# Patient Record
Sex: Male | Born: 1945 | Race: White | Hispanic: No | Marital: Married | Smoking: Former smoker
Health system: Southern US, Community
[De-identification: ages and names within clinical notes are randomized; demographics above are authoritative.]

## PROBLEM LIST (undated history)

## (undated) DIAGNOSIS — G4700 Insomnia, unspecified: Secondary | ICD-10-CM

## (undated) DIAGNOSIS — S43429A Sprain of unspecified rotator cuff capsule, initial encounter: Secondary | ICD-10-CM

## (undated) DIAGNOSIS — J301 Allergic rhinitis due to pollen: Secondary | ICD-10-CM

## (undated) DIAGNOSIS — R143 Flatulence: Secondary | ICD-10-CM

## (undated) DIAGNOSIS — K449 Diaphragmatic hernia without obstruction or gangrene: Secondary | ICD-10-CM

## (undated) DIAGNOSIS — R51 Headache: Secondary | ICD-10-CM

## (undated) DIAGNOSIS — F17200 Nicotine dependence, unspecified, uncomplicated: Secondary | ICD-10-CM

## (undated) DIAGNOSIS — Z0000 Encounter for general adult medical examination without abnormal findings: Secondary | ICD-10-CM

## (undated) DIAGNOSIS — B028 Zoster with other complications: Secondary | ICD-10-CM

## (undated) DIAGNOSIS — R7303 Prediabetes: Secondary | ICD-10-CM

## (undated) DIAGNOSIS — L919 Hypertrophic disorder of the skin, unspecified: Secondary | ICD-10-CM

## (undated) DIAGNOSIS — R21 Rash and other nonspecific skin eruption: Secondary | ICD-10-CM

## (undated) DIAGNOSIS — E875 Hyperkalemia: Secondary | ICD-10-CM

## (undated) DIAGNOSIS — E781 Pure hyperglyceridemia: Secondary | ICD-10-CM

## (undated) DIAGNOSIS — G4730 Sleep apnea, unspecified: Secondary | ICD-10-CM

## (undated) DIAGNOSIS — M545 Low back pain, unspecified: Secondary | ICD-10-CM

## (undated) DIAGNOSIS — R972 Elevated prostate specific antigen [PSA]: Secondary | ICD-10-CM

## (undated) DIAGNOSIS — G4489 Other headache syndrome: Secondary | ICD-10-CM

## (undated) DIAGNOSIS — L909 Atrophic disorder of skin, unspecified: Secondary | ICD-10-CM

## (undated) DIAGNOSIS — I10 Essential (primary) hypertension: Secondary | ICD-10-CM

## (undated) DIAGNOSIS — I8390 Asymptomatic varicose veins of unspecified lower extremity: Secondary | ICD-10-CM

## (undated) DIAGNOSIS — M1990 Unspecified osteoarthritis, unspecified site: Secondary | ICD-10-CM

## (undated) DIAGNOSIS — R351 Nocturia: Secondary | ICD-10-CM

## (undated) DIAGNOSIS — K644 Residual hemorrhoidal skin tags: Secondary | ICD-10-CM

## (undated) DIAGNOSIS — E669 Obesity, unspecified: Secondary | ICD-10-CM

## (undated) DIAGNOSIS — R142 Eructation: Secondary | ICD-10-CM

## (undated) DIAGNOSIS — I70209 Unspecified atherosclerosis of native arteries of extremities, unspecified extremity: Secondary | ICD-10-CM

## (undated) DIAGNOSIS — IMO0001 Reserved for inherently not codable concepts without codable children: Secondary | ICD-10-CM

## (undated) DIAGNOSIS — R7989 Other specified abnormal findings of blood chemistry: Secondary | ICD-10-CM

## (undated) DIAGNOSIS — F40240 Claustrophobia: Secondary | ICD-10-CM

## (undated) DIAGNOSIS — L253 Unspecified contact dermatitis due to other chemical products: Secondary | ICD-10-CM

## (undated) DIAGNOSIS — A159 Respiratory tuberculosis unspecified: Secondary | ICD-10-CM

## (undated) DIAGNOSIS — I714 Abdominal aortic aneurysm, without rupture, unspecified: Secondary | ICD-10-CM

## (undated) DIAGNOSIS — R141 Gas pain: Secondary | ICD-10-CM

## (undated) DIAGNOSIS — F411 Generalized anxiety disorder: Secondary | ICD-10-CM

## (undated) DIAGNOSIS — H269 Unspecified cataract: Secondary | ICD-10-CM

## (undated) HISTORY — DX: Unspecified contact dermatitis due to other chemical products: L25.3

## (undated) HISTORY — DX: Gas pain: R14.1

## (undated) HISTORY — PX: OTHER SURGICAL HISTORY: SHX169

## (undated) HISTORY — DX: Asymptomatic varicose veins of unspecified lower extremity: I83.90

## (undated) HISTORY — DX: Sprain of unspecified rotator cuff capsule, initial encounter: S43.429A

## (undated) HISTORY — DX: Generalized anxiety disorder: F41.1

## (undated) HISTORY — DX: Residual hemorrhoidal skin tags: K64.4

## (undated) HISTORY — DX: Sleep apnea, unspecified: G47.30

## (undated) HISTORY — DX: Unspecified atherosclerosis of native arteries of extremities, unspecified extremity: I70.209

## (undated) HISTORY — DX: Hyperkalemia: E87.5

## (undated) HISTORY — PX: VARICOSE VEIN SURGERY: SHX832

## (undated) HISTORY — DX: Nicotine dependence, unspecified, uncomplicated: F17.200

## (undated) HISTORY — DX: Pure hyperglyceridemia: E78.1

## (undated) HISTORY — DX: Other specified abnormal findings of blood chemistry: R79.89

## (undated) HISTORY — DX: Eructation: R14.2

## (undated) HISTORY — DX: Essential (primary) hypertension: I10

## (undated) HISTORY — DX: Reserved for inherently not codable concepts without codable children: IMO0001

## (undated) HISTORY — DX: Other headache syndrome: G44.89

## (undated) HISTORY — DX: Atrophic disorder of skin, unspecified: L90.9

## (undated) HISTORY — DX: Abdominal aortic aneurysm, without rupture, unspecified: I71.40

## (undated) HISTORY — DX: Encounter for general adult medical examination without abnormal findings: Z00.00

## (undated) HISTORY — DX: Insomnia, unspecified: G47.00

## (undated) HISTORY — PX: HEMORRHOID SURGERY: SHX153

## (undated) HISTORY — DX: Zoster with other complications: B02.8

## (undated) HISTORY — DX: Flatulence: R14.3

## (undated) HISTORY — DX: Allergic rhinitis due to pollen: J30.1

## (undated) HISTORY — DX: Nocturia: R35.1

## (undated) HISTORY — DX: Rash and other nonspecific skin eruption: R21

## (undated) HISTORY — DX: Hypertrophic disorder of the skin, unspecified: L91.9

## (undated) HISTORY — DX: Low back pain: M54.5

## (undated) HISTORY — DX: Headache: R51

## (undated) HISTORY — DX: Obesity, unspecified: E66.9

## (undated) HISTORY — DX: Unspecified osteoarthritis, unspecified site: M19.90

## (undated) HISTORY — DX: Low back pain, unspecified: M54.50

## (undated) HISTORY — DX: Diaphragmatic hernia without obstruction or gangrene: K44.9

## (undated) HISTORY — PX: PROSTATE BIOPSY: SHX241

## (undated) HISTORY — DX: Unspecified cataract: H26.9

---

## 1998-02-12 ENCOUNTER — Emergency Department (HOSPITAL_COMMUNITY): Admission: EM | Admit: 1998-02-12 | Discharge: 1998-02-12 | Payer: Self-pay

## 1998-02-15 ENCOUNTER — Emergency Department (HOSPITAL_COMMUNITY): Admission: EM | Admit: 1998-02-15 | Discharge: 1998-02-15 | Payer: Self-pay | Admitting: Emergency Medicine

## 1998-02-16 ENCOUNTER — Encounter: Admission: RE | Admit: 1998-02-16 | Discharge: 1998-02-16 | Payer: Self-pay | Admitting: *Deleted

## 1998-02-17 ENCOUNTER — Emergency Department (HOSPITAL_COMMUNITY): Admission: EM | Admit: 1998-02-17 | Discharge: 1998-02-17 | Payer: Self-pay | Admitting: Emergency Medicine

## 1998-02-19 ENCOUNTER — Emergency Department (HOSPITAL_COMMUNITY): Admission: EM | Admit: 1998-02-19 | Discharge: 1998-02-19 | Payer: Self-pay | Admitting: Emergency Medicine

## 2005-01-03 ENCOUNTER — Emergency Department (HOSPITAL_COMMUNITY): Admission: EM | Admit: 2005-01-03 | Discharge: 2005-01-03 | Payer: Self-pay | Admitting: Emergency Medicine

## 2005-04-25 ENCOUNTER — Emergency Department (HOSPITAL_COMMUNITY): Admission: EM | Admit: 2005-04-25 | Discharge: 2005-04-25 | Payer: Self-pay | Admitting: Emergency Medicine

## 2008-05-18 ENCOUNTER — Emergency Department: Payer: Self-pay | Admitting: Emergency Medicine

## 2010-08-31 LAB — HM COLONOSCOPY

## 2011-07-26 DIAGNOSIS — E781 Pure hyperglyceridemia: Secondary | ICD-10-CM | POA: Diagnosis not present

## 2011-07-26 DIAGNOSIS — I10 Essential (primary) hypertension: Secondary | ICD-10-CM | POA: Diagnosis not present

## 2011-07-26 DIAGNOSIS — Z125 Encounter for screening for malignant neoplasm of prostate: Secondary | ICD-10-CM | POA: Diagnosis not present

## 2011-07-31 DIAGNOSIS — F411 Generalized anxiety disorder: Secondary | ICD-10-CM | POA: Diagnosis not present

## 2011-07-31 DIAGNOSIS — E875 Hyperkalemia: Secondary | ICD-10-CM | POA: Diagnosis not present

## 2011-07-31 DIAGNOSIS — E781 Pure hyperglyceridemia: Secondary | ICD-10-CM | POA: Diagnosis not present

## 2011-07-31 DIAGNOSIS — R351 Nocturia: Secondary | ICD-10-CM | POA: Diagnosis not present

## 2011-09-06 DIAGNOSIS — H2510 Age-related nuclear cataract, unspecified eye: Secondary | ICD-10-CM | POA: Diagnosis not present

## 2011-09-06 DIAGNOSIS — H5200 Hypermetropia, unspecified eye: Secondary | ICD-10-CM | POA: Diagnosis not present

## 2011-10-26 DIAGNOSIS — I10 Essential (primary) hypertension: Secondary | ICD-10-CM | POA: Diagnosis not present

## 2011-10-26 DIAGNOSIS — E875 Hyperkalemia: Secondary | ICD-10-CM | POA: Diagnosis not present

## 2011-10-26 DIAGNOSIS — E781 Pure hyperglyceridemia: Secondary | ICD-10-CM | POA: Diagnosis not present

## 2011-10-30 DIAGNOSIS — I10 Essential (primary) hypertension: Secondary | ICD-10-CM | POA: Diagnosis not present

## 2011-10-30 DIAGNOSIS — F17200 Nicotine dependence, unspecified, uncomplicated: Secondary | ICD-10-CM | POA: Diagnosis not present

## 2011-10-30 DIAGNOSIS — L919 Hypertrophic disorder of the skin, unspecified: Secondary | ICD-10-CM | POA: Diagnosis not present

## 2011-10-30 DIAGNOSIS — E781 Pure hyperglyceridemia: Secondary | ICD-10-CM | POA: Diagnosis not present

## 2011-10-30 DIAGNOSIS — L909 Atrophic disorder of skin, unspecified: Secondary | ICD-10-CM | POA: Diagnosis not present

## 2012-01-30 DIAGNOSIS — E781 Pure hyperglyceridemia: Secondary | ICD-10-CM | POA: Diagnosis not present

## 2012-01-30 DIAGNOSIS — I10 Essential (primary) hypertension: Secondary | ICD-10-CM | POA: Diagnosis not present

## 2012-02-02 DIAGNOSIS — E781 Pure hyperglyceridemia: Secondary | ICD-10-CM | POA: Diagnosis not present

## 2012-02-02 DIAGNOSIS — I10 Essential (primary) hypertension: Secondary | ICD-10-CM | POA: Diagnosis not present

## 2012-02-02 DIAGNOSIS — IMO0001 Reserved for inherently not codable concepts without codable children: Secondary | ICD-10-CM | POA: Diagnosis not present

## 2012-02-02 DIAGNOSIS — R7989 Other specified abnormal findings of blood chemistry: Secondary | ICD-10-CM | POA: Diagnosis not present

## 2012-05-07 DIAGNOSIS — F17200 Nicotine dependence, unspecified, uncomplicated: Secondary | ICD-10-CM | POA: Diagnosis not present

## 2012-05-07 DIAGNOSIS — Z0000 Encounter for general adult medical examination without abnormal findings: Secondary | ICD-10-CM | POA: Diagnosis not present

## 2012-05-07 DIAGNOSIS — E781 Pure hyperglyceridemia: Secondary | ICD-10-CM | POA: Diagnosis not present

## 2012-05-07 DIAGNOSIS — R7989 Other specified abnormal findings of blood chemistry: Secondary | ICD-10-CM | POA: Diagnosis not present

## 2012-05-07 DIAGNOSIS — E875 Hyperkalemia: Secondary | ICD-10-CM | POA: Diagnosis not present

## 2012-05-07 DIAGNOSIS — I10 Essential (primary) hypertension: Secondary | ICD-10-CM | POA: Diagnosis not present

## 2012-05-09 DIAGNOSIS — E781 Pure hyperglyceridemia: Secondary | ICD-10-CM | POA: Diagnosis not present

## 2012-05-09 DIAGNOSIS — E669 Obesity, unspecified: Secondary | ICD-10-CM | POA: Diagnosis not present

## 2012-05-09 DIAGNOSIS — F411 Generalized anxiety disorder: Secondary | ICD-10-CM | POA: Diagnosis not present

## 2012-05-09 DIAGNOSIS — Z23 Encounter for immunization: Secondary | ICD-10-CM | POA: Diagnosis not present

## 2012-08-01 DIAGNOSIS — I10 Essential (primary) hypertension: Secondary | ICD-10-CM | POA: Diagnosis not present

## 2012-08-01 DIAGNOSIS — R7989 Other specified abnormal findings of blood chemistry: Secondary | ICD-10-CM | POA: Diagnosis not present

## 2012-08-01 DIAGNOSIS — E781 Pure hyperglyceridemia: Secondary | ICD-10-CM | POA: Diagnosis not present

## 2012-08-05 DIAGNOSIS — F17200 Nicotine dependence, unspecified, uncomplicated: Secondary | ICD-10-CM | POA: Diagnosis not present

## 2012-08-05 DIAGNOSIS — I10 Essential (primary) hypertension: Secondary | ICD-10-CM | POA: Diagnosis not present

## 2012-08-05 DIAGNOSIS — G4730 Sleep apnea, unspecified: Secondary | ICD-10-CM | POA: Diagnosis not present

## 2012-08-05 DIAGNOSIS — E781 Pure hyperglyceridemia: Secondary | ICD-10-CM | POA: Diagnosis not present

## 2012-08-19 DIAGNOSIS — M79609 Pain in unspecified limb: Secondary | ICD-10-CM | POA: Diagnosis not present

## 2012-09-06 DIAGNOSIS — H2510 Age-related nuclear cataract, unspecified eye: Secondary | ICD-10-CM | POA: Diagnosis not present

## 2012-09-06 DIAGNOSIS — H524 Presbyopia: Secondary | ICD-10-CM | POA: Diagnosis not present

## 2012-09-06 DIAGNOSIS — H5200 Hypermetropia, unspecified eye: Secondary | ICD-10-CM | POA: Diagnosis not present

## 2012-09-06 DIAGNOSIS — H0289 Other specified disorders of eyelid: Secondary | ICD-10-CM | POA: Diagnosis not present

## 2012-09-09 DIAGNOSIS — I739 Peripheral vascular disease, unspecified: Secondary | ICD-10-CM | POA: Diagnosis not present

## 2012-09-16 DIAGNOSIS — F17200 Nicotine dependence, unspecified, uncomplicated: Secondary | ICD-10-CM | POA: Diagnosis not present

## 2012-09-16 DIAGNOSIS — R7989 Other specified abnormal findings of blood chemistry: Secondary | ICD-10-CM | POA: Diagnosis not present

## 2012-09-16 DIAGNOSIS — E781 Pure hyperglyceridemia: Secondary | ICD-10-CM | POA: Diagnosis not present

## 2012-10-22 ENCOUNTER — Encounter: Payer: Self-pay | Admitting: Internal Medicine

## 2012-10-24 ENCOUNTER — Other Ambulatory Visit: Payer: Self-pay | Admitting: *Deleted

## 2012-10-24 ENCOUNTER — Other Ambulatory Visit: Payer: Medicare Other

## 2012-10-24 DIAGNOSIS — R7989 Other specified abnormal findings of blood chemistry: Secondary | ICD-10-CM

## 2012-10-24 DIAGNOSIS — E538 Deficiency of other specified B group vitamins: Secondary | ICD-10-CM

## 2012-10-24 DIAGNOSIS — E875 Hyperkalemia: Secondary | ICD-10-CM

## 2012-10-24 DIAGNOSIS — I10 Essential (primary) hypertension: Secondary | ICD-10-CM

## 2012-10-24 DIAGNOSIS — IMO0001 Reserved for inherently not codable concepts without codable children: Secondary | ICD-10-CM

## 2012-10-24 DIAGNOSIS — E781 Pure hyperglyceridemia: Secondary | ICD-10-CM

## 2012-10-24 DIAGNOSIS — R5381 Other malaise: Secondary | ICD-10-CM

## 2012-10-25 ENCOUNTER — Encounter: Payer: Self-pay | Admitting: *Deleted

## 2012-10-25 LAB — COMPREHENSIVE METABOLIC PANEL
ALT: 12 IU/L (ref 0–44)
AST: 13 IU/L (ref 0–40)
Albumin/Globulin Ratio: 1.7 (ref 1.1–2.5)
Albumin: 4.3 g/dL (ref 3.6–4.8)
Alkaline Phosphatase: 83 IU/L (ref 39–117)
BUN/Creatinine Ratio: 14 (ref 10–22)
BUN: 11 mg/dL (ref 8–27)
CO2: 22 mmol/L (ref 19–28)
Calcium: 9.6 mg/dL (ref 8.6–10.2)
Chloride: 102 mmol/L (ref 97–108)
Creatinine, Ser: 0.79 mg/dL (ref 0.76–1.27)
GFR calc Af Amer: 107 mL/min/{1.73_m2} (ref >59)
GFR calc non Af Amer: 93 mL/min/{1.73_m2} (ref >59)
Globulin, Total: 2.5 g/dL (ref 1.5–4.5)
Glucose: 112 mg/dL — ABNORMAL HIGH (ref 65–99)
Potassium: 4.5 mmol/L (ref 3.5–5.2)
Sodium: 138 mmol/L (ref 134–144)
Total Bilirubin: 0.4 mg/dL (ref 0.0–1.2)
Total Protein: 6.8 g/dL (ref 6.0–8.5)

## 2012-10-25 LAB — LIPID PANEL
Chol/HDL Ratio: 4.4 ratio units (ref 0.0–5.0)
Cholesterol, Total: 144 mg/dL (ref 100–199)
HDL: 33 mg/dL — ABNORMAL LOW (ref >39)
LDL Calculated: 79 mg/dL (ref 0–99)
Triglycerides: 158 mg/dL — ABNORMAL HIGH (ref 0–149)
VLDL Cholesterol Cal: 32 mg/dL (ref 5–40)

## 2012-10-25 LAB — HEMOGLOBIN A1C
Est. average glucose Bld gHb Est-mCnc: 120 mg/dL
Hgb A1c MFr Bld: 5.8 % — ABNORMAL HIGH (ref 4.8–5.6)

## 2012-10-28 ENCOUNTER — Encounter: Payer: Self-pay | Admitting: Geriatric Medicine

## 2012-10-28 ENCOUNTER — Ambulatory Visit (INDEPENDENT_AMBULATORY_CARE_PROVIDER_SITE_OTHER): Payer: Medicare Other | Admitting: Internal Medicine

## 2012-10-28 ENCOUNTER — Encounter: Payer: Self-pay | Admitting: Internal Medicine

## 2012-10-28 VITALS — BP 118/70 | HR 93 | Temp 97.9°F | Resp 20 | Ht 70.0 in | Wt 222.0 lb

## 2012-10-28 DIAGNOSIS — F17200 Nicotine dependence, unspecified, uncomplicated: Secondary | ICD-10-CM | POA: Diagnosis not present

## 2012-10-28 DIAGNOSIS — E782 Mixed hyperlipidemia: Secondary | ICD-10-CM | POA: Insufficient documentation

## 2012-10-28 DIAGNOSIS — M19049 Primary osteoarthritis, unspecified hand: Secondary | ICD-10-CM | POA: Diagnosis not present

## 2012-10-28 DIAGNOSIS — I10 Essential (primary) hypertension: Secondary | ICD-10-CM | POA: Diagnosis not present

## 2012-10-28 DIAGNOSIS — R7309 Other abnormal glucose: Secondary | ICD-10-CM

## 2012-10-28 DIAGNOSIS — E785 Hyperlipidemia, unspecified: Secondary | ICD-10-CM | POA: Diagnosis not present

## 2012-10-28 DIAGNOSIS — M189 Osteoarthritis of first carpometacarpal joint, unspecified: Secondary | ICD-10-CM

## 2012-10-28 DIAGNOSIS — E669 Obesity, unspecified: Secondary | ICD-10-CM

## 2012-10-28 DIAGNOSIS — R739 Hyperglycemia, unspecified: Secondary | ICD-10-CM

## 2012-10-28 NOTE — Progress Notes (Signed)
Patient ID: David [name], male   DOB: Sep 12, 1945, 67 y.o.   MRN: [id number] Code Status: full code  Allergies  Allergen Reactions  . Celebrex (Celecoxib)   . Erythromycin   . Oxycontin (Oxycodone Hcl)   . Penicillins   . Tetracyclines & Related     Chief Complaint  Patient presents with  . follow up on medication changes    HPI: Patient is a 67 y.o. white male seen in the office today for medication f/u.  Is doing ok as far as he knows.    Still smoking.  Wants to quit faster.  Previously tried patches.  Using bupropion, but not helping much.  Is worried about trying chantix b/c his mood is not so good as it is.    Now on statin in place of triglyceride med.  Tolerating ok.  TG med was too expensive.  Left thumb joint acting up.  Cracks joints of hands frequently--advised against.  Discussed using heat and ice alternating and stretching.  Has had allergy to celebrex previously, so recommended he mostly use tylenol ES for pain.    Says will increase activity due to mowing now.  Encouraged walking.    Vision unchanged and glasses have not needed to be changed.   Just went last month.  Review of Systems:  Review of Systems  Constitutional: Negative for weight loss.       Gaining weight  Eyes: Negative for blurred vision.  Respiratory: Negative for shortness of breath.   Cardiovascular: Negative for chest pain.  Gastrointestinal: Negative for constipation.  Musculoskeletal: Positive for joint pain. Negative for falls.  Neurological: Negative for dizziness and headaches.  Psychiatric/Behavioral: Positive for depression. Negative for memory loss. The patient is nervous/anxious.      Past Medical History  Diagnosis Date  . Atherosclerosis of native arteries of the extremities, unspecified   . Myalgia and myositis, unspecified   . Unspecified hypertrophic and atrophic condition of skin   . Hyperpotassemia   . Nocturia   . Rotator cuff (capsule) sprain   . Anxiety state,  unspecified   . Contact dermatitis and other eczema due to other chemical products   . Insomnia, unspecified   . Herpes zoster with unspecified complication   . Lumbago   . Lumbago   . Pure hyperglyceridemia   . Other abnormal blood chemistry   . Obesity, unspecified   . Tobacco use disorder   . Other headache syndromes   . Unspecified essential hypertension   . Asymptomatic varicose veins   . Hemorrhoids, external   . Allergic rhinitis due to pollen   . 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 Osteoarthrosis, unspecified whether generalized or localized, unspecified site   . Unspecified sleep apnea   . Rash and other nonspecific skin eruption   . Headache   . Flatulence, eructation, and gas pain   . Routine general medical examination at a health care facility    No past surgical history on file. Social History:   reports that he has been smoking Cigarettes.  He has a 55 pack-year smoking history. He does not have any smokeless tobacco history on file. His alcohol and drug histories are not on file.  Family History  Problem Relation Age of Onset  . Diabetes Mother   . Heart disease Mother   . Stroke Mother   . Stroke Father     Medications: Patient's Medications  New Prescriptions   No medications on file  Previous Medications   BUPROPION (WELLBUTRIN XL) 150 MG 24 HR TABLET    Take 150 mg by mouth daily. Take one tablet twice a day for smoking cessation   MULTIPLE VITAMINS-MINERALS (MENS DAILY FORMULA/LYCOPENE) CAPS    Take by mouth. Take one tablet once a day   SIMVASTATIN (ZOCOR) 10 MG TABLET    Take 10 mg by mouth at bedtime. Take one tablet once a day for cholesterol  Modified Medications   No medications on file  Discontinued Medications   No medications on file   Physical Exam:  Filed Vitals:   10/28/12 1534  BP: 118/70  Pulse: 93  Temp: 97.9 F (36.6 C)  TempSrc: Oral  Resp: 20  Height: 5\' 10"  (1.778 m)  Weight: 222 lb  (100.699 kg)  SpO2: 95%  Physical Exam  Constitutional: He is oriented to person, place, and time. He appears well-developed and well-nourished. No distress.  Overweight white male, chronically diaphoretic  HENT:  Head: Normocephalic and atraumatic.  Eyes: EOM are normal. Pupils are equal, round, and reactive to light.  Cardiovascular: Normal rate, regular rhythm, normal heart sounds and intact distal pulses.   Pulmonary/Chest: Effort normal. No respiratory distress.  Some scattered rhonchi  Abdominal: Soft. Bowel sounds are normal. He exhibits no distension.  Musculoskeletal: Normal range of motion. He exhibits tenderness. He exhibits no edema.  Of thumb joints  Neurological: He is alert and oriented to person, place, and time. No cranial nerve deficit.  Skin: Skin is warm and dry.       Labs reviewed: Basic Metabolic Panel:  Recent Labs  16/10/96 1226  NA 138  K 4.5  CL 102  CO2 22  GLUCOSE 112*  BUN 11  CREATININE 0.79  CALCIUM 9.6   Liver Function Tests:  Recent Labs  10/24/12 1226  AST 13  ALT 12  ALKPHOS 83  BILITOT 0.4  PROT 6.8   No results found for this basename: LIPASE, AMYLASE,  in the last 8760 hours No results found for this basename: AMMONIA,  in the last 8760 hours CBC: No results found for this basename: WBC, NEUTROABS, HGB, HCT, MCV, PLT,  in the last 8760 hours Lipid Panel:  Recent Labs  10/24/12 1226  HDL 33*  LDLCALC 79  TRIG 045*  CHOLHDL 4.4   Assessment/Plan 1. Hyperlipidemia LDL goal < 100 -need to f/u lab due to change to statin from fenofibrate due to cost - Lipid panel; Future  2. Tobacco use disorder --encouraged slow taper off cigarettes, afraid of chantix and wellbutrin is not helping  3. Essential hypertension, benign - Basic metabolic panel; Future - goal is <140/90  4. Osteoarthritis of basilar joint of thumb -see hpi for plan -if not getting better, could refer to ortho or rheumatology for injection in small  joints, but I cannot inject joints this small  5. Obesity, unspecified -diet, exercising discussed in great detail due to weight gain and high cholesterol - CBC with Differential; Future - Hemoglobin A1c; Future  6. Hyperglycemia -noted on fasting glucose levels - Hemoglobin A1c; Future  Labs/tests ordered:  Cbc, hba1c, flp, bmp

## 2012-10-28 NOTE — Patient Instructions (Signed)
Walk 20 mins on all days that end in day.   :)

## 2012-11-25 ENCOUNTER — Other Ambulatory Visit: Payer: Self-pay

## 2012-12-03 ENCOUNTER — Other Ambulatory Visit: Payer: Self-pay | Admitting: Internal Medicine

## 2013-01-12 ENCOUNTER — Other Ambulatory Visit: Payer: Self-pay | Admitting: Internal Medicine

## 2013-01-28 ENCOUNTER — Other Ambulatory Visit: Payer: Self-pay | Admitting: Internal Medicine

## 2013-01-28 ENCOUNTER — Other Ambulatory Visit: Payer: Medicare Other

## 2013-01-28 DIAGNOSIS — R7989 Other specified abnormal findings of blood chemistry: Secondary | ICD-10-CM | POA: Diagnosis not present

## 2013-01-28 DIAGNOSIS — E669 Obesity, unspecified: Secondary | ICD-10-CM | POA: Diagnosis not present

## 2013-01-28 DIAGNOSIS — E781 Pure hyperglyceridemia: Secondary | ICD-10-CM | POA: Diagnosis not present

## 2013-01-28 DIAGNOSIS — E875 Hyperkalemia: Secondary | ICD-10-CM | POA: Diagnosis not present

## 2013-01-31 ENCOUNTER — Ambulatory Visit (INDEPENDENT_AMBULATORY_CARE_PROVIDER_SITE_OTHER): Payer: Medicare Other | Admitting: Internal Medicine

## 2013-01-31 ENCOUNTER — Encounter: Payer: Self-pay | Admitting: Internal Medicine

## 2013-01-31 VITALS — BP 144/86 | HR 91 | Temp 98.3°F | Resp 14 | Ht 70.0 in | Wt 222.2 lb

## 2013-01-31 DIAGNOSIS — J309 Allergic rhinitis, unspecified: Secondary | ICD-10-CM

## 2013-01-31 DIAGNOSIS — H1010 Acute atopic conjunctivitis, unspecified eye: Secondary | ICD-10-CM | POA: Diagnosis not present

## 2013-01-31 DIAGNOSIS — M7581 Other shoulder lesions, right shoulder: Secondary | ICD-10-CM

## 2013-01-31 DIAGNOSIS — M67919 Unspecified disorder of synovium and tendon, unspecified shoulder: Secondary | ICD-10-CM

## 2013-01-31 DIAGNOSIS — E785 Hyperlipidemia, unspecified: Secondary | ICD-10-CM

## 2013-01-31 DIAGNOSIS — I10 Essential (primary) hypertension: Secondary | ICD-10-CM | POA: Diagnosis not present

## 2013-01-31 DIAGNOSIS — F17200 Nicotine dependence, unspecified, uncomplicated: Secondary | ICD-10-CM | POA: Diagnosis not present

## 2013-01-31 DIAGNOSIS — H1013 Acute atopic conjunctivitis, bilateral: Secondary | ICD-10-CM

## 2013-01-31 DIAGNOSIS — R7309 Other abnormal glucose: Secondary | ICD-10-CM

## 2013-01-31 DIAGNOSIS — E669 Obesity, unspecified: Secondary | ICD-10-CM

## 2013-01-31 DIAGNOSIS — R739 Hyperglycemia, unspecified: Secondary | ICD-10-CM

## 2013-01-31 MED ORDER — CETIRIZINE HCL 10 MG PO TABS: 10.0000 mg | ORAL_TABLET | Freq: Every day | ORAL | Status: DC

## 2013-01-31 MED ORDER — OLOPATADINE HCL 0.2 % OP SOLN: 1.0000 [drp] | Freq: Every day | OPHTHALMIC | Status: DC

## 2013-01-31 NOTE — Patient Instructions (Addendum)
Please call us with clarification of your wellbutrin/bupropion dose and timing.    I gave you drops for your allergies and an allergy pill today sent to your pharmacy.  I also gave you 4 sample pills of skelaxin, a muscle relaxant for your right shoulder pain.  You may also apply ice to the shoulder or topical over the counter agents like icy hot to help with the pain.

## 2013-01-31 NOTE — Progress Notes (Signed)
Patient ID: David [name], male   DOB: [birth year], 67 y.o.   MRN: [id number] Location:  [facility]  Code Status: full code  Allergies  Allergen Reactions  . Celebrex (Celecoxib)   . Erythromycin   . Oxycontin (Oxycodone Hcl)   . Penicillins   . Tetracyclines & Related     Chief Complaint  Patient presents with  . Medical Managment of Chronic Issues    HPI: Patient is a 67 y.o. white male seen in the office today for mixed hyperlipidemia, obesity, anxiety and depression.   Left eye bothering him something fierce.   Right shoulder is very painful.    Review of Systems:  Review of Systems  Constitutional: Positive for diaphoresis. Negative for fever, chills, weight loss and malaise/fatigue.  HENT: Negative for congestion.        Rhinitis  Eyes: Positive for pain, discharge and redness. Negative for blurred vision, double vision and photophobia.       Extreme pruritus of eyes with clear discharge  Respiratory: Negative for shortness of breath and wheezing.   Cardiovascular: Negative for chest pain, palpitations and leg swelling.  Gastrointestinal: Negative for heartburn.  Genitourinary: Negative for dysuria.  Musculoskeletal: Positive for joint pain. Negative for falls.       Right shoulder pain with abduction  Skin: Negative for rash.  Neurological: Negative for dizziness and headaches.  Endo/Heme/Allergies: Does not bruise/bleed easily.  Psychiatric/Behavioral: Negative for depression and memory loss. The patient does not have insomnia.      Past Medical History  Diagnosis Date  . Atherosclerosis of native arteries of the extremities, unspecified   . Myalgia and myositis, unspecified   . Unspecified hypertrophic and atrophic condition of skin   . Hyperpotassemia   . Nocturia   . Rotator cuff (capsule) sprain   . Anxiety state, unspecified   . Contact dermatitis and other eczema due to other chemical products   .  Insomnia, unspecified   . Herpes zoster with unspecified complication   . Lumbago   . Lumbago   . Pure hyperglyceridemia   . Other abnormal blood chemistry   . Obesity, unspecified   . Tobacco use disorder   . Other headache syndromes(339.89)   . Unspecified essential hypertension   . Asymptomatic varicose veins   . Hemorrhoids, external   . Allergic rhinitis due to pollen   . 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 Osteoarthrosis, unspecified whether generalized or localized, unspecified site   . Unspecified sleep apnea   . Rash and other nonspecific skin eruption   . Headache(784.0)   . Flatulence, eructation, and gas pain   . Routine general medical examination at a health care facility     History reviewed. No pertinent past surgical history.  Social History:   reports that he has been smoking Cigarettes.  He has a 55 pack-year smoking history. He does not have any smokeless tobacco history on file. His alcohol and drug histories are not on file.  Family History  Problem Relation Age of Onset  . Diabetes Mother   . Heart disease Mother   . Stroke Mother   . Stroke Father     Medications: Patient's Medications  New Prescriptions   CETIRIZINE (ZYRTEC) 10 MG TABLET    Take 1 tablet (10 mg total) by mouth daily.   OLOPATADINE HCL (PATADAY) 0.2 % SOLN    Apply 1 drop to eye daily.  Previous Medications  BUPROPION (WELLBUTRIN XL) 150 MG 24 HR TABLET    Take 150 mg by mouth daily. Take one tablet twice a day for smoking cessation   BUPROPION (WELLBUTRIN) 75 MG TABLET    TAKE 2TABLETS BY MOUTH EVERY DAY FOR SMOKING CESSATION   MULTIPLE VITAMINS-MINERALS (MENS DAILY FORMULA/LYCOPENE) CAPS    Take by mouth. Take one tablet once a day   SIMVASTATIN (ZOCOR) 10 MG TABLET    TAKE 1 TABLET AT BEDTIME FOR CHLOESTEROL  Modified Medications   No medications on file  Discontinued Medications   No medications on file     Physical Exam: Filed Vitals:    01/31/13 0839  BP: 144/86  Pulse: 91  Temp: 98.3 F (36.8 C)  TempSrc: Oral  Resp: 14  Height: 5\' 10"  (1.778 m)  Weight: 222 lb 3.2 oz (100.789 kg)  Physical Exam  Constitutional: He is oriented to person, place, and time. He appears well-developed and well-nourished.  HENT:  Head: Normocephalic and atraumatic.  Eyes: Pupils are equal, round, and reactive to light.  Erythematous eyes with clear drainage, face is red  Cardiovascular: Normal rate, regular rhythm, normal heart sounds and intact distal pulses.   Pulmonary/Chest: Effort normal and breath sounds normal.  Abdominal: Soft. Bowel sounds are normal.  Musculoskeletal:  Right shoulder rotator cuff tenderness present anteriorly and laterally, negative drop arm test,   Neurological: He is alert and oriented to person, place, and time. No cranial nerve deficit.  Skin: There is erythema.    Labs reviewed: Basic Metabolic Panel:  Recent Labs  16/10/96 1226  NA 138  K 4.5  CL 102  CO2 22  GLUCOSE 112*  BUN 11  CREATININE 0.79  CALCIUM 9.6   Liver Function Tests:  Recent Labs  10/24/12 1226  AST 13  ALT 12  ALKPHOS 83  BILITOT 0.4  PROT 6.8  Lipid Panel:  Recent Labs  10/24/12 1226  HDL 33*  LDLCALC 79  TRIG 045*  CHOLHDL 4.4   Lab Results  Component Value Date   HGBA1C 5.8* 10/24/2012   Assessment/Plan Hyperlipidemia LDL goal < 100 LDL improved with zocor--is at goal.  Is off fenofibrate due to cost though his triglycerides have remained a bigger problem.  Has been counseled extensively about low starch diet, but does not seem to make changes.  Advised increased grilled fish and fish oil to help with HDL level.  Tobacco use disorder Persists.  Is not ready to quit, but has no money for medications  Essential hypertension, benign Above goal, but continues smoking and cannot afford medications.    Obesity, unspecified Encouraged exercise and low fat and low cholesterol diet.    Hyperglycemia:   F/u hba1c before next visit.  Has improved.  Allergic conjunctivitis and rhinitis:  Given script for pataday drops and zyrtec for allergies.  Call back Monday if not improving to be seen again.  Labs/tests ordered:  Cbc, cmp, flp, hba1c before Next appt:  4 mos

## 2013-01-31 NOTE — Assessment & Plan Note (Signed)
Above goal, but continues smoking and cannot afford medications.

## 2013-01-31 NOTE — Assessment & Plan Note (Signed)
Encouraged exercise and low fat and low cholesterol diet.

## 2013-01-31 NOTE — Assessment & Plan Note (Addendum)
LDL improved with zocor--is at goal.  Is off fenofibrate due to cost though his triglycerides have remained a bigger problem.  Has been counseled extensively about low starch diet, but does not seem to make changes.  Advised increased grilled fish and fish oil to help with HDL level.

## 2013-01-31 NOTE — Assessment & Plan Note (Signed)
Persists.  Is not ready to quit, but has no money for medications

## 2013-01-31 NOTE — Addendum Note (Signed)
Addended by: Conception Chancy R on: 01/31/2013 12:10 PM   Modules accepted: Orders

## 2013-02-07 LAB — CBC WITH DIFFERENTIAL
Basophils Absolute: 0.1 10*3/uL (ref 0.0–0.2)
Basos: 1 % (ref 0–3)
Eos: 2 % (ref 0–5)
Eosinophils Absolute: 0.2 10*3/uL (ref 0.0–0.4)
HCT: 47.3 % (ref 37.5–51.0)
Hemoglobin: 16.1 g/dL (ref 12.6–17.7)
Immature Grans (Abs): 0 10*3/uL (ref 0.0–0.1)
Immature Granulocytes: 0 % (ref 0–2)
Lymphocytes Absolute: 1.8 10*3/uL (ref 0.7–3.1)
Lymphs: 27 % (ref 14–46)
MCH: 32.1 pg (ref 26.6–33.0)
MCHC: 34 g/dL (ref 31.5–35.7)
MCV: 94 fL (ref 79–97)
Monocytes Absolute: 0.8 10*3/uL (ref 0.1–0.9)
Monocytes: 12 % (ref 4–12)
Neutrophils Absolute: 3.8 10*3/uL (ref 1.4–7.0)
Neutrophils Relative %: 58 % (ref 40–74)
Platelets: 253 10*3/uL (ref 150–379)
RBC: 5.02 x10E6/uL (ref 4.14–5.80)
RDW: 13.4 % (ref 12.3–15.4)
WBC: 6.6 10*3/uL (ref 3.4–10.8)

## 2013-02-07 LAB — BASIC METABOLIC PANEL
BUN/Creatinine Ratio: 19 (ref 10–22)
BUN: 13 mg/dL (ref 8–27)
CO2: 19 mmol/L (ref 18–29)
Calcium: 9.4 mg/dL (ref 8.6–10.2)
Chloride: 102 mmol/L (ref 97–108)
Creatinine, Ser: 0.7 mg/dL — ABNORMAL LOW (ref 0.76–1.27)
GFR calc Af Amer: 113 mL/min/{1.73_m2} (ref >59)
GFR calc non Af Amer: 98 mL/min/{1.73_m2} (ref >59)
Glucose: 115 mg/dL — ABNORMAL HIGH (ref 65–99)
Potassium: 4.3 mmol/L (ref 3.5–5.2)
Sodium: 138 mmol/L (ref 134–144)

## 2013-02-07 LAB — HGB A1C W/O EAG: Hgb A1c MFr Bld: 5.9 % — ABNORMAL HIGH (ref 4.8–5.6)

## 2013-02-07 LAB — LIPID PANEL WITH LDL/HDL RATIO
Cholesterol, Total: 141 mg/dL (ref 100–199)
HDL: 31 mg/dL — ABNORMAL LOW (ref >39)
LDL Calculated: 54 mg/dL (ref 0–99)
LDl/HDL Ratio: 1.7 ratio units (ref 0.0–3.6)
Triglycerides: 280 mg/dL — ABNORMAL HIGH (ref 0–149)
VLDL Cholesterol Cal: 56 mg/dL — ABNORMAL HIGH (ref 5–40)

## 2013-05-03 ENCOUNTER — Other Ambulatory Visit: Payer: Self-pay | Admitting: Internal Medicine

## 2013-05-27 ENCOUNTER — Other Ambulatory Visit: Payer: Medicare Other

## 2013-05-27 DIAGNOSIS — F17200 Nicotine dependence, unspecified, uncomplicated: Secondary | ICD-10-CM | POA: Diagnosis not present

## 2013-05-27 DIAGNOSIS — E669 Obesity, unspecified: Secondary | ICD-10-CM

## 2013-05-27 DIAGNOSIS — E785 Hyperlipidemia, unspecified: Secondary | ICD-10-CM | POA: Diagnosis not present

## 2013-05-27 DIAGNOSIS — R739 Hyperglycemia, unspecified: Secondary | ICD-10-CM

## 2013-05-27 DIAGNOSIS — R7309 Other abnormal glucose: Secondary | ICD-10-CM | POA: Diagnosis not present

## 2013-05-27 DIAGNOSIS — I10 Essential (primary) hypertension: Secondary | ICD-10-CM | POA: Diagnosis not present

## 2013-05-28 LAB — COMPREHENSIVE METABOLIC PANEL
ALT: 15 IU/L (ref 0–44)
AST: 15 IU/L (ref 0–40)
Albumin/Globulin Ratio: 1.7 (ref 1.1–2.5)
Albumin: 4.3 g/dL (ref 3.6–4.8)
Alkaline Phosphatase: 76 IU/L (ref 39–117)
BUN/Creatinine Ratio: 14 (ref 10–22)
BUN: 12 mg/dL (ref 8–27)
CO2: 23 mmol/L (ref 18–29)
Calcium: 9.3 mg/dL (ref 8.6–10.2)
Chloride: 103 mmol/L (ref 97–108)
Creatinine, Ser: 0.85 mg/dL (ref 0.76–1.27)
GFR calc Af Amer: 104 mL/min/{1.73_m2} (ref >59)
GFR calc non Af Amer: 90 mL/min/{1.73_m2} (ref >59)
Globulin, Total: 2.6 g/dL (ref 1.5–4.5)
Glucose: 115 mg/dL — ABNORMAL HIGH (ref 65–99)
Potassium: 4.4 mmol/L (ref 3.5–5.2)
Sodium: 140 mmol/L (ref 134–144)
Total Bilirubin: 0.4 mg/dL (ref 0.0–1.2)
Total Protein: 6.9 g/dL (ref 6.0–8.5)

## 2013-05-28 LAB — CBC WITH DIFFERENTIAL/PLATELET
Basophils Absolute: 0.1 10*3/uL (ref 0.0–0.2)
Basos: 1 %
Eos: 2 %
Eosinophils Absolute: 0.2 10*3/uL (ref 0.0–0.4)
HCT: 48 % (ref 37.5–51.0)
Hemoglobin: 16.2 g/dL (ref 12.6–17.7)
Immature Grans (Abs): 0 10*3/uL (ref 0.0–0.1)
Immature Granulocytes: 0 %
Lymphocytes Absolute: 2.2 10*3/uL (ref 0.7–3.1)
Lymphs: 32 %
MCH: 31.8 pg (ref 26.6–33.0)
MCHC: 33.8 g/dL (ref 31.5–35.7)
MCV: 94 fL (ref 79–97)
Monocytes Absolute: 0.7 10*3/uL (ref 0.1–0.9)
Monocytes: 10 %
Neutrophils Absolute: 3.8 10*3/uL (ref 1.4–7.0)
Neutrophils Relative %: 55 %
RBC: 5.1 x10E6/uL (ref 4.14–5.80)
RDW: 13.6 % (ref 12.3–15.4)
WBC: 6.8 10*3/uL (ref 3.4–10.8)

## 2013-05-28 LAB — LIPID PANEL
Chol/HDL Ratio: 4.3 ratio units (ref 0.0–5.0)
Cholesterol, Total: 143 mg/dL (ref 100–199)
HDL: 33 mg/dL — ABNORMAL LOW (ref >39)
LDL Calculated: 78 mg/dL (ref 0–99)
Triglycerides: 160 mg/dL — ABNORMAL HIGH (ref 0–149)
VLDL Cholesterol Cal: 32 mg/dL (ref 5–40)

## 2013-05-28 LAB — HEMOGLOBIN A1C
Est. average glucose Bld gHb Est-mCnc: 123 mg/dL
Hgb A1c MFr Bld: 5.9 % — ABNORMAL HIGH (ref 4.8–5.6)

## 2013-05-29 ENCOUNTER — Ambulatory Visit: Payer: Medicare Other | Admitting: Internal Medicine

## 2013-06-02 ENCOUNTER — Encounter: Payer: Self-pay | Admitting: *Deleted

## 2013-06-13 ENCOUNTER — Ambulatory Visit (INDEPENDENT_AMBULATORY_CARE_PROVIDER_SITE_OTHER): Payer: Medicare Other | Admitting: Internal Medicine

## 2013-06-13 ENCOUNTER — Encounter: Payer: Self-pay | Admitting: Internal Medicine

## 2013-06-13 VITALS — BP 156/92 | HR 84 | Resp 12 | Ht 69.5 in | Wt 230.0 lb

## 2013-06-13 DIAGNOSIS — R7309 Other abnormal glucose: Secondary | ICD-10-CM

## 2013-06-13 DIAGNOSIS — F17200 Nicotine dependence, unspecified, uncomplicated: Secondary | ICD-10-CM | POA: Diagnosis not present

## 2013-06-13 DIAGNOSIS — Z23 Encounter for immunization: Secondary | ICD-10-CM

## 2013-06-13 DIAGNOSIS — E785 Hyperlipidemia, unspecified: Secondary | ICD-10-CM

## 2013-06-13 DIAGNOSIS — F411 Generalized anxiety disorder: Secondary | ICD-10-CM

## 2013-06-13 DIAGNOSIS — R739 Hyperglycemia, unspecified: Secondary | ICD-10-CM

## 2013-06-13 DIAGNOSIS — I10 Essential (primary) hypertension: Secondary | ICD-10-CM

## 2013-06-13 DIAGNOSIS — E669 Obesity, unspecified: Secondary | ICD-10-CM

## 2013-06-13 MED ORDER — ESCITALOPRAM OXALATE 10 MG PO TABS: 10.0000 mg | ORAL_TABLET | Freq: Every day | ORAL | Status: DC

## 2013-06-13 MED ORDER — TETANUS-DIPHTH-ACELL PERTUSSIS 5-2.5-18.5 LF-MCG/0.5 IM SUSP: 0.5000 mL | Freq: Once | INTRAMUSCULAR | Status: DC

## 2013-06-13 NOTE — Progress Notes (Signed)
Patient ID: David [name], male   DOB: 1946-03-25, 67 y.o.   MRN: [id number]   Location:  [facility]  Allergies  Allergen Reactions  . Celebrex [Celecoxib]   . Erythromycin   . Oxycontin [Oxycodone Hcl]   . Penicillins   . Tetracyclines & Related     Chief Complaint  Patient presents with  . Medical Managment of Chronic Issues    4 month follow-up   . Medication Management    Discuss Wellbutrin- not working     HPI: Patient is a 67 y.o. white male seen in the office today for medical management of his chronic diseases.  His primary concern today is that wellbutrin no longer works for him.  His bp remains elevated again this am.  He continues to smoke a ppd.  He says he rushed here b/c he forgot his glasses and had to turn back around to pick them up.    Feeling pretty well so far.    Still has shooting pain down into his foot that feels almost as if he stepped on a nail.  Worries that he is getting senile.    Nerves have been worst.  Does not feel depressed, but quite anxious.  Has slight tremor.  Does not think he has ever taken lexapro but not sure.  Zoloft and wellbutrin have both stopped working.     Review of Systems:  Review of Systems  Constitutional: Negative for fever and chills.  Eyes: Negative for blurred vision.  Respiratory: Negative for cough and shortness of breath.   Cardiovascular: Negative for chest pain.  Gastrointestinal: Negative for constipation.  Genitourinary: Negative for dysuria, urgency and frequency.  Musculoskeletal:       Right leg pain  Neurological: Positive for tingling. Negative for dizziness and headaches.  Psychiatric/Behavioral: Negative for memory loss. The patient is nervous/anxious.     Past Medical History  Diagnosis Date  . Atherosclerosis of native arteries of the extremities, unspecified   . Myalgia and myositis, unspecified   . Unspecified hypertrophic and atrophic condition of  skin   . Hyperpotassemia   . Nocturia   . Rotator cuff (capsule) sprain   . Anxiety state, unspecified   . Contact dermatitis and other eczema due to other chemical products   . Insomnia, unspecified   . Herpes zoster with unspecified complication   . Lumbago   . Lumbago   . Pure hyperglyceridemia   . Other abnormal blood chemistry   . Obesity, unspecified   . Tobacco use disorder   . Other headache syndromes(339.89)   . Unspecified essential hypertension   . Asymptomatic varicose veins   . Hemorrhoids, external   . Allergic rhinitis due to pollen   . 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 Osteoarthrosis, unspecified whether generalized or localized, unspecified site   . Unspecified sleep apnea   . Rash and other nonspecific skin eruption   . Headache(784.0)   . Flatulence, eructation, and gas pain   . Routine general medical examination at a health care facility     History reviewed. No pertinent past surgical history.  Social History:   reports that he has been smoking Cigarettes.  He has a 55 pack-year smoking history. He does not have any smokeless tobacco history on file. He reports that he does not drink alcohol or use illicit drugs.  Family History  Problem Relation Age of Onset  . Diabetes Mother   .  Heart disease Mother   . Stroke Mother   . Stroke Father     Medications: Patient's Medications  New Prescriptions   No medications on file  Previous Medications   BUPROPION (WELLBUTRIN) 75 MG TABLET    TAKE 2TABLETS BY MOUTH EVERY DAY FOR SMOKING CESSATION   CETIRIZINE (ZYRTEC) 10 MG TABLET    Take 1 tablet (10 mg total) by mouth daily.   OLOPATADINE HCL (PATADAY) 0.2 % SOLN    Apply 1 drop to eye daily.   SIMVASTATIN (ZOCOR) 10 MG TABLET    TAKE 1 TABLET AT BEDTIME FOR CHLOESTEROL  Modified Medications   No medications on file  Discontinued Medications   MULTIPLE VITAMINS-MINERALS (MENS DAILY FORMULA/LYCOPENE) CAPS    Take by  mouth. Take one tablet once a day     Physical Exam: Filed Vitals:   06/13/13 0821  BP: 156/92  Pulse: 84  Resp: 12  Height: 5' 9.5" (1.765 m)  Weight: 230 lb (104.327 kg)  Physical Exam  Constitutional: He is oriented to person, place, and time. He appears well-developed and well-nourished. No distress.  HENT:  Head: Normocephalic and atraumatic.  Eyes:  glasses  Cardiovascular: Normal rate, regular rhythm, normal heart sounds and intact distal pulses.   Pulmonary/Chest: Effort normal and breath sounds normal. No respiratory distress.  Abdominal: Soft. Bowel sounds are normal. He exhibits no distension. There is no tenderness.  Abdominal obesity  Musculoskeletal: Normal range of motion. He exhibits no edema and no tenderness.  Neurological: He is alert and oriented to person, place, and time. No cranial nerve deficit.  Skin: Skin is warm and dry.     Labs reviewed: Basic Metabolic Panel:  Recent Labs  95/62/13 1226 01/28/13 0847 05/27/13 0823  NA 138 138 140  K 4.5 4.3 4.4  CL 102 102 103  CO2 22 19 23   GLUCOSE 112* 115* 115*  BUN 11 13 12   CREATININE 0.79 0.70* 0.85  CALCIUM 9.6 9.4 9.3   Liver Function Tests:  Recent Labs  10/24/12 1226 05/27/13 0823  AST 13 15  ALT 12 15  ALKPHOS 83 76  BILITOT 0.4 0.4  PROT 6.8 6.9   CBC:  Recent Labs  01/28/13 0847 05/27/13 0823  WBC 6.6 6.8  NEUTROABS 3.8 3.8  HGB 16.1 16.2  HCT 47.3 48.0  MCV 94 94  PLT 253  --    Lipid Panel:  Recent Labs  10/24/12 1226 01/28/13 0847 05/27/13 0823  HDL 33* 31* 33*  LDLCALC 79 54 78  TRIG 158* 280* 160*  CHOLHDL 4.4  --  4.3   Lab Results  Component Value Date   HGBA1C 5.9* 05/27/2013    Assessment/Plan  1. Generalized anxiety disorder -has not responded adequately to zoloft or bupropion -will try lexapro - escitalopram (LEXAPRO) 10 MG tablet; Take 1 tablet (10 mg total) by mouth daily.  Dispense: 30 tablet; Refill: 3  2. Hyperlipidemia LDL goal <  100 -cont statin therapy--will not increase b/c I don't think he will get further tg reduction--encouraged decreased starchy food intake - Lipid panel; Future - Comprehensive metabolic panel; Future to f/u transaminases with statin therapy  3. Tobacco use disorder - CBC with Differential; Future -smoking cessation encouraged, but pt's anxiety is poorly controlled--explained that the nicotine only worsens anxiety, tachycardia, and hypertension, but he is not ready to quit -has not been able to quit with bupropion therapy either  4. Essential hypertension, benign -bp elevated this am--needs to quit smoking and exercise  5. Hyperglycemia - Hemoglobin A1c; Future -emphasized need to lose weight especially midabdominal by watching starches--is eating potatoes at every meal, also eats beef or [place] sausage with eggs frequently --discussed cutting back on volume of these, as well  6. Obesity, unspecified - Comprehensive metabolic panel; Future -discussed low carb diet--previously given a copy of this--emphasized cutting back on potatoes now.  7. Need for prophylactic vaccination and inoculation against influenza -flu shot given today  -cannot get prevnar after pneumococcal (had at 64) until medicare will cover this  Labs/tests ordered:  Cbc, cmp, flp, hba1c before next visit Next appt:  3 mos

## 2013-07-04 ENCOUNTER — Encounter: Payer: Self-pay | Admitting: Internal Medicine

## 2013-08-05 ENCOUNTER — Other Ambulatory Visit: Payer: Self-pay | Admitting: Internal Medicine

## 2013-09-05 DIAGNOSIS — H2510 Age-related nuclear cataract, unspecified eye: Secondary | ICD-10-CM | POA: Diagnosis not present

## 2013-09-05 DIAGNOSIS — H0289 Other specified disorders of eyelid: Secondary | ICD-10-CM | POA: Diagnosis not present

## 2013-09-05 DIAGNOSIS — H5200 Hypermetropia, unspecified eye: Secondary | ICD-10-CM | POA: Diagnosis not present

## 2013-09-09 ENCOUNTER — Other Ambulatory Visit: Payer: Medicare Other

## 2013-09-09 DIAGNOSIS — E785 Hyperlipidemia, unspecified: Secondary | ICD-10-CM

## 2013-09-09 DIAGNOSIS — F411 Generalized anxiety disorder: Secondary | ICD-10-CM | POA: Diagnosis not present

## 2013-09-09 DIAGNOSIS — R7309 Other abnormal glucose: Secondary | ICD-10-CM | POA: Diagnosis not present

## 2013-09-09 DIAGNOSIS — F17200 Nicotine dependence, unspecified, uncomplicated: Secondary | ICD-10-CM | POA: Diagnosis not present

## 2013-09-09 DIAGNOSIS — E669 Obesity, unspecified: Secondary | ICD-10-CM | POA: Diagnosis not present

## 2013-09-09 DIAGNOSIS — R739 Hyperglycemia, unspecified: Secondary | ICD-10-CM

## 2013-09-10 LAB — COMPREHENSIVE METABOLIC PANEL
ALT: 14 IU/L (ref 0–44)
AST: 15 IU/L (ref 0–40)
Albumin/Globulin Ratio: 1.8 (ref 1.1–2.5)
Albumin: 4.4 g/dL (ref 3.6–4.8)
Alkaline Phosphatase: 75 IU/L (ref 39–117)
BUN/Creatinine Ratio: 15 (ref 10–22)
BUN: 11 mg/dL (ref 8–27)
CO2: 24 mmol/L (ref 18–29)
Calcium: 9.3 mg/dL (ref 8.6–10.2)
Chloride: 101 mmol/L (ref 97–108)
Creatinine, Ser: 0.72 mg/dL — ABNORMAL LOW (ref 0.76–1.27)
GFR calc Af Amer: 111 mL/min/{1.73_m2} (ref >59)
GFR calc non Af Amer: 96 mL/min/{1.73_m2} (ref >59)
Globulin, Total: 2.4 g/dL (ref 1.5–4.5)
Glucose: 111 mg/dL — ABNORMAL HIGH (ref 65–99)
Potassium: 4.7 mmol/L (ref 3.5–5.2)
Sodium: 143 mmol/L (ref 134–144)
Total Bilirubin: 0.5 mg/dL (ref 0.0–1.2)
Total Protein: 6.8 g/dL (ref 6.0–8.5)

## 2013-09-10 LAB — CBC WITH DIFFERENTIAL/PLATELET
Basophils Absolute: 0 10*3/uL (ref 0.0–0.2)
Basos: 1 %
Eos: 2 %
Eosinophils Absolute: 0.2 10*3/uL (ref 0.0–0.4)
HCT: 46.6 % (ref 37.5–51.0)
Hemoglobin: 15.2 g/dL (ref 12.6–17.7)
Immature Grans (Abs): 0 10*3/uL (ref 0.0–0.1)
Immature Granulocytes: 0 %
Lymphocytes Absolute: 1.9 10*3/uL (ref 0.7–3.1)
Lymphs: 28 %
MCH: 30.6 pg (ref 26.6–33.0)
MCHC: 32.6 g/dL (ref 31.5–35.7)
MCV: 94 fL (ref 79–97)
Monocytes Absolute: 0.8 10*3/uL (ref 0.1–0.9)
Monocytes: 11 %
Neutrophils Absolute: 3.9 10*3/uL (ref 1.4–7.0)
Neutrophils Relative %: 58 %
RBC: 4.96 x10E6/uL (ref 4.14–5.80)
RDW: 13.9 % (ref 12.3–15.4)
WBC: 6.7 10*3/uL (ref 3.4–10.8)

## 2013-09-10 LAB — LIPID PANEL
Chol/HDL Ratio: 5 ratio units (ref 0.0–5.0)
Cholesterol, Total: 145 mg/dL (ref 100–199)
HDL: 29 mg/dL — ABNORMAL LOW (ref >39)
LDL Calculated: 73 mg/dL (ref 0–99)
Triglycerides: 215 mg/dL — ABNORMAL HIGH (ref 0–149)
VLDL Cholesterol Cal: 43 mg/dL — ABNORMAL HIGH (ref 5–40)

## 2013-09-10 LAB — HEMOGLOBIN A1C
Est. average glucose Bld gHb Est-mCnc: 120 mg/dL
Hgb A1c MFr Bld: 5.8 % — ABNORMAL HIGH (ref 4.8–5.6)

## 2013-09-11 ENCOUNTER — Encounter: Payer: Self-pay | Admitting: Internal Medicine

## 2013-09-11 ENCOUNTER — Ambulatory Visit (INDEPENDENT_AMBULATORY_CARE_PROVIDER_SITE_OTHER): Payer: Medicare Other | Admitting: Internal Medicine

## 2013-09-11 VITALS — BP 152/92 | HR 102 | Temp 97.9°F | Resp 20 | Ht 69.6 in | Wt 229.6 lb

## 2013-09-11 DIAGNOSIS — I10 Essential (primary) hypertension: Secondary | ICD-10-CM

## 2013-09-11 DIAGNOSIS — F411 Generalized anxiety disorder: Secondary | ICD-10-CM | POA: Diagnosis not present

## 2013-09-11 DIAGNOSIS — F17200 Nicotine dependence, unspecified, uncomplicated: Secondary | ICD-10-CM

## 2013-09-11 DIAGNOSIS — E781 Pure hyperglyceridemia: Secondary | ICD-10-CM

## 2013-09-11 DIAGNOSIS — R739 Hyperglycemia, unspecified: Secondary | ICD-10-CM | POA: Insufficient documentation

## 2013-09-11 DIAGNOSIS — R7309 Other abnormal glucose: Secondary | ICD-10-CM

## 2013-09-11 DIAGNOSIS — F329 Major depressive disorder, single episode, unspecified: Secondary | ICD-10-CM

## 2013-09-11 DIAGNOSIS — R682 Dry mouth, unspecified: Secondary | ICD-10-CM

## 2013-09-11 DIAGNOSIS — F32A Depression, unspecified: Secondary | ICD-10-CM | POA: Insufficient documentation

## 2013-09-11 DIAGNOSIS — G4700 Insomnia, unspecified: Secondary | ICD-10-CM

## 2013-09-11 DIAGNOSIS — F3289 Other specified depressive episodes: Secondary | ICD-10-CM

## 2013-09-11 DIAGNOSIS — K117 Disturbances of salivary secretion: Secondary | ICD-10-CM

## 2013-09-11 MED ORDER — VENLAFAXINE HCL ER 37.5 MG PO CP24
37.5000 mg | ORAL_CAPSULE | Freq: Every day | ORAL | Status: DC
Start: 2013-09-11 — End: 2013-12-22

## 2013-09-11 MED ORDER — SIMVASTATIN 20 MG PO TABS: 20.0000 mg | ORAL_TABLET | Freq: Every day | ORAL | Status: DC

## 2013-09-11 NOTE — Assessment & Plan Note (Signed)
Due to his smoking just before coming in.  Continued to encourage smoking cessation and low sodium diet.

## 2013-09-11 NOTE — Assessment & Plan Note (Signed)
Associates this with the lexapro.  Will try effexor instead and see if he does better with it.  May have same stimulatory effect.

## 2013-09-11 NOTE — Assessment & Plan Note (Signed)
Seems his depression and anxiety are better since being on lexapro, but he is c/o poor sleep and dry mouth so he wants to discontinue it.  Will try effexor XR 37.5mg  with breakfast as this helps another pt of mine with PTSD.

## 2013-09-11 NOTE — Assessment & Plan Note (Signed)
Noted with prediabetic hba1c which has been stable as he is avoiding bread more--still eats a lot of potatoes.

## 2013-09-11 NOTE — Progress Notes (Signed)
Patient ID: David [name], male   DOB: 1946/01/09, 68 y.o.   MRN: [id number]   Location:  [facility]  Code Status: full code  Allergies  Allergen Reactions  . Celebrex [Celecoxib]   . Erythromycin   . Oxycontin [Oxycodone Hcl]   . Penicillins   . Tetracyclines & Related     Chief Complaint  Patient presents with  . Medical Managment of Chronic Issues    medication changes    HPI: Patient is a 68 y.o. white male seen in the office today for medical mgt of chronic diseases.  He has some concerns about his medications.  Lexapro is interfering with his ability to sleep and causing a dry mouth.  Tried wellbutrin, zoloft.  Didn't use cymbalta due to his h/o sweating so much.    Still living off of taters and bread--eats a lot of sandwiches or goes to his dad's house and they eat potatoes all of the time.  Avoids bread unless eating a sandwich.    Still smoking and smoked right before he came in so bp high.    Taking all day energy greens and go ruby go from [facility]--has not felt energy building effects.  He will bring the info to the office.  Review of Systems:  Review of Systems  Constitutional: Positive for malaise/fatigue. Negative for fever and chills.  HENT: Negative for congestion.   Eyes: Negative for blurred vision.  Respiratory: Negative for shortness of breath.   Cardiovascular: Negative for chest pain and palpitations.  Gastrointestinal: Negative for abdominal pain and constipation.  Musculoskeletal: Negative for falls.  Skin: Negative for rash.  Neurological: Negative for dizziness, loss of consciousness, weakness and headaches.  Psychiatric/Behavioral: Positive for depression. Negative for memory loss. The patient is nervous/anxious and has insomnia.        Some ptsd and dreams from [place]     Past Medical History  Diagnosis Date  . Atherosclerosis of native arteries of the extremities,  unspecified   . Myalgia and myositis, unspecified   . Unspecified hypertrophic and atrophic condition of skin   . Hyperpotassemia   . Nocturia   . Rotator cuff (capsule) sprain   . Anxiety state, unspecified   . Contact dermatitis and other eczema due to other chemical products   . Insomnia, unspecified   . Herpes zoster with unspecified complication   . Lumbago   . Lumbago   . Pure hyperglyceridemia   . Other abnormal blood chemistry   . Obesity, unspecified   . Tobacco use disorder   . Other headache syndromes(339.89)   . Unspecified essential hypertension   . Asymptomatic varicose veins   . Hemorrhoids, external   . Allergic rhinitis due to pollen   . 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 Osteoarthrosis, unspecified whether generalized or localized, unspecified site   . Unspecified sleep apnea   . Rash and other nonspecific skin eruption   . Headache(784.0)   . Flatulence, eructation, and gas pain   . Routine general medical examination at a health care facility     Past Surgical History  Procedure Laterality Date  . Hemorrhoid surgery    . Varicose vein surgery      Social History:   reports that he has been smoking Cigarettes.  He has a 55 pack-year smoking history. He does not have any smokeless tobacco history on file. He reports that he does not drink alcohol  or use illicit drugs.  Family History  Problem Relation Age of Onset  . Diabetes Mother   . Heart disease Mother   . Stroke Mother   . Stroke Father     Medications: Patient's Medications  New Prescriptions   VENLAFAXINE XR (EFFEXOR XR) 37.5 MG 24 HR CAPSULE    Take 1 capsule (37.5 mg total) by mouth daily with breakfast.  Previous Medications   POLYETHYL GLYCOL-PROPYL GLYCOL (SYSTANE) 0.4-0.3 % SOLN    Place 1-2 drops into both eyes as needed.   TDAP (BOOSTRIX) 5-2.5-18.5 LF-MCG/0.5 INJECTION    Inject 0.5 mLs into the muscle once.  Modified Medications   Modified  Medication Previous Medication   CETIRIZINE (ZYRTEC) 10 MG TABLET cetirizine (ZYRTEC) 10 MG tablet      Take 10 mg by mouth as needed.    Take 1 tablet (10 mg total) by mouth daily.   SIMVASTATIN (ZOCOR) 20 MG TABLET simvastatin (ZOCOR) 10 MG tablet      Take 1 tablet (20 mg total) by mouth daily at 6 PM.    TAKE 1 TABLET AT BEDTIME FOR CHLOESTEROL  Discontinued Medications   ESCITALOPRAM (LEXAPRO) 10 MG TABLET    Take 1 tablet (10 mg total) by mouth daily.   OLOPATADINE HCL (PATADAY) 0.2 % SOLN    Apply 1 drop to eye daily.     Physical Exam: Filed Vitals:   09/11/13 0730  BP: 152/92  Pulse: 102  Temp: 97.9 F (36.6 C)  TempSrc: Oral  Resp: 20  Height: 5' 9.6" (1.768 m)  Weight: 229 lb 9.6 oz (104.146 kg)  SpO2: 95%   Physical Exam  Constitutional: He is oriented to person, place, and time. He appears well-developed and well-nourished.  Cardiovascular: Normal rate, regular rhythm, normal heart sounds and intact distal pulses.   Pulmonary/Chest: Effort normal and breath sounds normal. No respiratory distress.  Abdominal: Soft. Bowel sounds are normal. He exhibits no distension and no mass. There is no tenderness.  Musculoskeletal: Normal range of motion. He exhibits no edema and no tenderness.  Neurological: He is alert and oriented to person, place, and time.  Skin: Skin is warm and dry.  Psychiatric:  Less anxious today with lexapro     Labs reviewed: Basic Metabolic Panel:  Recent Labs  01/28/13 0847 05/27/13 0823 09/09/13 0830  NA 138 140 143  K 4.3 4.4 4.7  CL 102 103 101  CO2 19 23 24   GLUCOSE 115* 115* 111*  BUN 13 12 11   CREATININE 0.70* 0.85 0.72*  CALCIUM 9.4 9.3 9.3   Liver Function Tests:  Recent Labs  10/24/12 1226 05/27/13 0823 09/09/13 0830  AST 13 15 15   ALT 12 15 14   ALKPHOS 83 76 75  BILITOT 0.4 0.4 0.5  PROT 6.8 6.9 6.8  CBC:  Recent Labs  01/28/13 0847 05/27/13 0823 09/09/13 0830  WBC 6.6 6.8 6.7  NEUTROABS 3.8 3.8 3.9    HGB 16.1 16.2 15.2  HCT 47.3 48.0 46.6  MCV 94 94 94  PLT 253  --   --    Lipid Panel:  Recent Labs  10/24/12 1226 01/28/13 0847 05/27/13 0823 09/09/13 0830  HDL 33* 31* 33* 29*  LDLCALC 79 54 78 73  TRIG 158* 280* 160* 215*  CHOLHDL 4.4  --  4.3 5.0   Lab Results  Component Value Date   HGBA1C 5.8* 09/09/2013   Assessment/Plan Essential hypertension, benign Due to his smoking just before coming in.  Continued to  encourage smoking cessation and low sodium diet.  Tobacco use disorder Counseled again on cessation.  Has not succeeded with wellbutrin.  Has some PTSD from [place] also.  He is not ready to quit.  Depression Seems his depression and anxiety are better since being on lexapro, but he is c/o poor sleep and dry mouth so he wants to discontinue it.  Will try effexor XR 37.5mg  with breakfast as this helps another pt of mine with PTSD.    Generalized anxiety disorder Seems a lot better with lexapro but c/o side effects so will change to effexor xr.  Hyperglycemia Noted with prediabetic hba1c which has been stable as he is avoiding bread more--still eats a lot of potatoes.    Hypertriglyceridemia Improved with use of simvastatin vs. Low dose, but needs more.  Increase to 20mg  at bedtime.  Encouraged lower intake of starchy foods.  Insomnia Associates this with the lexapro.  Will try effexor instead and see if he does better with it.  May have same stimulatory effect.     Labs/tests ordered:   Orders Placed This Encounter  Procedures  . CBC with Differential    Standing Status: Future     Number of Occurrences:      Standing Expiration Date: 03/14/2014  . Comprehensive metabolic panel    Standing Status: Future     Number of Occurrences:      Standing Expiration Date: 03/14/2014  . Lipid panel    Standing Status: Future     Number of Occurrences:      Standing Expiration Date: 03/14/2014  . Hemoglobin A1c    Standing Status: Future     Number of Occurrences:       Standing Expiration Date: 03/14/2014    Next appt:  3 mos--f/u on depression and for prevnar

## 2013-09-11 NOTE — Assessment & Plan Note (Signed)
Seems a lot better with lexapro but c/o side effects so will change to effexor xr.

## 2013-09-11 NOTE — Assessment & Plan Note (Signed)
Counseled again on cessation.  Has not succeeded with wellbutrin.  Has some PTSD from [place] also.  He is not ready to quit.

## 2013-09-11 NOTE — Assessment & Plan Note (Signed)
Improved with use of simvastatin vs. Low dose, but needs more.  Increase to 20mg  at bedtime.  Encouraged lower intake of starchy foods.

## 2013-12-09 ENCOUNTER — Other Ambulatory Visit: Payer: Medicare Other

## 2013-12-09 DIAGNOSIS — R739 Hyperglycemia, unspecified: Secondary | ICD-10-CM

## 2013-12-09 DIAGNOSIS — E781 Pure hyperglyceridemia: Secondary | ICD-10-CM

## 2013-12-09 DIAGNOSIS — R7309 Other abnormal glucose: Secondary | ICD-10-CM | POA: Diagnosis not present

## 2013-12-10 LAB — HEMOGLOBIN A1C
Est. average glucose Bld gHb Est-mCnc: 126 mg/dL
Hgb A1c MFr Bld: 6 % — ABNORMAL HIGH (ref 4.8–5.6)

## 2013-12-10 LAB — CBC WITH DIFFERENTIAL/PLATELET
Basophils Absolute: 0.1 10*3/uL (ref 0.0–0.2)
Basos: 1 %
Eos: 2 %
Eosinophils Absolute: 0.2 10*3/uL (ref 0.0–0.4)
HCT: 47.2 % (ref 37.5–51.0)
Hemoglobin: 15.7 g/dL (ref 12.6–17.7)
Immature Grans (Abs): 0 10*3/uL (ref 0.0–0.1)
Immature Granulocytes: 0 %
Lymphocytes Absolute: 1.9 10*3/uL (ref 0.7–3.1)
Lymphs: 30 %
MCH: 30.8 pg (ref 26.6–33.0)
MCHC: 33.3 g/dL (ref 31.5–35.7)
MCV: 93 fL (ref 79–97)
Monocytes Absolute: 0.7 10*3/uL (ref 0.1–0.9)
Monocytes: 11 %
Neutrophils Absolute: 3.4 10*3/uL (ref 1.4–7.0)
Neutrophils Relative %: 56 %
RBC: 5.09 x10E6/uL (ref 4.14–5.80)
RDW: 13.8 % (ref 12.3–15.4)
WBC: 6.2 10*3/uL (ref 3.4–10.8)

## 2013-12-10 LAB — COMPREHENSIVE METABOLIC PANEL
ALT: 17 IU/L (ref 0–44)
AST: 19 IU/L (ref 0–40)
Albumin/Globulin Ratio: 1.8 (ref 1.1–2.5)
Albumin: 4.1 g/dL (ref 3.6–4.8)
Alkaline Phosphatase: 66 IU/L (ref 39–117)
BUN/Creatinine Ratio: 19 (ref 10–22)
BUN: 13 mg/dL (ref 8–27)
CO2: 22 mmol/L (ref 18–29)
Calcium: 8.7 mg/dL (ref 8.6–10.2)
Chloride: 105 mmol/L (ref 97–108)
Creatinine, Ser: 0.67 mg/dL — ABNORMAL LOW (ref 0.76–1.27)
GFR calc Af Amer: 114 mL/min/{1.73_m2} (ref >59)
GFR calc non Af Amer: 99 mL/min/{1.73_m2} (ref >59)
Globulin, Total: 2.3 g/dL (ref 1.5–4.5)
Glucose: 130 mg/dL — ABNORMAL HIGH (ref 65–99)
Potassium: 4.2 mmol/L (ref 3.5–5.2)
Sodium: 140 mmol/L (ref 134–144)
Total Bilirubin: 0.4 mg/dL (ref 0.0–1.2)
Total Protein: 6.4 g/dL (ref 6.0–8.5)

## 2013-12-10 LAB — LIPID PANEL
Chol/HDL Ratio: 5.4 ratio units — ABNORMAL HIGH (ref 0.0–5.0)
Cholesterol, Total: 135 mg/dL (ref 100–199)
HDL: 25 mg/dL — ABNORMAL LOW (ref >39)
LDL Calculated: 57 mg/dL (ref 0–99)
Triglycerides: 265 mg/dL — ABNORMAL HIGH (ref 0–149)
VLDL Cholesterol Cal: 53 mg/dL — ABNORMAL HIGH (ref 5–40)

## 2013-12-12 ENCOUNTER — Ambulatory Visit: Payer: Medicare Other | Admitting: Internal Medicine

## 2013-12-22 ENCOUNTER — Ambulatory Visit (INDEPENDENT_AMBULATORY_CARE_PROVIDER_SITE_OTHER): Payer: Medicare Other | Admitting: Internal Medicine

## 2013-12-22 ENCOUNTER — Encounter: Payer: Self-pay | Admitting: Internal Medicine

## 2013-12-22 VITALS — BP 130/80 | HR 80 | Temp 97.3°F | Resp 18 | Ht 69.6 in | Wt 228.0 lb

## 2013-12-22 DIAGNOSIS — I10 Essential (primary) hypertension: Secondary | ICD-10-CM

## 2013-12-22 DIAGNOSIS — E781 Pure hyperglyceridemia: Secondary | ICD-10-CM

## 2013-12-22 DIAGNOSIS — H1045 Other chronic allergic conjunctivitis: Secondary | ICD-10-CM

## 2013-12-22 DIAGNOSIS — F17200 Nicotine dependence, unspecified, uncomplicated: Secondary | ICD-10-CM | POA: Diagnosis not present

## 2013-12-22 DIAGNOSIS — F329 Major depressive disorder, single episode, unspecified: Secondary | ICD-10-CM

## 2013-12-22 DIAGNOSIS — E669 Obesity, unspecified: Secondary | ICD-10-CM | POA: Insufficient documentation

## 2013-12-22 DIAGNOSIS — R7309 Other abnormal glucose: Secondary | ICD-10-CM

## 2013-12-22 DIAGNOSIS — H269 Unspecified cataract: Secondary | ICD-10-CM

## 2013-12-22 DIAGNOSIS — F32A Depression, unspecified: Secondary | ICD-10-CM

## 2013-12-22 DIAGNOSIS — F3289 Other specified depressive episodes: Secondary | ICD-10-CM

## 2013-12-22 DIAGNOSIS — F411 Generalized anxiety disorder: Secondary | ICD-10-CM

## 2013-12-22 DIAGNOSIS — R739 Hyperglycemia, unspecified: Secondary | ICD-10-CM

## 2013-12-22 MED ORDER — VENLAFAXINE HCL ER 37.5 MG PO CP24: 37.5000 mg | ORAL_CAPSULE | Freq: Every day | ORAL | Status: DC

## 2013-12-22 MED ORDER — SIMVASTATIN 20 MG PO TABS: 20.0000 mg | ORAL_TABLET | Freq: Every day | ORAL | Status: DC

## 2013-12-22 NOTE — Patient Instructions (Addendum)
Try to exercise each day--walking for 20 minutes on all days that end in Y.  Drink plenty of water regularly especially when you mow the lawn and take walks.

## 2013-12-22 NOTE — Progress Notes (Signed)
Patient ID: David [name], male   DOB: 03-20-1946, 68 y.o.   MRN: [id number]   Location:  [facility]   Allergies  Allergen Reactions  . Celebrex [Celecoxib]   . Erythromycin   . Oxycontin [Oxycodone Hcl]   . Penicillins   . Tetracyclines & Related     Chief Complaint  Patient presents with  . Follow-up    HPI: Patient is a 68 y.o. white male seen in the office today for medical mgt of chronic diseases.    Previously saw eye doctor and given drops and I gave him drops and now he stopped both.  Eyes water and burn.  Has "pus" coming out of both eyes and they are bloodshot.    Simvastatin for cholesterol.    Venlafaxine for mood.  Mood better.  Seems less anxious.  Says his mood is good.    Still has terrible dry mouth.  Cetirizine is not doing the trick for this.    Still smoking.  Has cut down a little bit.    Has not cut back on taters.  Down a lb and a 1/2.    Review of Systems:  Review of Systems  Constitutional: Negative for fever and chills.  Eyes: Positive for blurred vision, photophobia, pain, discharge and redness. Negative for double vision.       Blurry first thing in am; has early cataract left eye  Respiratory: Negative for shortness of breath.   Cardiovascular: Negative for chest pain and leg swelling.       Has some discoloration of shins starting  Gastrointestinal: Negative for abdominal pain.  Genitourinary: Negative for dysuria.  Musculoskeletal: Negative for falls and myalgias.  Neurological: Negative for dizziness and loss of consciousness.  Endo/Heme/Allergies: Does not bruise/bleed easily.  Psychiatric/Behavioral: Negative for depression. The patient is not nervous/anxious.      Past Medical History  Diagnosis Date  . Atherosclerosis of native arteries of the extremities, unspecified   . Myalgia and myositis, unspecified   . Unspecified hypertrophic and atrophic condition of skin   .  Hyperpotassemia   . Nocturia   . Rotator cuff (capsule) sprain   . Anxiety state, unspecified   . Contact dermatitis and other eczema due to other chemical products   . Insomnia, unspecified   . Herpes zoster with unspecified complication   . Lumbago   . Lumbago   . Pure hyperglyceridemia   . Other abnormal blood chemistry   . Obesity, unspecified   . Tobacco use disorder   . Other headache syndromes(339.89)   . Unspecified essential hypertension   . Asymptomatic varicose veins   . Hemorrhoids, external   . Allergic rhinitis due to pollen   . 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 Osteoarthrosis, unspecified whether generalized or localized, unspecified site   . Unspecified sleep apnea   . Rash and other nonspecific skin eruption   . Headache(784.0)   . Flatulence, eructation, and gas pain   . Routine general medical examination at a health care facility     Past Surgical History  Procedure Laterality Date  . Hemorrhoid surgery    . Varicose vein surgery      Social History:   reports that he has been smoking Cigarettes.  He has a 55 pack-year smoking history. He does not have any smokeless tobacco history on file. He reports that he does not drink alcohol or use illicit drugs.  Family History  Problem Relation Age of Onset  . Diabetes Mother   . Heart disease Mother   . Stroke Mother   . Stroke Father     Medications: Patient's Medications  New Prescriptions   No medications on file  Previous Medications   CETIRIZINE (ZYRTEC) 10 MG TABLET    Take 10 mg by mouth as needed.   POLYETHYL GLYCOL-PROPYL GLYCOL (SYSTANE) 0.4-0.3 % SOLN    Place 1-2 drops into both eyes as needed.   SIMVASTATIN (ZOCOR) 20 MG TABLET    Take 1 tablet (20 mg total) by mouth daily at 6 PM.   TDAP (BOOSTRIX) 5-2.5-18.5 LF-MCG/0.5 INJECTION    Inject 0.5 mLs into the muscle once.   VENLAFAXINE XR (EFFEXOR XR) 37.5 MG 24 HR CAPSULE    Take 1 capsule (37.5 mg total)  by mouth daily with breakfast.  Modified Medications   No medications on file  Discontinued Medications   No medications on file     Physical Exam: Filed Vitals:   12/22/13 1112  BP: 130/80  Pulse: 80  Temp: 97.3 F (36.3 C)  TempSrc: Oral  Resp: 18  Height: 5' 9.6" (1.768 m)  Weight: 228 lb (103.42 kg)  SpO2: 95%  Physical Exam  Constitutional: He is oriented to person, place, and time. He appears well-developed and well-nourished. No distress.  Obese white male  HENT:  Head: Normocephalic and atraumatic.  Poor dentition  Eyes: EOM are normal. Pupils are equal, round, and reactive to light.  Erythematous with white/yellow drainage, irritation, itching eyes  Neck: Neck supple.  Cardiovascular: Normal rate, regular rhythm, normal heart sounds and intact distal pulses.   Varicose veins left calf prominent  Pulmonary/Chest: Effort normal and breath sounds normal.  Abdominal: Soft. Bowel sounds are normal. He exhibits no distension and no mass. There is no tenderness.  Musculoskeletal: Normal range of motion. He exhibits no edema and no tenderness.  Lymphadenopathy:    He has no cervical adenopathy.  Neurological: He is alert and oriented to person, place, and time.  Skin: Skin is warm and dry.  Some chronic venous stasis discoloration of anterior shins  Psychiatric: He has a normal mood and affect.    Labs reviewed: Basic Metabolic Panel:  Recent Labs  05/27/13 0823 09/09/13 0830 12/09/13 0838  NA 140 143 140  K 4.4 4.7 4.2  CL 103 101 105  CO2 23 24 22   GLUCOSE 115* 111* 130*  BUN 12 11 13   CREATININE 0.85 0.72* 0.67*  CALCIUM 9.3 9.3 8.7   Liver Function Tests:  Recent Labs  05/27/13 0823 09/09/13 0830 12/09/13 0838  AST 15 15 19   ALT 15 14 17   ALKPHOS 76 75 66  BILITOT 0.4 0.5 0.4  PROT 6.9 6.8 6.4   No results found for this basename: LIPASE, AMYLASE,  in the last 8760 hours No results found for this basename: AMMONIA,  in the last 8760  hours CBC:  Recent Labs  01/28/13 0847 05/27/13 0823 09/09/13 0830 12/09/13 0838  WBC 6.6 6.8 6.7 6.2  NEUTROABS 3.8 3.8 3.9 3.4  HGB 16.1 16.2 15.2 15.7  HCT 47.3 48.0 46.6 47.2  MCV 94 94 94 93  PLT 253  --   --   --    Lipid Panel:  Recent Labs  05/27/13 0823 09/09/13 0830 12/09/13 0838  HDL 33* 29* 25*  LDLCALC 78 73 57  TRIG 160* 215* 265*  CHOLHDL 4.3 5.0 5.4*   Lab Results  Component Value Date  HGBA1C 6.0* 12/09/2013     Assessment/Plan 1. Chronic allergic conjunctivitis - cont zyrtec, systane for moisture - recommended he see allergist - Ambulatory referral to Allergy  2. Hypertriglyceridemia -persists, did not tolerate fenofibrate/tricor  3. Hyperglycemia -encouraged diet to cut back on potatoes, fried foods, bread -refuses consult to dietitian at this time -recheck before next visit -also encouraged walking daily (instructions)  4. Tobacco use disorder -is not ready to quit, has some ptsd, uses as coping mechanism, unfortunately  5. Depression -better with effexor  6. Essential hypertension, benign -bp at goal today  7. Left cataract -watchful waiting per ophtho  Labs/tests ordered: Orders Placed This Encounter  Procedures  . CBC With differential/Platelet    Standing Status: Future     Number of Occurrences:      Standing Expiration Date: 06/23/2014  . Comprehensive metabolic panel    Standing Status: Future     Number of Occurrences:      Standing Expiration Date: 06/23/2014  . Hemoglobin A1c    Standing Status: Future     Number of Occurrences:      Standing Expiration Date: 06/23/2014  . Lipid panel    Standing Status: Future     Number of Occurrences:      Standing Expiration Date: 06/23/2014  . Ambulatory referral to Allergy    Referral Priority:  Routine    Referral Type:  Allergy Testing    Referral Reason:  Specialty Services Required    Requested Specialty:  Allergy    Number of Visits Requested:  1   Next  appt:  3 mos with annual exam with [facility]

## 2014-01-02 ENCOUNTER — Other Ambulatory Visit: Payer: Self-pay | Admitting: Internal Medicine

## 2014-01-20 DIAGNOSIS — J309 Allergic rhinitis, unspecified: Secondary | ICD-10-CM | POA: Diagnosis not present

## 2014-01-20 DIAGNOSIS — H1045 Other chronic allergic conjunctivitis: Secondary | ICD-10-CM | POA: Diagnosis not present

## 2014-01-24 ENCOUNTER — Other Ambulatory Visit: Payer: Self-pay | Admitting: Internal Medicine

## 2014-04-08 ENCOUNTER — Other Ambulatory Visit: Payer: Medicare Other

## 2014-04-08 DIAGNOSIS — E781 Pure hyperglyceridemia: Secondary | ICD-10-CM | POA: Diagnosis not present

## 2014-04-08 DIAGNOSIS — R739 Hyperglycemia, unspecified: Secondary | ICD-10-CM

## 2014-04-08 DIAGNOSIS — H1045 Other chronic allergic conjunctivitis: Secondary | ICD-10-CM

## 2014-04-08 DIAGNOSIS — R7309 Other abnormal glucose: Secondary | ICD-10-CM | POA: Diagnosis not present

## 2014-04-09 LAB — COMPREHENSIVE METABOLIC PANEL
ALT: 13 IU/L (ref 0–44)
AST: 19 IU/L (ref 0–40)
Albumin/Globulin Ratio: 1.9 (ref 1.1–2.5)
Albumin: 4.4 g/dL (ref 3.6–4.8)
Alkaline Phosphatase: 78 IU/L (ref 39–117)
BUN/Creatinine Ratio: 17 (ref 10–22)
BUN: 13 mg/dL (ref 8–27)
CO2: 22 mmol/L (ref 18–29)
Calcium: 9.4 mg/dL (ref 8.6–10.2)
Chloride: 100 mmol/L (ref 97–108)
Creatinine, Ser: 0.77 mg/dL (ref 0.76–1.27)
GFR calc Af Amer: 108 mL/min/{1.73_m2} (ref >59)
GFR calc non Af Amer: 93 mL/min/{1.73_m2} (ref >59)
Globulin, Total: 2.3 g/dL (ref 1.5–4.5)
Glucose: 99 mg/dL (ref 65–99)
Potassium: 4.4 mmol/L (ref 3.5–5.2)
Sodium: 139 mmol/L (ref 134–144)
Total Bilirubin: 0.4 mg/dL (ref 0.0–1.2)
Total Protein: 6.7 g/dL (ref 6.0–8.5)

## 2014-04-09 LAB — LIPID PANEL
Chol/HDL Ratio: 3.7 ratio units (ref 0.0–5.0)
Cholesterol, Total: 112 mg/dL (ref 100–199)
HDL: 30 mg/dL — ABNORMAL LOW (ref >39)
LDL Calculated: 53 mg/dL (ref 0–99)
Triglycerides: 144 mg/dL (ref 0–149)
VLDL Cholesterol Cal: 29 mg/dL (ref 5–40)

## 2014-04-09 LAB — CBC WITH DIFFERENTIAL
Basophils Absolute: 0.1 10*3/uL (ref 0.0–0.2)
Basos: 1 %
Eos: 2 %
Eosinophils Absolute: 0.2 10*3/uL (ref 0.0–0.4)
HCT: 47.3 % (ref 37.5–51.0)
Hemoglobin: 16 g/dL (ref 12.6–17.7)
Immature Grans (Abs): 0 10*3/uL (ref 0.0–0.1)
Immature Granulocytes: 0 %
Lymphocytes Absolute: 2.6 10*3/uL (ref 0.7–3.1)
Lymphs: 31 %
MCH: 32 pg (ref 26.6–33.0)
MCHC: 33.8 g/dL (ref 31.5–35.7)
MCV: 95 fL (ref 79–97)
Monocytes Absolute: 0.9 10*3/uL (ref 0.1–0.9)
Monocytes: 11 %
Neutrophils Absolute: 4.8 10*3/uL (ref 1.4–7.0)
Neutrophils Relative %: 55 %
Platelets: 241 10*3/uL (ref 150–379)
RBC: 5 x10E6/uL (ref 4.14–5.80)
RDW: 13.7 % (ref 12.3–15.4)
WBC: 8.5 10*3/uL (ref 3.4–10.8)

## 2014-04-09 LAB — HEMOGLOBIN A1C
Est. average glucose Bld gHb Est-mCnc: 123 mg/dL
Hgb A1c MFr Bld: 5.9 % — ABNORMAL HIGH (ref 4.8–5.6)

## 2014-04-10 ENCOUNTER — Ambulatory Visit (INDEPENDENT_AMBULATORY_CARE_PROVIDER_SITE_OTHER): Payer: Medicare Other | Admitting: Internal Medicine

## 2014-04-10 ENCOUNTER — Encounter: Payer: Self-pay | Admitting: Internal Medicine

## 2014-04-10 VITALS — BP 140/88 | HR 88 | Temp 97.7°F | Resp 10 | Ht 69.5 in | Wt 223.0 lb

## 2014-04-10 DIAGNOSIS — E785 Hyperlipidemia, unspecified: Secondary | ICD-10-CM | POA: Diagnosis not present

## 2014-04-10 DIAGNOSIS — F329 Major depressive disorder, single episode, unspecified: Secondary | ICD-10-CM

## 2014-04-10 DIAGNOSIS — Z0000 Encounter for general adult medical examination without abnormal findings: Secondary | ICD-10-CM

## 2014-04-10 DIAGNOSIS — E782 Mixed hyperlipidemia: Secondary | ICD-10-CM | POA: Diagnosis not present

## 2014-04-10 DIAGNOSIS — Z23 Encounter for immunization: Secondary | ICD-10-CM

## 2014-04-10 DIAGNOSIS — F17200 Nicotine dependence, unspecified, uncomplicated: Secondary | ICD-10-CM

## 2014-04-10 DIAGNOSIS — I10 Essential (primary) hypertension: Secondary | ICD-10-CM

## 2014-04-10 DIAGNOSIS — M62838 Other muscle spasm: Secondary | ICD-10-CM

## 2014-04-10 DIAGNOSIS — F32A Depression, unspecified: Secondary | ICD-10-CM

## 2014-04-10 DIAGNOSIS — M533 Sacrococcygeal disorders, not elsewhere classified: Secondary | ICD-10-CM | POA: Diagnosis not present

## 2014-04-10 DIAGNOSIS — M6248 Contracture of muscle, other site: Secondary | ICD-10-CM | POA: Diagnosis not present

## 2014-04-10 DIAGNOSIS — Z720 Tobacco use: Secondary | ICD-10-CM

## 2014-04-10 DIAGNOSIS — R739 Hyperglycemia, unspecified: Secondary | ICD-10-CM

## 2014-04-10 MED ORDER — CYCLOBENZAPRINE HCL 10 MG PO TABS: 10.0000 mg | ORAL_TABLET | Freq: Three times a day (TID) | ORAL | Status: DC | PRN

## 2014-04-10 NOTE — Progress Notes (Signed)
Passed clock drawing 

## 2014-04-10 NOTE — Patient Instructions (Addendum)
Take effexor xr (venlafaxine) first thing in the morning.    Take the flexeril as needed for muscle spasms, but do not drive or operate machinery after taking it.  It will make you sleepy.

## 2014-04-10 NOTE — Progress Notes (Signed)
Patient ID: David [name], male   DOB: April 20, 1946, 68 y.o.   MRN: [id number]   Location:  [facility] / [facility]  Code Status: full code  Allergies  Allergen Reactions  . Celebrex [Celecoxib]   . Erythromycin   . Oxycontin [Oxycodone Hcl]   . Penicillins   . Tetracyclines & Related     Chief Complaint  Patient presents with  . Annual Exam    Yearly check-up, discuss labs (copy printed)   . MMSE    28/30 (passed clock drawing)     HPI: Patient is a 68 y.o. white male seen in the office today for his annual exam.  He did well on his MMSE scoring 28/30 and passing his clock drawing.    He has slept wrong on his neck and c/o a spasm of his left shoulder/neck region and also some pain in her right lower ribs and SI region.  He does not remember any injury.    Went to allergy testing and also got 2 eyedrops--zaditor and refresh at [employer] allergy and fexofenadine for her allergies, but her took them only for a short time, never got his nasal spray that was recommended.  Does not want to use eye drops b/c his eyes are already watering and doesn't want to spray anything in his nose.  Has been taking two simvastatin tabs for the last week instead of one.  Cholesterol has improved.  Has not been drinking grapefruit juice and misses it.  Advised to please continue to avoid the grapefruit use.    Review of Systems:  Review of Systems  Constitutional: Negative for fever.  HENT: Negative for congestion.   Eyes: Positive for redness. Negative for blurred vision.  Respiratory: Negative for shortness of breath.   Cardiovascular: Negative for chest pain and leg swelling.  Gastrointestinal: Positive for constipation. Negative for heartburn, blood in stool and melena.  Genitourinary: Negative for dysuria.  Musculoskeletal: Positive for back pain, joint pain, myalgias and neck pain.  Skin: Negative for rash.  Neurological: Negative for dizziness and loss of  consciousness.  Endo/Heme/Allergies: Does not bruise/bleed easily.  Psychiatric/Behavioral: Negative for memory loss. The patient is nervous/anxious.     Past Medical History  Diagnosis Date  . Atherosclerosis of native arteries of the extremities, unspecified   . Myalgia and myositis, unspecified   . Unspecified hypertrophic and atrophic condition of skin   . Hyperpotassemia   . Nocturia   . Rotator cuff (capsule) sprain   . Anxiety state, unspecified   . Contact dermatitis and other eczema due to other chemical products   . Insomnia, unspecified   . Herpes zoster with unspecified complication   . Lumbago   . Lumbago   . Pure hyperglyceridemia   . Other abnormal blood chemistry   . Obesity, unspecified   . Tobacco use disorder   . Other headache syndromes(339.89)   . Unspecified essential hypertension   . Asymptomatic varicose veins   . Hemorrhoids, external   . Allergic rhinitis due to pollen   . 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 Osteoarthrosis, unspecified whether generalized or localized, unspecified site   . Unspecified sleep apnea   . Rash and other nonspecific skin eruption   . Headache(784.0)   . Flatulence, eructation, and gas pain   . Routine general medical examination at a health care facility     Past Surgical History  Procedure Laterality Date  . Hemorrhoid surgery    .  Varicose vein surgery      Social History:   reports that he has been smoking Cigarettes.  He has a 55 pack-year smoking history. He does not have any smokeless tobacco history on file. He reports that he does not drink alcohol or use illicit drugs.  Family History  Problem Relation Age of Onset  . Diabetes Mother   . Heart disease Mother   . Stroke Mother   . Stroke Father     Medications: Patient's Medications  New Prescriptions   No medications on file  Previous Medications   CETIRIZINE (ZYRTEC) 10 MG TABLET    Take 10 mg by mouth as needed.     POLYETHYL GLYCOL-PROPYL GLYCOL (SYSTANE) 0.4-0.3 % SOLN    Place 1-2 drops into both eyes as needed.   SIMVASTATIN (ZOCOR) 20 MG TABLET    TAKE 1 TABLET BY MOUTH EVERY DAY AT 6PM   TDAP (BOOSTRIX) 5-2.5-18.5 LF-MCG/0.5 INJECTION    Inject 0.5 mLs into the muscle once.   VENLAFAXINE XR (EFFEXOR-XR) 37.5 MG 24 HR CAPSULE    TAKE ONE CAPSULE BY MOUTH EVERY DAY WITH BREAKFAST  Modified Medications   No medications on file  Discontinued Medications   No medications on file     Physical Exam: Filed Vitals:   04/10/14 0842  BP: 140/88  Pulse: 88  Temp: 97.7 F (36.5 C)  TempSrc: Oral  Resp: 10  Height: 5' 9.5" (1.765 m)  Weight: 223 lb (101.152 kg)  SpO2: 98%  Physical Exam  Constitutional: He is oriented to person, place, and time. He appears well-developed and well-nourished. No distress.  Obese white male  HENT:  Head: Normocephalic and atraumatic.  Right Ear: External ear normal.  Left Ear: External ear normal.  Nose: Nose normal.  Mouth/Throat: Oropharynx is clear and moist. No oropharyngeal exudate.  Eyes: Conjunctivae and EOM are normal. Pupils are equal, round, and reactive to light.  Neck: Normal range of motion. Neck supple. No JVD present.  Cardiovascular: Normal rate, regular rhythm, normal heart sounds and intact distal pulses.   Pulmonary/Chest: Effort normal and breath sounds normal. No respiratory distress.  Abdominal: Soft. Bowel sounds are normal. He exhibits no distension and no mass. There is no tenderness.  Musculoskeletal:  Muscle spasm of left trapezius present;  Tenderness of sacroiliac joint and radiating to lower rib region on right   Lymphadenopathy:    He has no cervical adenopathy.  Neurological: He is alert and oriented to person, place, and time. He has normal reflexes.  Skin: Skin is warm and dry.  Chronically sweats a lot  Psychiatric: His behavior is normal. Judgment and thought content normal.  anxious    Labs reviewed: Basic Metabolic  Panel:  Recent Labs  09/09/13 0830 12/09/13 0838 04/08/14 0929  NA 143 140 139  K 4.7 4.2 4.4  CL 101 105 100  CO2 24 22 22   GLUCOSE 111* 130* 99  BUN 11 13 13   CREATININE 0.72* 0.67* 0.77  CALCIUM 9.3 8.7 9.4   Liver Function Tests:  Recent Labs  09/09/13 0830 12/09/13 0838 04/08/14 0929  AST 15 19 19   ALT 14 17 13   ALKPHOS 75 66 78  BILITOT 0.5 0.4 0.4  PROT 6.8 6.4 6.7   No results found for this basename: LIPASE, AMYLASE,  in the last 8760 hours No results found for this basename: AMMONIA,  in the last 8760 hours CBC:  Recent Labs  09/09/13 0830 12/09/13 0838 04/08/14 0929  WBC 6.7 6.2  8.5  NEUTROABS 3.9 3.4 4.8  HGB 15.2 15.7 16.0  HCT 46.6 47.2 47.3  MCV 94 93 95  PLT  --   --  241   Lipid Panel:  Recent Labs  09/09/13 0830 12/09/13 0838 04/08/14 0929  HDL 29* 25* 30*  LDLCALC 73 57 53  TRIG 215* 265* 144  CHOLHDL 5.0 5.4* 3.7   Lab Results  Component Value Date   HGBA1C 5.9* 04/08/2014  EKG done:  Shows sinus rhythm, incomplete RBBB, rate 79 Functional status independent Depression controlled with medication MMSE 28/30, passed clock  Assessment/Plan 1. Trapezius muscle spasm - thinks he slept wrong -advised to use heat or heat rub and muscle spasm med--do not drive - cyclobenzaprine (FLEXERIL) 10 MG tablet; Take 1 tablet (10 mg total) by mouth 3 (three) times daily as needed for muscle spasms.  Dispense: 30 tablet; Refill: 0  2. Pain of right sacroiliac joint - advised this will be helped more with short term advil use and the voltaren gel I previously gave him - cyclobenzaprine (FLEXERIL) 10 MG tablet; Take 1 tablet (10 mg total) by mouth 3 (three) times daily as needed for muscle spasms.  Dispense: 30 tablet; Refill: 0  3. Mixed hyperlipidemia - cont statin therapy--encouraged he must take this and did show good results - EKG 12-Lead  4. Essential hypertension, benign -bp controlled with current meds - EKG 12-Lead  5. Tobacco  use disorder -cont trying to quit  6. Depression -make sure effexor is taken first thing in the morning  7. Encounter for immunization -flu shot given, needs prevnar next week   Labs/tests ordered:   Orders Placed This Encounter  Procedures  . Lipid panel    Standing Status: Future     Number of Occurrences:      Standing Expiration Date: 10/10/2014    Order Specific Question:  Has the patient fasted?    Answer:  Yes  . Hemoglobin A1c    Standing Status: Future     Number of Occurrences:      Standing Expiration Date: 10/10/2014  . Basic metabolic panel    Standing Status: Future     Number of Occurrences:      Standing Expiration Date: 10/10/2014    Order Specific Question:  Has the patient fasted?    Answer:  Yes  . EKG 12-Lead    Next appt:  3 mos with labs before  Yves Fodor L. Thedora Rings, D.O. [facility] [address], [place] [address] Cell Phone (Mon-Fri 8am-5pm):  [phone] On Call:  [phone] & follow prompts after 5pm & weekends Office Phone:  [phone] Office Fax:  [phone]

## 2014-04-16 ENCOUNTER — Ambulatory Visit (INDEPENDENT_AMBULATORY_CARE_PROVIDER_SITE_OTHER): Payer: Medicare Other

## 2014-04-16 DIAGNOSIS — Z23 Encounter for immunization: Secondary | ICD-10-CM

## 2014-04-23 DIAGNOSIS — J309 Allergic rhinitis, unspecified: Secondary | ICD-10-CM | POA: Diagnosis not present

## 2014-04-23 DIAGNOSIS — H1045 Other chronic allergic conjunctivitis: Secondary | ICD-10-CM | POA: Diagnosis not present

## 2014-05-19 ENCOUNTER — Other Ambulatory Visit: Payer: Self-pay | Admitting: Internal Medicine

## 2014-07-13 ENCOUNTER — Other Ambulatory Visit: Payer: Medicare Other

## 2014-07-13 DIAGNOSIS — I10 Essential (primary) hypertension: Secondary | ICD-10-CM | POA: Diagnosis not present

## 2014-07-13 DIAGNOSIS — R7309 Other abnormal glucose: Secondary | ICD-10-CM | POA: Diagnosis not present

## 2014-07-13 DIAGNOSIS — E782 Mixed hyperlipidemia: Secondary | ICD-10-CM

## 2014-07-13 DIAGNOSIS — R739 Hyperglycemia, unspecified: Secondary | ICD-10-CM

## 2014-07-14 LAB — BASIC METABOLIC PANEL
BUN/Creatinine Ratio: 15 (ref 10–22)
BUN: 11 mg/dL (ref 8–27)
CO2: 23 mmol/L (ref 18–29)
Calcium: 9.4 mg/dL (ref 8.6–10.2)
Chloride: 102 mmol/L (ref 97–108)
Creatinine, Ser: 0.73 mg/dL — ABNORMAL LOW (ref 0.76–1.27)
GFR calc Af Amer: 109 mL/min/{1.73_m2} (ref >59)
GFR calc non Af Amer: 95 mL/min/{1.73_m2} (ref >59)
Glucose: 117 mg/dL — ABNORMAL HIGH (ref 65–99)
Potassium: 4.3 mmol/L (ref 3.5–5.2)
Sodium: 137 mmol/L (ref 134–144)

## 2014-07-14 LAB — LIPID PANEL
Chol/HDL Ratio: 4.2 ratio units (ref 0.0–5.0)
Cholesterol, Total: 114 mg/dL (ref 100–199)
HDL: 27 mg/dL — ABNORMAL LOW (ref >39)
LDL Calculated: 57 mg/dL (ref 0–99)
Triglycerides: 150 mg/dL — ABNORMAL HIGH (ref 0–149)
VLDL Cholesterol Cal: 30 mg/dL (ref 5–40)

## 2014-07-14 LAB — HEMOGLOBIN A1C
Est. average glucose Bld gHb Est-mCnc: 128 mg/dL
Hgb A1c MFr Bld: 6.1 % — ABNORMAL HIGH (ref 4.8–5.6)

## 2014-07-16 ENCOUNTER — Ambulatory Visit (INDEPENDENT_AMBULATORY_CARE_PROVIDER_SITE_OTHER): Payer: Medicare Other | Admitting: Internal Medicine

## 2014-07-16 ENCOUNTER — Ambulatory Visit
Admission: RE | Admit: 2014-07-16 | Discharge: 2014-07-16 | Disposition: A | Discharge status: Home / Self Care | Payer: Medicare Other | Source: Ambulatory Visit | Attending: Internal Medicine | Admitting: Internal Medicine

## 2014-07-16 ENCOUNTER — Encounter: Payer: Self-pay | Admitting: Internal Medicine

## 2014-07-16 VITALS — BP 138/76 | HR 83 | Temp 98.1°F | Resp 20 | Ht 69.5 in | Wt 224.6 lb

## 2014-07-16 DIAGNOSIS — F32A Depression, unspecified: Secondary | ICD-10-CM

## 2014-07-16 DIAGNOSIS — M6248 Contracture of muscle, other site: Secondary | ICD-10-CM | POA: Diagnosis not present

## 2014-07-16 DIAGNOSIS — G8929 Other chronic pain: Secondary | ICD-10-CM | POA: Diagnosis not present

## 2014-07-16 DIAGNOSIS — M542 Cervicalgia: Secondary | ICD-10-CM

## 2014-07-16 DIAGNOSIS — M5032 Other cervical disc degeneration, mid-cervical region: Secondary | ICD-10-CM | POA: Diagnosis not present

## 2014-07-16 DIAGNOSIS — M5033 Other cervical disc degeneration, cervicothoracic region: Secondary | ICD-10-CM | POA: Diagnosis not present

## 2014-07-16 DIAGNOSIS — M545 Low back pain, unspecified: Secondary | ICD-10-CM

## 2014-07-16 DIAGNOSIS — R739 Hyperglycemia, unspecified: Secondary | ICD-10-CM

## 2014-07-16 DIAGNOSIS — M62838 Other muscle spasm: Secondary | ICD-10-CM

## 2014-07-16 DIAGNOSIS — M5031 Other cervical disc degeneration,  high cervical region: Secondary | ICD-10-CM | POA: Diagnosis not present

## 2014-07-16 DIAGNOSIS — F17200 Nicotine dependence, unspecified, uncomplicated: Secondary | ICD-10-CM

## 2014-07-16 DIAGNOSIS — M5136 Other intervertebral disc degeneration, lumbar region: Secondary | ICD-10-CM | POA: Diagnosis not present

## 2014-07-16 DIAGNOSIS — F329 Major depressive disorder, single episode, unspecified: Secondary | ICD-10-CM | POA: Diagnosis not present

## 2014-07-16 DIAGNOSIS — M47816 Spondylosis without myelopathy or radiculopathy, lumbar region: Secondary | ICD-10-CM | POA: Diagnosis not present

## 2014-07-16 DIAGNOSIS — M5431 Sciatica, right side: Secondary | ICD-10-CM

## 2014-07-16 DIAGNOSIS — I10 Essential (primary) hypertension: Secondary | ICD-10-CM

## 2014-07-16 DIAGNOSIS — E782 Mixed hyperlipidemia: Secondary | ICD-10-CM

## 2014-07-16 DIAGNOSIS — Z720 Tobacco use: Secondary | ICD-10-CM | POA: Diagnosis not present

## 2014-07-16 MED ORDER — METAXALONE 800 MG PO TABS: 800.0000 mg | ORAL_TABLET | Freq: Three times a day (TID) | ORAL | Status: DC

## 2014-07-16 NOTE — Patient Instructions (Signed)
Call me back if the skelaxin and therapy do not help your shoulder.    You can go get your xrays at [facility] now.  I will let you know your results when they return.

## 2014-07-16 NOTE — Progress Notes (Signed)
Patient ID: David [name], male   DOB: [birth year], 69 y.o.   MRN: [id number]   Location:  [facility] / [facility]  Allergies  Allergen Reactions  . Celebrex [Celecoxib]   . Erythromycin   . Oxycontin [Oxycodone Hcl]   . Penicillins   . Tetracyclines & Related     Chief Complaint  Patient presents with  . Medical Management of Chronic Issues    questionable sinus infection    HPI: Patient is a 69 y.o. seen in the office today for medical mgt of chronic diseases.    He thinks he has a sinus infection.  Had some blood come out of his right nostril yesterday.  No change in congestion.  Has had some headache--dull throb.    Left trapezius muscle spasm.  Medication did not relieve it.  Flexeril.  Hurts most when he puts his arm up to put on his coat.  Neck also hurts.  Voltaren not helpful either.    Mood is doing a little better these days.  Needs dental extraction but cannot afford to go.  He had more taters than usual over the holidays.    Review of Systems:  Review of Systems  Constitutional: Positive for malaise/fatigue. Negative for fever and chills.       Drowsy all day--refuses sleep apnea testing  HENT: Negative for congestion.        Poor dentition, missing several teeth and two in front upper are loose  Eyes: Positive for redness. Negative for blurred vision.  Respiratory: Negative for shortness of breath.   Cardiovascular: Negative for chest pain and leg swelling.  Gastrointestinal: Negative for abdominal pain.  Genitourinary: Negative for dysuria.  Musculoskeletal: Positive for myalgias, back pain and neck pain. Negative for falls.  Skin: Negative for rash.  Neurological: Negative for dizziness.  Endo/Heme/Allergies: Does not bruise/bleed easily.  Psychiatric/Behavioral: Negative for depression and memory loss. The patient is not nervous/anxious and does not have insomnia.      Past Medical History  Diagnosis Date  .  Atherosclerosis of native arteries of the extremities, unspecified   . Myalgia and myositis, unspecified   . Unspecified hypertrophic and atrophic condition of skin   . Hyperpotassemia   . Nocturia   . Rotator cuff (capsule) sprain   . Anxiety state, unspecified   . Contact dermatitis and other eczema due to other chemical products   . Insomnia, unspecified   . Herpes zoster with unspecified complication   . Lumbago   . Lumbago   . Pure hyperglyceridemia   . Other abnormal blood chemistry   . Obesity, unspecified   . Tobacco use disorder   . Other headache syndromes(339.89)   . Unspecified essential hypertension   . Asymptomatic varicose veins   . Hemorrhoids, external   . Allergic rhinitis due to pollen   . 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 Osteoarthrosis, unspecified whether generalized or localized, unspecified site   . Unspecified sleep apnea   . Rash and other nonspecific skin eruption   . Headache(784.0)   . Flatulence, eructation, and gas pain   . Routine general medical examination at a health care facility     Past Surgical History  Procedure Laterality Date  . Hemorrhoid surgery    . Varicose vein surgery      Social History:   reports that he has been smoking Cigarettes.  He has a 55 pack-year smoking history. He does not have any  smokeless tobacco history on file. He reports that he does not drink alcohol or use illicit drugs.  Family History  Problem Relation Age of Onset  . Diabetes Mother   . Heart disease Mother   . Stroke Mother   . Stroke Father     Medications: Patient's Medications  New Prescriptions   No medications on file  Previous Medications   CYCLOBENZAPRINE (FLEXERIL) 10 MG TABLET    Take 1 tablet (10 mg total) by mouth 3 (three) times daily as needed for muscle spasms.   LORATADINE (CLARITIN) 10 MG TABLET    Take 10 mg by mouth daily.    POLYETHYL GLYCOL-PROPYL GLYCOL (SYSTANE) 0.4-0.3 % SOLN    Place  1-2 drops into both eyes as needed.   SIMVASTATIN (ZOCOR) 20 MG TABLET    TAKE 1 TABLET BY MOUTH EVERY DAY AT 6PM   TDAP (BOOSTRIX) 5-2.5-18.5 LF-MCG/0.5 INJECTION    Inject 0.5 mLs into the muscle once.   VENLAFAXINE XR (EFFEXOR-XR) 37.5 MG 24 HR CAPSULE    TAKE ONE CAPSULE BY MOUTH EVERY DAY WITH BREAKFAST  Modified Medications   No medications on file  Discontinued Medications   No medications on file   Physical Exam: Filed Vitals:   07/16/14 0845  BP: 138/76  Pulse: 83  Temp: 98.1 F (36.7 C)  TempSrc: Oral  Resp: 20  Height: 5' 9.5" (1.765 m)  Weight: 224 lb 9.6 oz (101.878 kg)  SpO2: 97%  Physical Exam  Constitutional: He is oriented to person, place, and time. He appears well-developed and well-nourished. No distress.  Cardiovascular: Normal rate, regular rhythm, normal heart sounds and intact distal pulses.   Pulmonary/Chest: Effort normal and breath sounds normal. No respiratory distress. He has no wheezes.  Abdominal: Soft. Bowel sounds are normal. He exhibits no distension and no mass. There is no tenderness.  Increased abdominal obesity  Musculoskeletal: Normal range of motion. He exhibits tenderness.  Over left trapezius--has palpable muscle spasm present  Neurological: He is alert and oriented to person, place, and time.  Psychiatric: He has a normal mood and affect.    Labs reviewed: Basic Metabolic Panel:  Recent Labs  12/09/13 0838 04/08/14 0929 07/13/14 0826  NA 140 139 137  K 4.2 4.4 4.3  CL 105 100 102  CO2 22 22 23   GLUCOSE 130* 99 117*  BUN 13 13 11   CREATININE 0.67* 0.77 0.73*  CALCIUM 8.7 9.4 9.4   Liver Function Tests:  Recent Labs  09/09/13 0830 12/09/13 0838 04/08/14 0929  AST 15 19 19   ALT 14 17 13   ALKPHOS 75 66 78  BILITOT 0.5 0.4 0.4  PROT 6.8 6.4 6.7   No results for input(s): LIPASE, AMYLASE in the last 8760 hours. No results for input(s): AMMONIA in the last 8760 hours. CBC:  Recent Labs  09/09/13 0830  12/09/13 0838 04/08/14 0929  WBC 6.7 6.2 8.5  NEUTROABS 3.9 3.4 4.8  HGB 15.2 15.7 16.0  HCT 46.6 47.2 47.3  MCV 94 93 95  PLT  --   --  241   Lipid Panel:  Recent Labs  12/09/13 0838 04/08/14 0929 07/13/14 0826  HDL 25* 30* 27*  LDLCALC 57 53 57  TRIG 265* 144 150*  CHOLHDL 5.4* 3.7 4.2   Lab Results  Component Value Date   HGBA1C 6.1* 07/13/2014    Assessment/Plan 1. Trapezius muscle spasm - has not made much progress with this since last visit (unsure why he didn't call me) -flexeril  was ineffective and he wants to try an alternative so will use skelaxin -will investigate further with imaging and refer to therapy for some treatment modalities and exercises for him - DG Cervical Spine Complete; Future - Ambulatory referral to Physical Therapy - metaxalone (SKELAXIN) 800 MG tablet; Take 1 tablet (800 mg total) by mouth 3 (three) times daily.  Dispense: 30 tablet; Refill: 1  2. Chronic neck pain - likely contributor to his muscle spasm--? Osteophytes with nerve compression, foraminal narrowing - DG Cervical Spine Complete; Future - Ambulatory referral to Physical Therapy  3. Mixed hyperlipidemia -cont zocor, TG creeped up a little, but 150 now - CBC With differential/Platelet; Future - Hemoglobin A1c; Future - Lipid panel; Future  4. Essential hypertension, benign -bp <140/90, no meds indicated  5. Tobacco use disorder -cont to try to cut back, has not tolerated wellbutrin for this before  6. Depression -better lately, cont effexor  7. Right sided sciatica - will image low back--difficult to get history--he seems to change where he has pain and radiation of the pain (perhaps it actually moves) -I suspect he may actually have spinal stenosis causing all of his low back, "hip" and leg symptoms - DG Lumbar Spine Complete; Future - Ambulatory referral to Physical Therapy  8. Left-sided low back pain without sciatica - ? Spinal stenosis - DG Lumbar Spine  Complete; Future - Ambulatory referral to Physical Therapy  9. Hyperglycemia -dietary changes and exercise again encouraged including decreasing tater intake - Hemoglobin A1c; Future  Labs/tests ordered: Orders Placed This Encounter  Procedures  . DG Cervical Spine Complete    Standing Status: Future     Number of Occurrences:      Standing Expiration Date: 09/14/2015    Order Specific Question:  Reason for Exam (SYMPTOM  OR DIAGNOSIS REQUIRED)    Answer:  radicular pain left shoulder; also trapezius muscle spasm    Order Specific Question:  Preferred imaging location?    Answer:  [facility]  . DG Lumbar Spine Complete    Standing Status: Future     Number of Occurrences:      Standing Expiration Date: 09/14/2015    Order Specific Question:  Reason for Exam (SYMPTOM  OR DIAGNOSIS REQUIRED)    Answer:  sciatica right, low back pain and left buttock pain    Order Specific Question:  Preferred imaging location?    Answer:  [facility]  . CBC With differential/Platelet    Standing Status: Future     Number of Occurrences:      Standing Expiration Date: 01/14/2015  . Hemoglobin A1c    Standing Status: Future     Number of Occurrences:      Standing Expiration Date: 01/14/2015  . Lipid panel    Standing Status: Future     Number of Occurrences:      Standing Expiration Date: 01/14/2015    Order Specific Question:  Has the patient fasted?    Answer:  Yes  . Ambulatory referral to Physical Therapy    Referral Priority:  Routine    Referral Type:  Physical Medicine    Referral Reason:  Specialty Services Required    Requested Specialty:  Physical Therapy    Number of Visits Requested:  1    Next appt:  3 mos  Quincie Haroon L. Aydn Ferrara, D.O. [facility] [address], [place] [address] Cell Phone (Mon-Fri 8am-5pm):  [phone] On Call:  [phone] &  follow prompts after 5pm & weekends Office Phone:  [phone] Office  Fax:  [phone]

## 2014-07-28 ENCOUNTER — Ambulatory Visit: Payer: Medicare Other | Attending: Internal Medicine | Admitting: Physical Therapy

## 2014-07-28 DIAGNOSIS — M542 Cervicalgia: Secondary | ICD-10-CM | POA: Insufficient documentation

## 2014-07-28 DIAGNOSIS — M25612 Stiffness of left shoulder, not elsewhere classified: Secondary | ICD-10-CM | POA: Diagnosis not present

## 2014-07-28 DIAGNOSIS — M25512 Pain in left shoulder: Secondary | ICD-10-CM

## 2014-07-28 NOTE — Therapy (Signed)
[place], [place], [address] Phone: [phone]   Fax:  [phone]  Physical Therapy Evaluation  Patient Details  Name: David [name] MRN: [id number] Date of Birth: [birth year] Referring Provider:  Gayland Curry, DO  Encounter Date: 07/28/2014      PT End of Session - 07/28/14 1010    Visit Number 1   Number of Visits 12   Date for PT Re-Evaluation 09/26/14   PT Start Time 0930   PT Stop Time 1010   PT Time Calculation (min) 40 min   Activity Tolerance Patient tolerated treatment well   Behavior During Therapy [facility] for tasks assessed/performed      Past Medical History  Diagnosis Date  . Atherosclerosis of native arteries of the extremities, unspecified   . Myalgia and myositis, unspecified   . Unspecified hypertrophic and atrophic condition of skin   . Hyperpotassemia   . Nocturia   . Rotator cuff (capsule) sprain   . Anxiety state, unspecified   . Contact dermatitis and other eczema due to other chemical products   . Insomnia, unspecified   . Herpes zoster with unspecified complication   . Lumbago   . Lumbago   . Pure hyperglyceridemia   . Other abnormal blood chemistry   . Obesity, unspecified   . Tobacco use disorder   . Other headache syndromes(339.89)   . Unspecified essential hypertension   . Asymptomatic varicose veins   . Hemorrhoids, external   . Allergic rhinitis due to pollen   . 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 Osteoarthrosis, unspecified whether generalized or localized, unspecified site   . Unspecified sleep apnea   . Rash and other nonspecific skin eruption   . Headache(784.0)   . Flatulence, eructation, and gas pain   . Routine general medical examination at a health care facility     Past Surgical History  Procedure Laterality Date  . Hemorrhoid surgery    . Varicose vein surgery      There were no vitals taken for this  visit.  Visit Diagnosis:  Pain in neck - Plan: PT plan of care cert/re-cert  Shoulder stiffness, left - Plan: PT plan of care cert/re-cert  Pain in left shoulder - Plan: PT plan of care cert/re-cert      Subjective Assessment - 07/28/14 0934    Symptoms Pt is a 69 y/o male who presents to OPPT for L sided neck/shoulder/scapula pain.  Pt reports long standing history of pain dating back to being in the [employer].  Recent exacerbation began late Sept/early Oct.  Pt reports difficulty with donning coats and pull over shirts/sweatshirts.     Limitations House hold activities   Diagnostic tests xrays revealed bone spurs and arthritis   Patient Stated Goals improve pain, upper body dressing without pain   Currently in Pain? Yes   Pain Score 4    Pain Location Scapula  neck/shoulder   Pain Orientation Left   Pain Descriptors / Indicators Dull;Aching   Pain Type Chronic pain  with exacerbation   Pain Onset More than a month ago   Pain Frequency Intermittent   Aggravating Factors  overhead movements   Pain Relieving Factors resting, lying down otherwise nothing   Multiple Pain Sites No          [facility] PT Assessment - 07/28/14 0940    Assessment   Medical Diagnosis neck arthritis with scapluar pain   Onset Date --  chronic with recent exacerbation Sept 2015   Next MD Visit April 2015   Prior Therapy none   Precautions   Precautions None   Restrictions   Weight Bearing Restrictions No   Balance Screen   Has the patient fallen in the past 6 months No   Has the patient had a decrease in activity level because of a fear of falling?  No   Is the patient reluctant to leave their home because of a fear of falling?  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   Leisure word search   Cognition   Overall Cognitive Status Within [place] for tasks assessed   Observation/Other Assessments   Focus on Therapeutic Outcomes ([place])  66 (34% limited; predicted 30% limited)   Posture/Postural Control   Posture/Postural Control Postural limitations   Postural Limitations Rounded Shoulders;Forward head;Increased thoracic kyphosis   AROM   Left Shoulder Flexion 128 Degrees  with pain   Left Shoulder ABduction 118 Degrees  with pain   Left Shoulder Internal Rotation --  WNL functional   Left Shoulder External Rotation --  WNL functional with pain   Cervical Flexion 41   Cervical Extension 42   Cervical - Right Side Bend 30   Cervical - Left Side Bend 30   Cervical - Right Rotation 50   Cervical - Left Rotation 40   Strength   Left Shoulder Flexion 4/5   Left Shoulder Extension 4/5  with pain   Left Shoulder ABduction 5/5   Left Shoulder Internal Rotation 4/5   Left Shoulder External Rotation 3/5   Palpation   Palpation significant muscle tightness in L upper trap and levator scapula; mild tenderness L cervical paraspinal muscles and rhomboids   Special Tests    Special Tests Cervical   Cervical Tests Spurling's;Dictraction   Spurling's   Findings Negative   Distraction Test   Findngs Negative                          PT Education - 07/28/14 1014    Education provided Yes   Education Details clinical findings and POC, initiated dry needling discussion   Person(s) Educated Patient   Methods Explanation   Comprehension Verbalized understanding             PT Long Term Goals - 07/28/14 1209    PT LONG TERM GOAL #1   Title independent with HEP (09/08/14)   Time 6   Period Weeks    Status New   PT LONG TERM GOAL #2   Title report pain < 3/10 at rest for improved symptoms (09/08/14)   Time 6   Period Weeks   Status New   PT LONG TERM GOAL #3   Title improve L shoulder flexion and abduction by 10 degrees each for improved function (09/08/14)   Time 6   Period Weeks   Status New   PT LONG TERM GOAL #4   Title report ability to donn sweatshirt without pain or compensations (09/08/14)  Time 6   Period Weeks   Status New               Plan - 08-03-2014 1012    Clinical Impression Statement Pt presents to OPPT with L sided neck and shoulder pain.  Pt will benefit from PT to maximize function and decrease pain.  May benefit from dry needling; will assess as needed.   Pt will benefit from skilled therapeutic intervention in order to improve on the following deficits Impaired perceived functional ability;Decreased strength;Increased muscle spasms;Pain;Postural dysfunction;Improper body mechanics;Decreased range of motion   Rehab Potential Good   PT Frequency 2x / week   PT Duration 6 weeks   PT Treatment/Interventions ADLs/Self Care Home Management;Cryotherapy;Electrical Stimulation;Functional mobility training;Neuromuscular re-education;Ultrasound;Manual techniques;Dry needling;Passive range of motion;Therapeutic exercise;Moist Heat;Therapeutic activities;Patient/family education   PT Next Visit Plan assess for possible dry needling; HEP for stretches/posture, modalities PRN   Consulted and Agree with Plan of Care Patient          G-Codes - August 03, 2014 1223    Functional Assessment Tool Used FOTO 34% limited (predicted 30% limited)   Functional Limitation Carrying, moving and handling objects   Carrying, Moving and Handling Objects Current Status (T2458) At least 20 percent but less than 40 percent impaired, limited or restricted   Carrying, Moving and Handling Objects Goal Status (K9983) At least 20 percent but less than 40 percent impaired, limited or restricted        Problem List Patient Active Problem List   Diagnosis Date Noted  . Obesity (BMI 30-39.9) 12/22/2013  . 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  . Hypertriglyceridemia 09/11/2013  . Depression 09/11/2013  . Insomnia 09/11/2013  . Generalized anxiety disorder 09/11/2013  . Dry mouth 09/11/2013  . Hyperglycemia 09/11/2013  . Hyperlipidemia LDL goal < 100 10/28/2012  . Tobacco use disorder 10/28/2012  . Essential hypertension, benign 10/28/2012  . 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 Obesity, unspecified 10/28/2012   Laureen Abrahams, PT, DPT August 03, 2014 12:25 PM  [facility] [address] [place], [place], [address] Phone: [phone]   Fax:  [phone]

## 2014-08-04 ENCOUNTER — Ambulatory Visit: Payer: Medicare Other | Admitting: Physical Therapy

## 2014-08-04 DIAGNOSIS — M25612 Stiffness of left shoulder, not elsewhere classified: Secondary | ICD-10-CM

## 2014-08-04 DIAGNOSIS — M25512 Pain in left shoulder: Secondary | ICD-10-CM | POA: Diagnosis not present

## 2014-08-04 DIAGNOSIS — M542 Cervicalgia: Secondary | ICD-10-CM

## 2014-08-04 NOTE — Therapy (Signed)
[place], [place], [address] Phone: [phone]   Fax:  [phone]  Physical Therapy Treatment  Patient Details  Name: David [name] MRN: [id number] Date of Birth: 1945-09-26 Referring Provider:  Gayland Curry, DO  Encounter Date: 08/04/2014      PT End of Session - 08/04/14 0849    Visit Number 2   Number of Visits 12   Date for PT Re-Evaluation 09/26/14   PT Start Time 0752   PT Stop Time 0852   PT Time Calculation (min) 60 min   Activity Tolerance Patient tolerated treatment well      Past Medical History  Diagnosis Date  . Atherosclerosis of native arteries of the extremities, unspecified   . Myalgia and myositis, unspecified   . Unspecified hypertrophic and atrophic condition of skin   . Hyperpotassemia   . Nocturia   . Rotator cuff (capsule) sprain   . Anxiety state, unspecified   . Contact dermatitis and other eczema due to other chemical products   . Insomnia, unspecified   . Herpes zoster with unspecified complication   . Lumbago   . Lumbago   . Pure hyperglyceridemia   . Other abnormal blood chemistry   . Obesity, unspecified   . Tobacco use disorder   . Other headache syndromes(339.89)   . Unspecified essential hypertension   . Asymptomatic varicose veins   . Hemorrhoids, external   . Allergic rhinitis due to pollen   . 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 Osteoarthrosis, unspecified whether generalized or localized, unspecified site   . Unspecified sleep apnea   . Rash and other nonspecific skin eruption   . Headache(784.0)   . Flatulence, eructation, and gas pain   . Routine general medical examination at a health care facility     Past Surgical History  Procedure Laterality Date  . Hemorrhoid surgery    . Varicose vein surgery      There were no vitals taken for this visit.  Visit Diagnosis:  Pain in neck  Shoulder  stiffness, left  Pain in left shoulder      Subjective Assessment - 08/04/14 0753    Symptoms I'm feeling a little worse in that left shoulder.  Very sensitive to light touch.   Diagnostic tests xrays revealed bone spurs and arthritis   Patient Stated Goals improve pain, upper body dressing without pain   Currently in Pain? Yes   Pain Score 6    Pain Location Neck   Pain Orientation Left   Pain Descriptors / Indicators Constant   Pain Type Chronic pain   Aggravating Factors  lower left shoulder; reaching up    Pain Relieving Factors Patient unable to identify any relieving factors                    [facility] Adult PT Treatment/Exercise - 08/04/14 0846    Exercises   Exercises Neck   Neck Exercises: Stretches   Upper Trapezius Stretch 5 reps;30 seconds   Levator Stretch 3 reps;30 seconds   Neck Exercises: Seated   Postural Training --  Scapular retraction 10x   Modalities   Modalities Moist Heat   Moist Heat Therapy   Number Minutes Moist Heat 12 Minutes   Moist Heat Location --  cervical and upper thoracic   Manual Therapy   Manual Therapy Joint mobilization;Myofascial release;Scapular mobilization   Joint Mobilization --  C3-C7 P-A, lateral glides R/L  grade 3 15 sec each; distract   Myofascial Release --  upper traps, levator scap, rhomboids, subscapularis   Scapular Mobilization --  Scapular mobs med/lat and sup/inf grade 3 10x          Trigger Point Dry Needling - 08/04/14 0847    Consent Given? Yes   Education Handout Provided Yes   Muscles Treated Upper Body Upper trapezius;Oblique capitus;Levator scapulae;Rhomboids;Subscapularis   Upper Trapezius Response Palpable increased muscle length   Oblique Capitus Response Palpable increased muscle length   Levator Scapulae Response Palpable increased muscle length   Rhomboids Response Palpable increased muscle length   Subscapularis Response Palpable increased muscle length     Patient aware of dry  needling risks including pneumothorax and it's signs/symptoms/seek ER treatment.         PT Education - 08/04/14 0848    Education provided Yes   Education Details upper trap and levator stretch; scap retract; dry needling info and discussion of needling risks including signs/symptoms of pneumothorax   Person(s) Educated Patient   Methods Explanation;Demonstration;Handout   Comprehension Verbalized understanding;Returned demonstration             PT Long Term Goals - 08/04/14 0904    PT LONG TERM GOAL #1   Title independent with HEP (09/08/14)   Time 6   Period Weeks   Status On-going   PT LONG TERM GOAL #2   Title report pain < 3/10 at rest for improved symptoms (09/08/14)   Time 6   Period Weeks   Status On-going   PT LONG TERM GOAL #3   Title improve L shoulder flexion and abduction by 10 degrees each for improved function (09/08/14)   Time 6   Period Weeks   Status On-going   PT LONG TERM GOAL #4   Title report ability to donn sweatshirt without pain or compensations (09/08/14)   Time 6   Period Weeks   Status On-going               Plan - 08/04/14 0850    Clinical Impression Statement Patient states, "I already feel better" following treatment session.  Multiple left shoulder/scapular trigger points in addition to cervical joint hypomobility.  Patient would benefit from continuation of treatment plan including manual interventions, dry needling and postural strengthening.  No goals met yet secondary to only 2nd visit but anticipate steady progression with meeting goals.    PT Next Visit Plan Assess response to dry needling and continue with left upper trap, levator, rhomboid needling and manual therapy cervical spine as well.  Assess HEP for trap stretching and scap retract and progress        Problem List Patient Active Problem List   Diagnosis Date Noted  . Obesity (BMI 30-39.9) 12/22/2013  . 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  .  Hypertriglyceridemia 09/11/2013  . Depression 09/11/2013  . Insomnia 09/11/2013  . Generalized anxiety disorder 09/11/2013  . Dry mouth 09/11/2013  . Hyperglycemia 09/11/2013  . Hyperlipidemia LDL goal < 100 10/28/2012  . Tobacco use disorder 10/28/2012  . Essential hypertension, benign 10/28/2012  . 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 Obesity, unspecified 10/28/2012    Alvera Singh 08/04/2014, 9:06 AM  [facility] [address] [place], [place], [address] Phone: [phone]   Fax:  [phone]   Ruben Im, [place] 08/04/2014 9:08 AM Phone: [phone] Fax: [phone]

## 2014-08-04 NOTE — Patient Instructions (Signed)
Levator Stretch   Grasp seat or sit on hand on side to be stretched. Turn head toward other side and look down. Use hand on head to gently stretch neck in that position. Hold __30__ seconds. Repeat on other side. Repeat __3__ times. Do _2___ sessions per day.  http://gt2.exer.us/30   Copyright  VHI. All rights reserved.  Side-Bending   One hand on opposite side of head, pull head to side as far as is comfortable. Stop if there is pain. Hold _30___ seconds. Repeat with other hand to other side. Repeat __3__ times. Do ___2_ sessions per day.   Copyright  VHI. All rights reserved.  Scapular Retraction (Standing)   With arms at sides, pinch shoulder blades together. Repeat __15__ times per set. Do __1__ sets per session. Do ____2 sessions per day.  http://orth.exer.us/944   Copyright  VHI. All rights reserved.

## 2014-08-06 ENCOUNTER — Telehealth: Payer: Self-pay | Admitting: *Deleted

## 2014-08-06 ENCOUNTER — Ambulatory Visit: Payer: Medicare Other | Admitting: Physical Therapy

## 2014-08-06 ENCOUNTER — Other Ambulatory Visit: Payer: Self-pay | Admitting: *Deleted

## 2014-08-06 DIAGNOSIS — M25512 Pain in left shoulder: Secondary | ICD-10-CM | POA: Diagnosis not present

## 2014-08-06 DIAGNOSIS — M542 Cervicalgia: Secondary | ICD-10-CM

## 2014-08-06 DIAGNOSIS — M25612 Stiffness of left shoulder, not elsewhere classified: Secondary | ICD-10-CM | POA: Diagnosis not present

## 2014-08-06 DIAGNOSIS — M62838 Other muscle spasm: Secondary | ICD-10-CM

## 2014-08-06 MED ORDER — METAXALONE 800 MG PO TABS: 800.0000 mg | ORAL_TABLET | Freq: Three times a day (TID) | ORAL | Status: DC

## 2014-08-06 NOTE — Telephone Encounter (Signed)
Patient requested to be faxed to pharmacy 

## 2014-08-06 NOTE — Telephone Encounter (Signed)
APPTS MADE AND PRINTED...TD 

## 2014-08-06 NOTE — Therapy (Signed)
[place], [place], [address] Phone: [phone]   Fax:  [phone]  Physical Therapy Treatment  Patient Details  Name: David [name] MRN: [id number] Date of Birth: [birth year] Referring Provider:  Gayland Curry, DO  Encounter Date: 08/06/2014      PT End of Session - 08/06/14 0928    Visit Number 3   Number of Visits 12   Date for PT Re-Evaluation 09/26/14   PT Start Time 0846   PT Stop Time 0944   PT Time Calculation (min) 58 min   Equipment Utilized During Treatment --  Red Theraband   Activity Tolerance Patient tolerated treatment well   Behavior During Therapy [facility] for tasks assessed/performed      Past Medical History  Diagnosis Date  . Atherosclerosis of native arteries of the extremities, unspecified   . Myalgia and myositis, unspecified   . Unspecified hypertrophic and atrophic condition of skin   . Hyperpotassemia   . Nocturia   . Rotator cuff (capsule) sprain   . Anxiety state, unspecified   . Contact dermatitis and other eczema due to other chemical products   . Insomnia, unspecified   . Herpes zoster with unspecified complication   . Lumbago   . Lumbago   . Pure hyperglyceridemia   . Other abnormal blood chemistry   . Obesity, unspecified   . Tobacco use disorder   . Other headache syndromes(339.89)   . Unspecified essential hypertension   . Asymptomatic varicose veins   . Hemorrhoids, external   . Allergic rhinitis due to pollen   . 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 Osteoarthrosis, unspecified whether generalized or localized, unspecified site   . Unspecified sleep apnea   . Rash and other nonspecific skin eruption   . Headache(784.0)   . Flatulence, eructation, and gas pain   . Routine general medical examination at a health care facility     Past Surgical History  Procedure Laterality Date  . Hemorrhoid surgery    . Varicose  vein surgery      There were no vitals taken for this visit.  Visit Diagnosis:  Pain in neck  Shoulder stiffness, left  Pain in left shoulder      Subjective Assessment - 08/06/14 0850    Symptoms I am feeling tired because father fell and hit head. Pt states he was at Emergency room with father late last night.  Pt did say dry needling has decreased pain but he is sore from being with father at hospital   Limitations House hold activities   Diagnostic tests xrays revealed bone spurs and arthritis   Patient Stated Goals improve pain, upper body dressing without pain   Pain Score 6    Pain Location Neck  no pain in lower trap   Pain Orientation Left   Pain Descriptors / Indicators Constant;Aching   Pain Type Chronic pain   Pain Onset More than a month ago   Pain Frequency Intermittent   Aggravating Factors  helping father that had fallen and went to ER.  Sitting /Sleeping in hospital furniture          [facility] PT Assessment - 08/06/14 0915    AROM   Cervical - Right Rotation 55   Cervical - Left Rotation 48                  [facility] Adult PT Treatment/Exercise - 08/06/14 0857    Posture/Postural  Control   Posture/Postural Control Postural limitations   Postural Limitations Rounded Shoulders;Forward head;Increased thoracic kyphosis;Anterior pelvic tilt   Posture Comments obesity   Exercises   Exercises Neck   Neck Exercises: Stretches   Upper Trapezius Stretch 30 seconds;3 reps  reviewed with VC   Levator Stretch 30 seconds;2 reps  reviewed with VC   Neck Exercises: Theraband   Scapula Retraction 10 reps  VC   Shoulder Extension 10 reps;Red  x2 with VC   Rows 10 reps;Red   x2 with VC and TC   Shoulder External Rotation 10 reps;Red  x2 with VC and TC   Neck Exercises: Seated   Postural Training --  Scapular retraction 10x   Modalities   Modalities Moist Heat   Moist Heat Therapy   Number Minutes Moist Heat 15 Minutes   Moist Heat Location --   cervical and upper thoracic   Manual Therapy   Manual Therapy Joint mobilization;Myofascial release   Joint Mobilization Pt with PA glides of C-3 to T-10 very hypomobile   Myofascial Release anterior scalene and and trap/levator/Left          Trigger Point Dry Needling - 08/06/14 0911    Consent Given? Yes   Education Handout Provided No  previously given   Muscles Treated Upper Body Upper trapezius;Oblique capitus;Levator scapulae;Rhomboids;Subscapularis  left side only  also left anterior scalene with twitch respo   Upper Trapezius Response Palpable increased muscle length  less pain in trap 3/10 left   Oblique Capitus Response Palpable increased muscle length  left   Levator Scapulae Response Twitch response elicited;Palpable increased muscle length  left   Rhomboids Response Palpable increased muscle length  left   Subscapularis Response Palpable increased muscle length  left              PT Education - 08/06/14 0945    Education Details Given HEP for Shoulder bil extension, bil rows and bil External Rotation with Theraband for strengthening. Reviewed Neck stretches   Person(s) Educated Patient   Methods Explanation;Demonstration;Tactile cues;Verbal cues;Handout   Comprehension Verbalized understanding;Returned demonstration;Need further instruction             PT Long Term Goals - 08/06/14 1013    PT LONG TERM GOAL #1   Title independent with HEP (09/08/14)   Time 6   Period Weeks   Status On-going   PT LONG TERM GOAL #2   Title report pain < 3/10 at rest for improved symptoms (09/08/14)  Pt was a 3/10 after treatment today but has not had time to discover lasting benefit   Time 6   Period Weeks   Status On-going   PT LONG TERM GOAL #3   Title improve L shoulder flexion and abduction by 10 degrees each for improved function (09/08/14)   Time 6   Period Weeks   Status On-going   PT LONG TERM GOAL #4   Title report ability to donn sweatshirt without  pain or compensations (09/08/14)   Time 6   Period Weeks   Status On-going               Plan - 08/06/14 0929    Clinical Impression Statement Pt has benefitted from DN but pt had to take father who fell and had concussion to the hospital from the night before, so pt is improved with DN but remained at 6/10 initially. Pt benefited from treatmenat with DN and manual therapy and was reduced to a 3/10 after tratment  today   Pt will benefit from skilled therapeutic intervention in order to improve on the following deficits Impaired perceived functional ability;Decreased strength;Increased muscle spasms;Pain;Postural dysfunction;Improper body mechanics;Decreased range of motion   PT Frequency 2x / week   PT Duration 6 weeks   PT Treatment/Interventions ADLs/Self Care Home Management;Cryotherapy;Electrical Stimulation;Functional mobility training;Neuromuscular re-education;Ultrasound;Manual techniques;Dry needling;Passive range of motion;Therapeutic exercise;Moist Heat;Therapeutic activities;Patient/family education   PT Next Visit Plan Assess response to dry needling especially levator/upper trap and anterior scalene on Left, manual therapy for cervical and upper thoracic.  Review HEP   Consulted and Agree with Plan of Care Patient        Problem List Patient Active Problem List   Diagnosis Date Noted  . Obesity (BMI 30-39.9) 12/22/2013  . 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  . Hypertriglyceridemia 09/11/2013  . Depression 09/11/2013  . Insomnia 09/11/2013  . Generalized anxiety disorder 09/11/2013  . Dry mouth 09/11/2013  . Hyperglycemia 09/11/2013  . Hyperlipidemia LDL goal < 100 10/28/2012  . Tobacco use disorder 10/28/2012  . Essential hypertension, benign 10/28/2012  . 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 Obesity, unspecified 10/28/2012    Voncille Lo, PT 08/06/2014 10:18 AM Phone: [phone] Fax: [facility] [place], [place], [address] Phone: [phone]   Fax:  [phone]

## 2014-08-06 NOTE — Patient Instructions (Signed)
EXTENSION: Standing - Resistance Band: Stable (Active)   Stand, right arm at side. Against a red thera band, draw arm backward, as far as possible, keeping elbow straight. Complete _3__ sets of _10__ repetitions. Perform _1__ sessions per day. Do both arms at same time.  Copyright  VHI. All rights reserved.  Row: Mid-Range - Standing   http://ss.exer.us/290   Copyright  VHI. All rights reserved.  Resistive Band Rowing   With red  resistive band anchored in door, grasp both ends. Keeping elbows bent and by your side, pull back, squeezing shoulder blades together. Hold __3__ seconds. Repeat ___10 x3_ times. Do _1__ sessions per day.   .External Rotation of Shoulder  with Red Theraband - Elbows at side.  As shown in clinic.  Be sure to depress your shoulder before beginning exercise Begin by wrapping theraband around both hands at shoulder width with thumbs up.   3 x 10 reps hold for 3 sec each 1 x a day.  http://gt2.exer.us/97   Copyright  VHI. All rights reserved.

## 2014-08-11 ENCOUNTER — Ambulatory Visit: Payer: Medicare Other | Admitting: Physical Therapy

## 2014-08-11 ENCOUNTER — Ambulatory Visit: Payer: Medicare Other | Attending: Internal Medicine | Admitting: Physical Therapy

## 2014-08-11 DIAGNOSIS — M25512 Pain in left shoulder: Secondary | ICD-10-CM

## 2014-08-11 DIAGNOSIS — M542 Cervicalgia: Secondary | ICD-10-CM | POA: Insufficient documentation

## 2014-08-11 DIAGNOSIS — M25612 Stiffness of left shoulder, not elsewhere classified: Secondary | ICD-10-CM | POA: Insufficient documentation

## 2014-08-11 NOTE — Therapy (Signed)
[place], [place], [address] Phone: [phone]   Fax:  [phone]  Physical Therapy Treatment  Patient Details  Name: ANTHON HARPOLE MRN: [id number] Date of Birth: 04-Jul-1946 Referring Provider:  Gayland Curry, DO  Encounter Date: 08/11/2014      PT End of Session - 08/11/14 1424    Visit Number 4   Number of Visits 12   Date for PT Re-Evaluation 09/26/14   PT Start Time 1100   PT Stop Time 1152   PT Time Calculation (min) 52 min   Activity Tolerance Patient tolerated treatment well      Past Medical History  Diagnosis Date  . Atherosclerosis of native arteries of the extremities, unspecified   . Myalgia and myositis, unspecified   . Unspecified hypertrophic and atrophic condition of skin   . Hyperpotassemia   . Nocturia   . Rotator cuff (capsule) sprain   . Anxiety state, unspecified   . Contact dermatitis and other eczema due to other chemical products   . Insomnia, unspecified   . Herpes zoster with unspecified complication   . Lumbago   . Lumbago   . Pure hyperglyceridemia   . Other abnormal blood chemistry   . Obesity, unspecified   . Tobacco use disorder   . Other headache syndromes(339.89)   . Unspecified essential hypertension   . Asymptomatic varicose veins   . Hemorrhoids, external   . Allergic rhinitis due to pollen   . 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 Osteoarthrosis, unspecified whether generalized or localized, unspecified site   . Unspecified sleep apnea   . Rash and other nonspecific skin eruption   . Headache(784.0)   . Flatulence, eructation, and gas pain   . Routine general medical examination at a health care facility     Past Surgical History  Procedure Laterality Date  . Hemorrhoid surgery    . Varicose vein surgery      There were no vitals taken for this visit.  Visit Diagnosis:  Pain in neck  Shoulder  stiffness, left  Pain in left shoulder      Subjective Assessment - 08/11/14 1050    Symptoms Patient states his dad had a light stroke affecting his speech.  He also has rib fractures.  Patient is asleep in waiting room.  Left upper trap region pain.     Diagnostic tests xrays revealed bone spurs and arthritis   Patient Stated Goals improve pain, upper body dressing without pain   Currently in Pain? Yes   Pain Score 5    Pain Location Neck   Pain Orientation Left   Aggravating Factors  turning his head;  sleeping at the hospital some          [facility] PT Assessment - 08/11/14 1105    AROM   Left Shoulder Flexion 134 Degrees   Cervical - Right Side Bend 32   Cervical - Left Side Bend 25   Cervical - Right Rotation 63   Cervical - Left Rotation 55                  [facility] Adult PT Treatment/Exercise - 08/11/14 1417    Neck Exercises: Stretches   Upper Trapezius Stretch --  review from previous HEP   Neck Exercises: Theraband   Scapula Retraction 10 reps;Red   Shoulder Extension Red   Shoulder External Rotation --  Verbal review from previous HEP   Modalities  Modalities Moist Heat   Moist Heat Therapy   Number Minutes Moist Heat 12 Minutes   Moist Heat Location --  upper trap and left scapular region   Manual Therapy   Manual Therapy Joint mobilization;Myofascial release;Scapular mobilization;Manual Traction   Joint Mobilization --  C3-C7 PA and lateral glides grade 3 20 sec ea level   Myofascial Release --  left upper traps, rhomboids,infraspinatus, levator scap   Scapular Mobilization --  Left scapular distract,med/lat and inf/sup grade 3 10x   Passive ROM --  Left GH distraction and inferior mob with movement 10x   Manual Traction --  Grade 3 3x 30 sec holds          Trigger Point Dry Needling - 08/11/14 1422    Consent Given? Yes   Muscles Treated Upper Body Upper trapezius;Levator scapulae;Rhomboids;Infraspinatus   Upper Trapezius Response  Twitch reponse elicited   Levator Scapulae Response Twitch response elicited   Rhomboids Response Palpable increased muscle length   Infraspinatus Response Palpable increased muscle length              PT Education - 08/11/14 1423    Education provided Yes   Education Details theraband exercises (patient states he does not have written/pictoral copy)   Person(s) Educated Patient   Methods Explanation;Handout   Comprehension Verbalized understanding             PT Long Term Goals - 08/11/14 1430    PT LONG TERM GOAL #1   Title independent with HEP (09/08/14)   Time 6   Period Weeks   Status On-going   PT LONG TERM GOAL #2   Title report pain < 3/10 at rest for improved symptoms (09/08/14)   Time 6   Period Weeks   Status On-going   PT LONG TERM GOAL #3   Title improve L shoulder flexion and abduction by 10 degrees each for improved function (09/08/14)   Time 6   Period Weeks   Status On-going   PT LONG TERM GOAL #4   Title report ability to donn sweatshirt without pain or compensations (09/08/14)   Time 6   Period Weeks   Status On-going               Plan - 08/11/14 1425    Clinical Impression Statement Patient states he feels that current treatment is helping although the stress of his father's health problems is contributing to his pain especially in the left upper trapezius region. Patient's lack of sleep and sleeping in awkward positions at the hospital may also be contributing to his pain.   Cervical rotation improving although sidebending still quite stiff.  Therapist monitoring patient's pain throughout treatment session and interventions modified accordingly.  Progressing with goals although none met yet.  Still painful with putting on a pullover shirt.     PT Next Visit Plan Assess response to dry needling especially levator/upper trap and left infraspinatus;   manual therapy for cervical and upper thoracic.          Problem List Patient Active  Problem List   Diagnosis Date Noted  . Obesity (BMI 30-39.9) 12/22/2013  . 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  . Hypertriglyceridemia 09/11/2013  . Depression 09/11/2013  . Insomnia 09/11/2013  . Generalized anxiety disorder 09/11/2013  . Dry mouth 09/11/2013  . Hyperglycemia 09/11/2013  . Hyperlipidemia LDL goal < 100 10/28/2012  . Tobacco use disorder 10/28/2012  . Essential hypertension, benign 10/28/2012  . Osteoarthritis of basilar joint of  thumb 10/28/2012  . Obesity, unspecified 10/28/2012    Alvera Singh 08/11/2014, 2:33 PM  [facility] [address] [place], [place], [address] Phone: [phone]   Fax:  [phone]   Ruben Im, PT 08/11/2014 2:33 PM Phone: [phone] Fax: [phone]

## 2014-08-11 NOTE — Patient Instructions (Signed)
EXTENSION: Standing - Resistance Band: Stable (Active)   Stand, right arm at side. Against yellow resistance band, draw arm backward, as far as possible, keeping elbow straight. Complete _1__ sets of _15__ repetitions. Perform 1___ sessions per day.  Copyright  VHI. All rights reserved.  Row: Mid-Range - Standing   With yellow band anchored at chest level, pull elbows backward, squeezing shoulder blades together. Keep head and spine neutral. Row __15_ times, __1_ times per day.  http://ss.exer.us/290   Copyright  VHI. All rights reserved.  Resistive Band Rowing   With resistive band anchored in door, grasp both ends. Keeping elbows bent, pull back, squeezing shoulder blades together. Hold _2___ seconds. Repeat __15__ times. Do _1___ sessions per day.  http://gt2.exer.us/97   Copyright  VHI. All rights reserved.

## 2014-08-13 ENCOUNTER — Ambulatory Visit: Payer: Medicare Other | Admitting: Physical Therapy

## 2014-08-13 DIAGNOSIS — M25612 Stiffness of left shoulder, not elsewhere classified: Secondary | ICD-10-CM

## 2014-08-13 DIAGNOSIS — M542 Cervicalgia: Secondary | ICD-10-CM | POA: Diagnosis not present

## 2014-08-13 DIAGNOSIS — M25512 Pain in left shoulder: Secondary | ICD-10-CM | POA: Diagnosis not present

## 2014-08-13 NOTE — Therapy (Signed)
[place], [place], [address] Phone: [phone]   Fax:  [phone]  Physical Therapy Treatment  Patient Details  Name: David [name] MRN: [id number] Date of Birth: Jan 15, 1946 Referring Provider:  Gayland Curry, DO  Encounter Date: 08/13/2014      PT End of Session - 08/13/14 0939    Visit Number 5   Number of Visits 12   Date for PT Re-Evaluation 09/26/14   PT Start Time 0845   PT Stop Time 0942   PT Time Calculation (min) 57 min   Activity Tolerance Patient tolerated treatment well      Past Medical History  Diagnosis Date  . Atherosclerosis of native arteries of the extremities, unspecified   . Myalgia and myositis, unspecified   . Unspecified hypertrophic and atrophic condition of skin   . Hyperpotassemia   . Nocturia   . Rotator cuff (capsule) sprain   . Anxiety state, unspecified   . Contact dermatitis and other eczema due to other chemical products   . Insomnia, unspecified   . Herpes zoster with unspecified complication   . Lumbago   . Lumbago   . Pure hyperglyceridemia   . Other abnormal blood chemistry   . Obesity, unspecified   . Tobacco use disorder   . Other headache syndromes(339.89)   . Unspecified essential hypertension   . Asymptomatic varicose veins   . Hemorrhoids, external   . Allergic rhinitis due to pollen   . 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 Osteoarthrosis, unspecified whether generalized or localized, unspecified site   . Unspecified sleep apnea   . Rash and other nonspecific skin eruption   . Headache(784.0)   . Flatulence, eructation, and gas pain   . Routine general medical examination at a health care facility     Past Surgical History  Procedure Laterality Date  . Hemorrhoid surgery    . Varicose vein surgery      There were no vitals taken for this visit.  Visit Diagnosis:  Pain in neck  Shoulder  stiffness, left  Pain in left shoulder      Subjective Assessment - 08/13/14 0851    Symptoms Patient's father passed away this morning.  Patient very groggy and dozing in the waiting room.  Didn't sleep much last night and patient states that is probably why his (upper trap) neck is so painful today.  Overall he feels current treatment is helping a lot.     Currently in Pain? Yes   Pain Score 8    Pain Location Neck   Pain Orientation Left   Pain Descriptors / Indicators Constant   Pain Type Chronic pain                    [facility] Adult PT Treatment/Exercise - 08/13/14 0932    Neck Exercises: Supine   Neck Retraction 10 reps   Capital Flexion 10 reps   Moist Heat Therapy   Number Minutes Moist Heat 15 Minutes   Moist Heat Location --  prone neck and left scapula   Manual Therapy   Manual Therapy Joint mobilization;Myofascial release;Scapular mobilization;Manual Traction;Other (comment)   Joint Mobilization --  Cervical lateral glides and PA grade 20 sec   Myofascial Release --  upper traps, levator, rhomboids   Scapular Mobilization --  med/lat and sup/inf glides grade 3 10x   Passive ROM --  upper trap contract/relax 5x 5 sec hold  Manual Traction --  cervical 30 sec 4x   Other Manual Therapy --  Kinesiotaping V formation left upper trap          Trigger Point Dry Needling - 08/13/14 0937    Consent Given? Yes   Muscles Treated Upper Body Upper trapezius;Levator scapulae;Rhomboids   Upper Trapezius Response Twitch reponse elicited   Levator Scapulae Response Twitch response elicited   Rhomboids Response Palpable increased muscle length                   PT Long Term Goals - 08/13/14 0943    PT LONG TERM GOAL #1   Title independent with HEP (09/08/14)   Time 6   Period Weeks   Status On-going   PT LONG TERM GOAL #2   Title report pain < 3/10 at rest for improved symptoms (09/08/14)   Time 6   Period Weeks   Status On-going   PT LONG  TERM GOAL #3   Title improve L shoulder flexion and abduction by 10 degrees each for improved function (09/08/14)   Time 6   Period Weeks   Status On-going   PT LONG TERM GOAL #4   Title report ability to donn sweatshirt without pain or compensations (09/08/14)   Time 6   Period Weeks   Status On-going               Plan - 08/13/14 3491    Clinical Impression Statement Patient with multiple trigger points in left upper traps, levator and rhomboids.  Symptoms may be intensified during the stress (and now death) of his father.  Patient has not been sleeping well.  Therapist monitoring response throughtout treatment session.    PT Next Visit Plan Assess response to dry needling especially levator/upper trap and   manual therapy;  recheck cervical  and shoulder AROM next visit and progress toward goals.          Problem List Patient Active Problem List   Diagnosis Date Noted  . Obesity (BMI 30-39.9) 12/22/2013  . 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  . Hypertriglyceridemia 09/11/2013  . Depression 09/11/2013  . Insomnia 09/11/2013  . Generalized anxiety disorder 09/11/2013  . Dry mouth 09/11/2013  . Hyperglycemia 09/11/2013  . Hyperlipidemia LDL goal < 100 10/28/2012  . Tobacco use disorder 10/28/2012  . Essential hypertension, benign 10/28/2012  . 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 Obesity, unspecified 10/28/2012    Alvera Singh 08/13/2014, 9:45 AM  [facility] [address] [place], [place], [address] Phone: [phone]   Fax:  [phone]   Ruben Im, PT 08/13/2014 9:45 AM Phone: [phone] Fax: [phone]

## 2014-08-18 ENCOUNTER — Ambulatory Visit: Payer: Medicare Other | Admitting: Physical Therapy

## 2014-08-18 DIAGNOSIS — M25512 Pain in left shoulder: Secondary | ICD-10-CM | POA: Diagnosis not present

## 2014-08-18 DIAGNOSIS — M25612 Stiffness of left shoulder, not elsewhere classified: Secondary | ICD-10-CM | POA: Diagnosis not present

## 2014-08-18 DIAGNOSIS — M542 Cervicalgia: Secondary | ICD-10-CM

## 2014-08-18 DIAGNOSIS — R293 Abnormal posture: Secondary | ICD-10-CM

## 2014-08-18 NOTE — Patient Instructions (Addendum)
Pt given Scapular Theraband exercise with Red T band handout  External Rotation with elbow at side and with thumbs up and out and shoulders depressed   2 x 10 reps with   Diagonals- with red T band  2 x 10  Horizontal Abduction as on sheet given with Red T band 2 x 10    Do exercises 1 -2 times daily   Continue stretches and exercises as given before  Voncille Lo, PT 08/18/2014 8:46 AM Phone: [phone] Fax: [phone]

## 2014-08-18 NOTE — Therapy (Signed)
[place], [place], [address] Phone: [phone]   Fax:  [phone]  Physical Therapy Treatment  Patient Details  Name: David [name] MRN: [id number] Date of Birth: [birth year] Referring Provider:  Gayland Curry, DO  Encounter Date: 08/18/2014      PT End of Session - 08/18/14 0850    Visit Number 6   Number of Visits 12   Date for PT Re-Evaluation 09/26/14   PT Start Time 0800   PT Stop Time 0902   PT Time Calculation (min) 62 min   Activity Tolerance Patient tolerated treatment well   Behavior During Therapy [facility] for tasks assessed/performed      Past Medical History  Diagnosis Date  . Atherosclerosis of native arteries of the extremities, unspecified   . Myalgia and myositis, unspecified   . Unspecified hypertrophic and atrophic condition of skin   . Hyperpotassemia   . Nocturia   . Rotator cuff (capsule) sprain   . Anxiety state, unspecified   . Contact dermatitis and other eczema due to other chemical products   . Insomnia, unspecified   . Herpes zoster with unspecified complication   . Lumbago   . Lumbago   . Pure hyperglyceridemia   . Other abnormal blood chemistry   . Obesity, unspecified   . Tobacco use disorder   . Other headache syndromes(339.89)   . Unspecified essential hypertension   . Asymptomatic varicose veins   . Hemorrhoids, external   . Allergic rhinitis due to pollen   . 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 Osteoarthrosis, unspecified whether generalized or localized, unspecified site   . Unspecified sleep apnea   . Rash and other nonspecific skin eruption   . Headache(784.0)   . Flatulence, eructation, and gas pain   . Routine general medical examination at a health care facility     Past Surgical History  Procedure Laterality Date  . Hemorrhoid surgery    . Varicose vein surgery      There were no vitals taken for this  visit.  Visit Diagnosis:  Pain in neck  Shoulder stiffness, left  Pain in left shoulder  Abnormal posture      Subjective Assessment - 08/18/14 0802    Symptoms Pt buried father this past Sunday and has not had time to do exercises understandably.  Pt has benefited from dry needling and has reduced pain in left neck to 3/10. It is getting easier to pull shirts over head without intense pain   Limitations House hold activities   Diagnostic tests xrays revealed bone spurs and arthritis   Patient Stated Goals improve pain, upper body dressing without pain   Currently in Pain? Yes   Pain Score 3    Pain Location Neck   Pain Orientation Left   Pain Descriptors / Indicators Dull   Pain Type Chronic pain   Pain Onset More than a month ago   Pain Frequency Intermittent          [facility] PT Assessment - 08/18/14 0824    AROM   Left Shoulder Flexion 145 Degrees   Cervical - Right Side Bend 35   Cervical - Left Side Bend 32   Cervical - Right Rotation 63   Cervical - Left Rotation 60                  [facility] Adult PT Treatment/Exercise - 08/18/14 0810    Neck Exercises: Stretches  Upper Trapezius Stretch 2 reps;30 seconds  VC bil   Levator Stretch 2 reps;30 seconds  vc bil   Neck Exercises: Theraband   Shoulder External Rotation 10 reps;Red  x2 with VC and TC   Horizontal ABduction 10 reps;Red   x2 VC and TC   Other Theraband Exercises diagonals bil  10 x2 with VC and TC   Manual Therapy   Manual Therapy Joint mobilization;Myofascial release   Joint Mobilization spinous process T-4 to T-10 and costochondral ribs 4 to 6 left side only 5 to 10 sec bouts   Myofascial Release contract relax of left UT and Left levator  mFR to rohomboids and infra  Also MFR of rhomboids, supraspinatus, infraspinatus          Trigger Point Dry Needling - 08/18/14 3295    Consent Given? Yes  all Dry needling done on Left side   Education Handout Provided No  Pt already given  precautian before   Muscles Treated Upper Body Upper trapezius;Levator scapulae;Rhomboids;Infraspinatus;Supraspinatus;Subscapularis   Upper Trapezius Response Palpable increased muscle length   Levator Scapulae Response Twitch response elicited;Palpable increased muscle length   Rhomboids Response Palpable increased muscle length   Supraspinatus Response Palpable increased muscle length   Infraspinatus Response Palpable increased muscle length   Subscapularis Response Twitch response elicited;Palpable increased muscle length              PT Education - 08/18/14 0846    Education provided Yes   Education Details Pt given handout for scapular Theraband unattached with red tband ER, horiz abd and diagonals    Person(s) Educated Patient   Methods Explanation;Handout;Demonstration;Verbal cues   Comprehension Verbalized understanding;Returned demonstration;Verbal cues required             PT Long Term Goals - 08/18/14 0825    PT LONG TERM GOAL #1   Title independent with HEP (09/08/14)   Time 6   Period Weeks   Status On-going   PT LONG TERM GOAL #2   Title report pain < 3/10 at rest for improved symptoms (09/08/14)  Pt at 3/10 today but sometimes elevates with activity   Time 6   Period Weeks   Status On-going   PT LONG TERM GOAL #3   Title improve L shoulder flexion and abduction by 10 degrees each for improved function (09/08/14)   Time 6   Period Weeks   Status Achieved   PT LONG TERM GOAL #4   Title report ability to donn sweatshirt without pain or compensations (09/08/14)   Time 6   Period Weeks   Status On-going               Plan - 08/18/14 0853    Clinical Impression Statement Pt just buried his father this past Sunday so he has not been able to do many exercises.  He  has 3/10 pain most of the time except when he is reaching overhead.  Pt AROM in Left shoulder flex 145 improved Rotation to Right 63 and 60 in left.  Side bend imorved to Right 35 and to left  32.  goal achieved for improved shoulder arom   PT Next Visit Plan Pt benefits from Dry needling.  Review HEP for scapular unattached and with attache for scapular strengthening . Dry needle as needed.  mobs for left costochondral left ribs 4 to 6        Problem List Patient Active Problem List   Diagnosis Date Noted  . Obesity (BMI  30-39.9) 12/22/2013  . 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  . Hypertriglyceridemia 09/11/2013  . Depression 09/11/2013  . Insomnia 09/11/2013  . Generalized anxiety disorder 09/11/2013  . Dry mouth 09/11/2013  . Hyperglycemia 09/11/2013  . Hyperlipidemia LDL goal < 100 10/28/2012  . Tobacco use disorder 10/28/2012  . Essential hypertension, benign 10/28/2012  . 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 Obesity, unspecified 10/28/2012    Voncille Lo, PT 08/18/2014 9:06 AM Phone: [phone] Fax: [facility] [address] [place], [place], [address] Phone: [phone]   Fax:  [phone]

## 2014-08-20 ENCOUNTER — Ambulatory Visit: Payer: Medicare Other | Admitting: Physical Therapy

## 2014-08-20 DIAGNOSIS — R293 Abnormal posture: Secondary | ICD-10-CM

## 2014-08-20 DIAGNOSIS — M542 Cervicalgia: Secondary | ICD-10-CM

## 2014-08-20 DIAGNOSIS — M25612 Stiffness of left shoulder, not elsewhere classified: Secondary | ICD-10-CM | POA: Diagnosis not present

## 2014-08-20 DIAGNOSIS — M25512 Pain in left shoulder: Secondary | ICD-10-CM

## 2014-08-20 NOTE — Therapy (Signed)
[place], [place], [address] Phone: [phone]   Fax:  [phone]  Physical Therapy Treatment  Patient Details  Name: David [name] MRN: [id number] Date of Birth: January 08, 1946 Referring Provider:  Gayland Curry, DO  Encounter Date: 08/20/2014      PT End of Session - 08/20/14 1334    Visit Number 7   Number of Visits 12   Date for PT Re-Evaluation 09/26/14   PT Start Time 0754   PT Stop Time 0853   PT Time Calculation (min) 59 min   Activity Tolerance Patient tolerated treatment well      Past Medical History  Diagnosis Date  . Atherosclerosis of native arteries of the extremities, unspecified   . Myalgia and myositis, unspecified   . Unspecified hypertrophic and atrophic condition of skin   . Hyperpotassemia   . Nocturia   . Rotator cuff (capsule) sprain   . Anxiety state, unspecified   . Contact dermatitis and other eczema due to other chemical products   . Insomnia, unspecified   . Herpes zoster with unspecified complication   . Lumbago   . Lumbago   . Pure hyperglyceridemia   . Other abnormal blood chemistry   . Obesity, unspecified   . Tobacco use disorder   . Other headache syndromes(339.89)   . Unspecified essential hypertension   . Asymptomatic varicose veins   . Hemorrhoids, external   . Allergic rhinitis due to pollen   . 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 Osteoarthrosis, unspecified whether generalized or localized, unspecified site   . Unspecified sleep apnea   . Rash and other nonspecific skin eruption   . Headache(784.0)   . Flatulence, eructation, and gas pain   . Routine general medical examination at a health care facility     Past Surgical History  Procedure Laterality Date  . Hemorrhoid surgery    . Varicose vein surgery      There were no vitals taken for this visit.  Visit Diagnosis:  Pain in neck  Shoulder  stiffness, left  Pain in left shoulder  Abnormal posture      Subjective Assessment - 08/20/14 0754    Symptoms Patient dozing in waiting room.  States he has insomnia and is unable to go to sleep until 3 AM.  Not pain related.  Left neck pain.  Not nearly as  bad as when I started.     Currently in Pain? Yes   Pain Score 3    Pain Location Neck   Pain Orientation Left   Pain Onset More than a month ago   Aggravating Factors  taking his shirt on/off   Pain Relieving Factors combination of it all                    [facility] Adult PT Treatment/Exercise - 08/20/14 0808    Neck Exercises: Theraband   Other Theraband Exercises Rockwoods red band 10x each   Neck Exercises: Prone   Axial Exentsion 10 reps  head lift with shoulder extension and HABD    Moist Heat Therapy   Number Minutes Moist Heat 12 Minutes   Moist Heat Location --  cervical and left scapula   Manual Therapy   Manual Therapy Joint mobilization   Joint Mobilization C3-C7 PA and lateral glides grade 3 2x 20 sec each level; GH left distraction, inferior mobs with movement shoulder flexion 10x   Myofascial Release  left upper trap, levator scap, rhomboids, subscapularis   Manual Traction 3 x 30 sec holds          Trigger Point Dry Needling - 08/20/14 1343    Consent Given? Yes   Muscles Treated Upper Body Upper trapezius;Rhomboids;Levator scapulae;Subscapularis  cervical multifidi left   Upper Trapezius Response Twitch reponse elicited   Oblique Capitus Response Palpable increased muscle length   Levator Scapulae Response Twitch response elicited   Rhomboids Response Palpable increased muscle length   Subscapularis Response Palpable increased muscle length                   PT Long Term Goals - 08/20/14 1347    PT LONG TERM GOAL #1   Title independent with HEP (09/08/14)   Time 6   Period Weeks   Status On-going   PT LONG TERM GOAL #2   Title report pain < 3/10 at rest for improved  symptoms (09/08/14)   Time 6   Period Weeks   Status Partially Met   PT LONG TERM GOAL #3   Title improve L shoulder flexion and abduction by 10 degrees each for improved function (09/08/14)   Status Achieved   PT LONG TERM GOAL #4   Title report ability to donn sweatshirt without pain or compensations (09/08/14)   Time 6   Period Weeks   Status On-going               Plan - 08/20/14 1336    Clinical Impression Statement The patient reports his pain level is improving overall although taking a pullover shirt on/off is still quite painful.  Modified exercises (especially Rockwood's) secondary to pain.  Improved muscle length and spasm post treatment session.     PT Next Visit Plan Assess continued response to dry needling and manual techniques.  Continue left GH, scapular and cervical strengthening.  Recheck shoulder AROM and cervical AROM next visit.        Problem List Patient Active Problem List   Diagnosis Date Noted  . Obesity (BMI 30-39.9) 12/22/2013  . 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  . Hypertriglyceridemia 09/11/2013  . Depression 09/11/2013  . Insomnia 09/11/2013  . Generalized anxiety disorder 09/11/2013  . Dry mouth 09/11/2013  . Hyperglycemia 09/11/2013  . Hyperlipidemia LDL goal < 100 10/28/2012  . Tobacco use disorder 10/28/2012  . Essential hypertension, benign 10/28/2012  . 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 Obesity, unspecified 10/28/2012    Alvera Singh 08/20/2014, 1:50 PM  [facility] [address] [place], [place], [address] Phone: [phone]   Fax:  [phone]  Ruben Im, PT 08/20/2014 1:52 PM Phone: [phone] Fax: [phone]

## 2014-08-25 ENCOUNTER — Ambulatory Visit: Payer: Medicare Other | Admitting: Physical Therapy

## 2014-08-25 DIAGNOSIS — M542 Cervicalgia: Secondary | ICD-10-CM

## 2014-08-25 DIAGNOSIS — R293 Abnormal posture: Secondary | ICD-10-CM

## 2014-08-25 DIAGNOSIS — M25612 Stiffness of left shoulder, not elsewhere classified: Secondary | ICD-10-CM

## 2014-08-25 DIAGNOSIS — M25512 Pain in left shoulder: Secondary | ICD-10-CM

## 2014-08-25 NOTE — Patient Instructions (Signed)
Reviewed External rotation bilaterally with Red T band and Rock wood exercises

## 2014-08-25 NOTE — Therapy (Signed)
[place], [place], [address] Phone: [phone]   Fax:  [phone]  Physical Therapy Treatment  Patient Details  Name: David [name] MRN: [id number] Date of Birth: Aug 21, 1945 Referring Provider:  Gayland Curry, DO  Encounter Date: 08/25/2014      PT End of Session - 08/25/14 0841    Visit Number 8   Number of Visits 12   Date for PT Re-Evaluation 09/26/14      Past Medical History  Diagnosis Date  . Atherosclerosis of native arteries of the extremities, unspecified   . Myalgia and myositis, unspecified   . Unspecified hypertrophic and atrophic condition of skin   . Hyperpotassemia   . Nocturia   . Rotator cuff (capsule) sprain   . Anxiety state, unspecified   . Contact dermatitis and other eczema due to other chemical products   . Insomnia, unspecified   . Herpes zoster with unspecified complication   . Lumbago   . Lumbago   . Pure hyperglyceridemia   . Other abnormal blood chemistry   . Obesity, unspecified   . Tobacco use disorder   . Other headache syndromes(339.89)   . Unspecified essential hypertension   . Asymptomatic varicose veins   . Hemorrhoids, external   . Allergic rhinitis due to pollen   . 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 Osteoarthrosis, unspecified whether generalized or localized, unspecified site   . Unspecified sleep apnea   . Rash and other nonspecific skin eruption   . Headache(784.0)   . Flatulence, eructation, and gas pain   . Routine general medical examination at a health care facility     Past Surgical History  Procedure Laterality Date  . Hemorrhoid surgery    . Varicose vein surgery      There were no vitals taken for this visit.  Visit Diagnosis:  Pain in neck  Shoulder stiffness, left  Pain in left shoulder  Abnormal posture      Subjective Assessment - 08/25/14 0803    Symptoms Pt not feeling well,  may have the flu.  has not slept well last night or last 3  days. Pt has headache due to cold.  Pt with no pain with AROM of neck except with palpation over supraspinatus   Limitations House hold activities   Currently in Pain? Yes   Pain Score 3    Pain Location Neck  Pt points specifically to supraspinatus area    Pain Orientation Left   Pain Descriptors / Indicators Dull   Pain Type Chronic pain   Pain Onset More than a month ago   Pain Frequency Occasional          [facility] PT Assessment - 08/25/14 0814    AROM   Left Shoulder Flexion 155 Degrees   Left Shoulder ABduction 140 Degrees   Left Shoulder Internal Rotation 55 Degrees   Left Shoulder External Rotation 85 Degrees   Cervical Flexion 60   Cervical Extension 50   Cervical - Right Side Bend 40   Cervical - Left Side Bend 35   Cervical - Right Rotation 65   Cervical - Left Rotation 63                  [facility] Adult PT Treatment/Exercise - 08/25/14 0823    Neck Exercises: Theraband   Scapula Retraction 10 reps   Shoulder Extension Red;10 reps  VC   Horizontal ABduction 10 reps;Red  Bil VC   Other Theraband Exercises diagonals 10 x 2 Bil with VC   Other Theraband Exercises bil Rows 10 x 2   Moist Heat Therapy   Moist Heat Location --  cervical and upper back   Manual Therapy   Manual Therapy Joint mobilization;Myofascial release   Joint Mobilization UPA C-3 to C6  on left Also PA mobs T-2 to T-10 PA mobs   Myofascial Release contract release of Left UT and levator . Myofascial to Left Supraspinatus, Upper trap and levator    Passive ROM cervical PROM all planes with no pain          Trigger Point Dry Needling - 08/25/14 0819    Consent Given? Yes   Education Handout Provided No  previously given   Muscles Treated Upper Body Supraspinatus;Upper trapezius;Levator scapulae   C-3 to C-6 Left erector spinae   Upper Trapezius Response Twitch reponse elicited;Palpable increased muscle length   Levator  Scapulae Response Twitch response elicited;Palpable increased muscle length   Supraspinatus Response Palpable increased muscle length              PT Education - 08/25/14 0841    Education provided Yes   Education Details Pt reviewed Scapular Strength and rockwood    Person(s) Educated Patient   Methods Explanation;Demonstration;Verbal cues   Comprehension Verbalized understanding;Returned demonstration             PT Long Term Goals - 08/25/14 0811    PT LONG TERM GOAL #1   Title independent with HEP (09/08/14)   Time 6   Period Weeks   Status On-going   PT LONG TERM GOAL #2   Title Pt pain level 3/10 or less at rest  Pt is zero at rest and 3/10 with exertion   Time 6   Period Weeks   Status Achieved   PT LONG TERM GOAL #3   Title improve L shoulder flexion and abduction by 10 degrees each for improved function (09/08/14)   Time 6   Period Weeks   Status Achieved   PT LONG TERM GOAL #4   Title report ability to donn sweatshirt without pain or compensations (09/08/14)  Pain is now a 2-3/10   Time 6   Period Weeks   Status On-going               Plan - 08/25/14 0846    Clinical Impression Statement Pt reports not feelling well with a cold and  pt performed exercises for review.  Pt pain level improved with pulling shirts overhead  and pain improvement to 2-3.  Pt is 0/10 at rest and LTG achieved   PT Next Visit Plan Pt continue for scapular strengthening and upper back.  AROM for cervical and bilateral shoulders to progress as able   Consulted and Agree with Plan of Care Patient        Problem List Patient Active Problem List   Diagnosis Date Noted  . Obesity (BMI 30-39.9) 12/22/2013  . 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  . Hypertriglyceridemia 09/11/2013  . Depression 09/11/2013  . Insomnia 09/11/2013  . Generalized anxiety disorder 09/11/2013  . Dry mouth 09/11/2013  . Hyperglycemia 09/11/2013  . Hyperlipidemia LDL goal < 100 10/28/2012   . Tobacco use disorder 10/28/2012  . Essential hypertension, benign 10/28/2012  . 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 Obesity, unspecified 10/28/2012    Voncille Lo, PT 08/25/2014 8:49 AM Phone: [phone] Fax: [place], [place], [address] Phone: [phone]   Fax:  [phone]

## 2014-08-27 ENCOUNTER — Ambulatory Visit: Payer: Medicare Other | Admitting: Physical Therapy

## 2014-08-27 DIAGNOSIS — M25612 Stiffness of left shoulder, not elsewhere classified: Secondary | ICD-10-CM

## 2014-08-27 DIAGNOSIS — R293 Abnormal posture: Secondary | ICD-10-CM

## 2014-08-27 DIAGNOSIS — M25512 Pain in left shoulder: Secondary | ICD-10-CM | POA: Diagnosis not present

## 2014-08-27 DIAGNOSIS — M542 Cervicalgia: Secondary | ICD-10-CM | POA: Diagnosis not present

## 2014-08-27 NOTE — Therapy (Signed)
[place], [place], [address] Phone: [phone]   Fax:  [phone]  Physical Therapy Treatment  Patient Details  Name: David [name] MRN: [id number] Date of Birth: Nov 22, 1945 Referring Provider:  Gayland Curry, DO  Encounter Date: 08/27/2014      PT End of Session - 08/27/14 0850    Visit Number 10   Number of Visits 12   Date for PT Re-Evaluation 09/26/14   PT Start Time 0800   PT Stop Time 2297   PT Time Calculation (min) 55 min      Past Medical History  Diagnosis Date  . Atherosclerosis of native arteries of the extremities, unspecified   . Myalgia and myositis, unspecified   . Unspecified hypertrophic and atrophic condition of skin   . Hyperpotassemia   . Nocturia   . Rotator cuff (capsule) sprain   . Anxiety state, unspecified   . Contact dermatitis and other eczema due to other chemical products   . Insomnia, unspecified   . Herpes zoster with unspecified complication   . Lumbago   . Lumbago   . Pure hyperglyceridemia   . Other abnormal blood chemistry   . Obesity, unspecified   . Tobacco use disorder   . Other headache syndromes(339.89)   . Unspecified essential hypertension   . Asymptomatic varicose veins   . Hemorrhoids, external   . Allergic rhinitis due to pollen   . 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 Osteoarthrosis, unspecified whether generalized or localized, unspecified site   . Unspecified sleep apnea   . Rash and other nonspecific skin eruption   . Headache(784.0)   . Flatulence, eructation, and gas pain   . Routine general medical examination at a health care facility     Past Surgical History  Procedure Laterality Date  . Hemorrhoid surgery    . Varicose vein surgery      There were no vitals taken for this visit.  Visit Diagnosis:  Pain in neck  Shoulder stiffness, left  Pain in left shoulder  Abnormal  posture      Subjective Assessment - 08/27/14 0803    Symptoms I feel great.  I'm just about over my flu/cold.  Deep congestion.  Sleeping better once I get to sleep.  I'll be honest, I know how to do them but I forget to do the exercises.    Currently in Pain? Yes   Pain Score 2    Pain Location Neck   Pain Orientation Left   Pain Descriptors / Indicators Dull   Aggravating Factors  taking his shirt on/off is 1/4 as much as it was before   Pain Relieving Factors combination of it all                    [facility] Adult PT Treatment/Exercise - 08/27/14 0845    Neck Exercises: Theraband   Rows --  Review of home band ex   Neck Exercises: Prone   Shoulder Extension 10 reps   Moist Heat Therapy   Number Minutes Moist Heat 15 Minutes   Moist Heat Location --  Left neck/shoulder   Manual Therapy   Joint Mobilization C3-C7 PA, lateral glides right/left grade 3 15 sec each   Myofascial Release contract relax upper traps 5x 5 sec hold   Manual Traction 3 x 30 sec   Other Manual Therapy GH left distraction and inferior mobs with movement 10x  Trigger Point Dry Needling - 09/20/14 0848    Consent Given? Yes   Muscles Treated Upper Body Upper trapezius;Levator scapulae   Upper Trapezius Response Twitch reponse elicited;Palpable increased muscle length   Levator Scapulae Response Twitch response elicited;Palpable increased muscle length                   PT Long Term Goals - September 20, 2014 0856    PT LONG TERM GOAL #1   Title independent with HEP (09/08/14)   Time 6   Period Weeks   Status On-going   PT LONG TERM GOAL #2   Title Pt pain level 3/10 or less at rest   Status Achieved   PT LONG TERM GOAL #3   Title improve L shoulder flexion and abduction by 10 degrees each for improved function (09/08/14)   Status Achieved   PT LONG TERM GOAL #4   Title report ability to donn sweatshirt without pain or compensations (09/08/14)   Time 6   Period Weeks   Status  On-going               Plan - 2014-09-20 0912    Clinical Impression Statement Patient progressing with pain reduction and ROM improvements as shown last visit. 10th visit G code applied.   Patient in much improved spirits/mood today, smiling and joking.   Therapist closely monitoring pain response as well as respiratory status/ease and modifying treatment position accordingly.            G-Codes - September 20, 2014 0857    Functional Assessment Tool Used clinical judgement   Functional Limitation Carrying, moving and handling objects   Carrying, Moving and Handling Objects Current Status [id number]) At least 20 percent but less than 40 percent impaired, limited or restricted   Carrying, Moving and Handling Objects Goal Status (Z4827) At least 20 percent but less than 40 percent impaired, limited or restricted      Problem List Patient Active Problem List   Diagnosis Date Noted  . Obesity (BMI 30-39.9) 12/22/2013  . 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  . Hypertriglyceridemia 09/11/2013  . Depression 09/11/2013  . Insomnia 09/11/2013  . Generalized anxiety disorder 09/11/2013  . Dry mouth 09/11/2013  . Hyperglycemia 09/11/2013  . Hyperlipidemia LDL goal < 100 10/28/2012  . Tobacco use disorder 10/28/2012  . Essential hypertension, benign 10/28/2012  . 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 Obesity, unspecified 10/28/2012    Alvera Singh 09/20/14, 9:17 AM  [facility] [address] [place], [place], [address] Phone: [phone]   Fax:  [phone]

## 2014-09-01 ENCOUNTER — Ambulatory Visit: Payer: Medicare Other | Admitting: Physical Therapy

## 2014-09-01 DIAGNOSIS — M25612 Stiffness of left shoulder, not elsewhere classified: Secondary | ICD-10-CM | POA: Diagnosis not present

## 2014-09-01 DIAGNOSIS — M25512 Pain in left shoulder: Secondary | ICD-10-CM | POA: Diagnosis not present

## 2014-09-01 DIAGNOSIS — R293 Abnormal posture: Secondary | ICD-10-CM

## 2014-09-01 DIAGNOSIS — M542 Cervicalgia: Secondary | ICD-10-CM

## 2014-09-01 NOTE — Therapy (Signed)
[place], [place], [address] Phone: [phone]   Fax:  [phone]  Physical Therapy Treatment  Patient Details  Name: David [name] MRN: [id number] Date of Birth: 22-Apr-1946 Referring Provider:  Gayland Curry, DO  Encounter Date: 09/01/2014      PT End of Session - 09/01/14 1234    Visit Number 11   Number of Visits 12   Date for PT Re-Evaluation 09/26/14   PT Start Time 9323   PT Stop Time 1240   PT Time Calculation (min) 55 min   Activity Tolerance Patient tolerated treatment well      Past Medical History  Diagnosis Date  . Atherosclerosis of native arteries of the extremities, unspecified   . Myalgia and myositis, unspecified   . Unspecified hypertrophic and atrophic condition of skin   . Hyperpotassemia   . Nocturia   . Rotator cuff (capsule) sprain   . Anxiety state, unspecified   . Contact dermatitis and other eczema due to other chemical products   . Insomnia, unspecified   . Herpes zoster with unspecified complication   . Lumbago   . Lumbago   . Pure hyperglyceridemia   . Other abnormal blood chemistry   . Obesity, unspecified   . Tobacco use disorder   . Other headache syndromes(339.89)   . Unspecified essential hypertension   . Asymptomatic varicose veins   . Hemorrhoids, external   . Allergic rhinitis due to pollen   . 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 Osteoarthrosis, unspecified whether generalized or localized, unspecified site   . Unspecified sleep apnea   . Rash and other nonspecific skin eruption   . Headache(784.0)   . Flatulence, eructation, and gas pain   . Routine general medical examination at a health care facility     Past Surgical History  Procedure Laterality Date  . Hemorrhoid surgery    . Varicose vein surgery      There were no vitals taken for this visit.  Visit Diagnosis:  Pain in neck  Shoulder  stiffness, left  Pain in left shoulder  Abnormal posture      Subjective Assessment - 09/01/14 1152    Symptoms This cold is still lingering.  Still congested.   Some stiffness and soreness.  More pain with rolling shoulder forward or back it pops.  I have the makings of a small headache, suboccipitals   Currently in Pain? Yes   Pain Score 2    Pain Location Neck   Pain Orientation Left   Aggravating Factors  less pain with taking shirt on/off   Pain Relieving Factors combination of it all                    [facility] Adult PT Treatment/Exercise - 09/01/14 1231    Moist Heat Therapy   Number Minutes Moist Heat 12 Minutes   Moist Heat Location --  left neck, shoulder, shoulder blade seated   Manual Therapy   Joint Mobilization rotational mobs C2-3 grade 3 15 sec   Myofascial Release upper traps,levator, suboccipital release 3 min   Scapular Mobilization left distraction, med/lateral, superior/inferior   Manual Traction 3x 30 sec   Other Manual Therapy Left GH distraction, inferior and posterior  left GH inferior mob with movement 10x          Trigger Point Dry Needling - 09/01/14 1230    Muscles Treated Upper Body Upper trapezius;Levator scapulae;Rhomboids;Subscapularis;Oblique  capitus;Suboccipitals muscle group   Upper Trapezius Response Twitch reponse elicited   Oblique Capitus Response Palpable increased muscle length   SubOccipitals Response Palpable increased muscle length   Levator Scapulae Response Twitch response elicited   Subscapularis Response Palpable increased muscle length      Left side only.             PT Long Term Goals - 09/01/14 1258    PT LONG TERM GOAL #1   Title independent with HEP (09/08/14)   Time 6   Period Weeks   Status On-going   PT LONG TERM GOAL #2   Title Pt pain level 3/10 or less at rest   Status Achieved   PT LONG TERM GOAL #3   Title improve L shoulder flexion and abduction by 10 degrees each for improved  function (09/08/14)   Status Achieved   PT LONG TERM GOAL #4   Title report ability to donn sweatshirt without pain or compensations (09/08/14)   Time 6   Period Weeks   Status On-going               Plan - 09/01/14 1235    Clinical Impression Statement Patient should meet remaining goals in 1-2 more weeks.  Positioning of treatment modified secondary to congestion with lying down.  Patient reports his mild headache resolves during treatment session.  Overall decreased spasm/trigger point size and number.  Patient admits he has not been doing his home exercises for various reasons.     PT Next Visit Plan Manual techniques including dry needling;   scapular strengthening and upper back.  AROM for cervical and bilateral shoulders to progress as able; recheck neck and shoulder AROM        Problem List Patient Active Problem List   Diagnosis Date Noted  . Obesity (BMI 30-39.9) 12/22/2013  . 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  . Hypertriglyceridemia 09/11/2013  . Depression 09/11/2013  . Insomnia 09/11/2013  . Generalized anxiety disorder 09/11/2013  . Dry mouth 09/11/2013  . Hyperglycemia 09/11/2013  . Hyperlipidemia LDL goal < 100 10/28/2012  . Tobacco use disorder 10/28/2012  . Essential hypertension, benign 10/28/2012  . 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 Obesity, unspecified 10/28/2012    Alvera Singh 09/01/2014, 1:01 PM  [facility] [address] [place], [place], [address] Phone: [phone]   Fax:  [phone]  Ruben Im, PT 09/01/2014 1:02 PM Phone: [phone] Fax: [phone]

## 2014-09-03 ENCOUNTER — Ambulatory Visit: Payer: Medicare Other | Admitting: Physical Therapy

## 2014-09-03 DIAGNOSIS — M25512 Pain in left shoulder: Secondary | ICD-10-CM | POA: Diagnosis not present

## 2014-09-03 DIAGNOSIS — M25612 Stiffness of left shoulder, not elsewhere classified: Secondary | ICD-10-CM | POA: Diagnosis not present

## 2014-09-03 DIAGNOSIS — M542 Cervicalgia: Secondary | ICD-10-CM | POA: Diagnosis not present

## 2014-09-03 DIAGNOSIS — R293 Abnormal posture: Secondary | ICD-10-CM

## 2014-09-03 NOTE — Patient Instructions (Addendum)
Sleeping on Back  Place pillow under knees. A pillow with cervical support and a roll around waist are also helpful. Copyright  VHI. All rights reserved.  Sleeping on Side Place pillow between knees. Use cervical support under neck and a roll around waist as needed. Copyright  VHI. All rights reserved.   Sleeping on Stomach   If this is the only desirable sleeping position, place pillow under lower legs, and under stomach or chest as needed.  Posture - Sitting   Sit upright, head facing forward. Try using a roll to support lower back. Keep shoulders relaxed, and avoid rounded back. Keep hips level with knees. Avoid crossing legs for long periods. Stand to Sit / Sit to Stand   To sit: Bend knees to lower self onto front edge of chair, then scoot back on seat. To stand: Reverse sequence by placing one foot forward, and scoot to front of seat. Use rocking motion to stand up.   Work Height and Reach  Ideal work height is no more than 2 to 4 inches below elbow level when standing, and at elbow level when sitting. Reaching should be limited to arm's length, with elbows slightly bent.  Bending  Bend at hips and knees, not back. Keep feet shoulder-width apart.    Posture - Standing   Good posture is important. Avoid slouching and forward head thrust. Maintain curve in low back and align ears over shoul- ders, hips over ankles.  Alternating Positions   Alternate tasks and change positions frequently to reduce fatigue and muscle tension. Take rest breaks. Computer Work   Position work to face forward. Use proper work and seat height. Keep shoulders back and down, wrists straight, and elbows at right angles. Use chair that provides full back support. Add footrest and lumbar roll as needed.  Getting Into / Out of Car  Lower self onto seat, scoot back, then bring in one leg at a time. Reverse sequence to get out.  Dressing  Lie on back to pull socks or slacks over feet, or sit  and bend leg while keeping back straight.    Housework - Sink  Place one foot on ledge of cabinet under sink when standing at sink for prolonged periods.   Pushing / Pulling  Pushing is preferable to pulling. Keep back in proper alignment, and use leg muscles to do the work.  Deep Squat   Squat and lift with both arms held against upper trunk. Tighten stomach muscles without holding breath. Use smooth movements to avoid jerking.  Avoid Twisting   Avoid twisting or bending back. Pivot around using foot movements, and bend at knees if needed when reaching for articles.  Carrying Luggage   Distribute weight evenly on both sides. Use a cart whenever possible. Do not twist trunk. Move body as a unit.   Lifting Principles .Maintain proper posture and head alignment. .Slide object as close as possible before lifting. .Move obstacles out of the way. .Test before lifting; ask for help if too heavy. .Tighten stomach muscles without holding breath. .Use smooth movements; do not jerk. .Use legs to do the work, and pivot with feet. .Distribute the work load symmetrically and close to the center of trunk. .Push instead of pull whenever possible.   Ask For Help   Ask for help and delegate to others when possible. Coordinate your movements when lifting together, and maintain the low back curve.  Log Roll   Lying on back, bend left knee and place left   arm across chest. Roll all in one movement to the right. Reverse to roll to the left. Always move as one unit. Housework - Sweeping  Use long-handled equipment to avoid stooping.   Housework - Wiping  Position yourself as close as possible to reach work surface. Avoid straining your back.  Laundry - Unloading Wash   To unload small items at bottom of washer, lift leg opposite to arm being used to reach.  Neffs close to area to be raked. Use arm movements to do the work. Keep back straight and avoid  twisting.  Voncille Lo, PT 09/03/2014 9:06 AM Phone: [phone] Fax: [phone]    Cart  When reaching into cart with one arm, lift opposite leg to keep back straight.   Getting Into / Out of Bed  Lower self to lie down on one side by raising legs and lowering head at the same time. Use arms to assist moving without twisting. Bend both knees to roll onto back if desired. To sit up, start from lying on side, and use same move-ments in reverse. Housework - Vacuuming  Hold the vacuum with arm held at side. Step back and forth to move it, keeping head up. Avoid twisting.   Laundry - IT consultant so that bending and twisting can be avoided.   Laundry - Unloading Dryer  Squat down to reach into clothes dryer or use a reacher.  Gardening - Weeding / Probation officer or Kneel. Knee pads may be helpful.                  Trigger Point Dry Needling  . What is Trigger Point Dry Needling (DN)? o DN is a physical therapy technique used to treat muscle pain and dysfunction. Specifically, DN helps deactivate muscle trigger points (muscle knots).  o A thin filiform needle is used to penetrate the skin and stimulate the underlying trigger point. The goal is for a local twitch response (LTR) to occur and for the trigger point to relax. No medication of any kind is injected during the procedure.   . What Does Trigger Point Dry Needling Feel Like?  o The procedure feels different for each individual patient. Some patients report that they do not actually feel the needle enter the skin and overall the process is not painful. Very mild bleeding may occur. However, many patients feel a deep cramping in the muscle in which the needle was inserted. This is the local twitch response.   Marland Kitchen How Will I feel after the treatment? o Soreness is normal, and the onset of soreness may not occur for a few hours. Typically this soreness does not last longer than two  days.  o Bruising is uncommon, however; ice can be used to decrease any possible bruising.  o In rare cases feeling tired or nauseous after the treatment is normal. In addition, your symptoms may get worse before they get better, this period will typically not last longer than 24 hours.   . What Can I do After My Treatment? o Increase your hydration by drinking more water for the next 24 hours. o You may place ice or heat on the areas treated that have become sore, however, do not use heat on inflamed or bruised areas. Heat often brings more relief post needling. o You can continue your regular activities, but vigorous activity is not recommended initially after the treatment for 24 hours. o DN is best combined with  other physical therapy such as strengthening, stretching, and other therapies.

## 2014-09-03 NOTE — Therapy (Addendum)
[place], [place], [address] Phone: [phone]   Fax:  [phone]  Physical Therapy Treatment  Patient Details  Name: David [name] MRN: [id number] Date of Birth: [birth year] Referring Provider:  Gayland Curry, DO  Encounter Date: 09/03/2014      PT End of Session - 09/03/14 1537    Visit Number 12   Number of Visits 12   Date for PT Re-Evaluation 09/26/14   PT Start Time 0850   PT Stop Time 0948   PT Time Calculation (min) 58 min   Activity Tolerance Patient tolerated treatment well   Behavior During Therapy [facility] for tasks assessed/performed      Past Medical History  Diagnosis Date  . Atherosclerosis of native arteries of the extremities, unspecified   . Myalgia and myositis, unspecified   . Unspecified hypertrophic and atrophic condition of skin   . Hyperpotassemia   . Nocturia   . Rotator cuff (capsule) sprain   . Anxiety state, unspecified   . Contact dermatitis and other eczema due to other chemical products   . Insomnia, unspecified   . Herpes zoster with unspecified complication   . Lumbago   . Lumbago   . Pure hyperglyceridemia   . Other abnormal blood chemistry   . Obesity, unspecified   . Tobacco use disorder   . Other headache syndromes(339.89)   . Unspecified essential hypertension   . Asymptomatic varicose veins   . Hemorrhoids, external   . Allergic rhinitis due to pollen   . 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 Osteoarthrosis, unspecified whether generalized or localized, unspecified site   . Unspecified sleep apnea   . Rash and other nonspecific skin eruption   . Headache(784.0)   . Flatulence, eructation, and gas pain   . Routine general medical examination at a health care facility     Past Surgical History  Procedure Laterality Date  . Hemorrhoid surgery    . Varicose vein surgery      There were no vitals taken for this  visit.  Visit Diagnosis:  Pain in neck  Shoulder stiffness, left  Pain in left shoulder  Abnormal posture      Subjective Assessment - 09/03/14 0853    Symptoms Feels better from cold the other day.  I have really improved.  I still have pain 1/10 when I reach up high over my head on my Left shoulder but I am so much better   Pain Score 1    Pain Location Neck   Pain Orientation Left   Pain Descriptors / Indicators Dull   Pain Type Chronic pain   Pain Onset More than a month ago          [facility] PT Assessment - 09/03/14 0909    Observation/Other Assessments   Focus on Therapeutic Outcomes (FOTO)  FOTO improved from 66 to 83%  limitation 17%( iimproved from 34% to 17% limiatation   AROM   Left Shoulder Flexion 158 Degrees   Left Shoulder ABduction 150 Degrees   Left Shoulder Internal Rotation 55 Degrees   Left Shoulder External Rotation 89 Degrees   Cervical Flexion 60   Cervical Extension 50   Strength   Left Shoulder Flexion 4+/5   Left Shoulder Extension 4+/5   Left Shoulder ABduction 4+/5   Left Shoulder Internal Rotation --  4+/5   Left Shoulder External Rotation 4/5  [facility] Adult PT Treatment/Exercise - 09/03/14 0909    Posture/Postural Control   Posture/Postural Control Postural limitations   Postural Limitations Rounded Shoulders;Forward head;Increased thoracic kyphosis;Flexed trunk   Posture Comments Pt instructed on proper sleeping sitting and standing posture with ADL's and given handout   Neck Exercises: Theraband   Other Theraband Exercises Reviewed rockwood exercises and answered questions about HEP.  Pt demonstrates independence with HEP   Shoulder Exercises: Standing   Horizontal ABduction 5 reps;Theraband;Left   Theraband Level (Shoulder Horizontal ABduction) Level 2 (Red)   External Rotation 5 reps;Theraband;Left   Theraband Level (Shoulder External Rotation) Level 2 (Red)   Internal Rotation 5 reps;Theraband;Left   Moist  Heat Therapy   Number Minutes Moist Heat 15 Minutes   Moist Heat Location --  upper back and shoulder   Manual Therapy   Manual Therapy Joint mobilization;Myofascial release   Joint Mobilization C3 to C7   Myofascial Release upper trap levator and rhomboid and levator on left          Trigger Point Dry Needling - 09/03/14 0910    Consent Given? Yes   Education Handout Provided Yes  previously given   Muscles Treated Upper Body Upper trapezius;Levator scapulae;Rhomboids;Supraspinatus   Upper Trapezius Response Twitch reponse elicited;Palpable increased muscle length  all DN for Left side only   SubOccipitals Response --  Pt with no tenderness so no dry needling done here   Levator Scapulae Response Twitch response elicited;Palpable increased muscle length   Rhomboids Response Palpable increased muscle length  left side only   Supraspinatus Response Twitch response elicited;Palpable increased muscle length              PT Education - 09/03/14 1536    Education Details Pt reviewed posture and body mechanices and HEP for exercise   Methods Explanation;Demonstration;Verbal cues   Comprehension Verbalized understanding;Returned demonstration             PT Long Term Goals - 09/03/14 6010    PT LONG TERM GOAL #1   Title independent with HEP (09/08/14)   Time 6   Period Weeks   Status Achieved   PT LONG TERM GOAL #2   Title Pt pain level 3/10 or less at rest   Time 6   Period Weeks   Status Achieved   PT LONG TERM GOAL #3   Title improve L shoulder flexion and abduction by 10 degrees each for improved function (09/08/14)   Time 6   Period Weeks   Status Achieved   PT LONG TERM GOAL #4   Title report ability to donn sweatshirt without pain or compensations (09/08/14)   Time 6   Period Weeks   Status Achieved               Plan - 09/03/14 1538    Clinical Impression Statement Pt achieved all goals set for treament period with pain level at 1/10 and 0/10  after treatment.  Pt with improved AROM in neck and shoulder.  End of Range pain with maximal flexion on Left possibly caused by a bone spur.  Pt stated she some times has back pain and he was advised to address with his MD for work up if his back pain worsens.  Pt stated he has been very pleased with he physical therapy.    PT Next Visit Plan Discharge due to achievement of goals and FOTO reduced form 34% limitation to 17 % limitation  G-Codes - 09/03/14 1554    Carrying, Moving and Handling Objects Discharge Status [phone]) At least 1 percent but less than 20 percent impaired, limited or restricted   Carrying, Moving and Handling Objects Goal Status ([id number]) At least 20 percent but less than 40 percent impaired, limited or restricted      Problem List Patient Active Problem List   Diagnosis Date Noted  . Obesity (BMI 30-39.9) 12/22/2013  . 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  . Hypertriglyceridemia 09/11/2013  . Depression 09/11/2013  . Insomnia 09/11/2013  . Generalized anxiety disorder 09/11/2013  . Dry mouth 09/11/2013  . Hyperglycemia 09/11/2013  . Hyperlipidemia LDL goal < 100 10/28/2012  . Tobacco use disorder 10/28/2012  . Essential hypertension, benign 10/28/2012  . 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 Obesity, unspecified 10/28/2012    Voncille Lo, PT 09/03/2014 3:55 PM Phone: [phone] Fax: [facility] [address] [place], [place], [address] Phone: [phone]   Fax:  [phone]    PHYSICAL THERAPY DISCHARGE SUMMARY  Visits from Start of Care: 12  Current functional level related to goals / functional outcomes: See goals above   Remaining deficits: Left arm end range of motion painful 1/10      Education / Equipment: HEP and Theraband/ Posture and body mechanics Plan: Patient agrees to discharge.  Patient goals were partially met. Patient is  being discharged due to meeting the stated rehab goals.  ????? and being pleased with current functional level

## 2014-09-08 ENCOUNTER — Encounter: Payer: Medicare Other | Admitting: Physical Therapy

## 2014-09-09 DIAGNOSIS — H2513 Age-related nuclear cataract, bilateral: Secondary | ICD-10-CM | POA: Diagnosis not present

## 2014-09-10 ENCOUNTER — Other Ambulatory Visit: Payer: Self-pay | Admitting: Internal Medicine

## 2014-09-10 ENCOUNTER — Encounter: Payer: Medicare Other | Admitting: Physical Therapy

## 2014-10-13 ENCOUNTER — Other Ambulatory Visit: Payer: Medicare Other

## 2014-10-13 DIAGNOSIS — E782 Mixed hyperlipidemia: Secondary | ICD-10-CM | POA: Diagnosis not present

## 2014-10-13 DIAGNOSIS — R7309 Other abnormal glucose: Secondary | ICD-10-CM | POA: Diagnosis not present

## 2014-10-13 DIAGNOSIS — R739 Hyperglycemia, unspecified: Secondary | ICD-10-CM

## 2014-10-14 LAB — CBC WITH DIFFERENTIAL
Basophils Absolute: 0.1 10*3/uL (ref 0.0–0.2)
Basos: 1 %
Eos: 3 %
Eosinophils Absolute: 0.2 10*3/uL (ref 0.0–0.4)
HCT: 49.1 % (ref 37.5–51.0)
Hemoglobin: 16.2 g/dL (ref 12.6–17.7)
Immature Grans (Abs): 0 10*3/uL (ref 0.0–0.1)
Immature Granulocytes: 0 %
Lymphocytes Absolute: 2.4 10*3/uL (ref 0.7–3.1)
Lymphs: 32 %
MCH: 31 pg (ref 26.6–33.0)
MCHC: 33 g/dL (ref 31.5–35.7)
MCV: 94 fL (ref 79–97)
Monocytes Absolute: 0.8 10*3/uL (ref 0.1–0.9)
Monocytes: 11 %
Neutrophils Absolute: 4 10*3/uL (ref 1.4–7.0)
Neutrophils Relative %: 53 %
RBC: 5.23 x10E6/uL (ref 4.14–5.80)
RDW: 13.8 % (ref 12.3–15.4)
WBC: 7.5 10*3/uL (ref 3.4–10.8)

## 2014-10-14 LAB — HEMOGLOBIN A1C
Est. average glucose Bld gHb Est-mCnc: 126 mg/dL
Hgb A1c MFr Bld: 6 % — ABNORMAL HIGH (ref 4.8–5.6)

## 2014-10-14 LAB — LIPID PANEL
Chol/HDL Ratio: 3.8 ratio units (ref 0.0–5.0)
Cholesterol, Total: 126 mg/dL (ref 100–199)
HDL: 33 mg/dL — ABNORMAL LOW (ref >39)
LDL Calculated: 61 mg/dL (ref 0–99)
Triglycerides: 161 mg/dL — ABNORMAL HIGH (ref 0–149)
VLDL Cholesterol Cal: 32 mg/dL (ref 5–40)

## 2014-10-15 ENCOUNTER — Encounter: Payer: Self-pay | Admitting: Internal Medicine

## 2014-10-15 ENCOUNTER — Ambulatory Visit (INDEPENDENT_AMBULATORY_CARE_PROVIDER_SITE_OTHER): Payer: Medicare Other | Admitting: Internal Medicine

## 2014-10-15 VITALS — BP 148/86 | HR 94 | Temp 98.0°F | Resp 20 | Ht 70.0 in | Wt 223.2 lb

## 2014-10-15 DIAGNOSIS — M542 Cervicalgia: Secondary | ICD-10-CM

## 2014-10-15 DIAGNOSIS — F17200 Nicotine dependence, unspecified, uncomplicated: Secondary | ICD-10-CM

## 2014-10-15 DIAGNOSIS — I10 Essential (primary) hypertension: Secondary | ICD-10-CM

## 2014-10-15 DIAGNOSIS — E782 Mixed hyperlipidemia: Secondary | ICD-10-CM

## 2014-10-15 DIAGNOSIS — J302 Other seasonal allergic rhinitis: Secondary | ICD-10-CM | POA: Diagnosis not present

## 2014-10-15 DIAGNOSIS — F329 Major depressive disorder, single episode, unspecified: Secondary | ICD-10-CM

## 2014-10-15 DIAGNOSIS — G8929 Other chronic pain: Secondary | ICD-10-CM | POA: Diagnosis not present

## 2014-10-15 DIAGNOSIS — F32A Depression, unspecified: Secondary | ICD-10-CM

## 2014-10-15 DIAGNOSIS — Z720 Tobacco use: Secondary | ICD-10-CM

## 2014-10-15 MED ORDER — OLOPATADINE HCL 0.1 % OP SOLN: 1.0000 [drp] | Freq: Two times a day (BID) | OPHTHALMIC | Status: DC

## 2014-10-15 MED ORDER — SIMVASTATIN 20 MG PO TABS: ORAL_TABLET | ORAL | Status: DC

## 2014-10-15 MED ORDER — LISINOPRIL 5 MG PO TABS: 5.0000 mg | ORAL_TABLET | Freq: Every day | ORAL | Status: DC

## 2014-10-15 NOTE — Patient Instructions (Signed)
Start lisinopril 5mg  daily.   Avoid smoking just before your follow up on your blood pressure.   Try to continue to reduce your smoking.   Also avoid high sodium foods.    DASH Eating Plan DASH stands for "Dietary Approaches to Stop Hypertension." The DASH eating plan is a healthy eating plan that has been shown to reduce high blood pressure (hypertension). Additional health benefits may include reducing the risk of type 2 diabetes mellitus, heart disease, and stroke. The DASH eating plan may also help with weight loss. WHAT DO I NEED TO KNOW ABOUT THE DASH EATING PLAN? For the DASH eating plan, you will follow these general guidelines:  Choose foods with a percent daily value for sodium of less than 5% (as listed on the food label).  Use salt-free seasonings or herbs instead of table salt or sea salt.  Check with your health care provider or pharmacist before using salt substitutes.  Eat lower-sodium products, often labeled as "lower sodium" or "no salt added."  Eat fresh foods.  Eat more vegetables, fruits, and low-fat dairy products.  Choose whole grains. Look for the word "whole" as the first word in the ingredient list.  Choose fish and skinless chicken or [place] more often than red meat. Limit fish, poultry, and meat to 6 oz (170 g) each day.  Limit sweets, desserts, sugars, and sugary drinks.  Choose heart-healthy fats.  Limit cheese to 1 oz (28 g) per day.  Eat more home-cooked food and less restaurant, buffet, and fast food.  Limit fried foods.  Cook foods using methods other than frying.  Limit canned vegetables. If you do use them, rinse them well to decrease the sodium.  When eating at a restaurant, ask that your food be prepared with less salt, or no salt if possible. WHAT FOODS CAN I EAT? Seek help from a dietitian for individual calorie needs. Grains Whole grain or whole wheat bread. Brown rice. Whole grain or whole wheat pasta. Quinoa, bulgur, and whole  grain cereals. Low-sodium cereals. Corn or whole wheat flour tortillas. Whole grain cornbread. Whole grain crackers. Low-sodium crackers. Vegetables Fresh or frozen vegetables (raw, steamed, roasted, or grilled). Low-sodium or reduced-sodium tomato and vegetable juices. Low-sodium or reduced-sodium tomato sauce and paste. Low-sodium or reduced-sodium canned vegetables.  Fruits All fresh, canned (in natural juice), or frozen fruits. Meat and Other Protein Products Ground beef (85% or leaner), grass-fed beef, or beef trimmed of fat. Skinless chicken or [place]. Ground chicken or [place]. Pork trimmed of fat. All fish and seafood. Eggs. Dried beans, peas, or lentils. Unsalted nuts and seeds. Unsalted canned beans. Dairy Low-fat dairy products, such as skim or 1% milk, 2% or reduced-fat cheeses, low-fat ricotta or cottage cheese, or plain low-fat yogurt. Low-sodium or reduced-sodium cheeses. Fats and Oils Tub margarines without trans fats. Light or reduced-fat mayonnaise and salad dressings (reduced sodium). Avocado. Safflower, olive, or canola oils. Natural peanut or almond butter. Other Unsalted popcorn and pretzels. The items listed above may not be a complete list of recommended foods or beverages. Contact your dietitian for more options. WHAT FOODS ARE NOT RECOMMENDED? Grains White bread. White pasta. White rice. Refined cornbread. Bagels and croissants. Crackers that contain trans fat. Vegetables Creamed or fried vegetables. Vegetables in a cheese sauce. Regular canned vegetables. Regular canned tomato sauce and paste. Regular tomato and vegetable juices. Fruits Dried fruits. Canned fruit in light or heavy syrup. Fruit juice. Meat and Other Protein Products Fatty cuts of meat. Ribs, chicken wings,  bacon, sausage, bologna, salami, chitterlings, fatback, hot dogs, bratwurst, and packaged luncheon meats. Salted nuts and seeds. Canned beans with salt. Dairy Whole or 2% milk, cream, half-and-half,  and cream cheese. Whole-fat or sweetened yogurt. Full-fat cheeses or blue cheese. Nondairy creamers and whipped toppings. Processed cheese, cheese spreads, or cheese curds. Condiments Onion and garlic salt, seasoned salt, table salt, and sea salt. Canned and packaged gravies. Worcestershire sauce. Tartar sauce. Barbecue sauce. Teriyaki sauce. Soy sauce, including reduced sodium. Steak sauce. Fish sauce. Oyster sauce. Cocktail sauce. Horseradish. Ketchup and mustard. Meat flavorings and tenderizers. Bouillon cubes. Hot sauce. Tabasco sauce. Marinades. Taco seasonings. Relishes. Fats and Oils Butter, stick margarine, lard, shortening, ghee, and bacon fat. Coconut, palm kernel, or palm oils. Regular salad dressings. Other Pickles and olives. Salted popcorn and pretzels. The items listed above may not be a complete list of foods and beverages to avoid. Contact your dietitian for more information. WHERE CAN I FIND MORE INFORMATION? National Heart, Lung, and [employer] Institute: travelstabloid.com Document Released: 06/15/2011 Document Revised: 11/10/2013 Document Reviewed: 04/30/2013 [facility] Patient Information 2015 [place], [place]. This information is not intended to replace advice given to you by your health care provider. Make sure you discuss any questions you have with your health care provider.

## 2014-10-15 NOTE — Progress Notes (Signed)
Patient ID: David [name], male   DOB: 1946-01-26, 69 y.o.   MRN: [id number]   Location:  [facility] / [facility]  Code Status: full code Goals of Care: Advanced Directive information Does patient have an advance directive?: No, Would patient like information on creating an advanced directive?: Yes - Educational materials given (previous visit but he has not yet done advance directive)   Allergies  Allergen Reactions  . Celebrex [Celecoxib]   . Erythromycin   . Oxycontin [Oxycodone Hcl]   . Penicillins   . Tetracyclines & Related     Chief Complaint  Patient presents with  . Medical Management of Chronic Issues    3 Month follow-up, Discuss labs (copy printed)    HPI: Patient is a 69 y.o.white male seen in the office today for med mgt of chronic diseases.    Eyes are burning and nose running.  Has been taking loratadine and did take zyrtec type that Dr. Fredderick Phenix gave him before that he used up.  Still has not changed his diet.  Eating a lot of taters.  TG still slightly above goal and hba1c decreased by 0.1.    Has been dealing with his dad's estate.  He does have two sisters.    Still smoking.  Is smoking less than he was.  Had been up to 1.5ppd and down to 1ppd again.    Neck and shoulder blade doing a lot better.  Left shoulder area still painful with bone spur.  Occasional irritation in low back and knee.     Still picking back up from loss of his dad, but otherwise doing ok.  About two weeks afterwards, grief was making him very anxious and could have used something, but did not contact us.    BP above goal today--did smoke before coming in here.    Review of Systems:  Review of Systems  Constitutional: Positive for malaise/fatigue. Negative for fever and chills.  HENT: Negative for congestion.   Eyes: Positive for blurred vision and redness.       Dry, burning eyes  Respiratory: Negative for shortness of breath.   Cardiovascular:  Negative for chest pain and palpitations.  Gastrointestinal: Negative for abdominal pain.  Genitourinary: Negative for dysuria, urgency and frequency.  Musculoskeletal: Positive for myalgias, joint pain and neck pain. Negative for falls.  Neurological: Positive for weakness. Negative for dizziness.  Psychiatric/Behavioral: Positive for depression. Negative for memory loss.     Past Medical History  Diagnosis Date  . Atherosclerosis of native arteries of the extremities, unspecified   . Myalgia and myositis, unspecified   . Unspecified hypertrophic and atrophic condition of skin   . Hyperpotassemia   . Nocturia   . Rotator cuff (capsule) sprain   . Anxiety state, unspecified   . Contact dermatitis and other eczema due to other chemical products   . Insomnia, unspecified   . Herpes zoster with unspecified complication   . Lumbago   . Lumbago   . Pure hyperglyceridemia   . Other abnormal blood chemistry   . Obesity, unspecified   . Tobacco use disorder   . Other headache syndromes(339.89)   . Unspecified essential hypertension   . Asymptomatic varicose veins   . Hemorrhoids, external   . Allergic rhinitis due to pollen   . 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 Osteoarthrosis, unspecified whether generalized or localized, unspecified site   . Unspecified sleep apnea   . Rash  and other nonspecific skin eruption   . Headache(784.0)   . Flatulence, eructation, and gas pain   . Routine general medical examination at a health care facility     Past Surgical History  Procedure Laterality Date  . Hemorrhoid surgery    . Varicose vein surgery      Social History:   reports that he has been smoking Cigarettes.  He has a 55 pack-year smoking history. He does not have any smokeless tobacco history on file. He reports that he does not drink alcohol or use illicit drugs.  Family History  Problem Relation Age of Onset  . Diabetes Mother   . Heart  disease Mother   . Stroke Mother   . Stroke Father     Medications: Patient's Medications  New Prescriptions   No medications on file  Previous Medications   LORATADINE (CLARITIN) 10 MG TABLET    Take 10 mg by mouth daily.    POLYETHYL GLYCOL-PROPYL GLYCOL (SYSTANE) 0.4-0.3 % SOLN    Place 1-2 drops into both eyes as needed.   SIMVASTATIN (ZOCOR) 20 MG TABLET    TAKE 1 TABLET BY MOUTH EVERY DAY AT 6PM   VENLAFAXINE XR (EFFEXOR-XR) 37.5 MG 24 HR CAPSULE    TAKE ONE CAPSULE BY MOUTH EVERY DAY WITH BREAKFAST  Modified Medications   No medications on file  Discontinued Medications   METAXALONE (SKELAXIN) 800 MG TABLET    Take 1 tablet (800 mg total) by mouth 3 (three) times daily.   TDAP (BOOSTRIX) 5-2.5-18.5 LF-MCG/0.5 INJECTION    Inject 0.5 mLs into the muscle once.     Physical Exam: Filed Vitals:   10/15/14 0820  BP: 148/86  Pulse: 94  Temp: 98 F (36.7 C)  TempSrc: Oral  Resp: 20  Height: 5\' 10"  (1.778 m)  Weight: 223 lb 3.2 oz (101.243 kg)  SpO2: 97%  Physical Exam  Constitutional: He is oriented to person, place, and time. He appears well-developed and well-nourished. No distress.  Body odor  Eyes:  Watery eyes, nasal congestion  Cardiovascular: Normal rate, regular rhythm, normal heart sounds and intact distal pulses.   Pulmonary/Chest: Effort normal and breath sounds normal.  Musculoskeletal: Normal range of motion. He exhibits tenderness.  Left shoulder  Neurological: He is alert and oriented to person, place, and time.  Skin: Skin is warm and dry.  Psychiatric: He has a normal mood and affect.    Labs reviewed: Basic Metabolic Panel:  Recent Labs  12/09/13 0838 04/08/14 0929 07/13/14 0826  NA 140 139 137  K 4.2 4.4 4.3  CL 105 100 102  CO2 22 22 23   GLUCOSE 130* 99 117*  BUN 13 13 11   CREATININE 0.67* 0.77 0.73*  CALCIUM 8.7 9.4 9.4   Liver Function Tests:  Recent Labs  12/09/13 0838 04/08/14 0929  AST 19 19  ALT 17 13  ALKPHOS 66 78    BILITOT 0.4 0.4  PROT 6.4 6.7   No results for input(s): LIPASE, AMYLASE in the last 8760 hours. No results for input(s): AMMONIA in the last 8760 hours. CBC:  Recent Labs  12/09/13 0838 04/08/14 0929 10/13/14 0817  WBC 6.2 8.5 7.5  NEUTROABS 3.4 4.8 4.0  HGB 15.7 16.0 16.2  HCT 47.2 47.3 49.1  MCV 93 95 94  PLT  --  241  --    Lipid Panel:  Recent Labs  04/08/14 0929 07/13/14 0826 10/13/14 0817  CHOL 112 114 126  HDL 30* 27* 33*  LDLCALC 53 57 61  TRIG 144 150* 161*  CHOLHDL 3.7 4.2 3.8   Lab Results  Component Value Date   HGBA1C 6.0* 10/13/2014   Assessment/Plan 1. Seasonal allergies - keep f/u with Dr. Fredderick Phenix - olopatadine (PATANOL) 0.1 % ophthalmic solution; Place 1 drop into both eyes 2 (two) times daily.  Dispense: 5 mL; Refill: 0  2. Mixed hyperlipidemia - cholesterol improved, cont same zocor, cont to cut back on taters - simvastatin (ZOCOR) 20 MG tablet; TAKE 1 TABLET BY MOUTH EVERY DAY AT 6PM  Dispense: 90 tablet; Refill: 3  3. Chronic neck pain -improved after dry needling procedure, but has some residual in left shoulder area  4. Tobacco use disorder -cont to cut back on cigarettes; has been unable to quit  5. Depression -cont effexor which can affect bp, but seems his high sodium intake and smoking are more likely responsible  6. Essential hypertension, benign -recheck was higher after relaxing in room--158/92 -also reviewed records from his times he's given blood and several of these were also elevated -start lisinopril -cut back on salt in diet -DASH diet provided -f/u in 2 wks for bp check only - lisinopril (PRINIVIL,ZESTRIL) 5 MG tablet; Take 1 tablet (5 mg total) by mouth daily.  Dispense: 90 tablet; Refill: 3  Labs/tests ordered:  No new today Next appt:  2 wks for bp check  Aaryana Betke L. Beatrice Sehgal, D.O. [facility] [address],  [address] Cell Phone (Mon-Fri 8am-5pm):   [phone] On Call:  [phone] & follow prompts after 5pm & weekends Office Phone:  [phone] Office Fax:  [phone]

## 2014-10-23 DIAGNOSIS — J309 Allergic rhinitis, unspecified: Secondary | ICD-10-CM | POA: Diagnosis not present

## 2014-10-23 DIAGNOSIS — H1045 Other chronic allergic conjunctivitis: Secondary | ICD-10-CM | POA: Diagnosis not present

## 2014-10-29 ENCOUNTER — Encounter: Payer: Self-pay | Admitting: Internal Medicine

## 2014-10-29 ENCOUNTER — Encounter: Payer: Medicare Other | Admitting: Nurse Practitioner

## 2014-11-05 ENCOUNTER — Ambulatory Visit (INDEPENDENT_AMBULATORY_CARE_PROVIDER_SITE_OTHER): Payer: Medicare Other | Admitting: Nurse Practitioner

## 2014-11-05 ENCOUNTER — Encounter: Payer: Self-pay | Admitting: Nurse Practitioner

## 2014-11-05 VITALS — BP 130/82 | HR 87 | Temp 98.0°F | Resp 18 | Ht 70.0 in | Wt 219.0 lb

## 2014-11-05 DIAGNOSIS — J302 Other seasonal allergic rhinitis: Secondary | ICD-10-CM | POA: Diagnosis not present

## 2014-11-05 DIAGNOSIS — I10 Essential (primary) hypertension: Secondary | ICD-10-CM | POA: Diagnosis not present

## 2014-11-05 MED ORDER — POLYETHYL GLYCOL-PROPYL GLYCOL 0.4-0.3 % OP SOLN: 1.0000 [drp] | OPHTHALMIC | Status: DC | PRN

## 2014-11-05 NOTE — Progress Notes (Signed)
Patient ID: David [name], male   DOB: 1946-05-30, 69 y.o.   MRN: [id number]    PCP: Hollace Kinnier, DO  Allergies  Allergen Reactions  . Celebrex [Celecoxib]   . Erythromycin   . Oxycontin [Oxycodone Hcl]   . Penicillins   . Tetracyclines & Related     Chief Complaint  Patient presents with  . Medical Management of Chronic Issues    follow-up on blood pressure     HPI: Patient is a 69 y.o. male seen in the office today to follow up blood pressure. Pt currently on lisinopril and reports compliance on medication. Does report he likes salt but has cut back. Does not do any exercise.   Reports increase fatigue, reports Claritin is making him sleepy but also reports he does not sleep at night due to lack of activity during the day.   Review of Systems:  Review of Systems  Constitutional: Negative for activity change, appetite change and unexpected weight change.  Respiratory: Negative for shortness of breath.   Cardiovascular: Negative for chest pain, palpitations and leg swelling.  Allergic/Immunologic: Positive for environmental allergies.  Neurological: Negative for headaches.  Psychiatric/Behavioral: Positive for sleep disturbance.    Past Medical History  Diagnosis Date  . Atherosclerosis of native arteries of the extremities, unspecified   . Myalgia and myositis, unspecified   . Unspecified hypertrophic and atrophic condition of skin   . Hyperpotassemia   . Nocturia   . Rotator cuff (capsule) sprain   . Anxiety state, unspecified   . Contact dermatitis and other eczema due to other chemical products   . Insomnia, unspecified   . Herpes zoster with unspecified complication   . Lumbago   . Lumbago   . Pure hyperglyceridemia   . Other abnormal blood chemistry   . Obesity, unspecified   . Tobacco use disorder   . Other headache syndromes(339.89)   . Unspecified essential hypertension   . Asymptomatic varicose veins   . Hemorrhoids, external   . Allergic  rhinitis due to pollen   . 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 Osteoarthrosis, unspecified whether generalized or localized, unspecified site   . Unspecified sleep apnea   . Rash and other nonspecific skin eruption   . Headache(784.0)   . Flatulence, eructation, and gas pain   . Routine general medical examination at a health care facility    Past Surgical History  Procedure Laterality Date  . Hemorrhoid surgery    . Varicose vein surgery     Social History:   reports that he has been smoking Cigarettes.  He has a 55 pack-year smoking history. He does not have any smokeless tobacco history on file. He reports that he does not drink alcohol or use illicit drugs.  Family History  Problem Relation Age of Onset  . Diabetes Mother   . Heart disease Mother   . Stroke Mother   . Stroke Father     Medications: Patient's Medications  New Prescriptions   No medications on file  Previous Medications   LISINOPRIL (PRINIVIL,ZESTRIL) 5 MG TABLET    Take 1 tablet (5 mg total) by mouth daily.   LORATADINE (CLARITIN) 10 MG TABLET    Take 10 mg by mouth daily.    OLOPATADINE (PATANOL) 0.1 % OPHTHALMIC SOLUTION    Place 1 drop into both eyes 2 (two) times daily.   POLYETHYL GLYCOL-PROPYL GLYCOL (SYSTANE) 0.4-0.3 % SOLN    Place 1-2 drops into both eyes  as needed. Used for dry eyes   SIMVASTATIN (ZOCOR) 20 MG TABLET    TAKE 1 TABLET BY MOUTH EVERY DAY AT 6PM   VENLAFAXINE XR (EFFEXOR-XR) 37.5 MG 24 HR CAPSULE    TAKE ONE CAPSULE BY MOUTH EVERY DAY WITH BREAKFAST  Modified Medications   No medications on file  Discontinued Medications   No medications on file     Physical Exam:  Filed Vitals:   11/05/14 1614  BP: 130/82  Pulse: 87  Temp: 98 F (36.7 C)  TempSrc: Oral  Resp: 18  Height: 5\' 10"  (1.778 m)  Weight: 219 lb (99.338 kg)  SpO2: 94%    Physical Exam  Constitutional: He is oriented to person, place, and time. He appears well-developed and  well-nourished. No distress.  Eyes:  Watery eyes, nasal congestion  Cardiovascular: Normal rate, regular rhythm, normal heart sounds and intact distal pulses.   Pulmonary/Chest: Effort normal and breath sounds normal.  Neurological: He is alert and oriented to person, place, and time.  Skin: Skin is warm and dry.  Psychiatric: He has a normal mood and affect.    Labs reviewed: Basic Metabolic Panel:  Recent Labs  12/09/13 0838 04/08/14 0929 07/13/14 0826  NA 140 139 137  K 4.2 4.4 4.3  CL 105 100 102  CO2 22 22 23   GLUCOSE 130* 99 117*  BUN 13 13 11   CREATININE 0.67* 0.77 0.73*  CALCIUM 8.7 9.4 9.4   Liver Function Tests:  Recent Labs  12/09/13 0838 04/08/14 0929  AST 19 19  ALT 17 13  ALKPHOS 66 78  BILITOT 0.4 0.4  PROT 6.4 6.7   No results for input(s): LIPASE, AMYLASE in the last 8760 hours. No results for input(s): AMMONIA in the last 8760 hours. CBC:  Recent Labs  12/09/13 0838 04/08/14 0929 10/13/14 0817  WBC 6.2 8.5 7.5  NEUTROABS 3.4 4.8 4.0  HGB 15.7 16.0 16.2  HCT 47.2 47.3 49.1  MCV 93 95 94  PLT  --  241  --    Lipid Panel:  Recent Labs  04/08/14 0929 07/13/14 0826 10/13/14 0817  CHOL 112 114 126  HDL 30* 27* 33*  LDLCALC 53 57 61  TRIG 144 150* 161*  CHOLHDL 3.7 4.2 3.8   TSH: No results for input(s): TSH in the last 8760 hours. A1C: Lab Results  Component Value Date   HGBA1C 6.0* 10/13/2014     Assessment/Plan 1. Seasonal allergies -conts on eye drops and may change Claritin to qhs  2. Essential hypertension, benign -blood pressure improved today. Will cont lisinopril -educated to limit salt and foods high in sodium  -to increase activity which will also help with sleep -encouraged lifestyle modifications

## 2014-11-09 ENCOUNTER — Other Ambulatory Visit: Payer: Self-pay | Admitting: *Deleted

## 2014-11-09 MED ORDER — POLYETHYL GLYCOL-PROPYL GLYCOL 0.4-0.3 % OP SOLN: 1.0000 [drp] | OPHTHALMIC | Status: DC | PRN

## 2014-11-09 NOTE — Telephone Encounter (Signed)
[employer]

## 2015-02-01 ENCOUNTER — Encounter: Payer: Self-pay | Admitting: Internal Medicine

## 2015-02-01 ENCOUNTER — Ambulatory Visit (INDEPENDENT_AMBULATORY_CARE_PROVIDER_SITE_OTHER): Payer: Medicare Other | Admitting: Internal Medicine

## 2015-02-01 VITALS — BP 120/86 | HR 72 | Temp 97.4°F | Resp 20 | Ht 70.0 in | Wt 217.0 lb

## 2015-02-01 DIAGNOSIS — H1045 Other chronic allergic conjunctivitis: Secondary | ICD-10-CM

## 2015-02-01 DIAGNOSIS — J302 Other seasonal allergic rhinitis: Secondary | ICD-10-CM | POA: Diagnosis not present

## 2015-02-01 DIAGNOSIS — R739 Hyperglycemia, unspecified: Secondary | ICD-10-CM

## 2015-02-01 DIAGNOSIS — Z720 Tobacco use: Secondary | ICD-10-CM

## 2015-02-01 DIAGNOSIS — M545 Low back pain, unspecified: Secondary | ICD-10-CM

## 2015-02-01 DIAGNOSIS — F329 Major depressive disorder, single episode, unspecified: Secondary | ICD-10-CM

## 2015-02-01 DIAGNOSIS — E782 Mixed hyperlipidemia: Secondary | ICD-10-CM

## 2015-02-01 DIAGNOSIS — F17200 Nicotine dependence, unspecified, uncomplicated: Secondary | ICD-10-CM

## 2015-02-01 DIAGNOSIS — F32A Depression, unspecified: Secondary | ICD-10-CM

## 2015-02-01 DIAGNOSIS — G4700 Insomnia, unspecified: Secondary | ICD-10-CM

## 2015-02-01 DIAGNOSIS — I10 Essential (primary) hypertension: Secondary | ICD-10-CM

## 2015-02-01 MED ORDER — OLOPATADINE HCL 0.1 % OP SOLN: 1.0000 [drp] | Freq: Two times a day (BID) | OPHTHALMIC | Status: DC

## 2015-02-01 MED ORDER — POLYETHYL GLYCOL-PROPYL GLYCOL 0.4-0.3 % OP SOLN: 1.0000 [drp] | OPHTHALMIC | Status: DC | PRN

## 2015-02-01 NOTE — Patient Instructions (Signed)
Cut out your bedtime cigarette. Stop drinking caffeine at your evening meal.   These things should help you sleep better.  If, after a week of this routine, you still cannot sleep at night, let me know and we can try a medication.

## 2015-02-01 NOTE — Progress Notes (Signed)
Patient ID: David [name], male   DOB: 08-27-1945, 69 y.o.   MRN: [id number]   Location:  [facility]  Code Status: full code Goals of Care: Advanced Directive information Does patient have an advance directive?: No, Would patient like information on creating an advanced directive?: Yes - Educational materials given (at a previous appt.  has not yet completed.)   Chief Complaint  Patient presents with  . Medical Management of Chronic Issues    3 month folow-up    HPI: Patient is a 69 y.o. white male seen in the office today for medical mgt of his chronic diseases.  3 wks of insomnia:  Has been going to bed at midnight and does not fall asleep until 2-3am, this am 6am.  Quits drinking tea at 7:30-8.  Is getting a lot of caffeine and also smoking more.   Last cigarette is just before bed. Is not waking up to urinate.  Gets up 1-2x per night.    Hypertension:  Well controlled at present and hasn't taken lisinopril since friday.  No side effects, but doesn't want to stay on medicine forever.        Hyperlipidemia: Is on zocor. Last LDL was under good control 61.  TG were still mildly elevated at 161.    Hyperglycemia:  Last hba1c was 6.    Obesity: He is down two lbs.  Is not eating as many potatoes and as much bread.  Is eating more fruit.  Still not exercising.    Tobacco abuse: Is smoking more than ever for the past 3-4 wks.  Is getting more nicotine.      Depression/anxiety/PTSD:  Continues on effexor for this.  His father passed away before his last visit.    Allergies: Eyes are burning like crazy.  Has been diagnosed with dry eyes and is out of his systane and the patanol I previously prescribed.   Low back pain:  Now says it's midline.   Happens if he's up standing or walking a lot.  Has not had to take any pain medication since he ran out of his previous prescription of ibuprofen.  Still mowing his yard and family member's yard.  Does  take a break.    Review of Systems:  Review of Systems  Constitutional: Negative for fever and chills.  HENT: Negative for congestion.   Eyes: Positive for pain and redness.  Respiratory: Negative for cough and shortness of breath.   Cardiovascular: Negative for chest pain and leg swelling.  Gastrointestinal: Positive for constipation. Negative for abdominal pain.       Fruit has been helping  Genitourinary: Negative for dysuria, urgency, frequency and hematuria.  Musculoskeletal: Positive for back pain. Negative for falls.  Skin: Negative for itching and rash.  Neurological: Negative for dizziness and loss of consciousness.  Endo/Heme/Allergies: Does not bruise/bleed easily.  Psychiatric/Behavioral: Positive for depression. Negative for memory loss. The patient is nervous/anxious and has insomnia.     Past Medical History  Diagnosis Date  . Atherosclerosis of native arteries of the extremities, unspecified   . Myalgia and myositis, unspecified   . Unspecified hypertrophic and atrophic condition of skin   . Hyperpotassemia   . Nocturia   . Rotator cuff (capsule) sprain   . Anxiety state, unspecified   . Contact dermatitis and other eczema due to other chemical products   . Insomnia, unspecified   . Herpes zoster with unspecified complication   . Lumbago   .  Lumbago   . Pure hyperglyceridemia   . Other abnormal blood chemistry   . Obesity, unspecified   . Tobacco use disorder   . Other headache syndromes(339.89)   . Unspecified essential hypertension   . Asymptomatic varicose veins   . Hemorrhoids, external   . Allergic rhinitis due to pollen   . 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 Osteoarthrosis, unspecified whether generalized or localized, unspecified site   . Unspecified sleep apnea   . Rash and other nonspecific skin eruption   . Headache(784.0)   . Flatulence, eructation, and gas pain   . Routine general medical examination at a  health care facility     Past Surgical History  Procedure Laterality Date  . Hemorrhoid surgery    . Varicose vein surgery      Allergies  Allergen Reactions  . Celebrex [Celecoxib]   . Erythromycin   . Oxycontin [Oxycodone Hcl]   . Penicillins   . Tetracyclines & Related    Medications: Patient's Medications  New Prescriptions   No medications on file  Previous Medications   LISINOPRIL (PRINIVIL,ZESTRIL) 5 MG TABLET    Take 1 tablet (5 mg total) by mouth daily.   LORATADINE (CLARITIN) 10 MG TABLET    Take 10 mg by mouth daily.    OLOPATADINE (PATANOL) 0.1 % OPHTHALMIC SOLUTION    Place 1 drop into both eyes 2 (two) times daily.   POLYETHYL GLYCOL-PROPYL GLYCOL (SYSTANE) 0.4-0.3 % SOLN    Place 1-2 drops into both eyes as needed. Used for dry eyes   SIMVASTATIN (ZOCOR) 20 MG TABLET    TAKE 1 TABLET BY MOUTH EVERY DAY AT 6PM   VENLAFAXINE XR (EFFEXOR-XR) 37.5 MG 24 HR CAPSULE    TAKE ONE CAPSULE BY MOUTH EVERY DAY WITH BREAKFAST  Modified Medications   No medications on file  Discontinued Medications   No medications on file    Physical Exam: Filed Vitals:   02/01/15 0831  BP: 120/86  Pulse: 72  Temp: 97.4 F (36.3 C)  TempSrc: Oral  Resp: 20  Height: 5\' 10"  (1.778 m)  Weight: 217 lb (98.431 kg)  SpO2: 95%   Physical Exam  Constitutional: He is oriented to person, place, and time. He appears well-developed and well-nourished. No distress.  Cardiovascular: Normal rate, regular rhythm, normal heart sounds and intact distal pulses.   Pulmonary/Chest: Effort normal and breath sounds normal.  Abdominal: Soft. Bowel sounds are normal. He exhibits no distension. There is no tenderness.  Abdominal obesity  Musculoskeletal: Normal range of motion. He exhibits no edema or tenderness.  Neurological: He is alert and oriented to person, place, and time.  Skin: Skin is warm and dry.  Psychiatric: He has a normal mood and affect.    Labs reviewed: Basic Metabolic  Panel:  Recent Labs  04/08/14 0929 07/13/14 0826  NA 139 137  K 4.4 4.3  CL 100 102  CO2 22 23  GLUCOSE 99 117*  BUN 13 11  CREATININE 0.77 0.73*  CALCIUM 9.4 9.4   Liver Function Tests:  Recent Labs  04/08/14 0929  AST 19  ALT 13  ALKPHOS 78  BILITOT 0.4  PROT 6.7   No results for input(s): LIPASE, AMYLASE in the last 8760 hours. No results for input(s): AMMONIA in the last 8760 hours. CBC:  Recent Labs  04/08/14 0929 10/13/14 0817  WBC 8.5 7.5  NEUTROABS 4.8 4.0  HGB 16.0 16.2  HCT 47.3 49.1  MCV  95 94  PLT 241  --    Lipid Panel:  Recent Labs  04/08/14 0929 07/13/14 0826 10/13/14 0817  CHOL 112 114 126  HDL 30* 27* 33*  LDLCALC 53 57 61  TRIG 144 150* 161*  CHOLHDL 3.7 4.2 3.8   Lab Results  Component Value Date   HGBA1C 6.0* 10/13/2014   Assessment/Plan 1. Insomnia -advised to try conservative measures first:  Cut out bedtime cigarette and caffeinated beverages from evening meal and after -if he still can't sleep in a week, we'll try a medication like melatonin -I have not been able to get him to exercise  2. Seasonal allergies -ongoing itchy, dry, red, eyes - olopatadine (PATANOL) 0.1 % ophthalmic solution; Place 1 drop into both eyes 2 (two) times daily. Antihistamine  Dispense: 5 mL; Refill: 0 - Polyethyl Glycol-Propyl Glycol (SYSTANE) 0.4-0.3 % SOLN; Place 1-2 drops into both eyes as needed. Used for dry eyes  Dispense: 5 mL; Refill: 4  3. Essential hypertension, benign -bp at goal without taking his lisinopril so will stop b/c he's not taking anyway and bp was wnl - Comprehensive metabolic panel - Basic metabolic panel; Future  4. Mixed hyperlipidemia - cont zocor therapy and f/u lipids today and again in 3 mos - Lipid panel - Lipid panel; Future  5. Tobacco use disorder -start cutting back by stopping nighttime cigarette  6. Depression -ongoing, cont effexor--less than 6 mos since father's death and he seems calmer with  this  7. Hyperglycemia -has been in prediabetic range, cont to cut down on potatoes and bread which have been culprits for him -check sugar average today and in 3 mos -encouraged exercise, but he's yet to do any - Hemoglobin A1c - Hemoglobin A1c; Future  8. Chronic allergic conjunctivitis -worse b/c ran out of drops; resume patanol for a month and systane ongoing for dry eyes  9. Midline low back pain without sciatica -better lately as long as he isn't too active -has not required medication  Labs/tests ordered: Orders Placed This Encounter  Procedures  . Hemoglobin A1c  . Comprehensive metabolic panel    Order Specific Question:  Has the patient fasted?    Answer:  Yes  . Lipid panel    Order Specific Question:  Has the patient fasted?    Answer:  Yes  . Hemoglobin A1c    Standing Status: Future     Number of Occurrences:      Standing Expiration Date: 08/04/2015  . Lipid panel    Standing Status: Future     Number of Occurrences:      Standing Expiration Date: 08/04/2015    Order Specific Question:  Has the patient fasted?    Answer:  Yes  . Basic metabolic panel    Standing Status: Future     Number of Occurrences:      Standing Expiration Date: 08/04/2015    Order Specific Question:  Has the patient fasted?    Answer:  Yes    Next appt:  3 mos with labs before  Gedalya Jim L. Cleon Thoma, D.O. [facility] [address], [place] [address] Cell Phone (Mon-Fri 8am-5pm):  [phone] On Call:  [phone] & follow prompts after 5pm & weekends Office Phone:  [phone] Office Fax:  [phone]

## 2015-02-02 LAB — COMPREHENSIVE METABOLIC PANEL
ALT: 12 IU/L (ref 0–44)
AST: 14 IU/L (ref 0–40)
Albumin/Globulin Ratio: 1.6 (ref 1.1–2.5)
Albumin: 4.1 g/dL (ref 3.6–4.8)
Alkaline Phosphatase: 73 IU/L (ref 39–117)
BUN/Creatinine Ratio: 15 (ref 10–22)
BUN: 12 mg/dL (ref 8–27)
Bilirubin Total: 0.4 mg/dL (ref 0.0–1.2)
CO2: 24 mmol/L (ref 18–29)
Calcium: 9.3 mg/dL (ref 8.6–10.2)
Chloride: 101 mmol/L (ref 97–108)
Creatinine, Ser: 0.79 mg/dL (ref 0.76–1.27)
GFR calc Af Amer: 106 mL/min/{1.73_m2} (ref >59)
GFR calc non Af Amer: 92 mL/min/{1.73_m2} (ref >59)
Globulin, Total: 2.6 g/dL (ref 1.5–4.5)
Glucose: 112 mg/dL — ABNORMAL HIGH (ref 65–99)
Potassium: 5.3 mmol/L — ABNORMAL HIGH (ref 3.5–5.2)
Sodium: 141 mmol/L (ref 134–144)
Total Protein: 6.7 g/dL (ref 6.0–8.5)

## 2015-02-02 LAB — HEMOGLOBIN A1C
Est. average glucose Bld gHb Est-mCnc: 120 mg/dL
Hgb A1c MFr Bld: 5.8 % — ABNORMAL HIGH (ref 4.8–5.6)

## 2015-02-02 LAB — LIPID PANEL
Chol/HDL Ratio: 4.5 ratio units (ref 0.0–5.0)
Cholesterol, Total: 130 mg/dL (ref 100–199)
HDL: 29 mg/dL — ABNORMAL LOW (ref >39)
LDL Calculated: 59 mg/dL (ref 0–99)
Triglycerides: 212 mg/dL — ABNORMAL HIGH (ref 0–149)
VLDL Cholesterol Cal: 42 mg/dL — ABNORMAL HIGH (ref 5–40)

## 2015-02-25 IMAGING — CR DG CERVICAL SPINE COMPLETE 4+V
6 series · 6 of 6 positions shown · non-contrast
Comparison: None.

CLINICAL DATA: Initial evaluation for chronic severe pain left
shoulder and left neck

EXAM:
CERVICAL SPINE  4+ VIEWS

[w c-spine lat]
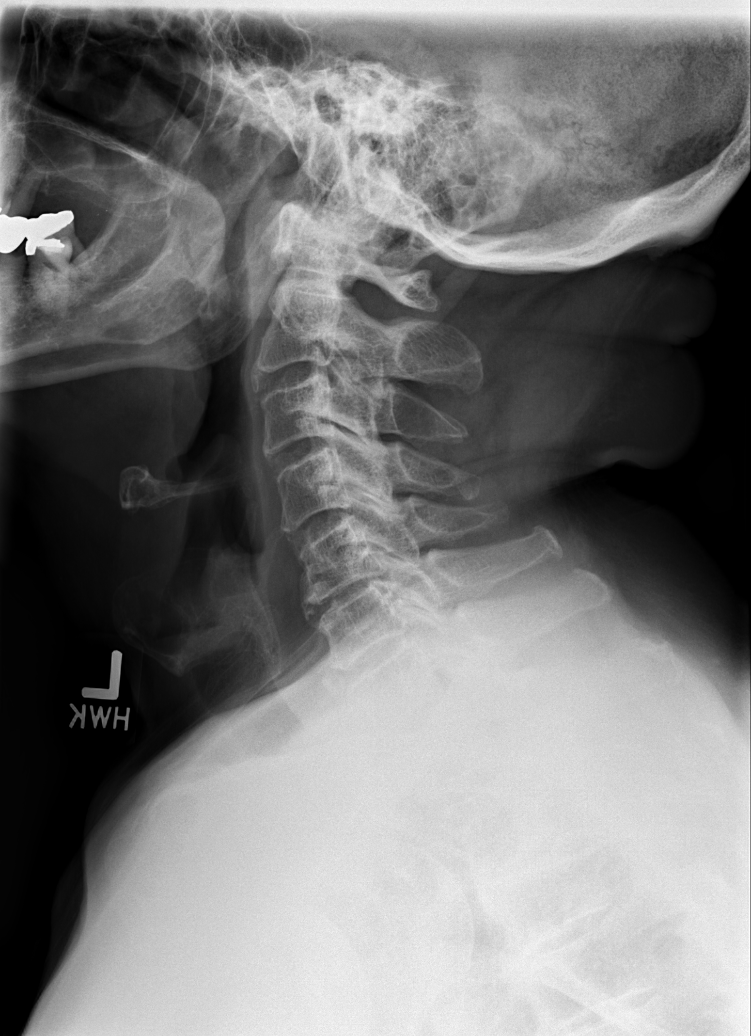

[w c-spine oblique (1 of 2)]
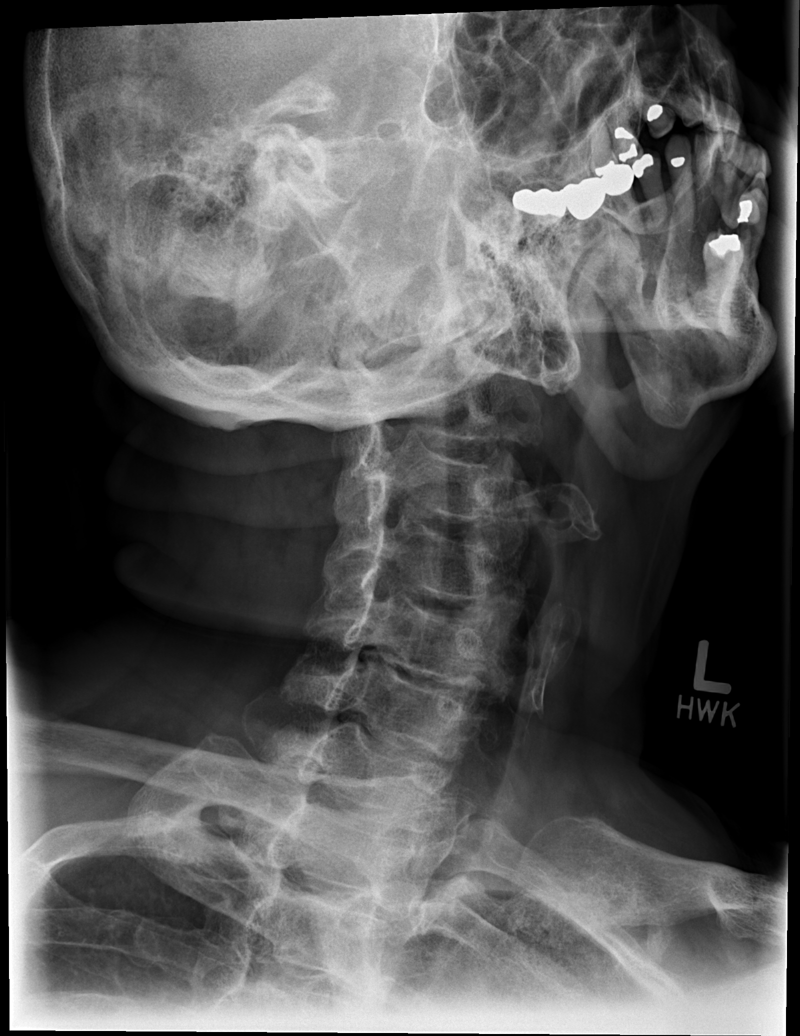

[w c-spine oblique (2 of 2)]
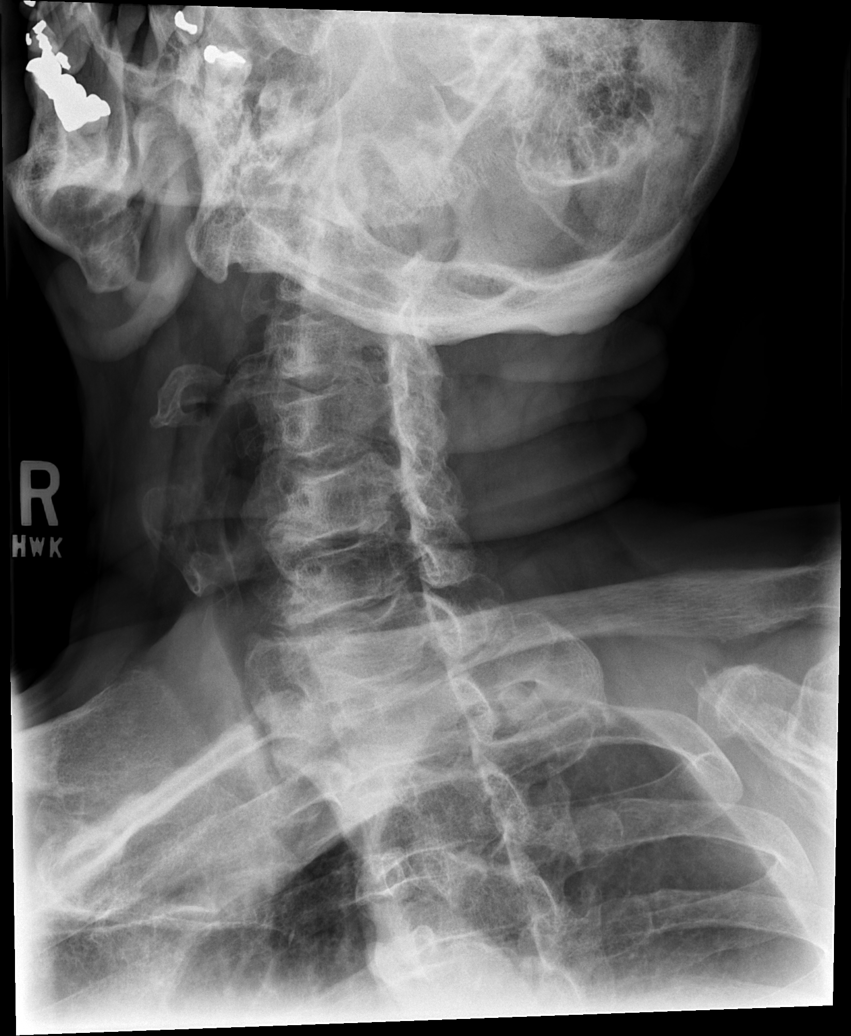

[w c-spine a.p. *]
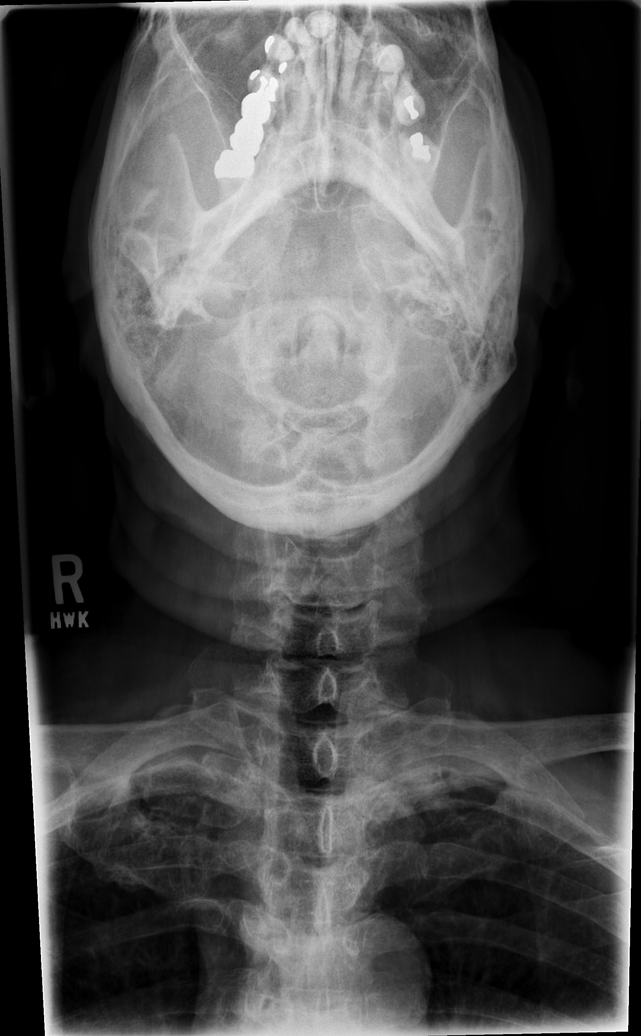

[w c-spine odontoid *]
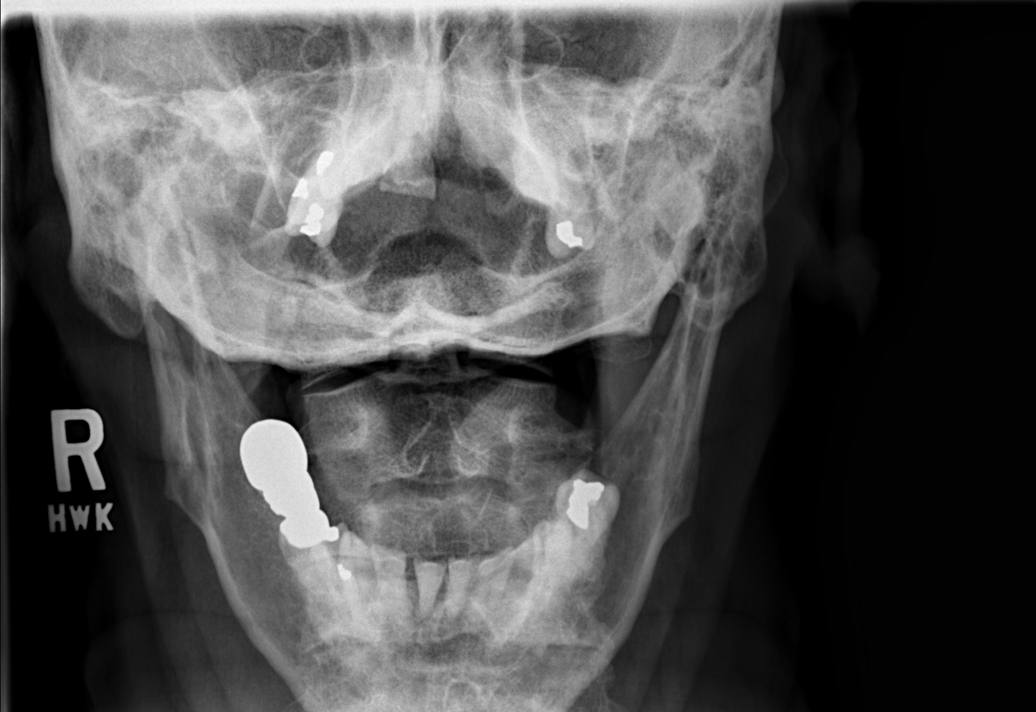

[w swimmers view *]
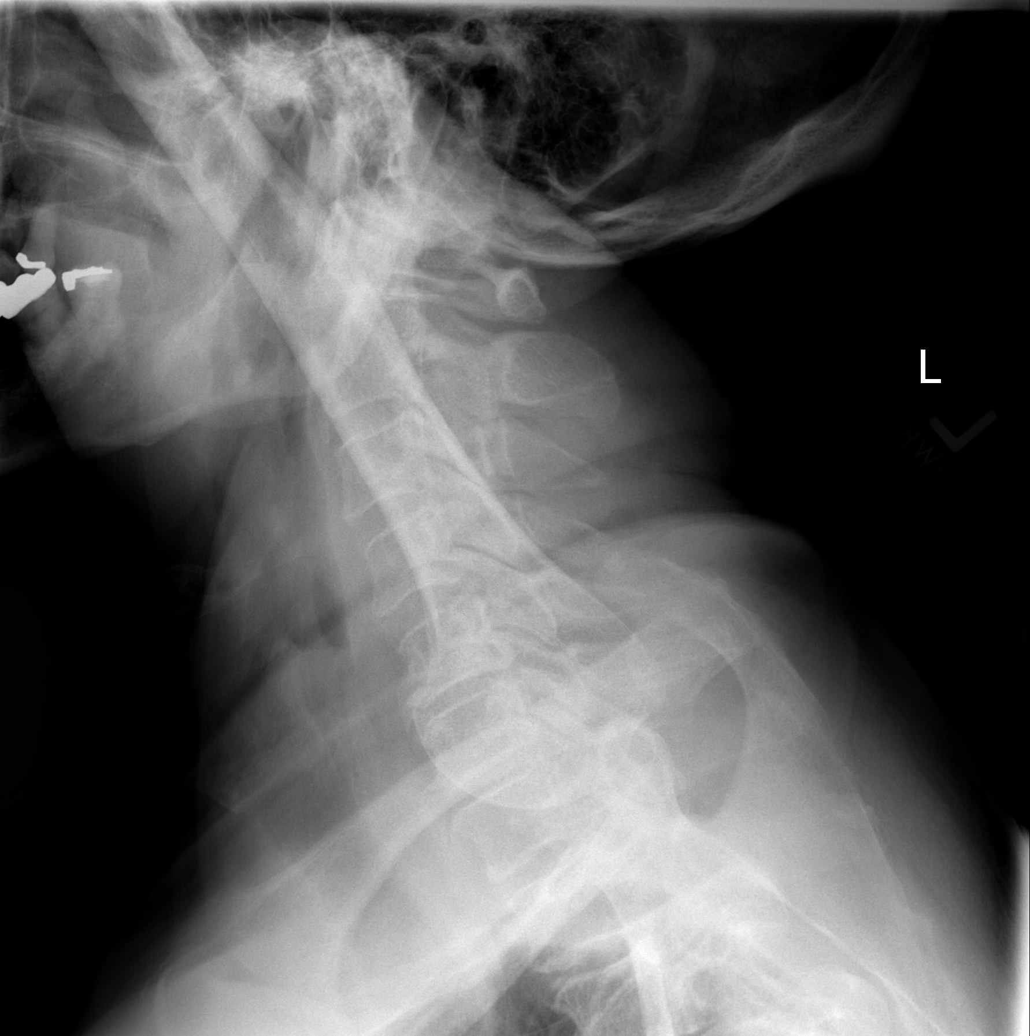

[6 of 6 positions shown; findings below may reference images not displayed]

FINDINGS: Normal alignment with no prevertebral soft tissue swelling or
fracture. Mild degenerative disc disease at C2-3, C3-4, and C4-5
with moderate C5-6 and C6-7 degenerative disc disease. Mild C7-T1
degenerative disc disease.

Degenerative facet change all levels throughout the cervical spine.
Bilateral C4-5 and C5-6 foraminal narrowing.
IMPRESSION: No acute findings.  Multilevel chronic degenerative change.

## 2015-05-03 ENCOUNTER — Other Ambulatory Visit: Payer: Self-pay | Admitting: Internal Medicine

## 2015-05-03 DIAGNOSIS — H1045 Other chronic allergic conjunctivitis: Secondary | ICD-10-CM | POA: Diagnosis not present

## 2015-05-03 DIAGNOSIS — J309 Allergic rhinitis, unspecified: Secondary | ICD-10-CM | POA: Diagnosis not present

## 2015-05-04 ENCOUNTER — Other Ambulatory Visit: Payer: Medicare Other

## 2015-05-04 DIAGNOSIS — E782 Mixed hyperlipidemia: Secondary | ICD-10-CM

## 2015-05-04 DIAGNOSIS — R739 Hyperglycemia, unspecified: Secondary | ICD-10-CM

## 2015-05-04 DIAGNOSIS — I10 Essential (primary) hypertension: Secondary | ICD-10-CM

## 2015-05-05 LAB — BASIC METABOLIC PANEL
BUN/Creatinine Ratio: 20 (ref 10–22)
BUN: 13 mg/dL (ref 8–27)
CO2: 24 mmol/L (ref 18–29)
Calcium: 9.7 mg/dL (ref 8.6–10.2)
Chloride: 97 mmol/L (ref 97–106)
Creatinine, Ser: 0.65 mg/dL — ABNORMAL LOW (ref 0.76–1.27)
GFR calc Af Amer: 115 mL/min/{1.73_m2} (ref >59)
GFR calc non Af Amer: 99 mL/min/{1.73_m2} (ref >59)
Glucose: 108 mg/dL — ABNORMAL HIGH (ref 65–99)
Potassium: 4.3 mmol/L (ref 3.5–5.2)
Sodium: 138 mmol/L (ref 136–144)

## 2015-05-05 LAB — LIPID PANEL
Chol/HDL Ratio: 3.9 ratio units (ref 0.0–5.0)
Cholesterol, Total: 139 mg/dL (ref 100–199)
HDL: 36 mg/dL — ABNORMAL LOW (ref >39)
LDL Calculated: 70 mg/dL (ref 0–99)
Triglycerides: 166 mg/dL — ABNORMAL HIGH (ref 0–149)
VLDL Cholesterol Cal: 33 mg/dL (ref 5–40)

## 2015-05-05 LAB — HEMOGLOBIN A1C
Est. average glucose Bld gHb Est-mCnc: 126 mg/dL
Hgb A1c MFr Bld: 6 % — ABNORMAL HIGH (ref 4.8–5.6)

## 2015-05-06 ENCOUNTER — Encounter: Payer: Self-pay | Admitting: Internal Medicine

## 2015-05-06 ENCOUNTER — Ambulatory Visit (INDEPENDENT_AMBULATORY_CARE_PROVIDER_SITE_OTHER): Payer: Medicare Other | Admitting: Internal Medicine

## 2015-05-06 VITALS — BP 138/78 | HR 87 | Temp 98.0°F | Resp 20 | Ht 70.0 in | Wt 219.4 lb

## 2015-05-06 DIAGNOSIS — R739 Hyperglycemia, unspecified: Secondary | ICD-10-CM | POA: Diagnosis not present

## 2015-05-06 DIAGNOSIS — F17200 Nicotine dependence, unspecified, uncomplicated: Secondary | ICD-10-CM | POA: Diagnosis not present

## 2015-05-06 DIAGNOSIS — E782 Mixed hyperlipidemia: Secondary | ICD-10-CM | POA: Diagnosis not present

## 2015-05-06 DIAGNOSIS — G4700 Insomnia, unspecified: Secondary | ICD-10-CM | POA: Diagnosis not present

## 2015-05-06 DIAGNOSIS — J302 Other seasonal allergic rhinitis: Secondary | ICD-10-CM | POA: Diagnosis not present

## 2015-05-06 DIAGNOSIS — F32A Depression, unspecified: Secondary | ICD-10-CM

## 2015-05-06 DIAGNOSIS — I10 Essential (primary) hypertension: Secondary | ICD-10-CM

## 2015-05-06 DIAGNOSIS — F329 Major depressive disorder, single episode, unspecified: Secondary | ICD-10-CM

## 2015-05-06 DIAGNOSIS — Z23 Encounter for immunization: Secondary | ICD-10-CM

## 2015-05-06 MED ORDER — MELATONIN 10 MG PO CAPS: 10.0000 mg | ORAL_CAPSULE | Freq: Every evening | ORAL | Status: DC | PRN

## 2015-05-06 MED ORDER — VENLAFAXINE HCL ER 37.5 MG PO CP24: 75.0000 mg | ORAL_CAPSULE | Freq: Every day | ORAL | Status: DC

## 2015-05-06 NOTE — Progress Notes (Signed)
Patient ID: David [name], male   DOB: 1946-06-11, 69 y.o.   MRN: [id number]   Location: [place] Provider: Rexene Edison. Mariea Clonts, D.O., C.M.D.  Goals of Care: Advanced Directive information Does patient have an advance directive?: No, Would patient like information on creating an advanced directive?: No - patient declined information  Chief Complaint  Patient presents with  . Medical Management of Chronic Issues    3 month follow-up for Hypertension, Hyperlipidemia, labs printed  . Immunizations    flu shot today    HPI: Patient is a 69 y.o. male seen in the office today for medical mgt of chronic diseases.  Saw allergist Monday and got medication there.  Had been out of eye drops.  He's kept the box this time.  One drop in each eye twice a day patanol drops.  Second Rx for a different drop.  I going [facility] b/c they will no longer have contract with [facility].   hba1c slightly up from last time (oscillates around 6 range).  TG are finally coming down--near goal now.  Is drinking plenty of water these day.  Does donate blood.    Renal function is good.  Electrolytes normal.  Still not sleeping well.  Will be up 2-3 hrs after he attempts to go to sleep.  Tried the melatonin 5mg  which did not help.    BP at goal:  138/78.    Smoking:  Still smoking the same amount.  Is thinking may be his effexor dose could be increased which might help him.  Admits to some PTSD symptoms, also.    Review of Systems:  Review of Systems  Constitutional: Positive for diaphoresis. Negative for fever and chills.  HENT: Negative for congestion.   Eyes: Negative for blurred vision.  Respiratory: Negative for shortness of breath.   Cardiovascular: Negative for chest pain, palpitations and leg swelling.  Gastrointestinal: Negative for abdominal pain, constipation, blood in stool and melena.  Genitourinary: Negative for dysuria.  Musculoskeletal: Negative for  falls.  Skin: Negative for rash.  Neurological: Negative for dizziness, sensory change and weakness.  Endo/Heme/Allergies: Does not bruise/bleed easily.  Psychiatric/Behavioral: Positive for depression and memory loss. The patient is nervous/anxious and has insomnia.        Flashbacks, nightmares    Past Medical History  Diagnosis Date  . Atherosclerosis of native arteries of the extremities, unspecified   . Myalgia and myositis, unspecified   . Unspecified hypertrophic and atrophic condition of skin   . Hyperpotassemia   . Nocturia   . Rotator cuff (capsule) sprain   . Anxiety state, unspecified   . Contact dermatitis and other eczema due to other chemical products   . Insomnia, unspecified   . Herpes zoster with unspecified complication   . Lumbago   . Lumbago   . Pure hyperglyceridemia   . Other abnormal blood chemistry   . Obesity, unspecified   . Tobacco use disorder   . Other headache syndromes(339.89)   . Unspecified essential hypertension   . Asymptomatic varicose veins   . Hemorrhoids, external   . Allergic rhinitis due to pollen   . 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 Osteoarthrosis, unspecified whether generalized or localized, unspecified site   . Unspecified sleep apnea   . Rash and other nonspecific skin eruption   . Headache(784.0)   . Flatulence, eructation, and gas pain   . Routine general medical examination at a  health care facility     Past Surgical History  Procedure Laterality Date  . Hemorrhoid surgery    . Varicose vein surgery      Allergies  Allergen Reactions  . Celebrex [Celecoxib]   . Erythromycin   . Oxycontin [Oxycodone Hcl]   . Penicillins   . Tetracyclines & Related       Medication List       This list is accurate as of: 05/06/15  9:01 AM.  Always use your most recent med list.               loratadine 10 MG tablet  Commonly known as:  CLARITIN  Take 10 mg by mouth daily.      olopatadine 0.1 % ophthalmic solution  Commonly known as:  PATANOL  Place 1 drop into both eyes 2 (two) times daily. Antihistamine     Polyethyl Glycol-Propyl Glycol 0.4-0.3 % Soln  Commonly known as:  SYSTANE  Place 1-2 drops into both eyes as needed. Used for dry eyes     simvastatin 20 MG tablet  Commonly known as:  ZOCOR  TAKE 1 TABLET BY MOUTH EVERY DAY AT 6PM     venlafaxine XR 37.5 MG 24 hr capsule  Commonly known as:  EFFEXOR-XR  TAKE ONE CAPSULE BY MOUTH EVERY DAY WITH BREAKFAST        Health Maintenance  Topic Date Due  . Hepatitis C Screening  Jun 19, 1946  . INFLUENZA VACCINE  02/08/2016  . COLONOSCOPY  08/31/2020  . TETANUS/TDAP  06/14/2023  . ZOSTAVAX  Completed  . PNA vac Low Risk Adult  Completed    Physical Exam: Filed Vitals:   05/06/15 0835  BP: 138/78  Pulse: 87  Temp: 98 F (36.7 C)  TempSrc: Oral  Resp: 20  Height: 5\' 10"  (1.778 m)  Weight: 219 lb 6.4 oz (99.519 kg)  SpO2: 95%   Body mass index is 31.48 kg/(m^2). Physical Exam  Constitutional: He is oriented to person, place, and time.  Poor hygiene/tobacco odor  Eyes:  Red watery eyes  Cardiovascular: Normal rate, regular rhythm, normal heart sounds and intact distal pulses.   Pulmonary/Chest: Effort normal and breath sounds normal. No respiratory distress.  Abdominal: Soft. Bowel sounds are normal.  Musculoskeletal: Normal range of motion. He exhibits no edema or tenderness.  Neurological: He is alert and oriented to person, place, and time.  Falling asleep sitting in chair in office (refuses apnea testing)  Skin: Skin is warm and dry.  Psychiatric:  Anxious, jokes around a lot    Labs reviewed: Basic Metabolic Panel:  Recent Labs  07/13/14 0826 02/01/15 0917 05/04/15 0812  NA 137 141 138  K 4.3 5.3* 4.3  CL 102 101 97  CO2 23 24 24   GLUCOSE 117* 112* 108*  BUN 11 12 13   CREATININE 0.73* 0.79 0.65*  CALCIUM 9.4 9.3 9.7   Liver Function Tests:  Recent Labs   02/01/15 0917  AST 14  ALT 12  ALKPHOS 73  BILITOT 0.4  PROT 6.7  ALBUMIN 4.1   No results for input(s): LIPASE, AMYLASE in the last 8760 hours. No results for input(s): AMMONIA in the last 8760 hours. CBC:  Recent Labs  10/13/14 0817  WBC 7.5  NEUTROABS 4.0  HGB 16.2  HCT 49.1  MCV 94   Lipid Panel:  Recent Labs  10/13/14 0817 02/01/15 0917 05/04/15 0812  CHOL 126 130 139  HDL 33* 29* 36*  LDLCALC 61 59 70  TRIG 161* 212* 166*  CHOLHDL 3.8 4.5 3.9   Lab Results  Component Value Date   HGBA1C 6.0* 05/04/2015    Assessment/Plan 1. Essential hypertension, benign - bp at goal with current therapy, no changes, avoid smoking - Basic metabolic panel; Future  2. Seasonal allergies -just seen at [facility] allergy yesterday and eye drops reordered b/c pt had never renewed them as directed (had lost Rx number and didn't call their office to get renewal)  3. Insomnia - will try increasing melatonin from 5mg  to 10mg  and see how he does - Melatonin 10 MG CAPS; Take 10 mg by mouth at bedtime as needed (insomnia).  Dispense: 90 capsule; Refill: 3  4. Mixed hyperlipidemia -counseled on diet and exercise -TG remain mildly elevated, but improved - Hemoglobin A1c; Future - Lipid panel; Future  5. Tobacco use disorder -he is trying to cut back and quit, but has PTSD symptoms and has not been able to so will increase effexor to help  6. Depression - feels his PTSD, anxiety and smoking may improve on a higher dose of effexor so will try 75mg  (to take the rest of his 37.5mg  2 daily) - venlafaxine XR (EFFEXOR-XR) 37.5 MG 24 hr capsule; Take 2 capsules (75 mg total) by mouth daily with breakfast.  Dispense: 180 capsule; Refill: 3 -if  This does not help and he's not sleeping, will need to try a different drug for his depression/ptsd/anxiety  7. Hyperglycemia - counseled on diet and exercise -diet has improved some and he's not eating potatoes daily or in as large a  quantity -Hemoglobin A1c; Future  8. Need for prophylactic vaccination and inoculation against influenza - Flu Vaccine QUAD 36+ mos PF IM (Fluarix & Fluzone Quad PF)    Labs/tests ordered:   Orders Placed This Encounter  Procedures  . Flu Vaccine QUAD 36+ mos PF IM (Fluarix & Fluzone Quad PF)  . Basic metabolic panel    Standing Status: Future     Number of Occurrences:      Standing Expiration Date: 11/04/2015    Order Specific Question:  Has the patient fasted?    Answer:  Yes  . Hemoglobin A1c    Standing Status: Future     Number of Occurrences:      Standing Expiration Date: 11/04/2015  . Lipid panel    Standing Status: Future     Number of Occurrences:      Standing Expiration Date: 11/04/2015    Order Specific Question:  Has the patient fasted?    Answer:  Yes    Next appt:  08/04/2015   Rayford Williamsen L. Baylon Santelli, D.O. [facility] [address], [place] [address] Cell Phone (Mon-Fri 8am-5pm):  [phone] On Call:  [phone] & follow prompts after 5pm & weekends Office Phone:  [phone] Office Fax:  [phone]

## 2015-06-14 ENCOUNTER — Telehealth: Payer: Self-pay | Admitting: *Deleted

## 2015-06-14 NOTE — Telephone Encounter (Signed)
David [name] with Dentistry called back and stated that patient needed an extraction and she wants to put him on antibiotic before hand. Please Advise concerns.

## 2015-06-14 NOTE — Telephone Encounter (Signed)
1.  I would suggest keflex.  There's only a 10% cross reactivity with penicillin. Alternatively bactrim if they want to cover MRSA.  2.  He has not had problems with pain meds other than the celebrex so tramadol or norco would be ok.  3.  He does not have known cardiac problems.  Just all of the risk factors for them.

## 2015-06-14 NOTE — Telephone Encounter (Signed)
Manuela Schwartz with [facility] called and stated that patient presented to the office today. They have several questions: 1. They want to prescribe and antibiotic but patient has multiple allergies to antibiotics so they would like to know what they can prescribe safely? 2. What pain medication can patient take safely? 3. Patient stated that he has heart problems so they want to know if he needs to be Pre Medicated and if so which antibiotic?  Please Advise.    (called Manuela Schwartz at [facility] and asked what type of procedure is patient having for antibiotic--LMOM to return call.)

## 2015-06-14 NOTE — Telephone Encounter (Signed)
Faxed Dr. Cyndi Lennert response to [facility] Fax #: [phone]

## 2015-08-04 ENCOUNTER — Other Ambulatory Visit: Payer: Medicare Other

## 2015-08-04 DIAGNOSIS — I10 Essential (primary) hypertension: Secondary | ICD-10-CM | POA: Diagnosis not present

## 2015-08-04 DIAGNOSIS — E782 Mixed hyperlipidemia: Secondary | ICD-10-CM | POA: Diagnosis not present

## 2015-08-04 DIAGNOSIS — R739 Hyperglycemia, unspecified: Secondary | ICD-10-CM | POA: Diagnosis not present

## 2015-08-05 ENCOUNTER — Ambulatory Visit: Payer: Medicare Other | Admitting: Internal Medicine

## 2015-08-05 LAB — LIPID PANEL
Chol/HDL Ratio: 4.5 ratio units (ref 0.0–5.0)
Cholesterol, Total: 152 mg/dL (ref 100–199)
HDL: 34 mg/dL — ABNORMAL LOW (ref >39)
LDL Calculated: 69 mg/dL (ref 0–99)
Triglycerides: 244 mg/dL — ABNORMAL HIGH (ref 0–149)
VLDL Cholesterol Cal: 49 mg/dL — ABNORMAL HIGH (ref 5–40)

## 2015-08-05 LAB — BASIC METABOLIC PANEL
BUN/Creatinine Ratio: 20 (ref 10–22)
BUN: 15 mg/dL (ref 8–27)
CO2: 24 mmol/L (ref 18–29)
Calcium: 9.7 mg/dL (ref 8.6–10.2)
Chloride: 102 mmol/L (ref 96–106)
Creatinine, Ser: 0.74 mg/dL — ABNORMAL LOW (ref 0.76–1.27)
GFR calc Af Amer: 108 mL/min/{1.73_m2} (ref >59)
GFR calc non Af Amer: 93 mL/min/{1.73_m2} (ref >59)
Glucose: 112 mg/dL — ABNORMAL HIGH (ref 65–99)
Potassium: 5.7 mmol/L — ABNORMAL HIGH (ref 3.5–5.2)
Sodium: 142 mmol/L (ref 134–144)

## 2015-08-05 LAB — HEMOGLOBIN A1C
Est. average glucose Bld gHb Est-mCnc: 128 mg/dL
Hgb A1c MFr Bld: 6.1 % — ABNORMAL HIGH (ref 4.8–5.6)

## 2015-08-06 ENCOUNTER — Encounter: Payer: Self-pay | Admitting: Internal Medicine

## 2015-08-06 ENCOUNTER — Ambulatory Visit (INDEPENDENT_AMBULATORY_CARE_PROVIDER_SITE_OTHER): Payer: Medicare Other | Admitting: Internal Medicine

## 2015-08-06 VITALS — BP 132/80 | HR 75 | Temp 97.7°F | Ht 70.0 in | Wt 217.0 lb

## 2015-08-06 DIAGNOSIS — J302 Other seasonal allergic rhinitis: Secondary | ICD-10-CM

## 2015-08-06 DIAGNOSIS — R739 Hyperglycemia, unspecified: Secondary | ICD-10-CM

## 2015-08-06 DIAGNOSIS — F329 Major depressive disorder, single episode, unspecified: Secondary | ICD-10-CM | POA: Diagnosis not present

## 2015-08-06 DIAGNOSIS — I10 Essential (primary) hypertension: Secondary | ICD-10-CM | POA: Diagnosis not present

## 2015-08-06 DIAGNOSIS — E781 Pure hyperglyceridemia: Secondary | ICD-10-CM | POA: Diagnosis not present

## 2015-08-06 DIAGNOSIS — Z7721 Contact with and (suspected) exposure to potentially hazardous body fluids: Secondary | ICD-10-CM

## 2015-08-06 DIAGNOSIS — F17200 Nicotine dependence, unspecified, uncomplicated: Secondary | ICD-10-CM | POA: Diagnosis not present

## 2015-08-06 DIAGNOSIS — G4700 Insomnia, unspecified: Secondary | ICD-10-CM

## 2015-08-06 DIAGNOSIS — Z1159 Encounter for screening for other viral diseases: Secondary | ICD-10-CM | POA: Diagnosis not present

## 2015-08-06 DIAGNOSIS — F32A Depression, unspecified: Secondary | ICD-10-CM

## 2015-08-06 MED ORDER — OLOPATADINE HCL 0.1 % OP SOLN: 1.0000 [drp] | Freq: Two times a day (BID) | OPHTHALMIC | Status: DC

## 2015-08-06 MED ORDER — VENLAFAXINE HCL ER 75 MG PO CP24: 75.0000 mg | ORAL_CAPSULE | Freq: Every day | ORAL | Status: DC

## 2015-08-06 NOTE — Progress Notes (Signed)
Patient ID: David [name], male   DOB: 15-Sep-1945, 70 y.o.   MRN: [id number]   Location: [place] Provider: Rexene Edison. Mariea Clonts, D.O., C.M.D.  Goals of Care: Advanced Directive information Does patient have an advance directive?: No Has been discussed in the past, but he has not completed living will or hcpoa  Chief Complaint  Patient presents with  . Medical Management of Chronic Issues    blood pressure, cholesterol, depression, hyperglycemia, insomnia, labs    HPI: Patient is a 70 y.o. male seen in the office today for med mgt of chronic diseases.    He stopped smoking a week ago.  Stopped cold [place] and has not had a desire.  Feels better and everything tastes better.  Unfortunately, he is wanting to eat more.  Lost 2 lbs.  Has been more hyper than usual b/c his wife was in a car accident and bruised a kidney and had broke some ribs, she had hit black ice and hit a tree.  She spent three days in the hospital.    Insomnia:  Sleeps off and on--melatonin not as effective anymore.    Depression:  Continues on effexor for this.  We tried a couple others but didn't tolerate.    Hyperlipidemia:  He's been on too much rice now instead of taters b/c his wife is home (she is [place]).  He is concerned she is starting to forget b/c she's been asking him about wanting toast in the mornings, but he is trying to avoid that.    Hyperkalemia on labs:  Eats no more than one banana a day.    BP when gave blood was 130/68 and today was also good.    Pain (sciatica and shoulder pain) better lately b/c he's putting them out of his mind.    Review of Systems:  Review of Systems  Constitutional: Positive for weight loss. Negative for fever, chills and malaise/fatigue.  HENT: Positive for hearing loss. Negative for congestion.   Eyes: Negative for blurred vision.       Glasses  Respiratory: Negative for cough and shortness of breath.        Breathing better off cigarettes    Cardiovascular: Negative for chest pain, palpitations and leg swelling.  Gastrointestinal: Negative for heartburn, abdominal pain, constipation, blood in stool and melena.  Genitourinary: Negative for dysuria, urgency and frequency.  Musculoskeletal: Positive for back pain and joint pain. Negative for myalgias and falls.  Skin: Negative for rash.  Neurological: Negative for dizziness, loss of consciousness and weakness.  Endo/Heme/Allergies: Does not bruise/bleed easily.  Psychiatric/Behavioral: Positive for depression. Negative for memory loss. The patient is nervous/anxious and has insomnia.        PTSD    Past Medical History  Diagnosis Date  . Atherosclerosis of native arteries of the extremities, unspecified   . Myalgia and myositis, unspecified   . Unspecified hypertrophic and atrophic condition of skin   . Hyperpotassemia   . Nocturia   . Rotator cuff (capsule) sprain   . Anxiety state, unspecified   . Contact dermatitis and other eczema due to other chemical products   . Insomnia, unspecified   . Herpes zoster with unspecified complication   . Lumbago   . Lumbago   . Pure hyperglyceridemia   . Other abnormal blood chemistry   . Obesity, unspecified   . Tobacco use disorder   . Other headache syndromes(339.89)   . Unspecified essential hypertension   . Asymptomatic varicose veins   .  Hemorrhoids, external   . Allergic rhinitis due to pollen   . 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 Osteoarthrosis, unspecified whether generalized or localized, unspecified site   . Unspecified sleep apnea   . Rash and other nonspecific skin eruption   . Headache(784.0)   . Flatulence, eructation, and gas pain   . Routine general medical examination at a health care facility     Past Surgical History  Procedure Laterality Date  . Hemorrhoid surgery    . Varicose vein surgery      Allergies  Allergen Reactions  . Celebrex [Celecoxib]   .  Erythromycin   . Oxycontin [Oxycodone Hcl]   . Penicillins   . Tetracyclines & Related       Medication List       This list is accurate as of: 08/06/15 10:12 AM.  Always use your most recent med list.               loratadine 10 MG tablet  Commonly known as:  CLARITIN  Take 10 mg by mouth daily.     Melatonin 10 MG Caps  Take 10 mg by mouth at bedtime as needed (insomnia).     olopatadine 0.1 % ophthalmic solution  Commonly known as:  PATANOL  Place 1 drop into both eyes 2 (two) times daily. Antihistamine     PATADAY 0.2 % Soln  Generic drug:  Olopatadine HCl  One drop both eyes     Polyethyl Glycol-Propyl Glycol 0.4-0.3 % Soln  Commonly known as:  SYSTANE  Place 1-2 drops into both eyes as needed. Used for dry eyes     simvastatin 20 MG tablet  Commonly known as:  ZOCOR  TAKE 1 TABLET BY MOUTH EVERY DAY AT 6PM     venlafaxine XR 37.5 MG 24 hr capsule  Commonly known as:  EFFEXOR-XR  Take 2 capsules (75 mg total) by mouth daily with breakfast.        Health Maintenance  Topic Date Due  . Hepatitis C Screening  [birth year]  . INFLUENZA VACCINE  02/08/2016  . COLONOSCOPY  08/31/2020  . TETANUS/TDAP  06/14/2023  . ZOSTAVAX  Completed  . PNA vac Low Risk Adult  Completed    Physical Exam: Filed Vitals:   08/06/15 0953  BP: 132/80  Pulse: 75  Temp: 97.7 F (36.5 C)  TempSrc: Oral  Height: 5\' 10"  (1.778 m)  Weight: 217 lb (98.431 kg)  SpO2: 92%   Body mass index is 31.14 kg/(m^2). Physical Exam  Constitutional: He is oriented to person, place, and time. He appears well-developed and well-nourished. No distress.  HENT:  Head: Normocephalic and atraumatic.  Eyes:  Glasses; watery eyes  Cardiovascular: Normal rate, regular rhythm and normal heart sounds.   Pulmonary/Chest: Effort normal and breath sounds normal. He has no wheezes.  Abdominal: Soft. Bowel sounds are normal. He exhibits no distension. There is no tenderness.  Musculoskeletal: Normal  range of motion. He exhibits no edema or tenderness.  Neurological: He is alert and oriented to person, place, and time.  Skin: Skin is warm and dry.  Psychiatric: He has a normal mood and affect.    Labs reviewed: Basic Metabolic Panel:  Recent Labs  02/01/15 0917 05/04/15 0812 08/04/15 0854  NA 141 138 142  K 5.3* 4.3 5.7*  CL 101 97 102  CO2 24 24 24   GLUCOSE 112* 108* 112*  BUN 12 13 15   CREATININE 0.79 0.65* 0.74*  CALCIUM 9.3 9.7 9.7   Liver Function Tests:  Recent Labs  02/01/15 0917  AST 14  ALT 12  ALKPHOS 73  BILITOT 0.4  PROT 6.7  ALBUMIN 4.1   No results for input(s): LIPASE, AMYLASE in the last 8760 hours. No results for input(s): AMMONIA in the last 8760 hours. CBC:  Recent Labs  10/13/14 0817  WBC 7.5  NEUTROABS 4.0  HGB 16.2  HCT 49.1  MCV 94   Lipid Panel:  Recent Labs  02/01/15 0917 05/04/15 0812 08/04/15 0854  CHOL 130 139 152  HDL 29* 36* 34*  LDLCALC 59 70 69  TRIG 212* 166* 244*  CHOLHDL 4.5 3.9 4.5   Lab Results  Component Value Date   HGBA1C 6.1* 08/04/2015    Assessment/Plan 1. Essential hypertension, benign -bp was actually good today when not smoking and with a higher dose of effexor - Comprehensive metabolic panel; Future  2. Insomnia -still problematic for him -on effexor in days -due to his PTSD, tobacco abuse history, I'm hesitant to put him on any sedative/hypnotics or benzos which might become habitual for him and cause more side effects -I suspect he might sleep better when he's been off cigarettes for longer  3. Tobacco use disorder -has quit a week ago cold turkey--hoping this will last -strangely he's managed to do this in the midst of his wife having been in a MVA and he's been helping care for her  4. Hyperglycemia - encouraged some exercise and continuing to cut back on starchy carbs as this increased again on last check - Comprehensive metabolic panel; Future - Hemoglobin A1c; Future  5.  Hypertriglyceridemia - increased again b/c his wife is back from [place] and has been cooking more rice (not the taters this time) - Comprehensive metabolic panel; Future - Hemoglobin A1c; Future - Lipid panel; Future  6. Seasonal allergies -requested renewal of eye drops - olopatadine (PATANOL) 0.1 % ophthalmic solution; Place 1 drop into both eyes 2 (two) times daily. Antihistamine  Dispense: 5 mL; Refill: 5  7. Depression - stable lately, able to quit smoking a week ago and doing well with that -tolerating higher dosage of effexor - venlafaxine XR (EFFEXOR-XR) 75 MG 24 hr capsule; Take 1 capsule (75 mg total) by mouth daily with breakfast.  Dispense: 90 capsule; Refill: 3  8. Need for hepatitis C screening test -requested screening after seeing commercial--can only hope his insurance will now cover it! - Hepatitis Acute Panel; Future  9. 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 Comprehensive metabolic panel    Standing Status: Future     Number of Occurrences:      Standing Expiration Date: 02/03/2016    Order Specific Question:  Has the patient fasted?    Answer:  Yes  . Hemoglobin A1c    Standing Status: Future     Number of Occurrences:      Standing Expiration Date: 02/03/2016  . Lipid panel    Standing Status: Future     Number of Occurrences:      Standing Expiration Date: 02/03/2016    Order Specific Question:  Has the patient fasted?    Answer:  Yes  . Hepatitis Acute Panel    Standing Status: Future     Number of Occurrences:      Standing Expiration Date: 02/03/2016  Next appt:  11/08/2015 for annual with labs before  [place]. Trevious Rampey, D.O. [facility] [address], [place] [address] Cell Phone (Mon-Fri 8am-5pm):  [phone] On Call:  [phone] & follow  prompts after 5pm & weekends Office Phone:  [phone] Office Fax:  [phone]

## 2015-08-08 ENCOUNTER — Encounter: Payer: Self-pay | Admitting: Internal Medicine

## 2015-09-13 DIAGNOSIS — H2513 Age-related nuclear cataract, bilateral: Secondary | ICD-10-CM | POA: Diagnosis not present

## 2015-09-13 DIAGNOSIS — Z0100 Encounter for examination of eyes and vision without abnormal findings: Secondary | ICD-10-CM | POA: Diagnosis not present

## 2015-11-03 ENCOUNTER — Other Ambulatory Visit: Payer: Medicare Other

## 2015-11-03 DIAGNOSIS — R739 Hyperglycemia, unspecified: Secondary | ICD-10-CM

## 2015-11-03 DIAGNOSIS — I10 Essential (primary) hypertension: Secondary | ICD-10-CM

## 2015-11-03 DIAGNOSIS — E781 Pure hyperglyceridemia: Secondary | ICD-10-CM | POA: Diagnosis not present

## 2015-11-03 DIAGNOSIS — Z1159 Encounter for screening for other viral diseases: Secondary | ICD-10-CM | POA: Diagnosis not present

## 2015-11-04 ENCOUNTER — Encounter: Payer: Self-pay | Admitting: *Deleted

## 2015-11-04 LAB — LIPID PANEL
Chol/HDL Ratio: 4.4 ratio units (ref 0.0–5.0)
Cholesterol, Total: 136 mg/dL (ref 100–199)
HDL: 31 mg/dL — ABNORMAL LOW (ref >39)
LDL Calculated: 45 mg/dL (ref 0–99)
Triglycerides: 301 mg/dL — ABNORMAL HIGH (ref 0–149)
VLDL Cholesterol Cal: 60 mg/dL — ABNORMAL HIGH (ref 5–40)

## 2015-11-04 LAB — COMPREHENSIVE METABOLIC PANEL
ALT: 15 IU/L (ref 0–44)
AST: 19 IU/L (ref 0–40)
Albumin/Globulin Ratio: 1.6 (ref 1.2–2.2)
Albumin: 4.4 g/dL (ref 3.5–4.8)
Alkaline Phosphatase: 68 IU/L (ref 39–117)
BUN/Creatinine Ratio: 23 (ref 10–24)
BUN: 16 mg/dL (ref 8–27)
Bilirubin Total: 0.4 mg/dL (ref 0.0–1.2)
CO2: 20 mmol/L (ref 18–29)
Calcium: 9.3 mg/dL (ref 8.6–10.2)
Chloride: 100 mmol/L (ref 96–106)
Creatinine, Ser: 0.69 mg/dL — ABNORMAL LOW (ref 0.76–1.27)
GFR calc Af Amer: 111 mL/min/{1.73_m2} (ref >59)
GFR calc non Af Amer: 96 mL/min/{1.73_m2} (ref >59)
Globulin, Total: 2.7 g/dL (ref 1.5–4.5)
Glucose: 114 mg/dL — ABNORMAL HIGH (ref 65–99)
Potassium: 4.9 mmol/L (ref 3.5–5.2)
Sodium: 138 mmol/L (ref 134–144)
Total Protein: 7.1 g/dL (ref 6.0–8.5)

## 2015-11-04 LAB — HEPATITIS PANEL, ACUTE
Hep A IgM: NEGATIVE
Hep B C IgM: NEGATIVE
Hep C Virus Ab: <0.1 s/co ratio (ref 0.0–0.9)
Hepatitis B Surface Ag: NEGATIVE

## 2015-11-04 LAB — HEMOGLOBIN A1C
Est. average glucose Bld gHb Est-mCnc: 131 mg/dL
Hgb A1c MFr Bld: 6.2 % — ABNORMAL HIGH (ref 4.8–5.6)

## 2015-11-08 ENCOUNTER — Encounter: Payer: Self-pay | Admitting: Internal Medicine

## 2015-11-08 ENCOUNTER — Ambulatory Visit (INDEPENDENT_AMBULATORY_CARE_PROVIDER_SITE_OTHER): Payer: Medicare Other | Admitting: Internal Medicine

## 2015-11-08 VITALS — BP 148/78 | HR 78 | Temp 98.1°F | Ht 70.0 in | Wt 229.0 lb

## 2015-11-08 DIAGNOSIS — J302 Other seasonal allergic rhinitis: Secondary | ICD-10-CM | POA: Insufficient documentation

## 2015-11-08 DIAGNOSIS — F329 Major depressive disorder, single episode, unspecified: Secondary | ICD-10-CM

## 2015-11-08 DIAGNOSIS — R739 Hyperglycemia, unspecified: Secondary | ICD-10-CM | POA: Diagnosis not present

## 2015-11-08 DIAGNOSIS — R1110 Vomiting, unspecified: Secondary | ICD-10-CM | POA: Diagnosis not present

## 2015-11-08 DIAGNOSIS — G4700 Insomnia, unspecified: Secondary | ICD-10-CM

## 2015-11-08 DIAGNOSIS — Z716 Tobacco abuse counseling: Secondary | ICD-10-CM

## 2015-11-08 DIAGNOSIS — E781 Pure hyperglyceridemia: Secondary | ICD-10-CM | POA: Diagnosis not present

## 2015-11-08 DIAGNOSIS — Z720 Tobacco use: Secondary | ICD-10-CM

## 2015-11-08 DIAGNOSIS — Z91048 Other nonmedicinal substance allergy status: Secondary | ICD-10-CM

## 2015-11-08 DIAGNOSIS — I10 Essential (primary) hypertension: Secondary | ICD-10-CM | POA: Diagnosis not present

## 2015-11-08 DIAGNOSIS — Z0000 Encounter for general adult medical examination without abnormal findings: Secondary | ICD-10-CM

## 2015-11-08 DIAGNOSIS — Z9109 Other allergy status, other than to drugs and biological substances: Secondary | ICD-10-CM | POA: Insufficient documentation

## 2015-11-08 DIAGNOSIS — F32A Depression, unspecified: Secondary | ICD-10-CM

## 2015-11-08 MED ORDER — LORATADINE 10 MG PO TABS: 10.0000 mg | ORAL_TABLET | Freq: Every day | ORAL | Status: DC

## 2015-11-08 MED ORDER — VENLAFAXINE HCL ER 75 MG PO CP24: 75.0000 mg | ORAL_CAPSULE | Freq: Every day | ORAL | Status: DC

## 2015-11-08 MED ORDER — POLYETHYL GLYCOL-PROPYL GLYCOL 0.4-0.3 % OP SOLN: 1.0000 [drp] | OPHTHALMIC | Status: DC | PRN

## 2015-11-08 MED ORDER — PATADAY 0.2 % OP SOLN: 1.0000 [drp] | Freq: Every day | OPHTHALMIC | Status: DC | PRN

## 2015-11-08 MED ORDER — MELATONIN 10 MG PO CAPS: 10.0000 mg | ORAL_CAPSULE | Freq: Every evening | ORAL | Status: DC | PRN

## 2015-11-08 MED ORDER — SIMVASTATIN 20 MG PO TABS: ORAL_TABLET | ORAL | Status: DC

## 2015-11-08 NOTE — Progress Notes (Signed)
Patient ID: David [name], male   DOB: 27-Aug-1945, 70 y.o.   MRN: [id number]  MMSE 28/30 passed clock  drawing

## 2015-11-08 NOTE — Progress Notes (Signed)
Patient ID: David [name], male   DOB: 1946/06/05, 70 y.o.   MRN: [id number]   Location:  [facility] clinic Provider: Merril Nagy L. Mariea Clonts, D.O., C.M.D.  Patient Care Team: Gayland Curry, DO as PCP - General (Geriatric Medicine) Katy Apo, MD as Consulting Physician (Ophthalmology)  Extended Emergency Contact Information Primary Emergency Contact: [place] Address: [address]          [place] L[place] Home Phone: [phone] Relation: None  Code Status: full code Goals of Care: Advanced Directive information Advanced Directives 11/08/2015  Does patient have an advance directive? No  Would patient like information on creating an advanced directive? No - patient declined information  Has been discussed on several occasions, but he has still not completed this.  Chief Complaint  Patient presents with  . Annual Exam    wellness exam  . MMSE    28/30 passed clock drawing    HPI: Patient is a 70 y.o. male seen in today for an annual wellness exam.    He's been nauseous and vomiting the past two days.  Last meal was last night.  That stayed down.    Has a big bug bite on his right thigh.  Depression screen [facility] 2/9 11/08/2015 05/06/2015 04/10/2014 09/11/2013 06/13/2013  Decreased Interest 0 0 0 0 0  Down, Depressed, Hopeless 0 0 0 0 0  PHQ - 2 Score 0 0 0 0 0    Fall Risk  11/08/2015 05/06/2015 02/01/2015 11/05/2014 10/15/2014  Falls in the past year? No No No No No   MMSE - Mini Mental State Exam 11/08/2015 04/10/2014  Orientation to time 5 5  Orientation to Place 5 5  Registration 3 3  Attention/ Calculation 5 5  Recall 1 1  Language- name 2 objects 2 2  Language- repeat 1 1  Language- follow 3 step command 3 3  Language- read & follow direction 1 1  Write a sentence 1 1  Copy design 1 1  Total score 28 28  Passed clock drawing  Health Maintenance  Topic Date Due  . INFLUENZA VACCINE  02/08/2016  . COLONOSCOPY  08/31/2020  . TETANUS/TDAP  06/14/2023  . ZOSTAVAX   Completed  . Hepatitis C Screening  Completed  . PNA vac Low Risk Adult  Completed   Urinary incontinence?  No difficulty.  More frequency as drinking more water.    Functional Status Survey: Is the patient deaf or have difficulty hearing?: No Does the patient have difficulty seeing, even when wearing glasses/contacts?: No Does the patient have difficulty concentrating, remembering, or making decisions?: No Does the patient have difficulty walking or climbing stairs?: No Does the patient have difficulty dressing or bathing?: No Does the patient have difficulty doing errands alone such as visiting a doctor's office or shopping?: No Exercise?  Mows the yard.  Counseled on walking.   Diet?  Has been eating more.  Is drinking more water.  Has been getting in the rice instead of taters.   Vision Screening Comments: Feb 2017, Dr. Katy Apo  Hearing:  Hearing ok today. Dentition:  Missing several teeth.   Pain:  Back still acting up.  Once in a while down his left leg; mostly in the small of his back when he sweeps and mops the house.  Has to stop halfway through and go back.  Same with the yard.  Has quit smoking since January.  His appetite has increased immensely.  Has gained 22 lbs since his last  visit 1/27.    Past Medical History  Diagnosis Date  . Atherosclerosis of native arteries of the extremities, unspecified   . Myalgia and myositis, unspecified   . Unspecified hypertrophic and atrophic condition of skin   . Hyperpotassemia   . Nocturia   . Rotator cuff (capsule) sprain   . Anxiety state, unspecified   . Contact dermatitis and other eczema due to other chemical products   . Insomnia, unspecified   . Herpes zoster with unspecified complication   . Lumbago   . Lumbago   . Pure hyperglyceridemia   . Other abnormal blood chemistry   . Obesity, unspecified   . Tobacco use disorder   . Other headache syndromes(339.89)   . Unspecified essential hypertension   . Asymptomatic  varicose veins   . Hemorrhoids, external   . Allergic rhinitis due to pollen   . 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 Osteoarthrosis, unspecified whether generalized or localized, unspecified site   . Unspecified sleep apnea   . Rash and other nonspecific skin eruption   . Headache(784.0)   . Flatulence, eructation, and gas pain   . Routine general medical examination at a health care facility     Past Surgical History  Procedure Laterality Date  . Hemorrhoid surgery    . Varicose vein surgery      Social History   Social History  . Marital Status: Married    Spouse Name: N/A  . Number of Children: N/A  . Years of Education: N/A   Occupational History  . Not on file.   Social History Main Topics  . Smoking status: Former Smoker -- 1.00 packs/day for 55 years    Types: Cigarettes    Quit date: 07/30/2015  . Smokeless tobacco: Never Used     Comment: pt thinks increased effexor dose helped him to quit  . Alcohol Use: No  . Drug Use: No  . Sexual Activity: Not on file   Other Topics Concern  . Not on file   Social History Narrative   Married   Stopped smoking 07/30/15   Alcohol none   Exercise none   [employer] [id number]     Allergies  Allergen Reactions  . Celebrex [Celecoxib]   . Erythromycin   . Oxycontin [Oxycodone Hcl]   . Penicillins   . Tetracyclines & Related       Medication List       This list is accurate as of: 11/08/15  9:39 AM.  Always use your most recent med list.               loratadine 10 MG tablet  Commonly known as:  CLARITIN  Take 10 mg by mouth daily.     Melatonin 10 MG Caps  Take 10 mg by mouth at bedtime as needed (insomnia).     PATADAY 0.2 % Soln  Generic drug:  Olopatadine HCl  One drop both eyes     olopatadine 0.1 % ophthalmic solution  Commonly known as:  PATANOL  Place 1 drop into both eyes 2 (two) times daily. Antihistamine     Polyethyl Glycol-Propyl Glycol 0.4-0.3 % Soln    Commonly known as:  SYSTANE  Place 1-2 drops into both eyes as needed. Used for dry eyes     simvastatin 20 MG tablet  Commonly known as:  ZOCOR  TAKE 1 TABLET BY MOUTH EVERY DAY AT 6PM     venlafaxine XR 75 MG  24 hr capsule  Commonly known as:  EFFEXOR-XR  Take 1 capsule (75 mg total) by mouth daily with breakfast.       Review of Systems:  Review of Systems  Constitutional: Negative for fever, chills and malaise/fatigue.  HENT: Negative for hearing loss.   Eyes: Negative for blurred vision.       Saw Dr. Prudencio Burly in Feb; may need cataract surgery in 6 mos  Respiratory: Negative for cough and shortness of breath.   Cardiovascular: Negative for chest pain and leg swelling.  Gastrointestinal:       Just got over n/v for 2 days  Genitourinary: Negative for dysuria and urgency.  Musculoskeletal: Positive for back pain. Negative for falls.  Neurological: Positive for tingling. Negative for weakness and headaches.  Psychiatric/Behavioral: Negative for depression and memory loss. The patient is not nervous/anxious.        Poor sleep with stomach upset    Physical Exam: Filed Vitals:   11/08/15 0901  BP: 148/78  Pulse: 78  Temp: 98.1 F (36.7 C)  TempSrc: Oral  Height: 5\' 10"  (1.778 m)  Weight: 229 lb (103.874 kg)  SpO2: 97%   Body mass index is 32.86 kg/(m^2). Physical Exam  Constitutional: He is oriented to person, place, and time. He appears well-developed and well-nourished. No distress.  Eyes:  Left eye lazy; needs cataract surgery soon  Cardiovascular: Normal rate, regular rhythm, normal heart sounds and intact distal pulses.   Pulmonary/Chest: Effort normal and breath sounds normal. No respiratory distress.  Abdominal: Soft. Bowel sounds are normal. He exhibits no distension. There is no tenderness.  Musculoskeletal: Normal range of motion. He exhibits no edema or tenderness.  No localized tenderness  Neurological: He is alert and oriented to person, place, and  time.  Skin: Skin is warm and dry.  Papule on right thigh  Psychiatric: He has a normal mood and affect.    Labs reviewed: Basic Metabolic Panel:  Recent Labs  05/04/15 0812 08/04/15 0854 11/03/15 0835  NA 138 142 138  K 4.3 5.7* 4.9  CL 97 102 100  CO2 24 24 20   GLUCOSE 108* 112* 114*  BUN 13 15 16   CREATININE 0.65* 0.74* 0.69*  CALCIUM 9.7 9.7 9.3   Liver Function Tests:  Recent Labs  02/01/15 0917 11/03/15 0835  AST 14 19  ALT 12 15  ALKPHOS 73 68  BILITOT 0.4 0.4  PROT 6.7 7.1  ALBUMIN 4.1 4.4   No results for input(s): LIPASE, AMYLASE in the last 8760 hours. No results for input(s): AMMONIA in the last 8760 hours. CBC: No results for input(s): WBC, NEUTROABS, HGB, HCT, MCV, PLT in the last 8760 hours. Lipid Panel:  Recent Labs  05/04/15 0812 08/04/15 0854 11/03/15 0835  CHOL 139 152 136  HDL 36* 34* 31*  LDLCALC 70 69 45  TRIG 166* 244* 301*  CHOLHDL 3.9 4.5 4.4   Lab Results  Component Value Date   HGBA1C 6.2* 11/03/2015    EKG today:  NSR, abn left axis deviation and left anterior fascicular block  Assessment/Plan 1. Medicare annual wellness visit, subsequent -see hpi, iup to date  2. Essential hypertension, benign - bp elevated but seems to have some white coat htn - EKG 12-Lead  3. Insomnia -really needs OSA treatment, but refuses  4. Encounter for smoking cessation counseling -is doing great here--off cigarettes since end of January--encourage him as he has noticed some great benefits  5. Hyperglycemia -discussed portion sizes especially for starchy  foods (rice and taters) that he loves, increased veggies  6. Hypertriglyceridemia -associated with #5 -both have trended up since he has quit smoking -advised on exercise and dietary changes (above)  Labs/tests ordered:   Orders Placed This Encounter  Procedures  . CBC with Differential/Platelet    Standing Status: Future     Number of Occurrences:      Standing Expiration  Date: 05/10/2016  . Comprehensive metabolic panel    Standing Status: Future     Number of Occurrences:      Standing Expiration Date: 05/10/2016    Order Specific Question:  Has the patient fasted?    Answer:  Yes  . Hemoglobin A1c    Standing Status: Future     Number of Occurrences:      Standing Expiration Date: 05/10/2016  . Lipid panel    Standing Status: Future     Number of Occurrences:      Standing Expiration Date: 05/10/2016    Order Specific Question:  Has the patient fasted?    Answer:  Yes  . EKG 12-Lead    Next appt:  02/10/2016 for med mgt, labs before   Aquilla Shambley L. Akasia Ahmad, D.O. [facility] [address], [place] [address] Cell Phone (Mon-Fri 8am-5pm):  [phone] On Call:  [phone] & follow prompts after 5pm & weekends Office Phone:  [phone] Office Fax:  [phone]

## 2015-11-08 NOTE — Patient Instructions (Signed)
GREAT JOB QUITTING SMOKING!  KEEP UP THE GOOD WORK!  I'M SO GLAD YOU FEEL BETTER!

## 2016-02-01 NOTE — Addendum Note (Signed)
Addended by: Moshe Cipro MESHELL A on: 02/01/2016 03:53 PM   Modules accepted: Orders

## 2016-02-07 ENCOUNTER — Other Ambulatory Visit: Payer: Self-pay | Admitting: Internal Medicine

## 2016-02-07 ENCOUNTER — Other Ambulatory Visit: Payer: Medicare Other

## 2016-02-07 ENCOUNTER — Ambulatory Visit: Payer: Self-pay | Admitting: Nurse Practitioner

## 2016-02-07 DIAGNOSIS — E781 Pure hyperglyceridemia: Secondary | ICD-10-CM | POA: Diagnosis not present

## 2016-02-07 DIAGNOSIS — I10 Essential (primary) hypertension: Secondary | ICD-10-CM

## 2016-02-07 DIAGNOSIS — R739 Hyperglycemia, unspecified: Secondary | ICD-10-CM

## 2016-02-07 DIAGNOSIS — T63441A Toxic effect of venom of bees, accidental (unintentional), initial encounter: Secondary | ICD-10-CM

## 2016-02-07 LAB — HEMOGLOBIN A1C
Hgb A1c MFr Bld: 5.9 % — ABNORMAL HIGH (ref <5.7)
Mean Plasma Glucose: 123 mg/dL

## 2016-02-07 LAB — CBC WITH DIFFERENTIAL/PLATELET
Basophils Absolute: 79 cells/uL (ref 0–200)
Basophils Relative: 1 %
Eosinophils Absolute: 158 cells/uL (ref 15–500)
Eosinophils Relative: 2 %
HCT: 45 % (ref 38.5–50.0)
Hemoglobin: 15.1 g/dL (ref 13.2–17.1)
Lymphocytes Relative: 30 %
Lymphs Abs: 2370 cells/uL (ref 850–3900)
MCH: 30.9 pg (ref 27.0–33.0)
MCHC: 33.6 g/dL (ref 32.0–36.0)
MCV: 92 fL (ref 80.0–100.0)
MPV: 11.6 fL (ref 7.5–12.5)
Monocytes Absolute: 948 cells/uL (ref 200–950)
Monocytes Relative: 12 %
Neutro Abs: 4345 cells/uL (ref 1500–7800)
Neutrophils Relative %: 55 %
Platelets: 231 10*3/uL (ref 140–400)
RBC: 4.89 MIL/uL (ref 4.20–5.80)
RDW: 13.4 % (ref 11.0–15.0)
WBC: 7.9 10*3/uL (ref 3.8–10.8)

## 2016-02-07 LAB — LIPID PANEL
Cholesterol: 144 mg/dL (ref 125–200)
HDL: 26 mg/dL — ABNORMAL LOW (ref >=40)
LDL Cholesterol: 41 mg/dL (ref <130)
Total CHOL/HDL Ratio: 5.5 Ratio — ABNORMAL HIGH (ref <=5.0)
Triglycerides: 387 mg/dL — ABNORMAL HIGH (ref <150)
VLDL: 77 mg/dL — ABNORMAL HIGH (ref <30)

## 2016-02-07 LAB — COMPREHENSIVE METABOLIC PANEL
ALT: 15 U/L (ref 9–46)
AST: 16 U/L (ref 10–35)
Albumin: 4 g/dL (ref 3.6–5.1)
Alkaline Phosphatase: 61 U/L (ref 40–115)
BUN: 14 mg/dL (ref 7–25)
CO2: 24 mmol/L (ref 20–31)
Calcium: 9.4 mg/dL (ref 8.6–10.3)
Chloride: 104 mmol/L (ref 98–110)
Creat: 0.76 mg/dL (ref 0.70–1.18)
Glucose, Bld: 113 mg/dL — ABNORMAL HIGH (ref 65–99)
Potassium: 4.6 mmol/L (ref 3.5–5.3)
Sodium: 138 mmol/L (ref 135–146)
Total Bilirubin: 0.3 mg/dL (ref 0.2–1.2)
Total Protein: 6.7 g/dL (ref 6.1–8.1)

## 2016-02-07 MED ORDER — HYDROXYZINE HCL 10 MG PO TABS: 10.0000 mg | ORAL_TABLET | Freq: Three times a day (TID) | ORAL | 0 refills | Status: DC | PRN

## 2016-02-07 NOTE — Progress Notes (Signed)
Pt stopped by office to try to get an appt this am.  He stopped to talk to me in the hall and reported he was stung by a bee that was flying around his water glass.  He swatted at it and it stung him on the palmar aspect of his right forearm.  It's been red and swollen since it happened yesterday.  It stings a bit and itches like crazy.  He is taking his regular loratadine which has not helped.  His sister gave him 50mg  benadryl that he took last night, but it didn't help very much and he couldn't afford to buy more of any medication.

## 2016-02-10 ENCOUNTER — Encounter: Payer: Self-pay | Admitting: *Deleted

## 2016-02-10 ENCOUNTER — Ambulatory Visit (INDEPENDENT_AMBULATORY_CARE_PROVIDER_SITE_OTHER): Payer: Medicare Other | Admitting: Internal Medicine

## 2016-02-10 ENCOUNTER — Encounter: Payer: Self-pay | Admitting: Internal Medicine

## 2016-02-10 VITALS — BP 140/82 | HR 74 | Temp 98.0°F | Ht 70.0 in | Wt 234.0 lb

## 2016-02-10 DIAGNOSIS — F329 Major depressive disorder, single episode, unspecified: Secondary | ICD-10-CM

## 2016-02-10 DIAGNOSIS — I10 Essential (primary) hypertension: Secondary | ICD-10-CM | POA: Diagnosis not present

## 2016-02-10 DIAGNOSIS — E781 Pure hyperglyceridemia: Secondary | ICD-10-CM | POA: Diagnosis not present

## 2016-02-10 DIAGNOSIS — R739 Hyperglycemia, unspecified: Secondary | ICD-10-CM

## 2016-02-10 DIAGNOSIS — E669 Obesity, unspecified: Secondary | ICD-10-CM

## 2016-02-10 DIAGNOSIS — F411 Generalized anxiety disorder: Secondary | ICD-10-CM

## 2016-02-10 DIAGNOSIS — F32A Depression, unspecified: Secondary | ICD-10-CM

## 2016-02-10 MED ORDER — VENLAFAXINE HCL ER 37.5 MG PO CP24: 37.5000 mg | ORAL_CAPSULE | Freq: Every day | ORAL | 0 refills | Status: DC

## 2016-02-10 NOTE — Progress Notes (Signed)
Location:  [facility] clinic Provider:  Klint Lezcano L. Mariea Clonts, D.O., C.M.D.  Code Status: full code Goals of Care:  Advanced Directives 11/08/2015  Does patient have an advance directive? No  Would patient like information on creating an advanced directive? No - patient declined information  has not done  Chief Complaint  Patient presents with  . Medical Management of Chronic Issues    27mth follow-up    HPI: Patient is a 70 y.o. male seen today for medical management of chronic diseases.    Still has rash from wasp bite on right arm.  Still itchy even after starting the allergy medication, but improved now.  Swelling is improved.    BP elevated this am.    Out of eye drops again and can't afford to get more so eyes are watery.  Got state and county tax bill.    Nerves are good.  Taking his effexor.  Has stopped smoking again.    Sleep still not great.  Is out of melatonin.  Was out in 10 mins when he took the melatonin.    Past Medical History:  Diagnosis Date  . Allergic rhinitis due to pollen   . Anxiety state, unspecified   . Asymptomatic varicose veins   . Atherosclerosis of native arteries of the extremities, unspecified   . Contact dermatitis and other eczema due to other chemical products   . 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 Flatulence, eructation, and gas pain   . Headache(784.0)   . Hemorrhoids, external   . Herpes zoster with unspecified complication   . Hyperpotassemia   . Insomnia, unspecified   . Lumbago   . Lumbago   . Myalgia and myositis, unspecified   . Nocturia   . Obesity, unspecified   . Osteoarthrosis, unspecified whether generalized or localized, unspecified site   . Other abnormal blood chemistry   . Other headache syndromes(339.89)   . Pure hyperglyceridemia   . Rash and other nonspecific skin eruption   . Rotator cuff (capsule) sprain   . Routine general medical examination at a health care facility   . Tobacco use  disorder   . Unspecified essential hypertension   . Unspecified hypertrophic and atrophic condition of skin   . Unspecified sleep apnea     Past Surgical History:  Procedure Laterality Date  . HEMORRHOID SURGERY    . VARICOSE VEIN SURGERY      Allergies  Allergen Reactions  . Celebrex [Celecoxib]   . Erythromycin   . Oxycontin [Oxycodone Hcl]   . Penicillins   . Tetracyclines & Related       Medication List       Accurate as of 02/10/16 10:01 AM. Always use your most recent med list.          hydrOXYzine 10 MG tablet Commonly known as:  ATARAX/VISTARIL Take 1 tablet (10 mg total) by mouth 3 (three) times daily as needed for itching (from bee sting--use until resolved).   loratadine 10 MG tablet Commonly known as:  CLARITIN Take 1 tablet (10 mg total) by mouth daily.   Melatonin 10 MG Caps Take 10 mg by mouth at bedtime as needed (insomnia).   simvastatin 20 MG tablet Commonly known as:  ZOCOR TAKE 1 TABLET BY MOUTH EVERY DAY AT 6PM   venlafaxine XR 75 MG 24 hr capsule Commonly known as:  EFFEXOR-XR Take 1 capsule (75 mg total) by mouth daily with breakfast.  Review of Systems:  Review of Systems  Constitutional: Negative for chills and fever.       Wt gain since quit smoking  HENT: Positive for hearing loss.   Eyes: Positive for redness. Negative for blurred vision.       Itchy watery eyes  Respiratory: Negative for shortness of breath.   Cardiovascular: Negative for chest pain and leg swelling.  Gastrointestinal: Negative for abdominal pain and constipation.  Genitourinary: Positive for frequency. Negative for dysuria.  Musculoskeletal: Negative for back pain and falls.  Neurological: Negative for dizziness and loss of consciousness.  Psychiatric/Behavioral: Negative for depression and memory loss. The patient has insomnia. The patient is not nervous/anxious.     Health Maintenance  Topic Date Due  . INFLUENZA VACCINE  02/08/2016  .  COLONOSCOPY  08/31/2020  . TETANUS/TDAP  06/14/2023  . ZOSTAVAX  Completed  . Hepatitis C Screening  Completed  . PNA vac Low Risk Adult  Completed    Physical Exam: Vitals:   02/10/16 0953  BP: (!) 150/80  Pulse: 74  Temp: 98 F (36.7 C)  TempSrc: Oral  SpO2: 97%  Weight: 234 lb (106.1 kg)  Height: 5\' 10"  (1.778 m)   Body mass index is 33.58 kg/m. Physical Exam  Constitutional: He is oriented to person, place, and time. He appears well-developed and well-nourished. No distress.  Cardiovascular: Normal rate, regular rhythm, normal heart sounds and intact distal pulses.   Pulmonary/Chest: Effort normal and breath sounds normal. No respiratory distress.  Abdominal: Soft. Bowel sounds are normal.  Musculoskeletal: Normal range of motion.  Neurological: He is alert and oriented to person, place, and time.  Skin: Skin is warm and dry. Capillary refill takes less than 2 seconds.  Psychiatric: He has a normal mood and affect.    Labs reviewed: Basic Metabolic Panel:  Recent Labs  08/04/15 0854 11/03/15 0835 02/07/16 0836  NA 142 138 138  K 5.7* 4.9 4.6  CL 102 100 104  CO2 24 20 24   GLUCOSE 112* 114* 113*  BUN 15 16 14   CREATININE 0.74* 0.69* 0.76  CALCIUM 9.7 9.3 9.4   Liver Function Tests:  Recent Labs  11/03/15 0835 02/07/16 0836  AST 19 16  ALT 15 15  ALKPHOS 68 61  BILITOT 0.4 0.3  PROT 7.1 6.7  ALBUMIN 4.4 4.0   No results for input(s): LIPASE, AMYLASE in the last 8760 hours. No results for input(s): AMMONIA in the last 8760 hours. CBC:  Recent Labs  02/07/16 0836  WBC 7.9  NEUTROABS 4,345  HGB 15.1  HCT 45.0  MCV 92.0  PLT 231   Lipid Panel:  Recent Labs  08/04/15 0854 11/03/15 0835 02/07/16 0836  CHOL 152 136 144  HDL 34* 31* 26*  LDLCALC 69 45 41  TRIG 244* 301* 387*  CHOLHDL 4.5 4.4 5.5*   Lab Results  Component Value Date   HGBA1C 5.9 (H) 02/07/2016    Assessment/Plan 1. Depression - improved, wants to try going off  medicine now so will taper down - venlafaxine XR (EFFEXOR-XR) 37.5 MG 24 hr capsule; Take 1 capsule (37.5 mg total) by mouth daily with breakfast.  Dispense: 30 capsule; Refill: 0  2. Essential hypertension, benign -bp elevated this am--had not smoked and not on meds -if remains so next time, start medication -encouraged regular walking 20 mins per day to help with this, lipids, wt, glucose  3. Obesity (BMI 30-39.9) -encouraged exercise, has somewhat improved his diet and hba1c better, lipids  still not to goal  4. Hypertriglyceridemia -not yet to goal, eating too much rice and taters - Lipid panel; Future - Hemoglobin A1c; Future  5. Generalized anxiety disorder -improved--feels much better these days and wants to try to go down on effexor and off (will decrease to 37.5 for one month, then stop), but he is to call immediately if he has difficulty with the taper or stopping so it can be resumed b/c he has done very well on it (outside of his bp which could be due to it)  6. Hyperglycemia - cont to work on diet and start regular walking for exercise - Hemoglobin A1c; Future  Labs/tests ordered:   Orders Placed This Encounter  Procedures  . Lipid panel    Standing Status:   Future    Standing Expiration Date:   08/12/2016    Order Specific Question:   Has the patient fasted?    Answer:   Yes  . Hemoglobin A1c    Standing Status:   Future    Standing Expiration Date:   08/12/2016   Next appt:  05/12/2016 med mgt, labs before   Yazleemar Strassner L. Brock Mokry, D.O. [facility] [address], [place] [address] Cell Phone (Mon-Fri 8am-5pm):  [phone] On Call:  [phone] & follow prompts after 5pm & weekends Office Phone:  [phone] Office Fax:  [phone]

## 2016-02-10 NOTE — Patient Instructions (Addendum)
Let's taper down your effexor (venlafaxine) to 37.5mg  for a month, then stop. If you become more anxious or jittery or depressed, call me and we will increase the dose back up or restart.    I'm proud of you that you are not smoking.  Keep up the good work!  Please start walking for 20 minutes every day.  I don't care what time of day your do it or if you go to a mall and walk around.    You may use cortisone 10 on your right arm up to three times a day for itching.

## 2016-03-19 ENCOUNTER — Other Ambulatory Visit: Payer: Self-pay | Admitting: Internal Medicine

## 2016-03-19 DIAGNOSIS — F32A Depression, unspecified: Secondary | ICD-10-CM

## 2016-03-19 DIAGNOSIS — F329 Major depressive disorder, single episode, unspecified: Secondary | ICD-10-CM

## 2016-05-02 DIAGNOSIS — H1045 Other chronic allergic conjunctivitis: Secondary | ICD-10-CM | POA: Diagnosis not present

## 2016-05-02 DIAGNOSIS — J309 Allergic rhinitis, unspecified: Secondary | ICD-10-CM | POA: Diagnosis not present

## 2016-05-08 ENCOUNTER — Other Ambulatory Visit: Payer: Medicare Other

## 2016-05-08 DIAGNOSIS — E781 Pure hyperglyceridemia: Secondary | ICD-10-CM | POA: Diagnosis not present

## 2016-05-08 DIAGNOSIS — R739 Hyperglycemia, unspecified: Secondary | ICD-10-CM

## 2016-05-08 LAB — LIPID PANEL
Cholesterol: 126 mg/dL (ref 125–200)
HDL: 24 mg/dL — ABNORMAL LOW (ref >=40)
LDL Cholesterol: 46 mg/dL (ref <130)
Total CHOL/HDL Ratio: 5.3 Ratio — ABNORMAL HIGH (ref <=5.0)
Triglycerides: 281 mg/dL — ABNORMAL HIGH (ref <150)
VLDL: 56 mg/dL — ABNORMAL HIGH (ref <30)

## 2016-05-09 LAB — HEMOGLOBIN A1C
Hgb A1c MFr Bld: 5.8 % — ABNORMAL HIGH (ref <5.7)
Mean Plasma Glucose: 120 mg/dL

## 2016-05-12 ENCOUNTER — Ambulatory Visit (INDEPENDENT_AMBULATORY_CARE_PROVIDER_SITE_OTHER): Payer: Medicare Other | Admitting: Internal Medicine

## 2016-05-12 ENCOUNTER — Encounter: Payer: Self-pay | Admitting: Internal Medicine

## 2016-05-12 VITALS — BP 138/80 | HR 83 | Temp 98.0°F | Wt 234.0 lb

## 2016-05-12 DIAGNOSIS — I10 Essential (primary) hypertension: Secondary | ICD-10-CM

## 2016-05-12 DIAGNOSIS — E669 Obesity, unspecified: Secondary | ICD-10-CM | POA: Diagnosis not present

## 2016-05-12 DIAGNOSIS — Z23 Encounter for immunization: Secondary | ICD-10-CM | POA: Diagnosis not present

## 2016-05-12 DIAGNOSIS — F5101 Primary insomnia: Secondary | ICD-10-CM | POA: Diagnosis not present

## 2016-05-12 DIAGNOSIS — R739 Hyperglycemia, unspecified: Secondary | ICD-10-CM

## 2016-05-12 DIAGNOSIS — H1045 Other chronic allergic conjunctivitis: Secondary | ICD-10-CM

## 2016-05-12 DIAGNOSIS — L819 Disorder of pigmentation, unspecified: Secondary | ICD-10-CM | POA: Diagnosis not present

## 2016-05-12 DIAGNOSIS — E781 Pure hyperglyceridemia: Secondary | ICD-10-CM | POA: Diagnosis not present

## 2016-05-12 MED ORDER — MELATONIN 5 MG PO CAPS: 1.0000 | ORAL_CAPSULE | Freq: Every evening | ORAL | 3 refills | Status: DC | PRN

## 2016-05-12 NOTE — Patient Instructions (Signed)
Please begin an exercise program of walking 15 minutes each day.  Gradually increase to 45 minutes per day.

## 2016-05-12 NOTE — Progress Notes (Signed)
Location:  [facility] clinic Provider:  Teofil Maniaci L. Mariea Clonts, D.O., C.M.D.  Code Status: full code Goals of Care:  Advanced Directives 05/12/2016  Does patient have an advance directive? No  Would patient like information on creating an advanced directive? No - patient declined information    Chief Complaint  Patient presents with  . Medical Management of Chronic Issues    28mth follow-up    HPI: Patient is a 70 y.o. [name] seen today for medical management of chronic diseases.    Weight is stable.   He remains on zocor/simvastatin for his hyperlipidemia and TG improved. Hyperglycemia:  Hba1c is improved.   Influenza shot given today. Has some blood under his right great toenail laterally.  Looks like a contusion. Left arm with growing scaly area, not itchy or painful, is hyperpigmented. Has not been using the melatonin and not sleeping well now at all.  He is going to go back to taking it.    Past Medical History:  Diagnosis Date  . Allergic rhinitis due to pollen   . Anxiety state, unspecified   . Asymptomatic varicose veins   . Atherosclerosis of native arteries of the extremities, unspecified   . Contact dermatitis and other eczema due to other chemical products   . 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 Flatulence, eructation, and gas pain   . Headache(784.0)   . Hemorrhoids, external   . Herpes zoster with unspecified complication   . Hyperpotassemia   . Insomnia, unspecified   . Lumbago   . Lumbago   . Myalgia and myositis, unspecified   . Nocturia   . Obesity, unspecified   . Osteoarthrosis, unspecified whether generalized or localized, unspecified site   . Other abnormal blood chemistry   . Other headache syndromes(339.89)   . Pure hyperglyceridemia   . Rash and other nonspecific skin eruption   . Rotator cuff (capsule) sprain   . Routine general medical examination at a health care facility   . Tobacco use disorder   . Unspecified  essential hypertension   . Unspecified hypertrophic and atrophic condition of skin   . Unspecified sleep apnea     Past Surgical History:  Procedure Laterality Date  . HEMORRHOID SURGERY    . VARICOSE VEIN SURGERY      Allergies  Allergen Reactions  . Celebrex [Celecoxib]   . Erythromycin   . Oxycontin [Oxycodone Hcl]   . Penicillins   . Tetracyclines & Related       Medication List       Accurate as of 05/12/16  8:40 AM. Always use your most recent med list.          loratadine 10 MG tablet Commonly known as:  CLARITIN Take 1 tablet (10 mg total) by mouth daily.   simvastatin 20 MG tablet Commonly known as:  ZOCOR TAKE 1 TABLET BY MOUTH EVERY DAY AT 6PM   venlafaxine XR 37.5 MG 24 hr capsule Commonly known as:  EFFEXOR-XR TAKE 1 CAPSULE(37.5 MG) BY MOUTH DAILY WITH BREAKFAST       Review of Systems:  Review of Systems  Constitutional: Negative for chills, fever and malaise/fatigue.  HENT: Positive for hearing loss.   Eyes: Positive for discharge and redness. Negative for blurred vision.  Respiratory: Negative for shortness of breath.   Cardiovascular: Negative for chest pain and palpitations.  Gastrointestinal: Negative for abdominal pain, blood in stool, constipation and melena.  Genitourinary: Positive for frequency. Negative for dysuria and  urgency.  Musculoskeletal: Negative for falls.  Skin:       Skin places  Neurological: Negative for dizziness and weakness.  Endo/Heme/Allergies: Bruises/bleeds easily.  Psychiatric/Behavioral: Negative for depression and memory loss. The patient is nervous/anxious and has insomnia.     Health Maintenance  Topic Date Due  . INFLUENZA VACCINE  02/08/2016  . COLONOSCOPY  08/31/2020  . TETANUS/TDAP  06/14/2023  . ZOSTAVAX  Completed  . Hepatitis C Screening  Completed  . PNA vac Low Risk Adult  Completed    Physical Exam: Vitals:   05/12/16 0826  BP: 138/80  Pulse: 83  Temp: 98 F (36.7 C)  TempSrc:  Oral  SpO2: 98%  Weight: 234 lb (106.1 kg)   Body mass index is 33.58 kg/m. Physical Exam  Constitutional: He is oriented to person, place, and time. He appears well-developed and well-nourished. No distress.  Cardiovascular: Normal rate, regular rhythm, normal heart sounds and intact distal pulses.   Pulmonary/Chest: Effort normal and breath sounds normal. No respiratory distress.  Abdominal: Soft. Bowel sounds are normal. He exhibits no distension.  Neurological: He is alert and oriented to person, place, and time.  Skin: Skin is warm and dry. Capillary refill takes less than 2 seconds.  Dry scaly patch on left arm with hyperpigmentation (pt reports it's growing); right great toenail with dark color beneath nail  Psychiatric: He has a normal mood and affect.    Labs reviewed: Basic Metabolic Panel:  Recent Labs  08/04/15 0854 11/03/15 0835 02/07/16 0836  NA 142 138 138  K 5.7* 4.9 4.6  CL 102 100 104  CO2 24 20 24   GLUCOSE 112* 114* 113*  BUN 15 16 14   CREATININE 0.74* 0.69* 0.76  CALCIUM 9.7 9.3 9.4   Liver Function Tests:  Recent Labs  11/03/15 0835 02/07/16 0836  AST 19 16  ALT 15 15  ALKPHOS 68 61  BILITOT 0.4 0.3  PROT 7.1 6.7  ALBUMIN 4.4 4.0   No results for input(s): LIPASE, AMYLASE in the last 8760 hours. No results for input(s): AMMONIA in the last 8760 hours. CBC:  Recent Labs  02/07/16 0836  WBC 7.9  NEUTROABS 4,345  HGB 15.1  HCT 45.0  MCV 92.0  PLT 231   Lipid Panel:  Recent Labs  11/03/15 0835 02/07/16 0836 05/08/16 0830  CHOL 136 144 126  HDL 31* 26* 24*  LDLCALC 45 41 46  TRIG 301* 387* 281*  CHOLHDL 4.4 5.5* 5.3*   Lab Results  Component Value Date   HGBA1C 5.8 (H) 05/08/2016    Assessment/Plan 1. Essential hypertension, benign -bp ok today, reports rushing and anxious this am, not on meds for this, is off the effexor and seems to be sweating less and less anxious to me  2. Obesity (BMI 30-39.9) -encouraged  exercise daily before next visit  3. Chronic allergic conjunctivitis -biggest problem is that he cannot afford the eye drops recommended by Dr. Janet Berlin using some of the systane and samples of pataday, but says he can't afford the zaditor or to buy the pataday -no using his flonase b/c it gags him; is taking loratadine  4. Hypertriglyceridemia -cont to work on diet and exercise, TG did improve, cont simvastatin  5. Primary insomnia - encouraged him to walk to help promote sleep at night and also use melatonin which did help him to sleep at night before - Melatonin 5 MG CAPS; Take 1 capsule (5 mg total) by mouth at bedtime as needed (insomnia).  Dispense: 30 capsule; Refill: 3  6. Hyperglycemia -hba1c trending down -encouraged an exercise program this time with daily walking  7. Pigmentation abnormality of skin - appears this could be a squamous cell on his left arm near elbow - Ambulatory referral to Dermatology  8. Need for immunization against influenza - Flu Vaccine QUAD 36+ mos PF IM (Fluarix & Fluzone Quad PF)  Labs/tests ordered:   Orders Placed This Encounter  Procedures  . Flu Vaccine QUAD 36+ mos PF IM (Fluarix & Fluzone Quad PF)  . CBC with Differential/Platelet    Standing Status:   Future    Standing Expiration Date:   11/09/2016  . COMPLETE METABOLIC PANEL WITH GFR    [facility]    Standing Status:   Future    Standing Expiration Date:   11/09/2016  . Hemoglobin A1c    Standing Status:   Future    Standing Expiration Date:   11/09/2016  . Lipid panel    Standing Status:   Future    Standing Expiration Date:   11/09/2016    Order Specific Question:   Has the patient fasted?    Answer:   Yes  . Ambulatory referral to Dermatology    Referral Priority:   Routine    Referral Type:   Consultation    Referral Reason:   Specialty Services Required    Requested Specialty:   Dermatology    Number of Visits Requested:   1    Next appt:  08/17/2016 med mgt with labs  before   [place]. Princesa Willig, D.O. [facility] [address], [place] [address] Cell Phone (Mon-Fri 8am-5pm):  [phone] On Call:  [phone] & follow prompts after 5pm & weekends Office Phone:  [phone] Office Fax:  [phone]

## 2016-06-06 DIAGNOSIS — D1801 Hemangioma of skin and subcutaneous tissue: Secondary | ICD-10-CM | POA: Diagnosis not present

## 2016-06-06 DIAGNOSIS — L821 Other seborrheic keratosis: Secondary | ICD-10-CM | POA: Diagnosis not present

## 2016-06-06 DIAGNOSIS — L570 Actinic keratosis: Secondary | ICD-10-CM | POA: Diagnosis not present

## 2016-06-06 DIAGNOSIS — D225 Melanocytic nevi of trunk: Secondary | ICD-10-CM | POA: Diagnosis not present

## 2016-06-06 DIAGNOSIS — L820 Inflamed seborrheic keratosis: Secondary | ICD-10-CM | POA: Diagnosis not present

## 2016-06-06 DIAGNOSIS — D0359 Melanoma in situ of other part of trunk: Secondary | ICD-10-CM | POA: Diagnosis not present

## 2016-06-06 DIAGNOSIS — D485 Neoplasm of uncertain behavior of skin: Secondary | ICD-10-CM | POA: Diagnosis not present

## 2016-06-06 DIAGNOSIS — L918 Other hypertrophic disorders of the skin: Secondary | ICD-10-CM | POA: Diagnosis not present

## 2016-06-08 DIAGNOSIS — L821 Other seborrheic keratosis: Secondary | ICD-10-CM | POA: Diagnosis not present

## 2016-06-15 DIAGNOSIS — D224 Melanocytic nevi of scalp and neck: Secondary | ICD-10-CM | POA: Diagnosis not present

## 2016-06-15 DIAGNOSIS — D485 Neoplasm of uncertain behavior of skin: Secondary | ICD-10-CM | POA: Diagnosis not present

## 2016-06-15 DIAGNOSIS — L570 Actinic keratosis: Secondary | ICD-10-CM | POA: Diagnosis not present

## 2016-06-23 DIAGNOSIS — F329 Major depressive disorder, single episode, unspecified: Secondary | ICD-10-CM | POA: Diagnosis not present

## 2016-06-23 DIAGNOSIS — C4359 Malignant melanoma of other part of trunk: Secondary | ICD-10-CM | POA: Diagnosis not present

## 2016-06-23 DIAGNOSIS — Z6833 Body mass index (BMI) 33.0-33.9, adult: Secondary | ICD-10-CM | POA: Diagnosis not present

## 2016-06-23 DIAGNOSIS — Z720 Tobacco use: Secondary | ICD-10-CM | POA: Diagnosis not present

## 2016-06-23 DIAGNOSIS — E785 Hyperlipidemia, unspecified: Secondary | ICD-10-CM | POA: Diagnosis not present

## 2016-07-25 DIAGNOSIS — C4359 Malignant melanoma of other part of trunk: Secondary | ICD-10-CM | POA: Diagnosis not present

## 2016-07-25 DIAGNOSIS — L988 Other specified disorders of the skin and subcutaneous tissue: Secondary | ICD-10-CM | POA: Diagnosis not present

## 2016-08-15 ENCOUNTER — Other Ambulatory Visit: Payer: Medicare Other

## 2016-08-15 DIAGNOSIS — R739 Hyperglycemia, unspecified: Secondary | ICD-10-CM | POA: Diagnosis not present

## 2016-08-15 DIAGNOSIS — E781 Pure hyperglyceridemia: Secondary | ICD-10-CM

## 2016-08-15 DIAGNOSIS — E669 Obesity, unspecified: Secondary | ICD-10-CM

## 2016-08-15 DIAGNOSIS — I10 Essential (primary) hypertension: Secondary | ICD-10-CM

## 2016-08-15 LAB — COMPLETE METABOLIC PANEL WITH GFR
ALT: 11 U/L (ref 9–46)
AST: 14 U/L (ref 10–35)
Albumin: 3.9 g/dL (ref 3.6–5.1)
Alkaline Phosphatase: 60 U/L (ref 40–115)
BUN: 8 mg/dL (ref 7–25)
CO2: 26 mmol/L (ref 20–31)
Calcium: 9 mg/dL (ref 8.6–10.3)
Chloride: 101 mmol/L (ref 98–110)
Creat: 0.69 mg/dL — ABNORMAL LOW (ref 0.70–1.18)
GFR, Est African American: >89 mL/min (ref >=60)
GFR, Est Non African American: >89 mL/min (ref >=60)
Glucose, Bld: 129 mg/dL — ABNORMAL HIGH (ref 65–99)
Potassium: 4.3 mmol/L (ref 3.5–5.3)
Sodium: 137 mmol/L (ref 135–146)
Total Bilirubin: 0.8 mg/dL (ref 0.2–1.2)
Total Protein: 7 g/dL (ref 6.1–8.1)

## 2016-08-15 LAB — CBC WITH DIFFERENTIAL/PLATELET
Basophils Absolute: 0 cells/uL (ref 0–200)
Basophils Relative: 0 %
Eosinophils Absolute: 110 cells/uL (ref 15–500)
Eosinophils Relative: 1 %
HCT: 44.9 % (ref 38.5–50.0)
Hemoglobin: 14.9 g/dL (ref 13.2–17.1)
Lymphocytes Relative: 13 %
Lymphs Abs: 1430 cells/uL (ref 850–3900)
MCH: 30.1 pg (ref 27.0–33.0)
MCHC: 33.2 g/dL (ref 32.0–36.0)
MCV: 90.7 fL (ref 80.0–100.0)
MPV: 11.6 fL (ref 7.5–12.5)
Monocytes Absolute: 1320 cells/uL — ABNORMAL HIGH (ref 200–950)
Monocytes Relative: 12 %
Neutro Abs: 8140 cells/uL — ABNORMAL HIGH (ref 1500–7800)
Neutrophils Relative %: 74 %
Platelets: 268 10*3/uL (ref 140–400)
RBC: 4.95 MIL/uL (ref 4.20–5.80)
RDW: 14 % (ref 11.0–15.0)
WBC: 11 10*3/uL — ABNORMAL HIGH (ref 3.8–10.8)

## 2016-08-15 LAB — LIPID PANEL
Cholesterol: 142 mg/dL (ref <200)
HDL: 32 mg/dL — ABNORMAL LOW (ref >40)
LDL Cholesterol: 82 mg/dL (ref <100)
Total CHOL/HDL Ratio: 4.4 Ratio (ref <5.0)
Triglycerides: 139 mg/dL (ref <150)
VLDL: 28 mg/dL (ref <30)

## 2016-08-15 LAB — HEMOGLOBIN A1C
Hgb A1c MFr Bld: 5.6 % (ref <5.7)
Mean Plasma Glucose: 114 mg/dL

## 2016-08-17 ENCOUNTER — Encounter: Payer: Self-pay | Admitting: Internal Medicine

## 2016-08-17 ENCOUNTER — Ambulatory Visit (INDEPENDENT_AMBULATORY_CARE_PROVIDER_SITE_OTHER): Payer: Medicare Other | Admitting: Internal Medicine

## 2016-08-17 VITALS — BP 150/80 | HR 87 | Temp 98.0°F | Wt 227.0 lb

## 2016-08-17 DIAGNOSIS — E782 Mixed hyperlipidemia: Secondary | ICD-10-CM

## 2016-08-17 DIAGNOSIS — R739 Hyperglycemia, unspecified: Secondary | ICD-10-CM

## 2016-08-17 DIAGNOSIS — I10 Essential (primary) hypertension: Secondary | ICD-10-CM | POA: Diagnosis not present

## 2016-08-17 DIAGNOSIS — C439 Malignant melanoma of skin, unspecified: Secondary | ICD-10-CM

## 2016-08-17 DIAGNOSIS — F411 Generalized anxiety disorder: Secondary | ICD-10-CM | POA: Diagnosis not present

## 2016-08-17 NOTE — Patient Instructions (Signed)
Improve hydration along with your salads.

## 2016-08-17 NOTE — Progress Notes (Signed)
Location:  [facility] clinic Provider:  Lisset Ketchem L. Mariea Clonts, D.O., C.M.D.  Code Status: full code Goals of Care:  Advanced Directives 08/17/2016  Does Patient Have a Medical Advance Directive? No  Would patient like information on creating a medical advance directive? No - Patient declined   Chief Complaint  Patient presents with  . Medical Management of Chronic Issues    HPI: Patient is a 71 y.o. male seen today for medical management of chronic diseases.    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  He had this biopsied by derm, Dr. Allyson Sabal, et al,  and then widely excised by Dr. Dalbert Batman 06/23/16.    He notices a pain of 5-6 at times and uses hydrocodone max once per day.  He says it feels like the incision site is pulling his left arm up.  Sees derm next on 09/14/16.  He had no metastatic disease on examination and margins were clear.  Needs regular derm f/u.  BP elevated as usual.  Did rush around and drank coffee this am.  Still off cigarettes.    Nerves doing better.  Has not been taking effexor for 3 weeks.  He couldn't afford it, and he feels he has not needed it lately.    Weight is unchanged.    He's had an URI for 2-3 days which explains his wbc count.  Has a ton of sneezing.   Kidney function stable.    Sugar average improved, but LDL went up.  He was out of statin for 20 days and could not pay for it so did not call us.     Past Medical History:  Diagnosis Date  . Allergic rhinitis due to pollen   . Anxiety state, unspecified   . Asymptomatic varicose veins   . Atherosclerosis of native arteries of the extremities, unspecified   . Contact dermatitis and other eczema due to other chemical products   . 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 Flatulence, eructation, and gas pain   . Headache(784.0)   . Hemorrhoids, external   . Herpes zoster with unspecified complication   . Hyperpotassemia   . Insomnia, unspecified   . Lumbago   .  Lumbago   . Myalgia and myositis, unspecified   . Nocturia   . Obesity, unspecified   . Osteoarthrosis, unspecified whether generalized or localized, unspecified site   . Other abnormal blood chemistry   . Other headache syndromes(339.89)   . Pure hyperglyceridemia   . Rash and other nonspecific skin eruption   . Rotator cuff (capsule) sprain   . Routine general medical examination at a health care facility   . Tobacco use disorder   . Unspecified essential hypertension   . Unspecified hypertrophic and atrophic condition of skin   . Unspecified sleep apnea     Past Surgical History:  Procedure Laterality Date  . HEMORRHOID SURGERY    . VARICOSE VEIN SURGERY      Allergies  Allergen Reactions  . Celebrex [Celecoxib]   . Erythromycin   . Oxycontin [Oxycodone Hcl]   . Penicillins   . Tetracyclines & Related     Allergies as of 08/17/2016      Reactions   Celebrex [celecoxib]    Erythromycin    Oxycontin [oxycodone Hcl]    Penicillins    Tetracyclines & Related       Medication List       Accurate as of 08/17/16  8:36  AM. Always use your most recent med list.          HYDROcodone-acetaminophen 5-325 MG tablet Commonly known as:  NORCO/VICODIN TK 1 OR 2 TS PO EVERY 4 HOURS PRN   loratadine 10 MG tablet Commonly known as:  CLARITIN Take 1 tablet (10 mg total) by mouth daily.   Melatonin 5 MG Caps Take 1 capsule (5 mg total) by mouth at bedtime as needed (insomnia).   simvastatin 20 MG tablet Commonly known as:  ZOCOR TAKE 1 TABLET BY MOUTH EVERY DAY AT 6PM   venlafaxine XR 37.5 MG 24 hr capsule Commonly known as:  EFFEXOR-XR TAKE 1 CAPSULE(37.5 MG) BY MOUTH DAILY WITH BREAKFAST       Review of Systems:  Review of Systems  Constitutional: Negative for chills, fever and malaise/fatigue.  HENT: Positive for congestion and hearing loss.   Eyes: Negative for blurred vision.       Glasses  Respiratory: Negative for cough and shortness of breath.     Cardiovascular: Negative for chest pain, palpitations and leg swelling.  Gastrointestinal: Negative for abdominal pain, blood in stool, constipation and melena.  Genitourinary: Negative for dysuria.  Musculoskeletal: Negative for back pain and falls.  Skin: Negative for rash.       Had left upper chest wall melanoma excised  Neurological: Negative for dizziness, loss of consciousness and weakness.  Endo/Heme/Allergies: Bruises/bleeds easily.  Psychiatric/Behavioral: Positive for memory loss. Negative for depression. The patient is not nervous/anxious.        No recent flashbacks/PTSD    Health Maintenance  Topic Date Due  . COLONOSCOPY  08/31/2020  . TETANUS/TDAP  06/14/2023  . INFLUENZA VACCINE  Completed  . ZOSTAVAX  Completed  . Hepatitis C Screening  Completed  . PNA vac Low Risk Adult  Completed    Physical Exam: Vitals:   08/17/16 0824  BP: (!) 150/80  Pulse: 87  Temp: 98 F (36.7 C)  TempSrc: Oral  SpO2: 96%  Weight: 227 lb (103 kg)   Body mass index is 32.57 kg/m. Physical Exam  Constitutional: He is oriented to person, place, and time. He appears well-developed and well-nourished. No distress.  Cardiovascular: Normal rate, regular rhythm, normal heart sounds and intact distal pulses.   Pulmonary/Chest: Effort normal.  Coarse breath sounds chronically, prolonged expiratory phase  Abdominal: Soft. Bowel sounds are normal.  Musculoskeletal: Normal range of motion. He exhibits no tenderness.  Neurological: He is alert and oriented to person, place, and time.  Repeating himself more today  Skin: Skin is warm and dry. Capillary refill takes less than 2 seconds.  Psychiatric: He has a normal mood and affect.  Not tremulous today    Labs reviewed: Basic Metabolic Panel:  Recent Labs  11/03/15 0835 02/07/16 0836 08/15/16 0856  NA 138 138 137  K 4.9 4.6 4.3  CL 100 104 101  CO2 20 24 26   GLUCOSE 114* 113* 129*  BUN 16 14 8   CREATININE 0.69* 0.76 0.69*   CALCIUM 9.3 9.4 9.0   Liver Function Tests:  Recent Labs  11/03/15 0835 02/07/16 0836 08/15/16 0856  AST 19 16 14   ALT 15 15 11   ALKPHOS 68 61 60  BILITOT 0.4 0.3 0.8  PROT 7.1 6.7 7.0  ALBUMIN 4.4 4.0 3.9   No results for input(s): LIPASE, AMYLASE in the last 8760 hours. No results for input(s): AMMONIA in the last 8760 hours. CBC:  Recent Labs  02/07/16 0836 08/15/16 0856  WBC 7.9 11.0*  NEUTROABS 4,345 8,140*  HGB 15.1 14.9  HCT 45.0 44.9  MCV 92.0 90.7  PLT 231 268   Lipid Panel:  Recent Labs  02/07/16 0836 05/08/16 0830 08/15/16 0856  CHOL 144 126 142  HDL 26* 24* 32*  LDLCALC 41 46 82  TRIG 387* 281* 139  CHOLHDL 5.5* 5.3* 4.4   Lab Results  Component Value Date   HGBA1C 5.6 08/15/2016    Assessment/Plan 1. Essential hypertension, benign - bp elevated here, but has been wnl as outpatient when he has attempted to donate blood so monitor with current therapy - CBC with Differential/Platelet; Future - COMPLETE METABOLIC PANEL WITH GFR; Future  2. Melanoma of skin ([place]) -had excision of melanoma on left anterior chest -clean borders and no mets -monitoring through dermatology  3. Hyperglycemia - ongoing, but actually was improved last check - Hemoglobin A1c; Future  4. Generalized anxiety disorder -better this time -has stopped effexor -I'm concerned he will need to go back on it or an alternative ssri b/c he has had problems with panic attacks and PTSD nightmares and flashbacks   5. Mixed hyperlipidemia - has restarted his statin therapy when he was able to pay for it - CBC with Differential/Platelet; Future - COMPLETE METABOLIC PANEL WITH GFR; Future - Lipid panel; Future  Not sure why he has difficulty affording meds with medicare and tricare for life.  It's hard to get a story from him.  Labs/tests ordered:   Orders Placed This Encounter  Procedures  . CBC with Differential/Platelet    Standing Status:   Future    Standing  Expiration Date:   02/14/2017  . COMPLETE METABOLIC PANEL WITH GFR    [facility]    Standing Status:   Future    Standing Expiration Date:   02/14/2017  . Hemoglobin A1c    Standing Status:   Future    Standing Expiration Date:   02/14/2017  . Lipid panel    Standing Status:   Future    Standing Expiration Date:   02/14/2017    Order Specific Question:   Has the patient fasted?    Answer:   Yes   Next appt:  11/20/2016 labs before  [place]s Gangemi L. Dalayah Deahl, D.O. [facility] [address], [place] [address] Cell Phone (Mon-Fri 8am-5pm):  [phone] On Call:  [phone] & follow prompts after 5pm & weekends Office Phone:  [phone] Office Fax:  [phone]

## 2016-09-14 DIAGNOSIS — Z85820 Personal history of malignant melanoma of skin: Secondary | ICD-10-CM | POA: Diagnosis not present

## 2016-09-14 DIAGNOSIS — D225 Melanocytic nevi of trunk: Secondary | ICD-10-CM | POA: Diagnosis not present

## 2016-09-14 DIAGNOSIS — L570 Actinic keratosis: Secondary | ICD-10-CM | POA: Diagnosis not present

## 2016-09-14 DIAGNOSIS — D235 Other benign neoplasm of skin of trunk: Secondary | ICD-10-CM | POA: Diagnosis not present

## 2016-09-14 DIAGNOSIS — L820 Inflamed seborrheic keratosis: Secondary | ICD-10-CM | POA: Diagnosis not present

## 2016-09-14 DIAGNOSIS — D485 Neoplasm of uncertain behavior of skin: Secondary | ICD-10-CM | POA: Diagnosis not present

## 2016-09-14 DIAGNOSIS — D1801 Hemangioma of skin and subcutaneous tissue: Secondary | ICD-10-CM | POA: Diagnosis not present

## 2016-09-18 DIAGNOSIS — H52203 Unspecified astigmatism, bilateral: Secondary | ICD-10-CM | POA: Diagnosis not present

## 2016-09-18 DIAGNOSIS — H25013 Cortical age-related cataract, bilateral: Secondary | ICD-10-CM | POA: Diagnosis not present

## 2016-09-18 DIAGNOSIS — H524 Presbyopia: Secondary | ICD-10-CM | POA: Diagnosis not present

## 2016-09-18 DIAGNOSIS — H2513 Age-related nuclear cataract, bilateral: Secondary | ICD-10-CM | POA: Diagnosis not present

## 2016-09-20 DIAGNOSIS — D485 Neoplasm of uncertain behavior of skin: Secondary | ICD-10-CM | POA: Diagnosis not present

## 2016-09-20 DIAGNOSIS — L900 Lichen sclerosus et atrophicus: Secondary | ICD-10-CM | POA: Diagnosis not present

## 2016-10-17 DIAGNOSIS — H269 Unspecified cataract: Secondary | ICD-10-CM

## 2016-10-17 DIAGNOSIS — H25812 Combined forms of age-related cataract, left eye: Secondary | ICD-10-CM | POA: Diagnosis not present

## 2016-10-17 DIAGNOSIS — H2512 Age-related nuclear cataract, left eye: Secondary | ICD-10-CM | POA: Diagnosis not present

## 2016-10-17 HISTORY — DX: Unspecified cataract: H26.9

## 2016-11-08 ENCOUNTER — Other Ambulatory Visit: Payer: Self-pay | Admitting: Internal Medicine

## 2016-11-08 DIAGNOSIS — R739 Hyperglycemia, unspecified: Secondary | ICD-10-CM

## 2016-11-08 DIAGNOSIS — E781 Pure hyperglyceridemia: Secondary | ICD-10-CM

## 2016-11-16 ENCOUNTER — Other Ambulatory Visit: Payer: Medicare Other

## 2016-11-16 ENCOUNTER — Ambulatory Visit (INDEPENDENT_AMBULATORY_CARE_PROVIDER_SITE_OTHER): Payer: Medicare Other

## 2016-11-16 VITALS — BP 150/80 | HR 78 | Temp 98.3°F | Ht 70.0 in | Wt 229.0 lb

## 2016-11-16 DIAGNOSIS — E782 Mixed hyperlipidemia: Secondary | ICD-10-CM

## 2016-11-16 DIAGNOSIS — I10 Essential (primary) hypertension: Secondary | ICD-10-CM

## 2016-11-16 DIAGNOSIS — R739 Hyperglycemia, unspecified: Secondary | ICD-10-CM | POA: Diagnosis not present

## 2016-11-16 DIAGNOSIS — Z0000 Encounter for general adult medical examination without abnormal findings: Secondary | ICD-10-CM

## 2016-11-16 LAB — CBC WITH DIFFERENTIAL/PLATELET
Basophils Absolute: 66 cells/uL (ref 0–200)
Basophils Relative: 1 %
Eosinophils Absolute: 264 cells/uL (ref 15–500)
Eosinophils Relative: 4 %
HCT: 46.2 % (ref 38.5–50.0)
Hemoglobin: 15.4 g/dL (ref 13.2–17.1)
Lymphocytes Relative: 30 %
Lymphs Abs: 1980 cells/uL (ref 850–3900)
MCH: 30.4 pg (ref 27.0–33.0)
MCHC: 33.3 g/dL (ref 32.0–36.0)
MCV: 91.1 fL (ref 80.0–100.0)
MPV: 11.7 fL (ref 7.5–12.5)
Monocytes Absolute: 792 cells/uL (ref 200–950)
Monocytes Relative: 12 %
Neutro Abs: 3498 cells/uL (ref 1500–7800)
Neutrophils Relative %: 53 %
Platelets: 236 10*3/uL (ref 140–400)
RBC: 5.07 MIL/uL (ref 4.20–5.80)
RDW: 14.8 % (ref 11.0–15.0)
WBC: 6.6 10*3/uL (ref 3.8–10.8)

## 2016-11-16 LAB — LIPID PANEL
Cholesterol: 111 mg/dL (ref <200)
HDL: 29 mg/dL — ABNORMAL LOW (ref >40)
LDL Cholesterol: 30 mg/dL (ref <100)
Total CHOL/HDL Ratio: 3.8 Ratio (ref <5.0)
Triglycerides: 262 mg/dL — ABNORMAL HIGH (ref <150)
VLDL: 52 mg/dL — ABNORMAL HIGH (ref <30)

## 2016-11-16 LAB — COMPLETE METABOLIC PANEL WITH GFR
ALT: 13 U/L (ref 9–46)
AST: 17 U/L (ref 10–35)
Albumin: 4.2 g/dL (ref 3.6–5.1)
Alkaline Phosphatase: 51 U/L (ref 40–115)
BUN: 16 mg/dL (ref 7–25)
CO2: 22 mmol/L (ref 20–31)
Calcium: 9.1 mg/dL (ref 8.6–10.3)
Chloride: 106 mmol/L (ref 98–110)
Creat: 0.75 mg/dL (ref 0.70–1.18)
GFR, Est African American: >89 mL/min (ref >=60)
GFR, Est Non African American: >89 mL/min (ref >=60)
Glucose, Bld: 122 mg/dL — ABNORMAL HIGH (ref 65–99)
Potassium: 4.8 mmol/L (ref 3.5–5.3)
Sodium: 139 mmol/L (ref 135–146)
Total Bilirubin: 0.4 mg/dL (ref 0.2–1.2)
Total Protein: 7 g/dL (ref 6.1–8.1)

## 2016-11-16 NOTE — Progress Notes (Signed)
   I reviewed health advisor's note, was available for consultation and agree with the assessment and plan as written.  BP elevated--need to address at appt  Dhairya Corales L. Jermey Closs, D.O. [facility] [address], [place] [address] Cell Phone (Mon-Fri 8am-5pm):  [phone] On Call:  [phone] & follow prompts after 5pm & weekends Office Phone:  [phone] Office Fax:  [phone]   Quick Notes   Health Maintenance: None  Abnormal Screen: MMSE 26/30. Passed clock drawing. 1st BP: 160/98 2nd BP: 150/80  Patient Concerns: None  Nurse Concerns: None

## 2016-11-16 NOTE — Patient Instructions (Signed)
David [name] , Thank you for taking time to come for your Medicare Wellness Visit. I appreciate your ongoing commitment to your health goals. Please review the following plan we discussed and let me know if I can assist you in the future.   Screening recommendations/referrals: Colonoscopy up to dat Recommended yearly ophthalmology/optometry visit for glaucoma screening and checkup Recommended yearly dental visit for hygiene and checkup  Vaccinations: Influenza vaccine up to date Pneumococcal vaccine up to date Tdap vaccine up to date. Due 06/14/2023 Shingles vaccine up to date. If you want the new one let us know and we will put in a prescription for you.    Advanced directives: Advance directive discussed with you today. I have provided a copy for you to complete at home and have notarized. Once this is complete please bring a copy in to our office so we can scan it into your chart.  Conditions/risks identified: None  Next appointment: Dr Mariea Clonts 5/14 @ 8:30am  Preventive Care 65 Years and Older, Male Preventive care refers to lifestyle choices and visits with your health care provider that can promote health and wellness. What does preventive care include?  A yearly physical exam. This is also called an annual well check.  Dental exams once or twice a year.  Routine eye exams. Ask your health care provider how often you should have your eyes checked.  Personal lifestyle choices, including:  Daily care of your teeth and gums.  Regular physical activity.  Eating a healthy diet.  Avoiding tobacco and drug use.  Limiting alcohol use.  Practicing safe sex.  Taking low doses of aspirin every day.  Taking vitamin and mineral supplements as recommended by your health care provider. What happens during an annual well check? The services and screenings done by your health care provider during your annual well check will depend on your age, overall health, lifestyle risk factors,  and family history of disease. Counseling  Your health care provider may ask you questions about your:  Alcohol use.  Tobacco use.  Drug use.  Emotional well-being.  Home and relationship well-being.  Sexual activity.  Eating habits.  History of falls.  Memory and ability to understand (cognition).  Work and work Statistician. Screening  You may have the following tests or measurements:  Height, weight, and BMI.  Blood pressure.  Lipid and cholesterol levels. These may be checked every 5 years, or more frequently if you are over 14 years old.  Skin check.  Lung cancer screening. You may have this screening every year starting at age 13 if you have a 30-pack-year history of smoking and currently smoke or have quit within the past 15 years.  Fecal occult blood test (FOBT) of the stool. You may have this test every year starting at age 98.  Flexible sigmoidoscopy or colonoscopy. You may have a sigmoidoscopy every 5 years or a colonoscopy every 10 years starting at age 27.  Prostate cancer screening. Recommendations will vary depending on your family history and other risks.  Hepatitis C blood test.  Hepatitis B blood test.  Sexually transmitted disease (STD) testing.  Diabetes screening. This is done by checking your blood sugar (glucose) after you have not eaten for a while (fasting). You may have this done every 1-3 years.  Abdominal aortic aneurysm (AAA) screening. You may need this if you are a current or former smoker.  Osteoporosis. You may be screened starting at age 64 if you are at high risk. Talk with your  health care provider about your test results, treatment options, and if necessary, the need for more tests. Vaccines  Your health care provider may recommend certain vaccines, such as:  Influenza vaccine. This is recommended every year.  Tetanus, diphtheria, and acellular pertussis (Tdap, Td) vaccine. You may need a Td booster every 10  years.  Zoster vaccine. You may need this after age 30.  Pneumococcal 13-valent conjugate (PCV13) vaccine. One dose is recommended after age 18.  Pneumococcal polysaccharide (PPSV23) vaccine. One dose is recommended after age 42. Talk to your health care provider about which screenings and vaccines you need and how often you need them. This information is not intended to replace advice given to you by your health care provider. Make sure you discuss any questions you have with your health care provider. Document Released: 07/23/2015 Document Revised: 03/15/2016 Document Reviewed: 04/27/2015 Elsevier Interactive Patient Education  2017 [place] Prevention in the Home Falls can cause injuries. They can happen to people of all ages. There are many things you can do to make your home safe and to help prevent falls. What can I do on the outside of my home?  Regularly fix the edges of walkways and driveways and fix any cracks.  Remove anything that might make you trip as you walk through a door, such as a raised step or threshold.  Trim any bushes or trees on the path to your home.  Use bright outdoor lighting.  Clear any walking paths of anything that might make someone trip, such as rocks or tools.  Regularly check to see if handrails are loose or broken. Make sure that both sides of any steps have handrails.  Any raised decks and porches should have guardrails on the edges.  Have any leaves, snow, or ice cleared regularly.  Use sand or salt on walking paths during winter.  Clean up any spills in your garage right away. This includes oil or grease spills. What can I do in the bathroom?  Use night lights.  Install grab bars by the toilet and in the tub and shower. Do not use towel bars as grab bars.  Use non-skid mats or decals in the tub or shower.  If you need to sit down in the shower, use a plastic, non-slip stool.  Keep the floor dry. Clean up any water that  spills on the floor as soon as it happens.  Remove soap buildup in the tub or shower regularly.  Attach bath mats securely with double-sided non-slip rug tape.  Do not have throw rugs and other things on the floor that can make you trip. What can I do in the bedroom?  Use night lights.  Make sure that you have a light by your bed that is easy to reach.  Do not use any sheets or blankets that are too big for your bed. They should not hang down onto the floor.  Have a firm chair that has side arms. You can use this for support while you get dressed.  Do not have throw rugs and other things on the floor that can make you trip. What can I do in the kitchen?  Clean up any spills right away.  Avoid walking on wet floors.  Keep items that you use a lot in easy-to-reach places.  If you need to reach something above you, use a strong step stool that has a grab bar.  Keep electrical cords out of the way.  Do not use  floor polish or wax that makes floors slippery. If you must use wax, use non-skid floor wax.  Do not have throw rugs and other things on the floor that can make you trip. What can I do with my stairs?  Do not leave any items on the stairs.  Make sure that there are handrails on both sides of the stairs and use them. Fix handrails that are broken or loose. Make sure that handrails are as long as the stairways.  Check any carpeting to make sure that it is firmly attached to the stairs. Fix any carpet that is loose or worn.  Avoid having throw rugs at the top or bottom of the stairs. If you do have throw rugs, attach them to the floor with carpet tape.  Make sure that you have a light switch at the top of the stairs and the bottom of the stairs. If you do not have them, ask someone to add them for you. What else can I do to help prevent falls?  Wear shoes that:  Do not have high heels.  Have rubber bottoms.  Are comfortable and fit you well.  Are closed at the  toe. Do not wear sandals.  If you use a stepladder:  Make sure that it is fully opened. Do not climb a closed stepladder.  Make sure that both sides of the stepladder are locked into place.  Ask someone to hold it for you, if possible.  Clearly mark and make sure that you can see:  Any grab bars or handrails.  First and last steps.  Where the edge of each step is.  Use tools that help you move around (mobility aids) if they are needed. These include:  Canes.  Walkers.  Scooters.  Crutches.  Turn on the lights when you go into a dark area. Replace any light bulbs as soon as they burn out.  Set up your furniture so you have a clear path. Avoid moving your furniture around.  If any of your floors are uneven, fix them.  If there are any pets around you, be aware of where they are.  Review your medicines with your doctor. Some medicines can make you feel dizzy. This can increase your chance of falling. Ask your doctor what other things that you can do to help prevent falls. This information is not intended to replace advice given to you by your health care provider. Make sure you discuss any questions you have with your health care provider. Document Released: 04/22/2009 Document Revised: 12/02/2015 Document Reviewed: 07/31/2014 Elsevier Interactive Patient Education  2017 [employer].

## 2016-11-16 NOTE — Progress Notes (Signed)
Subjective:   Sender Rueb. is a 71 y.o. male who presents for Medicare Annual/Subsequent preventive examination.     Objective:    Vitals: BP (!) 150/80 (BP Location: Right Arm, Patient Position: Sitting)   Pulse 78   Temp 98.3 F (36.8 C) (Oral)   Ht 5\' 10"  (1.778 m)   Wt 229 lb (103.9 kg)   SpO2 96%   BMI 32.86 kg/m   Body mass index is 32.86 kg/m.  Tobacco History  Smoking Status  . Former Smoker  . Packs/day: 1.50  . Years: 55.00  . Types: Cigarettes  . Quit date: 07/30/2015  Smokeless Tobacco  . Never Used    Comment: pt thinks increased effexor dose helped him to quit     Counseling given: Not Answered   Past Medical History:  Diagnosis Date  . Allergic rhinitis due to pollen   . Anxiety state, unspecified   . Asymptomatic varicose veins   . Atherosclerosis of native arteries of the extremities, unspecified   . Cataract 10/17/2016   L Eye  . Contact dermatitis and other eczema due to other chemical products   . 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 Flatulence, eructation, and gas pain   . Headache(784.0)   . Hemorrhoids, external   . Herpes zoster with unspecified complication   . Hyperpotassemia   . Insomnia, unspecified   . Lumbago   . Lumbago   . Myalgia and myositis, unspecified   . Nocturia   . Obesity, unspecified   . Osteoarthrosis, unspecified whether generalized or localized, unspecified site   . Other abnormal blood chemistry   . Other headache syndromes(339.89)   . Pure hyperglyceridemia   . Rash and other nonspecific skin eruption   . Rotator cuff (capsule) sprain   . Routine general medical examination at a health care facility   . Tobacco use disorder   . Unspecified essential hypertension   . Unspecified hypertrophic and atrophic condition of skin   . Unspecified sleep apnea    Past Surgical History:  Procedure Laterality Date  . HEMORRHOID SURGERY    . VARICOSE VEIN SURGERY     Family  History  Problem Relation Age of Onset  . Diabetes Mother   . Heart disease Mother   . Stroke Mother   . Stroke Father    History  Sexual Activity  . Sexual activity: Not on file    Outpatient Encounter Prescriptions as of 11/16/2016  Medication Sig  . loratadine (CLARITIN) 10 MG tablet Take 1 tablet (10 mg total) by mouth daily.  . Melatonin 5 MG CAPS Take 1 capsule (5 mg total) by mouth at bedtime as needed (insomnia).  . simvastatin (ZOCOR) 20 MG tablet TAKE 1 TABLET BY MOUTH EVERY DAY AT 6 PM  . venlafaxine XR (EFFEXOR-XR) 37.5 MG 24 hr capsule TAKE 1 CAPSULE(37.5 MG) BY MOUTH DAILY WITH BREAKFAST  . [DISCONTINUED] HYDROcodone-acetaminophen (NORCO/VICODIN) 5-325 MG tablet TK 1 OR 2 TS PO EVERY 4 HOURS PRN   No facility-administered encounter medications on file as of 11/16/2016.     Activities of Daily Living In your present state of health, do you have any difficulty performing the following activities: 11/16/2016  Hearing? N  Vision? Y  Difficulty concentrating or making decisions? N  Walking or climbing stairs? Y  Dressing or bathing? N  Doing errands, shopping? N  Preparing Food and eating ? N  Using the Toilet? N  In the past six  months, have you accidently leaked urine? N  Do you have problems with loss of bowel control? Y  Managing your Medications? N  Managing your Finances? N  Housekeeping or managing your Housekeeping? N  Some recent data might be hidden    Patient Care Team: Gayland Curry, DO as PCP - General (Geriatric Medicine) Katy Apo, MD as Consulting Physician (Ophthalmology)   Assessment:     Current Exercise Habits: The patient does not participate in regular exercise at present, Exercise limited by: None identified  Goals    . lose weight          Starting 11/16/16 I will start trying to walk to lose weight.     . Quit smoking / using tobacco      Fall Risk Fall Risk  11/16/2016 08/17/2016 05/12/2016 02/10/2016 11/08/2015  Falls in the  past year? Yes No Yes Yes No  Number falls in past yr: 2 or more - 1 2 or more -  Injury with Fall? No - No No -   Depression Screen PHQ 2/9 Scores 11/16/2016 08/17/2016 05/12/2016 02/10/2016  PHQ - 2 Score 0 0 0 0    Cognitive Function MMSE - Mini Mental State Exam 11/16/2016 11/08/2015 04/10/2014  Orientation to time 5 5 5   Orientation to Place 5 5 5   Registration 3 3 3   Attention/ Calculation 5 5 5   Recall 0 1 1  Language- name 2 objects 2 2 2   Language- repeat 1 1 1   Language- follow 3 step command 3 3 3   Language- read & follow direction 1 1 1   Write a sentence 1 1 1   Copy design 0 1 1  Total score 26 28 28         Immunization History  Administered Date(s) Administered  . Influenza,inj,Quad PF,36+ Mos 06/13/2013, 04/10/2014, 05/06/2015, 05/12/2016  . Influenza-Unspecified 04/09/2010, 05/09/2012  . Pneumococcal Conjugate-13 04/16/2014  . Pneumococcal Polysaccharide-23 01/26/2011, 11/25/2015  . Td 07/10/2000, 06/13/2013  . Zoster 08/05/2012   Screening Tests Health Maintenance  Topic Date Due  . INFLUENZA VACCINE  02/07/2017  . COLONOSCOPY  08/31/2020  . TETANUS/TDAP  06/14/2023  . Hepatitis C Screening  Completed  . PNA vac Low Risk Adult  Completed      Plan:     I have personally reviewed and addressed the Medicare Annual Wellness questionnaire and have noted the following in the patient's chart:  A. Medical and social history B. Use of alcohol, tobacco or illicit drugs  C. Current medications and supplements D. Functional ability and status E.  Nutritional status F.  Physical activity G. Advance directives H. List of other physicians I.  Hospitalizations, surgeries, and ER visits in previous 12 months J.  [place] to include hearing, vision, cognitive, depression L. Referrals and appointments - none  In addition, I have reviewed and discussed with patient certain preventive protocols, quality metrics, and best practice recommendations. A written  personalized care plan for preventive services as well as general preventive health recommendations were provided to patient.  See attached scanned questionnaire for additional information.   Signed,   Rich Reining, RN Nurse Health Advisor

## 2016-11-17 ENCOUNTER — Encounter: Payer: Self-pay | Admitting: *Deleted

## 2016-11-17 LAB — HEMOGLOBIN A1C
Hgb A1c MFr Bld: 5.7 % — ABNORMAL HIGH (ref <5.7)
Mean Plasma Glucose: 117 mg/dL

## 2016-11-20 ENCOUNTER — Ambulatory Visit (INDEPENDENT_AMBULATORY_CARE_PROVIDER_SITE_OTHER): Payer: Medicare Other | Admitting: Internal Medicine

## 2016-11-20 ENCOUNTER — Encounter: Payer: Self-pay | Admitting: Internal Medicine

## 2016-11-20 VITALS — BP 158/90 | HR 76 | Temp 98.4°F | Wt 228.0 lb

## 2016-11-20 DIAGNOSIS — Z23 Encounter for immunization: Secondary | ICD-10-CM

## 2016-11-20 DIAGNOSIS — R739 Hyperglycemia, unspecified: Secondary | ICD-10-CM | POA: Diagnosis not present

## 2016-11-20 DIAGNOSIS — F5105 Insomnia due to other mental disorder: Secondary | ICD-10-CM | POA: Diagnosis not present

## 2016-11-20 DIAGNOSIS — Z961 Presence of intraocular lens: Secondary | ICD-10-CM

## 2016-11-20 DIAGNOSIS — E669 Obesity, unspecified: Secondary | ICD-10-CM

## 2016-11-20 DIAGNOSIS — Z9842 Cataract extraction status, left eye: Secondary | ICD-10-CM | POA: Diagnosis not present

## 2016-11-20 DIAGNOSIS — I10 Essential (primary) hypertension: Secondary | ICD-10-CM

## 2016-11-20 DIAGNOSIS — F411 Generalized anxiety disorder: Secondary | ICD-10-CM

## 2016-11-20 DIAGNOSIS — F99 Mental disorder, not otherwise specified: Secondary | ICD-10-CM

## 2016-11-20 DIAGNOSIS — E781 Pure hyperglyceridemia: Secondary | ICD-10-CM | POA: Diagnosis not present

## 2016-11-20 MED ORDER — ZOSTER VAC RECOMB ADJUVANTED 50 MCG/0.5ML IM SUSR: 0.5000 mL | Freq: Once | INTRAMUSCULAR | 1 refills | Status: AC

## 2016-11-20 NOTE — Progress Notes (Signed)
Location:  [facility] clinic Provider:  Mailyn Steichen L. Mariea Clonts, D.O., C.M.D.  Code Status: full code Goals of Care:  Advanced Directives 11/20/2016  Does Patient Have a Medical Advance Directive? No  Would patient like information on creating a medical advance directive? No - Patient declined  living will/hcpoa previously given to him but he has not yet completed it  Chief Complaint  Patient presents with  . Medical Management of Chronic Issues    72mth follow-up    HPI: Patient is a 71 y.o. male seen today for medical management of chronic diseases.    Has not taken his venlafaxine for over a month.  Ran out and could not afford to get more.  He's not been very anxious.  He can't tell a difference being w/o it.    Not using melatonin either b/c he couldn't afford to buy it.  Is laying down for 1.5 hrs before he'll fall asleep.  Melatonin was helping him about an hr before bed when he did his crosswords and bible study.  Has been waking up in the middle of the night.  Has not had melatonin since before he saw me last time.    Had his left cataract surgery and has his right cataract surgery next month.  May not need his glasses after that second surgery.    TG ran up again b/c he ran out of his cholesterol medication again in b/w.  Went from 139 to 262.   LDL bad cholesterol still good.  HDL remains low. Sugar avg up just slightly.    BP elevated at [facility] and at first here. He had his coffee just before coming.  Recheck was worse at 158/90.    Past Medical History:  Diagnosis Date  . Allergic rhinitis due to pollen   . Anxiety state, unspecified   . Asymptomatic varicose veins   . Atherosclerosis of native arteries of the extremities, unspecified   . Cataract 10/17/2016   L Eye  . Contact dermatitis and other eczema due to other chemical products   . 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 Flatulence, eructation, and gas pain   . Headache(784.0)   . Hemorrhoids,  external   . Herpes zoster with unspecified complication   . Hyperpotassemia   . Insomnia, unspecified   . Lumbago   . Lumbago   . Myalgia and myositis, unspecified   . Nocturia   . Obesity, unspecified   . Osteoarthrosis, unspecified whether generalized or localized, unspecified site   . Other abnormal blood chemistry   . Other headache syndromes(339.89)   . Pure hyperglyceridemia   . Rash and other nonspecific skin eruption   . Rotator cuff (capsule) sprain   . Routine general medical examination at a health care facility   . Tobacco use disorder   . Unspecified essential hypertension   . Unspecified hypertrophic and atrophic condition of skin   . Unspecified sleep apnea     Past Surgical History:  Procedure Laterality Date  . HEMORRHOID SURGERY    . VARICOSE VEIN SURGERY      Allergies  Allergen Reactions  . Celebrex [Celecoxib]   . Erythromycin   . Oxycontin [Oxycodone Hcl]   . Penicillins   . Tetracyclines & Related     Allergies as of 11/20/2016      Reactions   Celebrex [celecoxib]    Erythromycin    Oxycontin [oxycodone Hcl]    Penicillins    Tetracyclines & Related  Medication List       Accurate as of 11/20/16  8:46 AM. Always use your most recent med list.          loratadine 10 MG tablet Commonly known as:  CLARITIN Take 1 tablet (10 mg total) by mouth daily.   simvastatin 20 MG tablet Commonly known as:  ZOCOR TAKE 1 TABLET BY MOUTH EVERY DAY AT 6 PM   venlafaxine XR 37.5 MG 24 hr capsule Commonly known as:  EFFEXOR-XR TAKE 1 CAPSULE(37.5 MG) BY MOUTH DAILY WITH BREAKFAST       Review of Systems:  Review of Systems  Constitutional: Negative for chills, fever, malaise/fatigue and weight loss.  HENT: Positive for hearing loss. Negative for congestion.   Eyes:       S/p left cataract surgery and for right cataract surgery  Respiratory: Negative for cough and shortness of breath.   Cardiovascular: Negative for chest pain,  palpitations and leg swelling.  Gastrointestinal: Negative for abdominal pain, blood in stool, constipation and melena.  Genitourinary: Negative for dysuria.  Musculoskeletal: Negative for falls and joint pain.  Skin: Negative for itching and rash.  Neurological: Negative for dizziness, loss of consciousness and weakness.  Psychiatric/Behavioral: Negative for depression and memory loss. The patient is nervous/anxious and has insomnia.        PTSD    Health Maintenance  Topic Date Due  . INFLUENZA VACCINE  02/07/2017  . COLONOSCOPY  08/31/2020  . TETANUS/TDAP  06/14/2023  . Hepatitis C Screening  Completed  . PNA vac Low Risk Adult  Completed    Physical Exam: Vitals:   11/20/16 0824  BP: (!) 148/80  Pulse: 76  Temp: 98.4 F (36.9 C)  TempSrc: Oral  SpO2: 97%  Weight: 228 lb (103.4 kg)   Body mass index is 32.71 kg/m. Physical Exam  Constitutional: He is oriented to person, place, and time. He appears well-developed and well-nourished. No distress.  Cardiovascular: Normal rate, regular rhythm, normal heart sounds and intact distal pulses.   Pulmonary/Chest: Effort normal and breath sounds normal. No respiratory distress.  Abdominal: Soft. Bowel sounds are normal. He exhibits no distension. There is no tenderness.  Musculoskeletal: Normal range of motion.  Neurological: He is alert and oriented to person, place, and time.  Skin: Skin is warm and dry.  Psychiatric: He has a normal mood and affect.    Labs reviewed: Basic Metabolic Panel:  Recent Labs  02/07/16 0836 08/15/16 0856 11/16/16 0823  NA 138 137 139  K 4.6 4.3 4.8  CL 104 101 106  CO2 24 26 22   GLUCOSE 113* 129* 122*  BUN 14 8 16   CREATININE 0.76 0.69* 0.75  CALCIUM 9.4 9.0 9.1   Liver Function Tests:  Recent Labs  02/07/16 0836 08/15/16 0856 11/16/16 0823  AST 16 14 17   ALT 15 11 13   ALKPHOS 61 60 51  BILITOT 0.3 0.8 0.4  PROT 6.7 7.0 7.0  ALBUMIN 4.0 3.9 4.2   No results for input(s):  LIPASE, AMYLASE in the last 8760 hours. No results for input(s): AMMONIA in the last 8760 hours. CBC:  Recent Labs  02/07/16 0836 08/15/16 0856 11/16/16 0823  WBC 7.9 11.0* 6.6  NEUTROABS 4,345 8,140* 3,498  HGB 15.1 14.9 15.4  HCT 45.0 44.9 46.2  MCV 92.0 90.7 91.1  PLT 231 268 236   Lipid Panel:  Recent Labs  05/08/16 0830 08/15/16 0856 11/16/16 0823  CHOL 126 142 111  HDL 24* 32* 29*  LDLCALC  46 82 30  TRIG 281* 139 262*  CHOLHDL 5.3* 4.4 3.8   Lab Results  Component Value Date   HGBA1C 5.7 (H) 11/16/2016    Assessment/Plan 1. Insomnia due to other mental disorder -advised to restart his melatonin 5mg  which was helping him sleep better and to fall asleep more quickly  2. S/P cataract extraction and insertion of intraocular lens, left - doing well, just finished prednisone drops, is to restart his allergy drops  3. Hypertriglyceridemia -TG up after zocor run out and he could not afford to buy more - Lipid panel; Future  4. Hyperglycemia - Hemoglobin A1c; Future - sugar average up slightly after out of his zocor  5. Generalized anxiety disorder -improved lately, off effexor and doing ok  6. Obesity (BMI 30-39.9) - ongoing, not very active except to do yardwork, encouraged regular exercise, has made some improvements in diet over the past several years -I'm sure he's got sleep apnea, but he won't wear a cpap so no sense assessing this (runs out of meds and does not refill them as directed)  7. Need for shingles vaccine - Zoster Vac Recomb Adjuvanted [facility]) injection; Inject 0.5 mLs into the muscle once.  Dispense: 0.5 mL; Refill: 1  8.  Essential htn -bp elevated at [facility], again today -will have him return once more to see if it's better in 2 wks--if not, will need medication  Labs/tests ordered:   Orders Placed This Encounter  Procedures  . Hemoglobin A1c    Standing Status:   Future    Standing Expiration Date:   07/23/2017  . Lipid panel     Standing Status:   Future    Standing Expiration Date:   07/23/2017    Next appt:  12/08/2016 bp check  Emrys Mceachron L. Jocilynn Grade, D.O. [facility] [address], [place] [address] Cell Phone (Mon-Fri 8am-5pm):  [phone] On Call:  [phone] & follow prompts after 5pm & weekends Office Phone:  [phone] Office Fax:  [phone]

## 2016-12-08 ENCOUNTER — Encounter: Payer: Self-pay | Admitting: Internal Medicine

## 2016-12-08 ENCOUNTER — Ambulatory Visit (INDEPENDENT_AMBULATORY_CARE_PROVIDER_SITE_OTHER): Payer: Medicare Other | Admitting: Internal Medicine

## 2016-12-08 VITALS — BP 152/94 | HR 75 | Temp 98.2°F | Ht 70.0 in | Wt 225.0 lb

## 2016-12-08 DIAGNOSIS — I10 Essential (primary) hypertension: Secondary | ICD-10-CM | POA: Diagnosis not present

## 2016-12-08 MED ORDER — LOSARTAN POTASSIUM 50 MG PO TABS
50.0000 mg | ORAL_TABLET | Freq: Every day | ORAL | 3 refills | Status: DC
Start: 2016-12-08 — End: 2017-03-28

## 2016-12-08 NOTE — Patient Instructions (Signed)
Decrease your salt intake to less than 1500mg  per day.  Read your nutrition facts on the packages.   DASH Eating Plan DASH stands for "Dietary Approaches to Stop Hypertension." The DASH eating plan is a healthy eating plan that has been shown to reduce high blood pressure (hypertension). It may also reduce your risk for type 2 diabetes, heart disease, and stroke. The DASH eating plan may also help with weight loss. What are tips for following this plan? General guidelines  Avoid eating more than 2,300 mg (milligrams) of salt (sodium) a day. If you have hypertension, you may need to reduce your sodium intake to 1,500 mg a day.  Limit alcohol intake to no more than 1 drink a day for nonpregnant women and 2 drinks a day for men. One drink equals 12 oz of beer, 5 oz of wine, or 1 oz of hard liquor.  Work with your health care provider to maintain a healthy body weight or to lose weight. Ask what an ideal weight is for you.  Get at least 30 minutes of exercise that causes your heart to beat faster (aerobic exercise) most days of the week. Activities may include walking, swimming, or biking.  Work with your health care provider or diet and nutrition specialist (dietitian) to adjust your eating plan to your individual calorie needs. Reading food labels  Check food labels for the amount of sodium per serving. Choose foods with less than 5 percent of the Daily Value of sodium. Generally, foods with less than 300 mg of sodium per serving fit into this eating plan.  To find whole grains, look for the word "whole" as the first word in the ingredient list. Shopping  Buy products labeled as "low-sodium" or "no salt added."  Buy fresh foods. Avoid canned foods and premade or frozen meals. Cooking  Avoid adding salt when cooking. Use salt-free seasonings or herbs instead of table salt or sea salt. Check with your health care provider or pharmacist before using salt substitutes.  Do not fry foods. Cook  foods using healthy methods such as baking, boiling, grilling, and broiling instead.  Cook with heart-healthy oils, such as olive, canola, soybean, or sunflower oil. Meal planning   Eat a balanced diet that includes: ? 5 or more servings of fruits and vegetables each day. At each meal, try to fill half of your plate with fruits and vegetables. ? Up to 6-8 servings of whole grains each day. ? Less than 6 oz of lean meat, poultry, or fish each day. A 3-oz serving of meat is about the same size as a deck of cards. One egg equals 1 oz. ? 2 servings of low-fat dairy each day. ? A serving of nuts, seeds, or beans 5 times each week. ? Heart-healthy fats. Healthy fats called Omega-3 fatty acids are found in foods such as flaxseeds and coldwater fish, like sardines, salmon, and mackerel.  Limit how much you eat of the following: ? Canned or prepackaged foods. ? Food that is high in trans fat, such as fried foods. ? Food that is high in saturated fat, such as fatty meat. ? Sweets, desserts, sugary drinks, and other foods with added sugar. ? Full-fat dairy products.  Do not salt foods before eating.  Try to eat at least 2 vegetarian meals each week.  Eat more home-cooked food and less restaurant, buffet, and fast food.  When eating at a restaurant, ask that your food be prepared with less salt or no salt, if  possible. What foods are recommended? The items listed may not be a complete list. Talk with your dietitian about what dietary choices are best for you. Grains Whole-grain or whole-wheat bread. Whole-grain or whole-wheat pasta. Brown rice. [place]. Bulgur. Whole-grain and low-sodium cereals. Pita bread. Low-fat, low-sodium crackers. Whole-wheat flour tortillas. Vegetables Fresh or frozen vegetables (raw, steamed, roasted, or grilled). Low-sodium or reduced-sodium tomato and vegetable juice. Low-sodium or reduced-sodium tomato sauce and tomato paste. Low-sodium or reduced-sodium  canned vegetables. Fruits All fresh, dried, or frozen fruit. Canned fruit in natural juice (without added sugar). Meat and other protein foods Skinless chicken or [place]. Ground chicken or [place]. Pork with fat trimmed off. Fish and seafood. Egg whites. Dried beans, peas, or lentils. Unsalted nuts, nut butters, and seeds. Unsalted canned beans. Lean cuts of beef with fat trimmed off. Low-sodium, lean deli meat. Dairy Low-fat (1%) or fat-free (skim) milk. Fat-free, low-fat, or reduced-fat cheeses. Nonfat, low-sodium ricotta or cottage cheese. Low-fat or nonfat yogurt. Low-fat, low-sodium cheese. Fats and oils Soft margarine without trans fats. Vegetable oil. Low-fat, reduced-fat, or light mayonnaise and salad dressings (reduced-sodium). Canola, safflower, olive, soybean, and sunflower oils. Avocado. Seasoning and other foods Herbs. Spices. Seasoning mixes without salt. Unsalted popcorn and pretzels. Fat-free sweets. What foods are not recommended? The items listed may not be a complete list. Talk with your dietitian about what dietary choices are best for you. Grains Baked goods made with fat, such as croissants, muffins, or some breads. Dry pasta or rice meal packs. Vegetables Creamed or fried vegetables. Vegetables in a cheese sauce. Regular canned vegetables (not low-sodium or reduced-sodium). Regular canned tomato sauce and paste (not low-sodium or reduced-sodium). Regular tomato and vegetable juice (not low-sodium or reduced-sodium). Angie Fava. Olives. Fruits Canned fruit in a light or heavy syrup. Fried fruit. Fruit in cream or butter sauce. Meat and other protein foods Fatty cuts of meat. Ribs. Fried meat. Berniece Salines. Sausage. Bologna and other processed lunch meats. Salami. Fatback. Hotdogs. Bratwurst. Salted nuts and seeds. Canned beans with added salt. Canned or smoked fish. Whole eggs or egg yolks. Chicken or [place] with skin. Dairy Whole or 2% milk, cream, and half-and-half. Whole or  full-fat cream cheese. Whole-fat or sweetened yogurt. Full-fat cheese. Nondairy creamers. Whipped toppings. Processed cheese and cheese spreads. Fats and oils Butter. Stick margarine. Lard. Shortening. Ghee. Bacon fat. Tropical oils, such as coconut, palm kernel, or palm oil. Seasoning and other foods Salted popcorn and pretzels. Onion salt, garlic salt, seasoned salt, table salt, and sea salt. Worcestershire sauce. Tartar sauce. Barbecue sauce. Teriyaki sauce. Soy sauce, including reduced-sodium. Steak sauce. Canned and packaged gravies. Fish sauce. Oyster sauce. Cocktail sauce. Horseradish that you find on the shelf. Ketchup. Mustard. Meat flavorings and tenderizers. Bouillon cubes. Hot sauce and Tabasco sauce. Premade or packaged marinades. Premade or packaged taco seasonings. Relishes. Regular salad dressings. Where to find more information:  National Heart, Lung, and [place]: https://wilson-eaton.com/  American Heart Association: www.heart.org Summary  The DASH eating plan is a healthy eating plan that has been shown to reduce high blood pressure (hypertension). It may also reduce your risk for type 2 diabetes, heart disease, and stroke.  With the DASH eating plan, you should limit salt (sodium) intake to 2,300 mg a day. If you have hypertension, you may need to reduce your sodium intake to 1,500 mg a day.  When on the DASH eating plan, aim to eat more fresh fruits and vegetables, whole grains, lean proteins, low-fat dairy, and heart-healthy fats.  Work with your health care provider or diet and nutrition specialist (dietitian) to adjust your eating plan to your individual calorie needs. This information is not intended to replace advice given to you by your health care provider. Make sure you discuss any questions you have with your health care provider. Document Released: 06/15/2011 Document Revised: 06/19/2016 Document Reviewed: 06/19/2016 Elsevier Interactive Patient Education  2017  [employer].

## 2016-12-08 NOTE — Progress Notes (Signed)
Location:  [facility] clinic Provider:  Angelyna Henderson L. Mariea Clonts, D.O., C.M.D.  Code Status: FULL CODE Goals of Care:  Advanced Directives 12/08/2016  Does Patient Have a Medical Advance Directive? No  Would patient like information on creating a medical advance directive? -   Chief Complaint  Patient presents with  . Medical Management of Chronic Issues    2 week follow-up, review labs    HPI: Patient is a 71 y.o. male seen today for medical management of chronic diseases.    BP still high and having headaches from it.  He did have high bp when he was in the [employer] and was on meds, but then he got in better shape, cut down on salt and bp improved.  Does reports still overusing the salt shaker nowadays.  Has eye surgery coming up 6/19 and concerned bp will affect that.  Discussed need to get it lowered and counseled on dietary changes.      Past Medical History:  Diagnosis Date  . Allergic rhinitis due to pollen   . Anxiety state, unspecified   . Asymptomatic varicose veins   . Atherosclerosis of native arteries of the extremities, unspecified   . Cataract 10/17/2016   L Eye  . Contact dermatitis and other eczema due to other chemical products   . 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 Flatulence, eructation, and gas pain   . Headache(784.0)   . Hemorrhoids, external   . Herpes zoster with unspecified complication   . Hyperpotassemia   . Insomnia, unspecified   . Lumbago   . Lumbago   . Myalgia and myositis, unspecified   . Nocturia   . Obesity, unspecified   . Osteoarthrosis, unspecified whether generalized or localized, unspecified site   . Other abnormal blood chemistry   . Other headache syndromes(339.89)   . Pure hyperglyceridemia   . Rash and other nonspecific skin eruption   . Rotator cuff (capsule) sprain   . Routine general medical examination at a health care facility   . Tobacco use disorder   . Unspecified essential hypertension   .  Unspecified hypertrophic and atrophic condition of skin   . Unspecified sleep apnea     Past Surgical History:  Procedure Laterality Date  . HEMORRHOID SURGERY    . VARICOSE VEIN SURGERY      Allergies  Allergen Reactions  . Celebrex [Celecoxib]   . Erythromycin   . Oxycontin [Oxycodone Hcl]   . Penicillins   . Tetracyclines & Related     Allergies as of 12/08/2016      Reactions   Celebrex [celecoxib]    Erythromycin    Oxycontin [oxycodone Hcl]    Penicillins    Tetracyclines & Related       Medication List       Accurate as of 12/08/16  8:42 AM. Always use your most recent med list.          b complex vitamins capsule Take 1 capsule by mouth daily.   loratadine 10 MG tablet Commonly known as:  CLARITIN Take 1 tablet (10 mg total) by mouth daily.   simvastatin 20 MG tablet Commonly known as:  ZOCOR TAKE 1 TABLET BY MOUTH EVERY DAY AT 6 PM       Review of Systems:  Review of Systems  Constitutional: Negative for chills and fever.  HENT: Positive for congestion.   Eyes: Positive for discharge and redness.  Respiratory: Negative for shortness of breath.  Cardiovascular: Negative for chest pain and palpitations.  Gastrointestinal: Negative for abdominal pain, blood in stool, constipation, diarrhea and melena.  Genitourinary: Negative for dysuria.  Musculoskeletal: Negative for back pain, falls and myalgias.  Skin: Negative for itching and rash.  Neurological: Positive for headaches. Negative for weakness.  Endo/Heme/Allergies: Positive for environmental allergies.       HYPERGLYCEMIA  Psychiatric/Behavioral: Negative for depression and memory loss. The patient is nervous/anxious.     Health Maintenance  Topic Date Due  . INFLUENZA VACCINE  02/07/2017  . COLONOSCOPY  08/31/2020  . TETANUS/TDAP  06/14/2023  . Hepatitis C Screening  Completed  . PNA vac Low Risk Adult  Completed    Physical Exam: Vitals:   12/08/16 0825  BP: (!) 152/94  Pulse:  75  Temp: 98.2 F (36.8 C)  TempSrc: Oral  SpO2: 96%  Weight: 225 lb (102.1 kg)  Height: 5\' 10"  (1.778 m)   Body mass index is 32.28 kg/m. Physical Exam  Constitutional: He is oriented to person, place, and time. He appears well-developed and well-nourished.  HENT:  Head: Normocephalic and atraumatic.  Eyes:  Erythema of conjunctiva, clear secretions, pruritis  Neck: Neck supple. No JVD present.  Cardiovascular: Normal rate, regular rhythm, normal heart sounds and intact distal pulses.   Pulmonary/Chest: Effort normal and breath sounds normal. No respiratory distress.  Abdominal: Soft. Bowel sounds are normal. He exhibits no distension. There is no tenderness.  Musculoskeletal: Normal range of motion.  Neurological: He is alert and oriented to person, place, and time.  Skin: Skin is warm and dry. Capillary refill takes less than 2 seconds.  Psychiatric: He has a normal mood and affect.    Labs reviewed: Basic Metabolic Panel:  Recent Labs  02/07/16 0836 08/15/16 0856 11/16/16 0823  NA 138 137 139  K 4.6 4.3 4.8  CL 104 101 106  CO2 24 26 22   GLUCOSE 113* 129* 122*  BUN 14 8 16   CREATININE 0.76 0.69* 0.75  CALCIUM 9.4 9.0 9.1   Liver Function Tests:  Recent Labs  02/07/16 0836 08/15/16 0856 11/16/16 0823  AST 16 14 17   ALT 15 11 13   ALKPHOS 61 60 51  BILITOT 0.3 0.8 0.4  PROT 6.7 7.0 7.0  ALBUMIN 4.0 3.9 4.2   No results for input(s): LIPASE, AMYLASE in the last 8760 hours. No results for input(s): AMMONIA in the last 8760 hours. CBC:  Recent Labs  02/07/16 0836 08/15/16 0856 11/16/16 0823  WBC 7.9 11.0* 6.6  NEUTROABS 4,345 8,140* 3,498  HGB 15.1 14.9 15.4  HCT 45.0 44.9 46.2  MCV 92.0 90.7 91.1  PLT 231 268 236   Lipid Panel:  Recent Labs  05/08/16 0830 08/15/16 0856 11/16/16 0823  CHOL 126 142 111  HDL 24* 32* 29*  LDLCALC 46 82 30  TRIG 281* 139 262*  CHOLHDL 5.3* 4.4 3.8   Lab Results  Component Value Date   HGBA1C 5.7 (H)  11/16/2016    Assessment/Plan 1. Uncontrolled hypertension -counseled extensively today on dietary changes and lowering daily sodium intake to less than 1500mg  per day by avoiding adding salt and reading nutrition labels carefully -advised to try to cut out ramen which he eats a full package which has 1000mg  of sodium just in it (based on research during appt) -eats breakfast and supper only - losartan (COZAAR) 50 MG tablet; Take 1 tablet (50 mg total) by mouth daily.  Dispense: 30 tablet; Refill: 3  Labs/tests ordered:  No new Next  appt:  03/21/2017, also return in 1 week for bp check   Ivory Bail L. Dayja Loveridge, D.O. [facility] [address] [place], [place] [address] Cell Phone (Mon-Fri 8am-5pm):  [phone] On Call:  [phone] & follow prompts after 5pm & weekends Office Phone:  [phone] Office Fax:  [phone]

## 2016-12-12 ENCOUNTER — Encounter: Payer: Self-pay | Admitting: Internal Medicine

## 2016-12-14 ENCOUNTER — Ambulatory Visit: Payer: Medicare Other | Admitting: Internal Medicine

## 2016-12-26 DIAGNOSIS — H25811 Combined forms of age-related cataract, right eye: Secondary | ICD-10-CM | POA: Diagnosis not present

## 2016-12-26 DIAGNOSIS — H2511 Age-related nuclear cataract, right eye: Secondary | ICD-10-CM | POA: Diagnosis not present

## 2017-01-08 ENCOUNTER — Ambulatory Visit (INDEPENDENT_AMBULATORY_CARE_PROVIDER_SITE_OTHER): Payer: Medicare Other | Admitting: Internal Medicine

## 2017-01-08 ENCOUNTER — Encounter: Payer: Self-pay | Admitting: Internal Medicine

## 2017-01-08 VITALS — BP 132/80 | HR 83 | Temp 98.6°F | Wt 224.0 lb

## 2017-01-08 DIAGNOSIS — I10 Essential (primary) hypertension: Secondary | ICD-10-CM

## 2017-01-08 DIAGNOSIS — H1045 Other chronic allergic conjunctivitis: Secondary | ICD-10-CM | POA: Diagnosis not present

## 2017-01-08 DIAGNOSIS — Z9841 Cataract extraction status, right eye: Secondary | ICD-10-CM | POA: Diagnosis not present

## 2017-01-08 DIAGNOSIS — Z961 Presence of intraocular lens: Secondary | ICD-10-CM | POA: Insufficient documentation

## 2017-01-08 NOTE — Progress Notes (Signed)
Location:  [facility] clinic Provider:  Jamariya Davidoff L. Mariea Clonts, D.O., C.M.D.  Goals of Care:  Advanced Directives 12/08/2016  Does Patient Have a Medical Advance Directive? No  Would patient like information on creating a medical advance directive? -   Chief Complaint  Patient presents with  . Follow-up    1 week for BP check    HPI: Patient is a 71 y.o. male seen today for medical management of chronic diseases.    HTN:  He says he does not want to take the medication for the rest of his life.  He has missed it a few days.  BP is much better on it.  Discussed that only if his bp gets better from diet and exercise with lower sodium in the diet.  He is not having dizziness or lightheadedness.   His right eye is recovering well from cataract surgery.    Having increased watery eyes lately.  Taking his loratadine.  Past Medical History:  Diagnosis Date  . Allergic rhinitis due to pollen   . Anxiety state, unspecified   . Asymptomatic varicose veins   . Atherosclerosis of native arteries of the extremities, unspecified   . Cataract 10/17/2016   L Eye  . Contact dermatitis and other eczema due to other chemical products   . 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 Flatulence, eructation, and gas pain   . Headache(784.0)   . Hemorrhoids, external   . Herpes zoster with unspecified complication   . Hyperpotassemia   . Insomnia, unspecified   . Lumbago   . Lumbago   . Myalgia and myositis, unspecified   . Nocturia   . Obesity, unspecified   . Osteoarthrosis, unspecified whether generalized or localized, unspecified site   . Other abnormal blood chemistry   . Other headache syndromes(339.89)   . Pure hyperglyceridemia   . Rash and other nonspecific skin eruption   . Rotator cuff (capsule) sprain   . Routine general medical examination at a health care facility   . Tobacco use disorder   . Unspecified essential hypertension   . Unspecified hypertrophic and  atrophic condition of skin   . Unspecified sleep apnea     Past Surgical History:  Procedure Laterality Date  . HEMORRHOID SURGERY    . VARICOSE VEIN SURGERY      Allergies  Allergen Reactions  . Celebrex [Celecoxib]   . Erythromycin   . Oxycontin [Oxycodone Hcl]   . Penicillins   . Tetracyclines & Related     Allergies as of 01/08/2017      Reactions   Celebrex [celecoxib]    Erythromycin    Oxycontin [oxycodone Hcl]    Penicillins    Tetracyclines & Related       Medication List       Accurate as of 01/08/17  1:19 PM. Always use your most recent med list.          b complex vitamins capsule Take 1 capsule by mouth daily.   loratadine 10 MG tablet Commonly known as:  CLARITIN Take 1 tablet (10 mg total) by mouth daily.   losartan 50 MG tablet Commonly known as:  COZAAR Take 1 tablet (50 mg total) by mouth daily.   simvastatin 20 MG tablet Commonly known as:  ZOCOR TAKE 1 TABLET BY MOUTH EVERY DAY AT 6 PM       Review of Systems:  Review of Systems  Constitutional: Negative for chills, fever and malaise/fatigue.  HENT: Positive for congestion and hearing loss. Negative for sore throat.   Eyes: Negative for blurred vision.       Watery eyes  Respiratory: Negative for shortness of breath.   Cardiovascular: Negative for chest pain, palpitations and leg swelling.  Gastrointestinal: Negative for abdominal pain.  Genitourinary: Negative for dysuria.  Musculoskeletal: Negative for falls and myalgias.  Neurological: Negative for dizziness, loss of consciousness and weakness.  Psychiatric/Behavioral: Negative for depression and memory loss. The patient is not nervous/anxious.     Health Maintenance  Topic Date Due  . INFLUENZA VACCINE  02/07/2017  . COLONOSCOPY  08/31/2020  . TETANUS/TDAP  06/14/2023  . Hepatitis C Screening  Completed  . PNA vac Low Risk Adult  Completed    Physical Exam: Vitals:   01/08/17 1314  BP: 132/80  Pulse: 83  Temp: 98.6  F (37 C)  TempSrc: Oral  SpO2: 96%  Weight: 224 lb (101.6 kg)   Body mass index is 32.14 kg/m. Physical Exam  Constitutional: He is oriented to person, place, and time. He appears well-developed and well-nourished. No distress.  HENT:  Rhinitis, missing several teeth  Eyes:  Red watery eyes  Cardiovascular: Normal rate, regular rhythm, normal heart sounds and intact distal pulses.   Pulmonary/Chest: Effort normal and breath sounds normal. No respiratory distress.  Abdominal: Bowel sounds are normal.  Musculoskeletal: Normal range of motion.  Neurological: He is alert and oriented to person, place, and time.  Skin: Skin is warm and dry.  Psychiatric: He has a normal mood and affect.    Labs reviewed: Basic Metabolic Panel:  Recent Labs  02/07/16 0836 08/15/16 0856 11/16/16 0823  NA 138 137 139  K 4.6 4.3 4.8  CL 104 101 106  CO2 24 26 22   GLUCOSE 113* 129* 122*  BUN 14 8 16   CREATININE 0.76 0.69* 0.75  CALCIUM 9.4 9.0 9.1   Liver Function Tests:  Recent Labs  02/07/16 0836 08/15/16 0856 11/16/16 0823  AST 16 14 17   ALT 15 11 13   ALKPHOS 61 60 51  BILITOT 0.3 0.8 0.4  PROT 6.7 7.0 7.0  ALBUMIN 4.0 3.9 4.2   No results for input(s): LIPASE, AMYLASE in the last 8760 hours. No results for input(s): AMMONIA in the last 8760 hours. CBC:  Recent Labs  02/07/16 0836 08/15/16 0856 11/16/16 0823  WBC 7.9 11.0* 6.6  NEUTROABS 4,345 8,140* 3,498  HGB 15.1 14.9 15.4  HCT 45.0 44.9 46.2  MCV 92.0 90.7 91.1  PLT 231 268 236   Lipid Panel:  Recent Labs  05/08/16 0830 08/15/16 0856 11/16/16 0823  CHOL 126 142 111  HDL 24* 32* 29*  LDLCALC 46 82 30  TRIG 281* 139 262*  CHOLHDL 5.3* 4.4 3.8   Lab Results  Component Value Date   HGBA1C 5.7 (H) 11/16/2016    Assessment/Plan 1. Essential hypertension -bp now controlled with losartan -counseled on DASH diet, handout given and to begin a walking program -he says he'll try to work on the diet, but  does not see himself exercising -also wanted to stop statin b/c he can't have grapefruit with it--advised that a rare grapefruit will simply affect how well the medication works--should not stop it  2. S/P cataract extraction and insertion of intraocular lens, right -doing better with vision since, still using eye drop as directed from Dr. Prudencio Burly  3. Chronic allergic conjunctivitis -cont loratadine and eye drops as per his allergist  Labs/tests ordered:  No orders of  the defined types were placed in this encounter.  Next appt:  03/26/2017  Cezar Misiaszek L. Blane Worthington, D.O. [facility] [address], [place] [address] Cell Phone (Mon-Fri 8am-5pm):  [phone] On Call:  [phone] & follow prompts after 5pm & weekends Office Phone:  [phone] Office Fax:  [phone]

## 2017-01-08 NOTE — Patient Instructions (Signed)
DASH Eating Plan DASH stands for "Dietary Approaches to Stop Hypertension." The DASH eating plan is a healthy eating plan that has been shown to reduce high blood pressure (hypertension). It may also reduce your risk for type 2 diabetes, heart disease, and stroke. The DASH eating plan may also help with weight loss. What are tips for following this plan? General guidelines  Avoid eating more than 2,300 mg (milligrams) of salt (sodium) a day. If you have hypertension, you may need to reduce your sodium intake to 1,500 mg a day.  Limit alcohol intake to no more than 1 drink a day for nonpregnant women and 2 drinks a day for men. One drink equals 12 oz of beer, 5 oz of wine, or 1 oz of hard liquor.  Work with your health care provider to maintain a healthy body weight or to lose weight. Ask what an ideal weight is for you.  Get at least 30 minutes of exercise that causes your heart to beat faster (aerobic exercise) most days of the week. Activities may include walking, swimming, or biking.  Work with your health care provider or diet and nutrition specialist (dietitian) to adjust your eating plan to your individual calorie needs. Reading food labels  Check food labels for the amount of sodium per serving. Choose foods with less than 5 percent of the Daily Value of sodium. Generally, foods with less than 300 mg of sodium per serving fit into this eating plan.  To find whole grains, look for the word "whole" as the first word in the ingredient list. Shopping  Buy products labeled as "low-sodium" or "no salt added."  Buy fresh foods. Avoid canned foods and premade or frozen meals. Cooking  Avoid adding salt when cooking. Use salt-free seasonings or herbs instead of table salt or sea salt. Check with your health care provider or pharmacist before using salt substitutes.  Do not fry foods. Cook foods using healthy methods such as baking, boiling, grilling, and broiling instead.  Cook with  heart-healthy oils, such as olive, canola, soybean, or sunflower oil. Meal planning   Eat a balanced diet that includes: ? 5 or more servings of fruits and vegetables each day. At each meal, try to fill half of your plate with fruits and vegetables. ? Up to 6-8 servings of whole grains each day. ? Less than 6 oz of lean meat, poultry, or fish each day. A 3-oz serving of meat is about the same size as a deck of cards. One egg equals 1 oz. ? 2 servings of low-fat dairy each day. ? A serving of nuts, seeds, or beans 5 times each week. ? Heart-healthy fats. Healthy fats called Omega-3 fatty acids are found in foods such as flaxseeds and coldwater fish, like sardines, salmon, and mackerel.  Limit how much you eat of the following: ? Canned or prepackaged foods. ? Food that is high in trans fat, such as fried foods. ? Food that is high in saturated fat, such as fatty meat. ? Sweets, desserts, sugary drinks, and other foods with added sugar. ? Full-fat dairy products.  Do not salt foods before eating.  Try to eat at least 2 vegetarian meals each week.  Eat more home-cooked food and less restaurant, buffet, and fast food.  When eating at a restaurant, ask that your food be prepared with less salt or no salt, if possible. What foods are recommended? The items listed may not be a complete list. Talk with your dietitian about what   dietary choices are best for you. Grains Whole-grain or whole-wheat bread. Whole-grain or whole-wheat pasta. Brown rice. Oatmeal. Quinoa. Bulgur. Whole-grain and low-sodium cereals. Pita bread. Low-fat, low-sodium crackers. Whole-wheat flour tortillas. Vegetables Fresh or frozen vegetables (raw, steamed, roasted, or grilled). Low-sodium or reduced-sodium tomato and vegetable juice. Low-sodium or reduced-sodium tomato sauce and tomato paste. Low-sodium or reduced-sodium canned vegetables. Fruits All fresh, dried, or frozen fruit. Canned fruit in natural juice (without  added sugar). Meat and other protein foods Skinless chicken or turkey. Ground chicken or turkey. Pork with fat trimmed off. Fish and seafood. Egg whites. Dried beans, peas, or lentils. Unsalted nuts, nut butters, and seeds. Unsalted canned beans. Lean cuts of beef with fat trimmed off. Low-sodium, lean deli meat. Dairy Low-fat (1%) or fat-free (skim) milk. Fat-free, low-fat, or reduced-fat cheeses. Nonfat, low-sodium ricotta or cottage cheese. Low-fat or nonfat yogurt. Low-fat, low-sodium cheese. Fats and oils Soft margarine without trans fats. Vegetable oil. Low-fat, reduced-fat, or light mayonnaise and salad dressings (reduced-sodium). Canola, safflower, olive, soybean, and sunflower oils. Avocado. Seasoning and other foods Herbs. Spices. Seasoning mixes without salt. Unsalted popcorn and pretzels. Fat-free sweets. What foods are not recommended? The items listed may not be a complete list. Talk with your dietitian about what dietary choices are best for you. Grains Baked goods made with fat, such as croissants, muffins, or some breads. Dry pasta or rice meal packs. Vegetables Creamed or fried vegetables. Vegetables in a cheese sauce. Regular canned vegetables (not low-sodium or reduced-sodium). Regular canned tomato sauce and paste (not low-sodium or reduced-sodium). Regular tomato and vegetable juice (not low-sodium or reduced-sodium). Pickles. Olives. Fruits Canned fruit in a light or heavy syrup. Fried fruit. Fruit in cream or butter sauce. Meat and other protein foods Fatty cuts of meat. Ribs. Fried meat. Bacon. Sausage. Bologna and other processed lunch meats. Salami. Fatback. Hotdogs. Bratwurst. Salted nuts and seeds. Canned beans with added salt. Canned or smoked fish. Whole eggs or egg yolks. Chicken or turkey with skin. Dairy Whole or 2% milk, cream, and half-and-half. Whole or full-fat cream cheese. Whole-fat or sweetened yogurt. Full-fat cheese. Nondairy creamers. Whipped toppings.  Processed cheese and cheese spreads. Fats and oils Butter. Stick margarine. Lard. Shortening. Ghee. Bacon fat. Tropical oils, such as coconut, palm kernel, or palm oil. Seasoning and other foods Salted popcorn and pretzels. Onion salt, garlic salt, seasoned salt, table salt, and sea salt. Worcestershire sauce. Tartar sauce. Barbecue sauce. Teriyaki sauce. Soy sauce, including reduced-sodium. Steak sauce. Canned and packaged gravies. Fish sauce. Oyster sauce. Cocktail sauce. Horseradish that you find on the shelf. Ketchup. Mustard. Meat flavorings and tenderizers. Bouillon cubes. Hot sauce and Tabasco sauce. Premade or packaged marinades. Premade or packaged taco seasonings. Relishes. Regular salad dressings. Where to find more information:  National Heart, Lung, and Blood Institute: www.nhlbi.nih.gov  American Heart Association: www.heart.org Summary  The DASH eating plan is a healthy eating plan that has been shown to reduce high blood pressure (hypertension). It may also reduce your risk for type 2 diabetes, heart disease, and stroke.  With the DASH eating plan, you should limit salt (sodium) intake to 2,300 mg a day. If you have hypertension, you may need to reduce your sodium intake to 1,500 mg a day.  When on the DASH eating plan, aim to eat more fresh fruits and vegetables, whole grains, lean proteins, low-fat dairy, and heart-healthy fats.  Work with your health care provider or diet and nutrition specialist (dietitian) to adjust your eating plan to your individual   calorie needs. This information is not intended to replace advice given to you by your health care provider. Make sure you discuss any questions you have with your health care provider. Document Released: 06/15/2011 Document Revised: 06/19/2016 Document Reviewed: 06/19/2016 Elsevier Interactive Patient Education  2017 Elsevier Inc.  

## 2017-01-16 ENCOUNTER — Other Ambulatory Visit: Payer: Self-pay | Admitting: Internal Medicine

## 2017-03-15 DIAGNOSIS — Z85820 Personal history of malignant melanoma of skin: Secondary | ICD-10-CM | POA: Diagnosis not present

## 2017-03-15 DIAGNOSIS — L814 Other melanin hyperpigmentation: Secondary | ICD-10-CM | POA: Diagnosis not present

## 2017-03-15 DIAGNOSIS — D225 Melanocytic nevi of trunk: Secondary | ICD-10-CM | POA: Diagnosis not present

## 2017-03-15 DIAGNOSIS — D485 Neoplasm of uncertain behavior of skin: Secondary | ICD-10-CM | POA: Diagnosis not present

## 2017-03-15 DIAGNOSIS — L821 Other seborrheic keratosis: Secondary | ICD-10-CM | POA: Diagnosis not present

## 2017-03-15 DIAGNOSIS — L439 Lichen planus, unspecified: Secondary | ICD-10-CM | POA: Diagnosis not present

## 2017-03-15 DIAGNOSIS — L910 Hypertrophic scar: Secondary | ICD-10-CM | POA: Diagnosis not present

## 2017-03-21 ENCOUNTER — Other Ambulatory Visit: Payer: Medicare Other

## 2017-03-23 ENCOUNTER — Ambulatory Visit: Payer: Medicare Other | Admitting: Internal Medicine

## 2017-03-26 ENCOUNTER — Other Ambulatory Visit: Payer: Medicare Other

## 2017-03-26 DIAGNOSIS — E781 Pure hyperglyceridemia: Secondary | ICD-10-CM | POA: Diagnosis not present

## 2017-03-26 DIAGNOSIS — R739 Hyperglycemia, unspecified: Secondary | ICD-10-CM | POA: Diagnosis not present

## 2017-03-27 LAB — LIPID PANEL
Cholesterol: 110 mg/dL (ref <200)
HDL: 27 mg/dL — ABNORMAL LOW (ref >40)
LDL Cholesterol (Calc): 55 mg/dL (calc)
Non-HDL Cholesterol (Calc): 83 mg/dL (calc) (ref <130)
Total CHOL/HDL Ratio: 4.1 (calc) (ref <5.0)
Triglycerides: 228 mg/dL — ABNORMAL HIGH (ref <150)

## 2017-03-27 LAB — HEMOGLOBIN A1C
Hgb A1c MFr Bld: 5.8 % of total Hgb — ABNORMAL HIGH (ref <5.7)
Mean Plasma Glucose: 120 (calc)
eAG (mmol/L): 6.6 (calc)

## 2017-03-28 ENCOUNTER — Other Ambulatory Visit: Payer: Self-pay | Admitting: Internal Medicine

## 2017-03-28 DIAGNOSIS — I10 Essential (primary) hypertension: Secondary | ICD-10-CM

## 2017-03-29 ENCOUNTER — Ambulatory Visit (INDEPENDENT_AMBULATORY_CARE_PROVIDER_SITE_OTHER): Payer: Medicare Other | Admitting: Internal Medicine

## 2017-03-29 ENCOUNTER — Encounter: Payer: Self-pay | Admitting: Internal Medicine

## 2017-03-29 VITALS — BP 120/70 | HR 93 | Temp 98.8°F | Wt 227.0 lb

## 2017-03-29 DIAGNOSIS — E782 Mixed hyperlipidemia: Secondary | ICD-10-CM | POA: Diagnosis not present

## 2017-03-29 DIAGNOSIS — F321 Major depressive disorder, single episode, moderate: Secondary | ICD-10-CM

## 2017-03-29 DIAGNOSIS — F99 Mental disorder, not otherwise specified: Secondary | ICD-10-CM

## 2017-03-29 DIAGNOSIS — F5105 Insomnia due to other mental disorder: Secondary | ICD-10-CM

## 2017-03-29 DIAGNOSIS — I10 Essential (primary) hypertension: Secondary | ICD-10-CM

## 2017-03-29 MED ORDER — FLUOXETINE HCL 40 MG PO CAPS: 40.0000 mg | ORAL_CAPSULE | Freq: Every day | ORAL | 3 refills | Status: DC

## 2017-03-29 MED ORDER — BUPROPION HCL ER (XL) 300 MG PO TB24: 300.0000 mg | ORAL_TABLET | Freq: Every day | ORAL | 3 refills | Status: DC

## 2017-03-29 NOTE — Progress Notes (Signed)
Location:  [facility] clinic Provider:  Abeera Flannery L. Mariea Clonts, D.O., C.M.D.  Code Status: full code Goals of Care:  Advanced Directives 12/08/2016  Does Patient Have a Medical Advance Directive? No  Would patient like information on creating a medical advance directive? -   Chief Complaint  Patient presents with  . Medical Management of Chronic Issues    64mth follow-up    HPI: Patient is a 71 y.o. male seen today for medical management of chronic diseases.    Says he's getting agitated and upset.  His wife called to check on him.  Not sleeping well either.  Still taking melatonin, but it doesn't kick in at the right time.  He's getting shaky.  He went to the [place] and was told he had PTSD and that he could have been possibly exposed to [place] in [place].  He does not like talking about it b/c he tears up when he has to talk about it.  See PHQ-9--scored 10/15 on it.  He suppresses what bothers him. Agrees to restart wellbutrin.  Says his memory is getting worse.  He's having trouble keeping up with bills and can't afford to pay them.  Discussed waiting till next week for flu shot since he had shingrix #2 yesterday.  Off cigarettes since January 2017.  Blood pressure at goal.    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 Allergic rhinitis due to pollen   . Anxiety state, unspecified   . Asymptomatic varicose veins   . Atherosclerosis of native arteries of the extremities, unspecified   . Cataract 10/17/2016   L Eye  . Contact dermatitis and other eczema due to other chemical products   . 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 Flatulence, eructation, and gas pain   . Headache(784.0)   . Hemorrhoids, external   . Herpes zoster with unspecified complication   . Hyperpotassemia   . Insomnia, unspecified   . Lumbago   . Lumbago   . Myalgia and myositis, unspecified   .  Nocturia   . Obesity, unspecified   . Osteoarthrosis, unspecified whether generalized or localized, unspecified site   . Other abnormal blood chemistry   . Other headache syndromes(339.89)   . Pure hyperglyceridemia   . Rash and other nonspecific skin eruption   . Rotator cuff (capsule) sprain   . Routine general medical examination at a health care facility   . Tobacco use disorder   . Unspecified essential hypertension   . Unspecified hypertrophic and atrophic condition of skin   . Unspecified sleep apnea     Past Surgical History:  Procedure Laterality Date  . HEMORRHOID SURGERY    . VARICOSE VEIN SURGERY      Allergies  Allergen Reactions  . Celebrex [Celecoxib]   . Erythromycin   . Oxycontin [Oxycodone Hcl]   . Penicillins   . Tetracyclines & Related     Outpatient Encounter Prescriptions as of 03/29/2017  Medication Sig  . b complex vitamins capsule Take 1 capsule by mouth daily.  Marland Kitchen loratadine (CLARITIN) 10 MG tablet TAKE 1 TABLET(10 MG) BY MOUTH DAILY  . losartan (COZAAR) 50 MG tablet TAKE 1 TABLET(50 MG) BY MOUTH DAILY  . simvastatin (ZOCOR) 20 MG tablet TAKE 1 TABLET BY MOUTH EVERY DAY AT 6 PM   No facility-administered encounter medications on file as of 03/29/2017.  Review of Systems:  Review of Systems  Constitutional: Negative for chills and fever.  HENT: Positive for hearing loss. Negative for congestion.   Eyes: Negative for blurred vision.  Respiratory: Negative for shortness of breath.   Cardiovascular: Negative for chest pain, palpitations and leg swelling.  Gastrointestinal: Negative for abdominal pain, blood in stool, constipation and melena.  Genitourinary: Negative for dysuria.  Musculoskeletal: Negative for joint pain.  Skin: Negative for itching and rash.  Neurological: Negative for dizziness and loss of consciousness.  Psychiatric/Behavioral: Positive for depression and memory loss. The patient is nervous/anxious and has insomnia.      Health Maintenance  Topic Date Due  . INFLUENZA VACCINE  02/07/2017  . COLONOSCOPY  08/31/2020  . TETANUS/TDAP  06/14/2023  . Hepatitis C Screening  Completed  . PNA vac Low Risk Adult  Completed    Physical Exam: Vitals:   03/29/17 1302  BP: 120/70  Pulse: 93  Temp: 98.8 F (37.1 C)  TempSrc: Oral  SpO2: 94%  Weight: 227 lb (103 kg)   Body mass index is 32.57 kg/m. Physical Exam  Constitutional: He is oriented to person, place, and time. He appears well-developed and well-nourished. No distress.  Cardiovascular: Normal rate, regular rhythm, normal heart sounds and intact distal pulses.   Pulmonary/Chest: Effort normal and breath sounds normal. No respiratory distress.  Abdominal: Bowel sounds are normal.  Musculoskeletal: Normal range of motion.  Neurological: He is alert and oriented to person, place, and time.  Skin: Skin is warm and dry.  Psychiatric:  Very agitated and yelling at his wife who called to check if he was done with his appt 12 minutes after the scheduled time, near tears when talking to me, tremulous    Labs reviewed: Basic Metabolic Panel:  Recent Labs  08/15/16 0856 11/16/16 0823  NA 137 139  K 4.3 4.8  CL 101 106  CO2 26 22  GLUCOSE 129* 122*  BUN 8 16  CREATININE 0.69* 0.75  CALCIUM 9.0 9.1   Liver Function Tests:  Recent Labs  08/15/16 0856 11/16/16 0823  AST 14 17  ALT 11 13  ALKPHOS 60 51  BILITOT 0.8 0.4  PROT 7.0 7.0  ALBUMIN 3.9 4.2   No results for input(s): LIPASE, AMYLASE in the last 8760 hours. No results for input(s): AMMONIA in the last 8760 hours. CBC:  Recent Labs  08/15/16 0856 11/16/16 0823  WBC 11.0* 6.6  NEUTROABS 8,140* 3,498  HGB 14.9 15.4  HCT 44.9 46.2  MCV 90.7 91.1  PLT 268 236   Lipid Panel:  Recent Labs  05/08/16 0830 08/15/16 0856 11/16/16 0823 03/26/17 0853  CHOL 126 142 111 110  HDL 24* 32* 29* 27*  LDLCALC 46 82 30  --   TRIG 281* 139 262* 228*  CHOLHDL 5.3* 4.4 3.8 4.1    Lab Results  Component Value Date   HGBA1C 5.8 (H) 03/26/2017    Assessment/Plan 1. Depression, major, single episode, moderate ([place]) - much worse since off effexor and wellbutrin -was on effexor most recently but doing great with mood so we'd tapered him off (also cost issues), but did have problems with BP on SNRI - FLUoxetine (PROZAC) 40 MG capsule; Take 1 capsule (40 mg total) by mouth daily.  Dispense: 30 capsule; Refill: 3  2. Mixed hyperlipidemia - cont simvastatin and f/u labs - CBC with Differential/Platelet; Future - Basic metabolic panel; Future - Lipid panel; Future  3. Essential hypertension -bp at goal with losartan 50mg   daily - Basic metabolic panel; Future  4. Insomnia due to other mental disorder -start prozac in the am to help with his mood in hopes he'll also sleep better -recommended PTSD counseling at [place] but he does not like talking about [place] so I doubt he'll go  Labs/tests ordered:   Orders Placed This Encounter  Procedures  . CBC with Differential/Platelet    Standing Status:   Future    Standing Expiration Date:   11/26/2017  . Basic metabolic panel    Standing Status:   Future    Standing Expiration Date:   11/26/2017    Order Specific Question:   Has the patient fasted?    Answer:   Yes  . Lipid panel    Standing Status:   Future    Standing Expiration Date:   11/26/2017    Order Specific Question:   Has the patient fasted?    Answer:   Yes    Next appt: 07/16/2017 med mgt, labs before  Kodie Pick L. Maijor Hornig, D.O. [facility] [address], [place] [address] Cell Phone (Mon-Fri 8am-5pm):  [phone] On Call:  [phone] & follow prompts after 5pm & weekends Office Phone:  [phone] Office Fax:  [phone]

## 2017-04-03 ENCOUNTER — Ambulatory Visit (INDEPENDENT_AMBULATORY_CARE_PROVIDER_SITE_OTHER): Payer: Medicare Other

## 2017-04-03 DIAGNOSIS — Z23 Encounter for immunization: Secondary | ICD-10-CM

## 2017-05-02 DIAGNOSIS — H1045 Other chronic allergic conjunctivitis: Secondary | ICD-10-CM | POA: Diagnosis not present

## 2017-05-02 DIAGNOSIS — J309 Allergic rhinitis, unspecified: Secondary | ICD-10-CM | POA: Diagnosis not present

## 2017-05-14 ENCOUNTER — Other Ambulatory Visit: Payer: Self-pay | Admitting: Internal Medicine

## 2017-05-14 DIAGNOSIS — E781 Pure hyperglyceridemia: Secondary | ICD-10-CM

## 2017-05-14 DIAGNOSIS — R739 Hyperglycemia, unspecified: Secondary | ICD-10-CM

## 2017-06-10 ENCOUNTER — Other Ambulatory Visit: Payer: Self-pay | Admitting: Internal Medicine

## 2017-06-10 DIAGNOSIS — I10 Essential (primary) hypertension: Secondary | ICD-10-CM

## 2017-07-13 ENCOUNTER — Other Ambulatory Visit: Payer: Medicare Other

## 2017-07-13 DIAGNOSIS — E782 Mixed hyperlipidemia: Secondary | ICD-10-CM | POA: Diagnosis not present

## 2017-07-13 DIAGNOSIS — I10 Essential (primary) hypertension: Secondary | ICD-10-CM | POA: Diagnosis not present

## 2017-07-13 LAB — CBC WITH DIFFERENTIAL/PLATELET
Basophils Absolute: 83 cells/uL (ref 0–200)
Basophils Relative: 1.2 %
Eosinophils Absolute: 200 cells/uL (ref 15–500)
Eosinophils Relative: 2.9 %
HCT: 44.8 % (ref 38.5–50.0)
Hemoglobin: 15 g/dL (ref 13.2–17.1)
Lymphs Abs: 2063 cells/uL (ref 850–3900)
MCH: 30.2 pg (ref 27.0–33.0)
MCHC: 33.5 g/dL (ref 32.0–36.0)
MCV: 90.1 fL (ref 80.0–100.0)
MPV: 11.8 fL (ref 7.5–12.5)
Monocytes Relative: 11.6 %
Neutro Abs: 3754 cells/uL (ref 1500–7800)
Neutrophils Relative %: 54.4 %
Platelets: 232 10*3/uL (ref 140–400)
RBC: 4.97 10*6/uL (ref 4.20–5.80)
RDW: 12.6 % (ref 11.0–15.0)
Total Lymphocyte: 29.9 %
WBC mixed population: 800 cells/uL (ref 200–950)
WBC: 6.9 10*3/uL (ref 3.8–10.8)

## 2017-07-13 LAB — LIPID PANEL
Cholesterol: 135 mg/dL (ref <200)
HDL: 29 mg/dL — ABNORMAL LOW (ref >40)
LDL Cholesterol (Calc): 70 mg/dL (calc)
Non-HDL Cholesterol (Calc): 106 mg/dL (calc) (ref <130)
Total CHOL/HDL Ratio: 4.7 (calc) (ref <5.0)
Triglycerides: 300 mg/dL — ABNORMAL HIGH (ref <150)

## 2017-07-13 LAB — BASIC METABOLIC PANEL
BUN/Creatinine Ratio: 19 (calc) (ref 6–22)
BUN: 13 mg/dL (ref 7–25)
CO2: 27 mmol/L (ref 20–32)
Calcium: 9.4 mg/dL (ref 8.6–10.3)
Chloride: 104 mmol/L (ref 98–110)
Creat: 0.69 mg/dL — ABNORMAL LOW (ref 0.70–1.18)
Glucose, Bld: 118 mg/dL — ABNORMAL HIGH (ref 65–99)
Potassium: 4.6 mmol/L (ref 3.5–5.3)
Sodium: 137 mmol/L (ref 135–146)

## 2017-07-16 ENCOUNTER — Ambulatory Visit (INDEPENDENT_AMBULATORY_CARE_PROVIDER_SITE_OTHER): Payer: Medicare Other | Admitting: Internal Medicine

## 2017-07-16 ENCOUNTER — Encounter: Payer: Self-pay | Admitting: Internal Medicine

## 2017-07-16 VITALS — BP 150/90 | HR 65 | Temp 97.6°F | Wt 230.0 lb

## 2017-07-16 DIAGNOSIS — I10 Essential (primary) hypertension: Secondary | ICD-10-CM | POA: Diagnosis not present

## 2017-07-16 DIAGNOSIS — E781 Pure hyperglyceridemia: Secondary | ICD-10-CM | POA: Diagnosis not present

## 2017-07-16 DIAGNOSIS — Z9109 Other allergy status, other than to drugs and biological substances: Secondary | ICD-10-CM

## 2017-07-16 DIAGNOSIS — R739 Hyperglycemia, unspecified: Secondary | ICD-10-CM | POA: Diagnosis not present

## 2017-07-16 DIAGNOSIS — F5105 Insomnia due to other mental disorder: Secondary | ICD-10-CM | POA: Diagnosis not present

## 2017-07-16 DIAGNOSIS — F331 Major depressive disorder, recurrent, moderate: Secondary | ICD-10-CM

## 2017-07-16 DIAGNOSIS — F99 Mental disorder, not otherwise specified: Secondary | ICD-10-CM

## 2017-07-16 DIAGNOSIS — E669 Obesity, unspecified: Secondary | ICD-10-CM

## 2017-07-16 MED ORDER — SIMVASTATIN 20 MG PO TABS: ORAL_TABLET | ORAL | 3 refills | Status: DC

## 2017-07-16 MED ORDER — LORATADINE 10 MG PO TABS: 10.0000 mg | ORAL_TABLET | Freq: Every day | ORAL | 3 refills | Status: DC | PRN

## 2017-07-16 MED ORDER — LOSARTAN POTASSIUM 50 MG PO TABS: 50.0000 mg | ORAL_TABLET | Freq: Every day | ORAL | 3 refills | Status: DC

## 2017-07-16 MED ORDER — TRAZODONE HCL 50 MG PO TABS: 50.0000 mg | ORAL_TABLET | Freq: Every day | ORAL | 3 refills | Status: DC

## 2017-07-16 NOTE — Patient Instructions (Signed)
Please try to eat larger portions of salad and smaller portions of pasta, ramen, and sandwiches.

## 2017-07-16 NOTE — Progress Notes (Signed)
Location:  [facility] clinic Provider:  Concetta Guion L. Mariea Clonts, D.O., C.M.D.  Code Status: full code--need to discuss with him Goals of Care:  Advanced Directives 12/08/2016  Does Patient Have a Medical Advance Directive? No  Would patient like information on creating a medical advance directive? -     Chief Complaint  Patient presents with  . Medical Management of Chronic Issues    follow-up, stopped prozac    HPI: Patient is a 72 y.o. David [name] seen today for medical management of chronic diseases.    Still feeling down.  Took the prozac for David days, but could not tell a difference so he quit taking it.   Still having some bad days were he's grumpy and frustrated with everything.  Says he's older and more in debt.  Says if he could get more sleep, he'd probably feel better, but he tosses and turns a few hours before he can go to sleep.  Falls asleep for 15-20 min intervals while watching tv.  He says he's claustrophobic and refuses sleep apnea testing b/c he can't wear the mask.  He says he doesn't stop breathing.  He says it did happen twice in his life and had to sit up on the edge of the bed.  He can only sleep in the fetal position.  Continues to have financial issues.    He's been eating more rice lately and his TG are trending up.  He also loads it with soy sauce (high sodium).  He's not eating healthy veggies.  He's eating ramen and sandwiches.    He has not taken his bp meds or eaten breakfast today.  He is stressed and anxious about his wife's memory loss.    Past Medical History:  Diagnosis Date  . Allergic rhinitis due to pollen   . Anxiety state, unspecified   . Asymptomatic varicose veins   . Atherosclerosis of native arteries of the extremities, unspecified   . Cataract 10/17/2016   L Eye  . Contact dermatitis and other eczema due to other chemical products   . 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 Flatulence, eructation, and gas pain   .  Headache(784.0)   . Hemorrhoids, external   . Herpes zoster with unspecified complication   . Hyperpotassemia   . Insomnia, unspecified   . Lumbago   . Lumbago   . Myalgia and myositis, unspecified   . Nocturia   . Obesity, unspecified   . Osteoarthrosis, unspecified whether generalized or localized, unspecified site   . Other abnormal blood chemistry   . Other headache syndromes(339.89)   . Pure hyperglyceridemia   . Rash and other nonspecific skin eruption   . Rotator cuff (capsule) sprain   . Routine general medical examination at a health care facility   . Tobacco use disorder   . Unspecified essential hypertension   . Unspecified hypertrophic and atrophic condition of skin   . Unspecified sleep apnea     Past Surgical History:  Procedure Laterality Date  . HEMORRHOID SURGERY    . VARICOSE VEIN SURGERY      Allergies  Allergen Reactions  . Celebrex [Celecoxib]   . Erythromycin   . Oxycontin [Oxycodone Hcl]   . Penicillins   . Tetracyclines & Related     Outpatient Encounter Medications as of 07/16/2017  Medication Sig  . b complex vitamins capsule Take 1 capsule by mouth daily.  Marland Kitchen loratadine (CLARITIN) 10 MG tablet Take 1 tablet (10 mg  total) by mouth daily as needed for allergies.  Marland Kitchen losartan (COZAAR) 50 MG tablet Take 1 tablet (50 mg total) by mouth daily. For blood pressure  . simvastatin (ZOCOR) 20 MG tablet TAKE 1 TABLET BY MOUTH EVERY DAY AT 6 PM for cholesterol  . [DISCONTINUED] loratadine (CLARITIN) 10 MG tablet TAKE 1 TABLET(10 MG) BY MOUTH DAILY  . [DISCONTINUED] losartan (COZAAR) 50 MG tablet TAKE 1 TABLET(50 MG) BY MOUTH DAILY  . [DISCONTINUED] simvastatin (ZOCOR) 20 MG tablet TAKE 1 TABLET BY MOUTH EVERY DAY AT 6 PM  . traZODone (DESYREL) 50 MG tablet Take 1 tablet (50 mg total) by mouth at bedtime. For sleep and depression  . [DISCONTINUED] FLUoxetine (PROZAC) 40 MG capsule Take 1 capsule (40 mg total) by mouth daily.  . [DISCONTINUED] traZODone  (DESYREL) 50 MG tablet Take 1 tablet (50 mg total) by mouth at bedtime.   No facility-administered encounter medications on file as of 07/16/2017.     Review of Systems:  Review of Systems  Constitutional: Negative for chills, fever and malaise/fatigue.  HENT: Positive for hearing loss. Negative for congestion.   Eyes: Negative for blurred vision and redness.       Glasses  Respiratory: Negative for shortness of breath.   Cardiovascular: Negative for chest pain, palpitations and leg swelling.  Gastrointestinal: Negative for abdominal pain, blood in stool, constipation, diarrhea and melena.  Genitourinary: Negative for dysuria.  Musculoskeletal: Negative for falls and joint pain.  Skin: Negative for itching and rash.  Neurological: Negative for dizziness, loss of consciousness and weakness.  Endo/Heme/Allergies: Positive for environmental allergies.  Psychiatric/Behavioral: Positive for depression. The patient is nervous/anxious and has insomnia.     Health Maintenance  Topic Date Due  . COLONOSCOPY  08/31/2020  . TETANUS/TDAP  06/14/2023  . INFLUENZA VACCINE  Completed  . Hepatitis C Screening  Completed  . PNA vac Low Risk Adult  Completed    Physical Exam: Vitals:   07/16/17 0837  BP: (!) 150/90  Pulse: 65  Temp: 97.6 F (36.4 C)  TempSrc: Oral  SpO2: 97%  Weight: 230 lb (104.3 kg)   Body mass index is 33 kg/m. Physical Exam  Constitutional: He is oriented to person, place, and time. He appears well-developed and well-nourished. No distress.  Obese white David [name], sleeping and snoring when I entered room, startled awake when I said his name  HENT:  Head: Normocephalic and atraumatic.  Cardiovascular: Normal rate, regular rhythm, normal heart sounds and intact distal pulses.  Pulmonary/Chest: Effort normal and breath sounds normal. He has no wheezes.  Abdominal: Bowel sounds are normal.  Central obesity  Musculoskeletal: Normal range of motion.  Neurological: He is  alert and oriented to person, place, and time.  Skin: Skin is warm and dry.  Psychiatric: He has a normal mood and affect.    Labs reviewed: Basic Metabolic Panel: Recent Labs    08/15/16 0856 11/16/16 0823 07/13/17 0827  NA 137 139 137  K 4.3 4.8 4.6  CL 101 106 104  CO2 26 22 27   GLUCOSE 129* 122* 118*  BUN 8 16 13   CREATININE 0.69* 0.75 0.69*  CALCIUM 9.0 9.1 9.4   Liver Function Tests: Recent Labs    08/15/16 0856 11/16/16 0823  AST 14 17  ALT 11 13  ALKPHOS David 51  BILITOT 0.8 0.4  PROT 7.0 7.0  ALBUMIN 3.9 4.2   No results for input(s): LIPASE, AMYLASE in the last 8760 hours. No results for input(s): AMMONIA in the  last 8760 hours. CBC: Recent Labs    08/15/16 0856 11/16/16 0823 07/13/17 0827  WBC 11.0* 6.6 6.9  NEUTROABS 8,140* 3,498 3,754  HGB 14.9 15.4 15.0  HCT 44.9 46.2 44.8  MCV 90.7 91.1 90.1  PLT 268 236 232   Lipid Panel: Recent Labs    08/15/16 0856 11/16/16 0823 03/26/17 0853 07/13/17 0827  CHOL 142 111 110 135  HDL 32* 29* 27* 29*  LDLCALC 82 30  --   --   TRIG 139 262* 228* 300*  CHOLHDL 4.4 3.8 4.1 4.7   Lab Results  Component Value Date   HGBA1C 5.8 (H) 03/26/2017    Assessment/Plan 1. 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 Moderate episode of recurrent major depressive disorder (B[place]) - stopped prozac due to David days of use w/o benefit - traZODone (DESYREL) 50 MG tablet; Take 1 tablet (50 mg total) by mouth at bedtime. For sleep and depression  Dispense: 90 tablet; Refill: 3  3. Hypertriglyceridemia - cont statin therapy, needs to  work on his diet--discussed cutting down on ramen, rice, pasta and eating larger salad portions - Lipid panel; Future - simvastatin (ZOCOR) 20 MG tablet; TAKE 1 TABLET BY MOUTH EVERY DAY AT 6 PM for cholesterol  Dispense: 90 tablet; Refill: 3  4. Obesity (BMI 30-39.9) -ongoing, wt up 3 lbs since last visit -counseled on dietary changes, refuses exercise outright  5. Uncontrolled hypertension - ongoing, but did not take meds and eating VERY high sodium foods like ramen - losartan (COZAAR) 50 MG tablet; Take 1 tablet (50 mg total) by mouth daily. For blood pressure  Dispense: 90 tablet; Refill: 3  6. Hyperglycemia - cont to work on diet, f/u labs next time to see if he improves - Hemoglobin A1c; Future - simvastatin (ZOCOR) 20 MG tablet; TAKE 1 TABLET BY MOUTH EVERY DAY AT 6 PM for cholesterol  Dispense: 90 tablet; Refill: 3  7. Environmental allergies - cont claritin - loratadine (CLARITIN) 10 MG tablet; Take 1 tablet (10 mg total) by mouth daily as needed for allergies.  Dispense: 90 tablet; Refill: 3  Labs/tests ordered:   Orders Placed This Encounter  Procedures  . Hemoglobin A1c    Standing Status:   Future    Standing Expiration Date:   03/16/2018  . Lipid panel    Standing Status:   Future    Standing Expiration Date:   03/16/2018    Next appt:  11/12/2017 med mgt, labs before, but call if has financial or other issue with the trazodone  Cleotha Tsang L. Raychell Holcomb, D.O. [facility] [address], [place] [address] Cell Phone (Mon-Fri 8am-5pm):  [phone] On Call:  [phone] & follow prompts after 5pm & weekends Office Phone:  [phone] Office Fax:  [phone]

## 2017-09-12 DIAGNOSIS — L821 Other seborrheic keratosis: Secondary | ICD-10-CM | POA: Diagnosis not present

## 2017-09-12 DIAGNOSIS — Z85820 Personal history of malignant melanoma of skin: Secondary | ICD-10-CM | POA: Diagnosis not present

## 2017-09-12 DIAGNOSIS — L218 Other seborrheic dermatitis: Secondary | ICD-10-CM | POA: Diagnosis not present

## 2017-09-12 DIAGNOSIS — L570 Actinic keratosis: Secondary | ICD-10-CM | POA: Diagnosis not present

## 2017-09-12 DIAGNOSIS — Z872 Personal history of diseases of the skin and subcutaneous tissue: Secondary | ICD-10-CM | POA: Diagnosis not present

## 2017-09-12 DIAGNOSIS — L720 Epidermal cyst: Secondary | ICD-10-CM | POA: Diagnosis not present

## 2017-09-12 DIAGNOSIS — L910 Hypertrophic scar: Secondary | ICD-10-CM | POA: Diagnosis not present

## 2017-09-12 DIAGNOSIS — L820 Inflamed seborrheic keratosis: Secondary | ICD-10-CM | POA: Diagnosis not present

## 2017-10-08 ENCOUNTER — Other Ambulatory Visit: Payer: Self-pay | Admitting: *Deleted

## 2017-10-08 DIAGNOSIS — R739 Hyperglycemia, unspecified: Secondary | ICD-10-CM

## 2017-10-08 DIAGNOSIS — E781 Pure hyperglyceridemia: Secondary | ICD-10-CM

## 2017-10-08 MED ORDER — SIMVASTATIN 20 MG PO TABS: ORAL_TABLET | ORAL | 3 refills | Status: DC

## 2017-10-08 NOTE — Telephone Encounter (Signed)
[employer]

## 2017-11-06 ENCOUNTER — Encounter: Payer: Self-pay | Admitting: Nurse Practitioner

## 2017-11-06 ENCOUNTER — Ambulatory Visit (INDEPENDENT_AMBULATORY_CARE_PROVIDER_SITE_OTHER): Payer: Medicare Other | Admitting: Nurse Practitioner

## 2017-11-06 VITALS — BP 160/88 | HR 73 | Temp 97.8°F | Ht 70.0 in | Wt 227.0 lb

## 2017-11-06 DIAGNOSIS — J0140 Acute pansinusitis, unspecified: Secondary | ICD-10-CM

## 2017-11-06 DIAGNOSIS — J302 Other seasonal allergic rhinitis: Secondary | ICD-10-CM | POA: Diagnosis not present

## 2017-11-06 NOTE — Patient Instructions (Signed)
neti pot twice daily- this is over the counter  To use FLONASE 1 spray into both nares twice daily for nasal congestion - this is over the counter  To start (Cetirizine) zyrtec 10 mg by mouth daily for allergies instead of Claritin -this is over the counter  Plain nasal saline spray throughout the day as needed May use tylenol 325 mg 2 tablets every 6 hours as needed aches and pains or sore throat humidifier in the home to help with the dry air Mucinex DM by mouth twice daily for 7 days then as needed for cough and congestion with full glass of water -- this is over the counter  Keep well hydrated Avoid forcefully blowing nose    Sinusitis, Adult Sinusitis is soreness and inflammation of your sinuses. Sinuses are hollow spaces in the bones around your face. Your sinuses are located:  Around your eyes.  In the middle of your forehead.  Behind your nose.  In your cheekbones.  Your sinuses and nasal passages are lined with a stringy fluid (mucus). Mucus normally drains out of your sinuses. When your nasal tissues become inflamed or swollen, the mucus can become trapped or blocked so air cannot flow through your sinuses. This allows bacteria, viruses, and funguses to grow, which leads to infection. Sinusitis can develop quickly and last for 7?10 days (acute) or for more than 12 weeks (chronic). Sinusitis often develops after a cold. What are the causes? This condition is caused by anything that creates swelling in the sinuses or stops mucus from draining, including:  Allergies.  Asthma.  Bacterial or viral infection.  Abnormally shaped bones between the nasal passages.  Nasal growths that contain mucus (nasal polyps).  Narrow sinus openings.  Pollutants, such as chemicals or irritants in the air.  A foreign object stuck in the nose.  A fungal infection. This is rare.  What increases the risk? The following factors may make you more likely to develop this  condition:  Having allergies or asthma.  Having had a recent cold or respiratory tract infection.  Having structural deformities or blockages in your nose or sinuses.  Having a weak immune system.  Doing a lot of swimming or diving.  Overusing nasal sprays.  Smoking.  What are the signs or symptoms? The main symptoms of this condition are pain and a feeling of pressure around the affected sinuses. Other symptoms include:  Upper toothache.  Earache.  Headache.  Bad breath.  Decreased sense of smell and taste.  A cough that may get worse at night.  Fatigue.  Fever.  Thick drainage from your nose. The drainage is often green and it may contain pus (purulent).  Stuffy nose or congestion.  Postnasal drip. This is when extra mucus collects in the throat or back of the nose.  Swelling and warmth over the affected sinuses.  Sore throat.  Sensitivity to light.  How is this diagnosed? This condition is diagnosed based on symptoms, a medical history, and a physical exam. To find out if your condition is acute or chronic, your health care provider may:  Look in your nose for signs of nasal polyps.  Tap over the affected sinus to check for signs of infection.  View the inside of your sinuses using an imaging device that has a light attached (endoscope).  If your health care provider suspects that you have chronic sinusitis, you may also:  Be tested for allergies.  Have a sample of mucus taken from your nose (nasal  culture) and checked for bacteria.  Have a mucus sample examined to see if your sinusitis is related to an allergy.  If your sinusitis does not respond to treatment and it lasts longer than 8 weeks, you may have an MRI or CT scan to check your sinuses. These scans also help to determine how severe your infection is. In rare cases, a bone biopsy may be done to rule out more serious types of fungal sinus disease. How is this treated? Treatment for  sinusitis depends on the cause and whether your condition is chronic or acute. If a virus is causing your sinusitis, your symptoms will go away on their own within 10 days. You may be given medicines to relieve your symptoms, including:  Topical nasal decongestants. They shrink swollen nasal passages and let mucus drain from your sinuses.  Antihistamines. These drugs block inflammation that is triggered by allergies. This can help to ease swelling in your nose and sinuses.  Topical nasal corticosteroids. These are nasal sprays that ease inflammation and swelling in your nose and sinuses.  Nasal saline washes. These rinses can help to get rid of thick mucus in your nose.  If your condition is caused by bacteria, you will be given an antibiotic medicine. If your condition is caused by a fungus, you will be given an antifungal medicine. Surgery may be needed to correct underlying conditions, such as narrow nasal passages. Surgery may also be needed to remove polyps. Follow these instructions at home: Medicines  Take, use, or apply over-the-counter and prescription medicines only as told by your health care provider. These may include nasal sprays.  If you were prescribed an antibiotic medicine, take it as told by your health care provider. Do not stop taking the antibiotic even if you start to feel better. Hydrate and Humidify  Drink enough water to keep your urine clear or pale yellow. Staying hydrated will help to thin your mucus.  Use a cool mist humidifier to keep the humidity level in your home above 50%.  Inhale steam for 10-15 minutes, 3-4 times a day or as told by your health care provider. You can do this in the bathroom while a hot shower is running.  Limit your exposure to cool or dry air. Rest  Rest as much as possible.  Sleep with your head raised (elevated).  Make sure to get enough sleep each night. General instructions  Apply a warm, moist washcloth to your face 3-4  times a day or as told by your health care provider. This will help with discomfort.  Wash your hands often with soap and water to reduce your exposure to viruses and other germs. If soap and water are not available, use hand sanitizer.  Do not smoke. Avoid being around people who are smoking (secondhand smoke).  Keep all follow-up visits as told by your health care provider. This is important. Contact a health care provider if:  You have a fever.  Your symptoms get worse.  Your symptoms do not improve within 10 days. Get help right away if:  You have a severe headache.  You have persistent vomiting.  You have pain or swelling around your face or eyes.  You have vision problems.  You develop confusion.  Your neck is stiff.  You have trouble breathing. This information is not intended to replace advice given to you by your health care provider. Make sure you discuss any questions you have with your health care provider. Document Released: 06/26/2005 Document  Revised: 02/20/2016 Document Reviewed: 04/21/2015 Elsevier Interactive Patient Education  [employer].

## 2017-11-06 NOTE — Progress Notes (Signed)
Careteam: Patient Care Team: Gayland Curry, DO as PCP - General (Geriatric Medicine) Katy Apo, MD as Consulting Physician (Ophthalmology)  Advanced Directive information    Allergies  Allergen Reactions  . Celebrex [Celecoxib]   . Erythromycin   . Oxycontin [Oxycodone Hcl]   . Penicillins   . Tetracyclines & Related     Chief Complaint  Patient presents with  . Acute Visit    Pt is being seen due to sinus pain/pressure, coughing up mucus, sneezing, SOB, fever/chills for about a week.      HPI: Patient is a 72 y.o. male seen in the office today due to having a hard time breathing for 1 week.  Coughing with chest and nasal congestion for 10 days.  Coughing up clear/white/green sputum.  Having anxiety when he lays down to go to sleep because he can not breath.  Having a splitting headache above the eyes.  Does not take temperature at home but will get a chill and break out into sweats.  Eating and drinking normally No energy.  Generally has allergies Has taken loratadine 10 mg daily and taking advil gel to help ease his pain    Review of Systems:  Review of Systems  Constitutional: Positive for chills and malaise/fatigue. Negative for fever.       Reports sweats, has not taken temperature  HENT: Positive for congestion and sinus pain (above eyes). Negative for ear pain, hearing loss, sore throat and tinnitus.        Scratchy throat  Respiratory: Positive for cough, sputum production and shortness of breath. Negative for wheezing.   Cardiovascular: Negative for chest pain and palpitations.  Neurological: Positive for weakness and headaches.  Endo/Heme/Allergies: Positive for environmental allergies.    Past Medical History:  Diagnosis Date  . Allergic rhinitis due to pollen   . Anxiety state, unspecified   . Asymptomatic varicose veins   . Atherosclerosis of native arteries of the extremities, unspecified   . Cataract 10/17/2016   L Eye  . Contact  dermatitis and other eczema due to other chemical products   . 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 Flatulence, eructation, and gas pain   . Headache(784.0)   . Hemorrhoids, external   . Herpes zoster with unspecified complication   . Hyperpotassemia   . Insomnia, unspecified   . Lumbago   . Lumbago   . Myalgia and myositis, unspecified   . Nocturia   . Obesity, unspecified   . Osteoarthrosis, unspecified whether generalized or localized, unspecified site   . Other abnormal blood chemistry   . Other headache syndromes(339.89)   . Pure hyperglyceridemia   . Rash and other nonspecific skin eruption   . Rotator cuff (capsule) sprain   . Routine general medical examination at a health care facility   . Tobacco use disorder   . Unspecified essential hypertension   . Unspecified hypertrophic and atrophic condition of skin   . Unspecified sleep apnea    Past Surgical History:  Procedure Laterality Date  . HEMORRHOID SURGERY    . VARICOSE VEIN SURGERY     Social History:   reports that he quit smoking about 2 years ago. His smoking use included cigarettes. He has a 82.50 pack-year smoking history. He has never used smokeless tobacco. He reports that he does not drink alcohol or use drugs.  Family History  Problem Relation Age of Onset  . Diabetes Mother   . Heart disease Mother   .  Stroke Mother   . Stroke Father     Medications: Patient's Medications  New Prescriptions   No medications on file  Previous Medications   B COMPLEX VITAMINS CAPSULE    Take 1 capsule by mouth daily.   LORATADINE (CLARITIN) 10 MG TABLET    Take 1 tablet (10 mg total) by mouth daily as needed for allergies.   LOSARTAN (COZAAR) 50 MG TABLET    Take 1 tablet (50 mg total) by mouth daily. For blood pressure   SIMVASTATIN (ZOCOR) 20 MG TABLET    Take one tablet by mouth once daily at 6pm for cholesterol   TRAZODONE (DESYREL) 50 MG TABLET    Take 1 tablet (50 mg total) by  mouth at bedtime. For sleep and depression  Modified Medications   No medications on file  Discontinued Medications   No medications on file     Physical Exam:  Vitals:   11/06/17 1419  BP: (!) 160/88  Pulse: 73  Temp: 97.8 F (36.6 C)  TempSrc: Oral  SpO2: 96%  Weight: 227 lb (103 kg)  Height: 5\' 10"  (1.778 m)   Body mass index is 32.57 kg/m.  Physical Exam  Constitutional: He is oriented to person, place, and time. He appears well-developed and well-nourished. No distress.  HENT:  Head: Normocephalic and atraumatic.  Right Ear: External ear normal.  Left Ear: External ear normal.  Nose: Mucosal edema and rhinorrhea present.  Mouth/Throat: Oropharynx is clear and moist. No oropharyngeal exudate.  Eyes: Pupils are equal, round, and reactive to light. EOM are normal.  Red watery eyes  Neck: Neck supple.  Cardiovascular: Normal rate, regular rhythm and normal heart sounds.  Pulmonary/Chest: Effort normal and breath sounds normal. No respiratory distress.  Abdominal: Bowel sounds are normal.  Musculoskeletal: Normal range of motion.  Lymphadenopathy:    He has no cervical adenopathy.  Neurological: He is alert and oriented to person, place, and time.  Skin: Skin is warm and dry.  Psychiatric: He has a normal mood and affect.    Labs reviewed: Basic Metabolic Panel: Recent Labs    11/16/16 0823 07/13/17 0827  NA 139 137  K 4.8 4.6  CL 106 104  CO2 22 27  GLUCOSE 122* 118*  BUN 16 13  CREATININE 0.75 0.69*  CALCIUM 9.1 9.4   Liver Function Tests: Recent Labs    11/16/16 0823  AST 17  ALT 13  ALKPHOS 51  BILITOT 0.4  PROT 7.0  ALBUMIN 4.2   No results for input(s): LIPASE, AMYLASE in the last 8760 hours. No results for input(s): AMMONIA in the last 8760 hours. CBC: Recent Labs    11/16/16 0823 07/13/17 0827  WBC 6.6 6.9  NEUTROABS 3,498 3,754  HGB 15.4 15.0  HCT 46.2 44.8  MCV 91.1 90.1  PLT 236 232   Lipid Panel: Recent Labs     11/16/16 0823 03/26/17 0853 07/13/17 0827  CHOL 111 110 135  HDL 29* 27* 29*  LDLCALC 30 55 70  TRIG 262* 228* 300*  CHOLHDL 3.8 4.1 4.7   TSH: No results for input(s): TSH in the last 8760 hours. A1C: Lab Results  Component Value Date   HGBA1C 5.8 (H) 03/26/2017     Assessment/Plan 1. Seasonal allergies -to start cetirizine 10 mg daily (vs Claritin) Flonase 1 spray into both nares twice daily   2. Acute non-recurrent pansinusitis -to use supportive measures at this time. If symptoms persist or worsen will consider antibiotic, pt with multiple  allergies to antibiotics -neti pot twice daily Plain nasal saline spray throughout the day as needed May use tylenol 325 mg 2 tablets every 6 hours as needed aches and pains or sore throat humidifier in the home to help with the dry air Mucinex DM by mouth twice daily as needed for cough and congestion with full glass of water  Keep well hydrated Avoid forcefully blowing nose  Follow up precautions given  Mickie Kozikowski K. [place], [facility] [phone]

## 2017-11-08 ENCOUNTER — Other Ambulatory Visit: Payer: Medicare Other

## 2017-11-09 ENCOUNTER — Other Ambulatory Visit: Payer: Medicare Other

## 2017-11-09 DIAGNOSIS — R739 Hyperglycemia, unspecified: Secondary | ICD-10-CM | POA: Diagnosis not present

## 2017-11-09 DIAGNOSIS — E781 Pure hyperglyceridemia: Secondary | ICD-10-CM | POA: Diagnosis not present

## 2017-11-09 LAB — LIPID PANEL
Cholesterol: 125 mg/dL (ref <200)
HDL: 26 mg/dL — ABNORMAL LOW (ref >40)
LDL Cholesterol (Calc): 77 mg/dL (calc)
Non-HDL Cholesterol (Calc): 99 mg/dL (calc) (ref <130)
Total CHOL/HDL Ratio: 4.8 (calc) (ref <5.0)
Triglycerides: 132 mg/dL (ref <150)

## 2017-11-10 ENCOUNTER — Emergency Department (HOSPITAL_COMMUNITY)
Admission: EM | Admit: 2017-11-10 | Discharge: 2017-11-11 | Disposition: A | Discharge status: Home / Self Care | Payer: Medicare Other | Attending: Emergency Medicine | Admitting: Emergency Medicine

## 2017-11-10 ENCOUNTER — Encounter (HOSPITAL_COMMUNITY): Payer: Self-pay | Admitting: *Deleted

## 2017-11-10 ENCOUNTER — Emergency Department (HOSPITAL_COMMUNITY): Payer: Medicare Other

## 2017-11-10 ENCOUNTER — Other Ambulatory Visit: Payer: Self-pay

## 2017-11-10 DIAGNOSIS — I10 Essential (primary) hypertension: Secondary | ICD-10-CM | POA: Insufficient documentation

## 2017-11-10 DIAGNOSIS — R0602 Shortness of breath: Secondary | ICD-10-CM | POA: Diagnosis not present

## 2017-11-10 DIAGNOSIS — J0100 Acute maxillary sinusitis, unspecified: Secondary | ICD-10-CM | POA: Insufficient documentation

## 2017-11-10 DIAGNOSIS — R05 Cough: Secondary | ICD-10-CM | POA: Diagnosis not present

## 2017-11-10 DIAGNOSIS — J441 Chronic obstructive pulmonary disease with (acute) exacerbation: Secondary | ICD-10-CM | POA: Insufficient documentation

## 2017-11-10 DIAGNOSIS — Z87891 Personal history of nicotine dependence: Secondary | ICD-10-CM | POA: Insufficient documentation

## 2017-11-10 DIAGNOSIS — Z79899 Other long term (current) drug therapy: Secondary | ICD-10-CM | POA: Diagnosis not present

## 2017-11-10 LAB — BASIC METABOLIC PANEL
Anion gap: 11 (ref 5–15)
BUN: 11 mg/dL (ref 6–20)
CHLORIDE: 99 mmol/L — AB (ref 101–111)
CO2: 26 mmol/L (ref 22–32)
Calcium: 9.1 mg/dL (ref 8.9–10.3)
Creatinine, Ser: 0.87 mg/dL (ref 0.61–1.24)
GFR calc Af Amer: >60 mL/min (ref >60)
GLUCOSE: 121 mg/dL — AB (ref 65–99)
Potassium: 4.4 mmol/L (ref 3.5–5.1)
Sodium: 136 mmol/L (ref 135–145)

## 2017-11-10 LAB — HEMOGLOBIN A1C
Hgb A1c MFr Bld: 5.9 % of total Hgb — ABNORMAL HIGH (ref <5.7)
Mean Plasma Glucose: 123 (calc)
eAG (mmol/L): 6.8 (calc)

## 2017-11-10 LAB — I-STAT TROPONIN, ED: Troponin i, poc: 0 ng/mL (ref 0.00–0.08)

## 2017-11-10 LAB — CBC
HEMATOCRIT: 42 % (ref 39.0–52.0)
Hemoglobin: 14 g/dL (ref 13.0–17.0)
MCH: 31 pg (ref 26.0–34.0)
MCHC: 33.3 g/dL (ref 30.0–36.0)
MCV: 92.9 fL (ref 78.0–100.0)
Platelets: 315 10*3/uL (ref 150–400)
RBC: 4.52 MIL/uL (ref 4.22–5.81)
RDW: 13.3 % (ref 11.5–15.5)
WBC: 11.5 10*3/uL — ABNORMAL HIGH (ref 4.0–10.5)

## 2017-11-10 MED ORDER — ALBUTEROL SULFATE (2.5 MG/3ML) 0.083% IN NEBU
5.0000 mg | INHALATION_SOLUTION | Freq: Once | RESPIRATORY_TRACT | Status: AC
  Administered 2017-11-10: 5 mg via RESPIRATORY_TRACT
  Filled 2017-11-10: qty 6

## 2017-11-10 NOTE — ED Triage Notes (Signed)
Pt has been having sob x 10 days, worse over the past 4 days. Unable to lie flat and has had difficulty sleeping. Has had a productive cough with green phlegm.

## 2017-11-11 ENCOUNTER — Other Ambulatory Visit: Payer: Self-pay

## 2017-11-11 DIAGNOSIS — J441 Chronic obstructive pulmonary disease with (acute) exacerbation: Secondary | ICD-10-CM | POA: Diagnosis not present

## 2017-11-11 MED ORDER — ALBUTEROL SULFATE HFA 108 (90 BASE) MCG/ACT IN AERS: 1.0000 | INHALATION_SPRAY | Freq: Four times a day (QID) | RESPIRATORY_TRACT | 0 refills | Status: DC | PRN

## 2017-11-11 MED ORDER — ALBUTEROL (5 MG/ML) CONTINUOUS INHALATION SOLN
10.0000 mg/h | INHALATION_SOLUTION | Freq: Once | RESPIRATORY_TRACT | Status: AC
  Administered 2017-11-11: 10 mg/h via RESPIRATORY_TRACT
  Filled 2017-11-11: qty 20

## 2017-11-11 MED ORDER — LEVOFLOXACIN 750 MG PO TABS
750.0000 mg | ORAL_TABLET | Freq: Once | ORAL | Status: AC
  Administered 2017-11-11: 750 mg via ORAL
  Filled 2017-11-11: qty 1

## 2017-11-11 MED ORDER — PREDNISONE 20 MG PO TABS
60.0000 mg | ORAL_TABLET | Freq: Once | ORAL | Status: AC
  Administered 2017-11-11: 60 mg via ORAL
  Filled 2017-11-11: qty 3

## 2017-11-11 MED ORDER — PREDNISONE 20 MG PO TABS: 20.0000 mg | ORAL_TABLET | Freq: Two times a day (BID) | ORAL | 0 refills | Status: DC

## 2017-11-11 MED ORDER — LEVOFLOXACIN 500 MG PO TABS: 500.0000 mg | ORAL_TABLET | Freq: Every day | ORAL | 0 refills | Status: DC

## 2017-11-11 NOTE — ED Provider Notes (Signed)
[facility] EMERGENCY DEPARTMENT Provider Note   CSN: [id number] Arrival date & time: 11/10/17  2157     History   Chief Complaint Chief Complaint  Patient presents with  . Shortness of Breath    HPI David Meskill. is a 72 y.o. male.  Chief complaint is upper respiratory infection, cough, difficulty breathing  HPI: 72 year old male.  History of tobacco abuse.  Stopped smoking 2 years ago.  Uses inhalers at home.  Is not on home O2.  Describes nasal congestion, nasal discharge, and productive cough for last 4 to 5 days.  No fever.  Some dyspnea.  Some dyspnea with exertion.  No chest pain.  No edema.  Past Medical History:  Diagnosis Date  . Allergic rhinitis due to pollen   . Anxiety state, unspecified   . Asymptomatic varicose veins   . Atherosclerosis of native arteries of the extremities, unspecified   . Cataract 10/17/2016   L Eye  . Contact dermatitis and other eczema due to other chemical products   . 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 Flatulence, eructation, and gas pain   . Headache(784.0)   . Hemorrhoids, external   . Herpes zoster with unspecified complication   . Hyperpotassemia   . Insomnia, unspecified   . Lumbago   . Lumbago   . Myalgia and myositis, unspecified   . Nocturia   . Obesity, unspecified   . Osteoarthrosis, unspecified whether generalized or localized, unspecified site   . Other abnormal blood chemistry   . Other headache syndromes(339.89)   . Pure hyperglyceridemia   . Rash and other nonspecific skin eruption   . Rotator cuff (capsule) sprain   . Routine general medical examination at a health care facility   . Tobacco use disorder   . Unspecified essential hypertension   . Unspecified hypertrophic and atrophic condition of skin   . Unspecified sleep apnea     Patient Active Problem List   Diagnosis Date Noted  . S/P cataract extraction and insertion of intraocular lens, right  01/08/2017  . Environmental allergies 11/08/2015  . Seasonal allergies 11/08/2015  . Encounter for smoking cessation counseling 11/08/2015  . Obesity (BMI 30-39.9) 12/22/2013  . 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  . Hypertriglyceridemia 09/11/2013  . Depression 09/11/2013  . Insomnia 09/11/2013  . Generalized anxiety disorder 09/11/2013  . Dry mouth 09/11/2013  . Hyperglycemia 09/11/2013  . Mixed hyperlipidemia 10/28/2012  . Tobacco use disorder 10/28/2012  . Essential hypertension 10/28/2012  . 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 Obesity, unspecified 10/28/2012    Past Surgical History:  Procedure Laterality Date  . HEMORRHOID SURGERY    . VARICOSE VEIN SURGERY          Home Medications    Prior to Admission medications   Medication Sig Start Date End Date Taking? Authorizing Provider  loratadine (CLARITIN) 10 MG tablet Take 1 tablet (10 mg total) by mouth daily as needed for allergies. 07/16/17  Yes Reed, Tiffany L, DO  losartan (COZAAR) 50 MG tablet Take 1 tablet (50 mg total) by mouth daily. For blood pressure 07/16/17  Yes Reed, Tiffany L, DO  simvastatin (ZOCOR) 20 MG tablet Take one tablet by mouth once daily at 6pm for cholesterol 10/08/17  Yes Reed, Tiffany L, DO  traZODone (DESYREL) 50 MG tablet Take 1 tablet (50 mg total) by mouth at bedtime. For sleep and depression 07/16/17  Yes Reed, Tiffany L, DO  albuterol (  PROVENTIL HFA;VENTOLIN HFA) 108 (90 Base) MCG/ACT inhaler Inhale 1-2 puffs into the lungs every 6 (six) hours as needed for wheezing. 11/11/17   Tanna Furry, MD  levofloxacin (LEVAQUIN) 500 MG tablet Take 1 tablet (500 mg total) by mouth daily. 11/11/17   Tanna Furry, MD  predniSONE (DELTASONE) 20 MG tablet Take 1 tablet (20 mg total) by mouth 2 (two) times daily with a meal. 11/11/17   Tanna Furry, MD    Family History Family History  Problem Relation Age of Onset  . Diabetes Mother   . Heart disease Mother   . Stroke Mother   . Stroke  Father     Social History Social History   Tobacco Use  . Smoking status: Former Smoker    Packs/day: 1.50    Years: 55.00    Pack years: 82.50    Types: Cigarettes    Last attempt to quit: 07/30/2015    Years since quitting: 2.2  . Smokeless tobacco: Never Used  . Tobacco comment: pt thinks increased effexor dose helped him to quit  Substance Use Topics  . Alcohol use: No    Alcohol/week: 0.0 oz  . Drug use: No     Allergies   Celebrex [celecoxib]; Erythromycin; Oxycontin [oxycodone hcl]; Penicillins; and Tetracyclines & related   Review of Systems Review of Systems  Constitutional: Negative for appetite change, chills, diaphoresis, fatigue and fever.  HENT: Positive for congestion, postnasal drip, rhinorrhea, sinus pressure and sinus pain. Negative for mouth sores, sore throat and trouble swallowing.   Eyes: Negative for visual disturbance.  Respiratory: Positive for cough and shortness of breath. Negative for chest tightness and wheezing.   Cardiovascular: Negative for chest pain.  Gastrointestinal: Negative for abdominal distention, abdominal pain, diarrhea, nausea and vomiting.  Endocrine: Negative for polydipsia, polyphagia and polyuria.  Genitourinary: Negative for dysuria, frequency and hematuria.  Musculoskeletal: Negative for gait problem.  Skin: Negative for color change, pallor and rash.  Neurological: Negative for dizziness, syncope, light-headedness and headaches.  Hematological: Does not bruise/bleed easily.  Psychiatric/Behavioral: Negative for behavioral problems and confusion.     Physical Exam Updated Vital Signs BP 124/70 (BP Location: Right Arm)   Pulse (!) 104   Temp 98.5 F (36.9 C) (Oral)   Resp 18   Ht 5\' 10"  (1.778 m)   Wt 102.1 kg (225 lb)   SpO2 95%   BMI 32.28 kg/m   Physical Exam  Constitutional: He is oriented to person, place, and time. He appears well-developed and well-nourished. No distress.  Anxious.  Pacing.  States  hospitals "given her claustrophobia.  HENT:  Head: Normocephalic.  Nasal mucosal congestion.  Some drainage in the posterior pharynx.  Eyes: Pupils are equal, round, and reactive to light. Conjunctivae are normal. No scleral icterus.  Neck: Normal range of motion. Neck supple. No thyromegaly present.  Cardiovascular: Normal rate and regular rhythm. Exam reveals no gallop and no friction rub.  No murmur heard. Pulmonary/Chest: Effort normal and breath sounds normal. No respiratory distress. He has no wheezes. He has no rales.  Patient has had one breathing treatment.  He has faint end expiratory wheezing.  These improve his lungs clear after a second.  Abdominal: Soft. Bowel sounds are normal. He exhibits no distension. There is no tenderness. There is no rebound.  Musculoskeletal: Normal range of motion.  Neurological: He is alert and oriented to person, place, and time.  Skin: Skin is warm and dry. No rash noted.     ED Treatments /  Results  Labs (all labs ordered are listed, but only abnormal results are displayed) Labs Reviewed  BASIC METABOLIC PANEL - Abnormal; Notable for the following components:      Result Value   Chloride 99 (*)    Glucose, Bld 121 (*)    All other components within normal limits  CBC - Abnormal; Notable for the following components:   WBC 11.5 (*)    All other components within normal limits  I-STAT TROPONIN, ED    EKG EKG Interpretation  Date/Time:  Saturday Nov 10 2017 22:02:56 EDT Ventricular Rate:  113 PR Interval:  154 QRS Duration: 104 QT Interval:  354 QTC Calculation: 485 R Axis:   32 Text Interpretation:  Sinus tachycardia Possible Lateral infarct , age undetermined Inferior infarct , age undetermined Abnormal ECG Confirmed by Tanna Furry [phone]) on 11/11/2017 1:13:27 AM   Radiology Dg Chest 2 View  Result Date: 11/10/2017 CLINICAL DATA:  72 year old male with shortness of breath. EXAM: CHEST - 2 VIEW COMPARISON:  Chest radiograph dated  04/25/2005 FINDINGS: There is emphysematous changes of the lungs with diffuse interstitial coarsening and bibasilar atelectatic changes. Mild diffuse interstitial nodularity noted which may be chronic or represent an atypical infection. Clinical correlation is recommended. There is no focal consolidation, pleural effusion, or pneumothorax. The cardiac silhouette is within normal limits. The aorta is tortuous. No acute osseous pathology. IMPRESSION: Emphysema with mild chronic interstitial coarsening and nodularity. Pneumonia is not excluded. Clinical correlation is recommended. Electronically Signed   By: Anner Crete M.D.   On: 11/10/2017 22:46    Procedures Procedures (including critical care time)  Medications Ordered in ED Medications  albuterol (PROVENTIL) (2.5 MG/3ML) 0.083% nebulizer solution 5 mg (5 mg Nebulization Given 11/10/17 2211)  albuterol (PROVENTIL,VENTOLIN) solution continuous neb (10 mg/hr Nebulization Given 11/11/17 0219)  predniSONE (DELTASONE) tablet 60 mg (60 mg Oral Given 11/11/17 0158)  levofloxacin (LEVAQUIN) tablet 750 mg (750 mg Oral Given 11/11/17 0158)     Initial Impression / Assessment and Plan / ED Course  I have reviewed the triage vital signs and the nursing notes.  Pertinent labs & imaging results that were available during my care of the patient were reviewed by me and considered in my medical decision making (see chart for details).     EKG Interpretation  Date/Time:  Saturday Nov 10 2017 22:02:56 EDT Ventricular Rate:  113 PR Interval:  154 QRS Duration: 104 QT Interval:  354 QTC Calculation: 485 R Axis:   32 Text Interpretation:  Sinus tachycardia Possible Lateral infarct , age undetermined Inferior infarct , age undetermined Abnormal ECG Confirmed by Tanna Furry [phone]) on 11/11/2017 1:13:27 AM  Chest x-ray shows no acute ab normalities.  No concerning findings on labs.  Given p.o. prednisone.  He has multiple allergies.  Levaquin would seem to be  the only appropriate antibiotic for pulmonary, and sinus coverage.  Discharge home.  Albuterol, prednisone, Levaquin, primary care follow-up.  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

## 2017-11-11 NOTE — Discharge Instructions (Addendum)
Inhaler as needed. Levaquin antibiotic.  Prednisone, steroid.  Recheck with your primary care physician if not improving

## 2017-11-11 NOTE — ED Notes (Signed)
ED Provider at bedside. 

## 2017-11-12 ENCOUNTER — Ambulatory Visit (INDEPENDENT_AMBULATORY_CARE_PROVIDER_SITE_OTHER): Payer: Medicare Other | Admitting: Internal Medicine

## 2017-11-12 ENCOUNTER — Encounter: Payer: Self-pay | Admitting: Internal Medicine

## 2017-11-12 VITALS — BP 128/80 | HR 78 | Temp 97.7°F | Ht 70.0 in | Wt 218.0 lb

## 2017-11-12 DIAGNOSIS — R739 Hyperglycemia, unspecified: Secondary | ICD-10-CM

## 2017-11-12 DIAGNOSIS — J441 Chronic obstructive pulmonary disease with (acute) exacerbation: Secondary | ICD-10-CM | POA: Diagnosis not present

## 2017-11-12 DIAGNOSIS — E669 Obesity, unspecified: Secondary | ICD-10-CM | POA: Diagnosis not present

## 2017-11-12 DIAGNOSIS — C439 Malignant melanoma of skin, unspecified: Secondary | ICD-10-CM

## 2017-11-12 DIAGNOSIS — E781 Pure hyperglyceridemia: Secondary | ICD-10-CM | POA: Diagnosis not present

## 2017-11-12 DIAGNOSIS — I10 Essential (primary) hypertension: Secondary | ICD-10-CM | POA: Diagnosis not present

## 2017-11-12 DIAGNOSIS — J0140 Acute pansinusitis, unspecified: Secondary | ICD-10-CM

## 2017-11-12 NOTE — Patient Instructions (Signed)
Inhaling hot steam will loosen the mucus. Also drink plenty of water. Complete your full course of antibiotics (levaquin) and prednisone for your sinus infection with COPD exacerbation. Use the albuterol inhaler to help the breathing.

## 2017-11-12 NOTE — Progress Notes (Signed)
Location:  [facility] clinic Provider:  Sabreen Kitchen L. Mariea Clonts, D.O., C.M.D.  Code Status: Full code Goals of Care:  Advanced Directives 11/12/2017  Does Patient Have a Medical Advance Directive? No  Would patient like information on creating a medical advance directive? No - Patient declined   Chief Complaint  Patient presents with  . Medical Management of Chronic Issues    76mth follow-up, ER follow-up    HPI: Patient is a 72 y.o. male seen today for medical management of chronic diseases.    Says he started getting sick near the end of last month.  He was coughing and spitting up phlegm.  It got to where it was hard for him to breathe.  No fevers, but has had chills.  His sputum is greenish-yellow. Mucus from the nose is the same way. Started in his head and went to his chest.  No longer smokes.  He's starting to feel better after his second dose of antibiotics.  He had leukocytosis in the ED of 11.5.  He was given levaquin, prednisone and an albuterol inhaler.  Unfortunately, even albuterol costs him $50some dollars even with [facility].  This is why he's not on regular inhalers--he cannot afford his medications.  He has not otherwise changed his routine.  His triglycerides are improved, but LDL and glucose slightly up.    His sleep is poor despite trazodone right now b/c he cannot breathe through his nose and there's constant pressure in his sinuses.  BP is actually at goal today with his cozaar.    Allergies remain an issue on claritin.  He does not continue his eye drops and meds per allergist consistently also due to cost.  He has no new lesions around his melanoma site or other locations.  He reports that it remains thickened at either of the scar where he had the resection on his chest wall.  Past Medical History:  Diagnosis Date  . Allergic rhinitis due to pollen   . Anxiety state, unspecified   . Asymptomatic varicose veins   . Atherosclerosis of native arteries of the extremities,  unspecified   . Cataract 10/17/2016   L Eye  . Contact dermatitis and other eczema due to other chemical products   . 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 Flatulence, eructation, and gas pain   . Headache(784.0)   . Hemorrhoids, external   . Herpes zoster with unspecified complication   . Hyperpotassemia   . Insomnia, unspecified   . Lumbago   . Lumbago   . Myalgia and myositis, unspecified   . Nocturia   . Obesity, unspecified   . Osteoarthrosis, unspecified whether generalized or localized, unspecified site   . Other abnormal blood chemistry   . Other headache syndromes(339.89)   . Pure hyperglyceridemia   . Rash and other nonspecific skin eruption   . Rotator cuff (capsule) sprain   . Routine general medical examination at a health care facility   . Tobacco use disorder   . Unspecified essential hypertension   . Unspecified hypertrophic and atrophic condition of skin   . Unspecified sleep apnea     Past Surgical History:  Procedure Laterality Date  . HEMORRHOID SURGERY    . VARICOSE VEIN SURGERY      Allergies  Allergen Reactions  . Celebrex [Celecoxib]   . Erythromycin   . Oxycontin [Oxycodone Hcl]   . Penicillins   . Tetracyclines & Related     Outpatient Encounter Medications as  of 11/12/2017  Medication Sig  . albuterol (PROVENTIL HFA;VENTOLIN HFA) 108 (90 Base) MCG/ACT inhaler Inhale 1-2 puffs into the lungs every 6 (six) hours as needed for wheezing.  Marland Kitchen levofloxacin (LEVAQUIN) 500 MG tablet Take 1 tablet (500 mg total) by mouth daily.  Marland Kitchen loratadine (CLARITIN) 10 MG tablet Take 1 tablet (10 mg total) by mouth daily as needed for allergies.  Marland Kitchen losartan (COZAAR) 50 MG tablet Take 1 tablet (50 mg total) by mouth daily. For blood pressure  . predniSONE (DELTASONE) 20 MG tablet Take 1 tablet (20 mg total) by mouth 2 (two) times daily with a meal.  . simvastatin (ZOCOR) 20 MG tablet Take one tablet by mouth once daily at 6pm for  cholesterol  . traZODone (DESYREL) 50 MG tablet Take 1 tablet (50 mg total) by mouth at bedtime. For sleep and depression   No facility-administered encounter medications on file as of 11/12/2017.     Review of Systems:  Review of Systems  Constitutional: Positive for chills and malaise/fatigue. Negative for diaphoresis, fever and weight loss.  HENT: Positive for hearing loss. Negative for congestion.   Eyes: Negative for blurred vision.  Respiratory: Positive for cough, sputum production, shortness of breath and wheezing. Negative for hemoptysis.   Cardiovascular: Negative for chest pain and leg swelling.  Gastrointestinal: Negative for abdominal pain, blood in stool, constipation and melena.  Genitourinary: Negative for dysuria.  Musculoskeletal: Negative for falls and joint pain.  Skin: Negative for itching and rash.       H/o melanoma  Neurological: Negative for dizziness and loss of consciousness.  Endo/Heme/Allergies: Bruises/bleeds easily.  Psychiatric/Behavioral: Negative for depression and memory loss. The patient has insomnia. The patient is not nervous/anxious.     Health Maintenance  Topic Date Due  . INFLUENZA VACCINE  02/07/2018  . COLONOSCOPY  08/31/2020  . TETANUS/TDAP  06/14/2023  . Hepatitis C Screening  Completed  . PNA vac Low Risk Adult  Completed    Physical Exam: Vitals:   11/12/17 0825  BP: 128/80  Pulse: 78  Temp: 97.7 F (36.5 C)  TempSrc: Oral  SpO2: 95%  Weight: 218 lb (98.9 kg)  Height: 5\' 10"  (1.778 m)   Body mass index is 31.28 kg/m. Physical Exam  Constitutional: He is oriented to person, place, and time. He appears well-developed.  HENT:  Nasal congestion, thick yellow-green mucus  Eyes:  Watery eyes  Cardiovascular: Normal rate, regular rhythm, normal heart sounds and intact distal pulses.  Pulmonary/Chest: Effort normal. No respiratory distress. He has wheezes.  Coarse rhonchi  Abdominal: Bowel sounds are normal.    Musculoskeletal: Normal range of motion.  Neurological: He is alert and oriented to person, place, and time. No cranial nerve deficit.  Skin: Skin is warm and dry.  Psychiatric: He has a normal mood and affect.    Labs reviewed: Basic Metabolic Panel: Recent Labs    11/16/16 0823 07/13/17 0827 11/10/17 2210  NA 139 137 136  K 4.8 4.6 4.4  CL 106 104 99*  CO2 22 27 26   GLUCOSE 122* 118* 121*  BUN 16 13 11   CREATININE 0.75 0.69* 0.87  CALCIUM 9.1 9.4 9.1   Liver Function Tests: Recent Labs    11/16/16 0823  AST 17  ALT 13  ALKPHOS 51  BILITOT 0.4  PROT 7.0  ALBUMIN 4.2   No results for input(s): LIPASE, AMYLASE in the last 8760 hours. No results for input(s): AMMONIA in the last 8760 hours. CBC: Recent Labs  11/16/16 0823 07/13/17 0827 11/10/17 2210  WBC 6.6 6.9 11.5*  NEUTROABS 3,498 3,754  --   HGB 15.4 15.0 14.0  HCT 46.2 44.8 42.0  MCV 91.1 90.1 92.9  PLT 236 232 315   Lipid Panel: Recent Labs    03/26/17 0853 07/13/17 0827 11/09/17 0821  CHOL 110 135 125  HDL 27* 29* 26*  LDLCALC 55 70 77  TRIG 228* 300* 132  CHOLHDL 4.1 4.7 4.8   Lab Results  Component Value Date   HGBA1C 5.9 (H) 11/09/2017    Procedures since last visit: Dg Chest 2 View  Result Date: 11/10/2017 CLINICAL DATA:  72 year old male with shortness of breath. EXAM: CHEST - 2 VIEW COMPARISON:  Chest radiograph dated 04/25/2005 FINDINGS: There is emphysematous changes of the lungs with diffuse interstitial coarsening and bibasilar atelectatic changes. Mild diffuse interstitial nodularity noted which may be chronic or represent an atypical infection. Clinical correlation is recommended. There is no focal consolidation, pleural effusion, or pneumothorax. The cardiac silhouette is within normal limits. The aorta is tortuous. No acute osseous pathology. IMPRESSION: Emphysema with mild chronic interstitial coarsening and nodularity. Pneumonia is not excluded. Clinical correlation is  recommended. Electronically Signed   By: Anner Crete M.D.   On: 11/10/2017 22:46    Assessment/Plan 1. Acute non-recurrent pansinusitis -complete course of levaquin as per ED  2. 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 Hypertriglyceridemia - cont to work on dietary changes for this and cont zocor - Hemoglobin A1c; Future - Lipid panel; Future - Basic metabolic panel; Future  5. Obesity (BMI 30-39.9) -cont to work on diet and recommend exercise routine when he recovers from his infection  6. Essential hypertension - bp at goal today which is unusual, cont to monitor - Basic metabolic panel; Future  7. Hyperglycemia - cont to work on diet and exercise - Hemoglobin A1c; Future  Labs/tests ordered:   Orders Placed This Encounter  Procedures  . CBC with Differential/Platelet    Standing Status:   Future    Standing Expiration Date:   07/15/2018  . Hemoglobin A1c    Standing Status:   Future    Standing Expiration Date:   07/15/2018  . Lipid panel    Standing Status:   Future    Standing Expiration Date:   07/15/2018    Order Specific Question:   Has the patient fasted?    Answer:   Yes  . Basic metabolic panel    Standing Status:   Future    Standing Expiration Date:   07/15/2018    Order Specific Question:   Has the patient fasted?    Answer:   Yes   Next appt:  03/28/2018 med mgt labs before  Cinsere Mizrahi L. Reigna Ruperto, D.O. [facility] [address], [place] [address] Cell Phone (Mon-Fri 8am-5pm):  [phone] On Call:  [phone] & follow prompts  after 5pm & weekends Office Phone:  [phone] Office Fax:  [phone]

## 2017-12-20 ENCOUNTER — Ambulatory Visit (INDEPENDENT_AMBULATORY_CARE_PROVIDER_SITE_OTHER): Payer: Medicare Other

## 2017-12-20 VITALS — BP 142/80 | HR 74 | Temp 97.9°F | Ht 70.0 in | Wt 208.0 lb

## 2017-12-20 DIAGNOSIS — Z0000 Encounter for general adult medical examination without abnormal findings: Secondary | ICD-10-CM | POA: Diagnosis not present

## 2017-12-20 DIAGNOSIS — Z598 Other problems related to housing and economic circumstances: Secondary | ICD-10-CM | POA: Diagnosis not present

## 2017-12-20 DIAGNOSIS — Z599 Problem related to housing and economic circumstances, unspecified: Secondary | ICD-10-CM

## 2017-12-20 NOTE — Patient Instructions (Signed)
David [name] , Thank you for taking time to come for your Medicare Wellness Visit. I appreciate your ongoing commitment to your health goals. Please review the following plan we discussed and let me know if I can assist you in the future.   Screening recommendations/referrals: Colonoscopy up to date, due 08/31/2020 Recommended yearly ophthalmology/optometry visit for glaucoma screening and checkup Recommended yearly dental visit for hygiene and checkup  Vaccinations: Influenza vaccine up to date, due 2019 fall season Pneumococcal vaccine up to date, completed Tdap vaccine up to date, due 06/14/2023 Shingles vaccine up to date, completed    Advanced directives: Advance directive discussed with you today. I have provided a copy for you to complete at home and have notarized. Once this is complete please bring a copy in to our office so we can scan it into your chart.  Conditions/risks identified: none  Next appointment: Dr. Mariea Clonts 03/28/2018 @ 7:30am            David Dense, RN 12/23/2018 @ 8:30am  Preventive Care 73 Years and Older, Male Preventive care refers to lifestyle choices and visits with your health care provider that can promote health and wellness. What does preventive care include?  A yearly physical exam. This is also called an annual well check.  Dental exams once or twice a year.  Routine eye exams. Ask your health care provider how often you should have your eyes checked.  Personal lifestyle choices, including:  Daily care of your teeth and gums.  Regular physical activity.  Eating a healthy diet.  Avoiding tobacco and drug use.  Limiting alcohol use.  Practicing safe sex.  Taking low doses of aspirin every day.  Taking vitamin and mineral supplements as recommended by your health care provider. What happens during an annual well check? The services and screenings done by your health care provider during your annual well check will depend on your age, overall  health, lifestyle risk factors, and family history of disease. Counseling  Your health care provider may ask you questions about your:  Alcohol use.  Tobacco use.  Drug use.  Emotional well-being.  Home and relationship well-being.  Sexual activity.  Eating habits.  History of falls.  Memory and ability to understand (cognition).  Work and work Statistician. Screening  You may have the following tests or measurements:  Height, weight, and BMI.  Blood pressure.  Lipid and cholesterol levels. These may be checked every 5 years, or more frequently if you are over 29 years old.  Skin check.  Lung cancer screening. You may have this screening every year starting at age 83 if you have a 30-pack-year history of smoking and currently smoke or have quit within the past 15 years.  Fecal occult blood test (FOBT) of the stool. You may have this test every year starting at age 42.  Flexible sigmoidoscopy or colonoscopy. You may have a sigmoidoscopy every 5 years or a colonoscopy every 10 years starting at age 49.  Prostate cancer screening. Recommendations will vary depending on your family history and other risks.  Hepatitis C blood test.  Hepatitis B blood test.  Sexually transmitted disease (STD) testing.  Diabetes screening. This is done by checking your blood sugar (glucose) after you have not eaten for a while (fasting). You may have this done every 1-3 years.  Abdominal aortic aneurysm (AAA) screening. You may need this if you are a current or former smoker.  Osteoporosis. You may be screened starting at age 55 if you  are at high risk. Talk with your health care provider about your test results, treatment options, and if necessary, the need for more tests. Vaccines  Your health care provider may recommend certain vaccines, such as:  Influenza vaccine. This is recommended every year.  Tetanus, diphtheria, and acellular pertussis (Tdap, Td) vaccine. You may need a Td  booster every 10 years.  Zoster vaccine. You may need this after age 27.  Pneumococcal 13-valent conjugate (PCV13) vaccine. One dose is recommended after age 83.  Pneumococcal polysaccharide (PPSV23) vaccine. One dose is recommended after age 43. Talk to your health care provider about which screenings and vaccines you need and how often you need them. This information is not intended to replace advice given to you by your health care provider. Make sure you discuss any questions you have with your health care provider. Document Released: 07/23/2015 Document Revised: 03/15/2016 Document Reviewed: 04/27/2015 Elsevier Interactive Patient Education  2017 [place] Prevention in the Home Falls can cause injuries. They can happen to people of all ages. There are many things you can do to make your home safe and to help prevent falls. What can I do on the outside of my home?  Regularly fix the edges of walkways and driveways and fix any cracks.  Remove anything that might make you trip as you walk through a door, such as a raised step or threshold.  Trim any bushes or trees on the path to your home.  Use bright outdoor lighting.  Clear any walking paths of anything that might make someone trip, such as rocks or tools.  Regularly check to see if handrails are loose or broken. Make sure that both sides of any steps have handrails.  Any raised decks and porches should have guardrails on the edges.  Have any leaves, snow, or ice cleared regularly.  Use sand or salt on walking paths during winter.  Clean up any spills in your garage right away. This includes oil or grease spills. What can I do in the bathroom?  Use night lights.  Install grab bars by the toilet and in the tub and shower. Do not use towel bars as grab bars.  Use non-skid mats or decals in the tub or shower.  If you need to sit down in the shower, use a plastic, non-slip stool.  Keep the floor dry. Clean up  any water that spills on the floor as soon as it happens.  Remove soap buildup in the tub or shower regularly.  Attach bath mats securely with double-sided non-slip rug tape.  Do not have throw rugs and other things on the floor that can make you trip. What can I do in the bedroom?  Use night lights.  Make sure that you have a light by your bed that is easy to reach.  Do not use any sheets or blankets that are too big for your bed. They should not hang down onto the floor.  Have a firm chair that has side arms. You can use this for support while you get dressed.  Do not have throw rugs and other things on the floor that can make you trip. What can I do in the kitchen?  Clean up any spills right away.  Avoid walking on wet floors.  Keep items that you use a lot in easy-to-reach places.  If you need to reach something above you, use a strong step stool that has a grab bar.  Keep electrical cords out  of the way.  Do not use floor polish or wax that makes floors slippery. If you must use wax, use non-skid floor wax.  Do not have throw rugs and other things on the floor that can make you trip. What can I do with my stairs?  Do not leave any items on the stairs.  Make sure that there are handrails on both sides of the stairs and use them. Fix handrails that are broken or loose. Make sure that handrails are as long as the stairways.  Check any carpeting to make sure that it is firmly attached to the stairs. Fix any carpet that is loose or worn.  Avoid having throw rugs at the top or bottom of the stairs. If you do have throw rugs, attach them to the floor with carpet tape.  Make sure that you have a light switch at the top of the stairs and the bottom of the stairs. If you do not have them, ask someone to add them for you. What else can I do to help prevent falls?  Wear shoes that:  Do not have high heels.  Have rubber bottoms.  Are comfortable and fit you well.  Are  closed at the toe. Do not wear sandals.  If you use a stepladder:  Make sure that it is fully opened. Do not climb a closed stepladder.  Make sure that both sides of the stepladder are locked into place.  Ask someone to hold it for you, if possible.  Clearly mark and make sure that you can see:  Any grab bars or handrails.  First and last steps.  Where the edge of each step is.  Use tools that help you move around (mobility aids) if they are needed. These include:  Canes.  Walkers.  Scooters.  Crutches.  Turn on the lights when you go into a dark area. Replace any light bulbs as soon as they burn out.  Set up your furniture so you have a clear path. Avoid moving your furniture around.  If any of your floors are uneven, fix them.  If there are any pets around you, be aware of where they are.  Review your medicines with your doctor. Some medicines can make you feel dizzy. This can increase your chance of falling. Ask your doctor what other things that you can do to help prevent falls. This information is not intended to replace advice given to you by your health care provider. Make sure you discuss any questions you have with your health care provider. Document Released: 04/22/2009 Document Revised: 12/02/2015 Document Reviewed: 07/31/2014 Elsevier Interactive Patient Education  2017 [employer].

## 2017-12-20 NOTE — Progress Notes (Signed)
Subjective:   David [name]. is a 72 y.o. male who presents for Medicare Annual/Subsequent preventive examination.  Last AWV-11/16/2016    Objective:    Vitals: BP (!) 142/80 (BP Location: Left Arm, Patient Position: Sitting)   Pulse 74   Temp 97.9 F (36.6 C) (Oral)   Ht 5\' 10"  (1.778 m)   Wt 208 lb (94.3 kg)   BMI 29.84 kg/m   Body mass index is 29.84 kg/m.  Advanced Directives 12/20/2017 11/12/2017 11/11/2017 12/08/2016 11/20/2016 11/16/2016 08/17/2016  Does Patient Have a Medical Advance Directive? No No No No No No No  Would patient like information on creating a medical advance directive? Yes (MAU/Ambulatory/Procedural Areas - Information given) No - Patient declined No - Patient declined - No - Patient declined Yes (MAU/Ambulatory/Procedural Areas - Information given) No - Patient declined    Tobacco Social History   Tobacco Use  Smoking Status Former Smoker  . Packs/day: 1.50  . Years: 55.00  . Pack years: 82.50  . Types: Cigarettes  . Last attempt to quit: 07/30/2015  . Years since quitting: 2.3  Smokeless Tobacco Never Used  Tobacco Comment   pt thinks increased effexor dose helped him to quit     Counseling given: Not Answered Comment: pt thinks increased effexor dose helped him to quit   Clinical Intake:  Pre-visit preparation completed: No  Pain : No/denies pain     Nutritional Risks: None Diabetes: No  How often do you need to have someone help you when you read instructions, pamphlets, or other written materials from your doctor or pharmacy?: 1 - Never What is the last grade level you completed in school?: HS  Interpreter Needed?: No  Information entered by :: [name], RN  Past Medical History:  Diagnosis Date  . Allergic rhinitis due to pollen   . Anxiety state, unspecified   . Asymptomatic varicose veins   . Atherosclerosis of native arteries of the extremities, unspecified   . Cataract 10/17/2016   L Eye  . Contact dermatitis  and other eczema due to other chemical products   . 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 Flatulence, eructation, and gas pain   . Headache(784.0)   . Hemorrhoids, external   . Herpes zoster with unspecified complication   . Hyperpotassemia   . Insomnia, unspecified   . Lumbago   . Lumbago   . Myalgia and myositis, unspecified   . Nocturia   . Obesity, unspecified   . Osteoarthrosis, unspecified whether generalized or localized, unspecified site   . Other abnormal blood chemistry   . Other headache syndromes(339.89)   . Pure hyperglyceridemia   . Rash and other nonspecific skin eruption   . Rotator cuff (capsule) sprain   . Routine general medical examination at a health care facility   . Tobacco use disorder   . Unspecified essential hypertension   . Unspecified hypertrophic and atrophic condition of skin   . Unspecified sleep apnea    Past Surgical History:  Procedure Laterality Date  . HEMORRHOID SURGERY    . VARICOSE VEIN SURGERY     Family History  Problem Relation Age of Onset  . Diabetes Mother   . Heart disease Mother   . Stroke Mother   . Stroke Father    Social History   Socioeconomic History  . Marital status: Married    Spouse name: Not on file  . Number of children: Not on file  . Years  of education: Not on file  . Highest education level: Not on file  Occupational History  . Not on file  Social Needs  . Financial resource strain: Somewhat hard  . Food insecurity:    Worry: Sometimes true    Inability: Sometimes true  . Transportation needs:    Medical: No    Non-medical: No  Tobacco Use  . Smoking status: Former Smoker    Packs/day: 1.50    Years: 55.00    Pack years: 82.50    Types: Cigarettes    Last attempt to quit: 07/30/2015    Years since quitting: 2.3  . Smokeless tobacco: Never Used  . Tobacco comment: pt thinks increased effexor dose helped him to quit  Substance and Sexual Activity  . Alcohol use:  No    Alcohol/week: 0.0 oz  . Drug use: No  . Sexual activity: Not on file  Lifestyle  . Physical activity:    Days per week: 7 days    Minutes per session: 20 min  . Stress: Rather much  Relationships  . Social connections:    Talks on phone: Three times a week    Gets together: Three times a week    Attends religious service: Never    Active member of club or organization: No    Attends meetings of clubs or organizations: Never    Relationship status: Married  Other Topics Concern  . Not on file  Social History Narrative   Married   Stopped smoking 07/30/15   Alcohol none   Exercise none   [employer] [phone]     Outpatient Encounter Medications as of 12/20/2017  Medication Sig  . albuterol (PROVENTIL HFA;VENTOLIN HFA) 108 (90 Base) MCG/ACT inhaler Inhale 1-2 puffs into the lungs every 6 (six) hours as needed for wheezing.  Marland Kitchen loratadine (CLARITIN) 10 MG tablet Take 1 tablet (10 mg total) by mouth daily as needed for allergies.  Marland Kitchen losartan (COZAAR) 50 MG tablet Take 1 tablet (50 mg total) by mouth daily. For blood pressure  . simvastatin (ZOCOR) 20 MG tablet Take one tablet by mouth once daily at 6pm for cholesterol  . traZODone (DESYREL) 50 MG tablet Take 1 tablet (50 mg total) by mouth at bedtime. For sleep and depression  . levofloxacin (LEVAQUIN) 500 MG tablet Take 1 tablet (500 mg total) by mouth daily.  . predniSONE (DELTASONE) 20 MG tablet Take 1 tablet (20 mg total) by mouth 2 (two) times daily with a meal.   No facility-administered encounter medications on file as of 12/20/2017.     Activities of Daily Living In your present state of health, do you have any difficulty performing the following activities: 12/20/2017  Hearing? N  Vision? N  Difficulty concentrating or making decisions? Y  Walking or climbing stairs? N  Dressing or bathing? N  Doing errands, shopping? N  Preparing Food and eating ? N  Using the Toilet? N  In the past six months, have you  accidently leaked urine? N  Do you have problems with loss of bowel control? N  Managing your Medications? N  Managing your Finances? N  Housekeeping or managing your Housekeeping? N  Some recent data might be hidden    Patient Care Team: Gayland Curry, DO as PCP - General (Geriatric Medicine) Katy Apo, MD as Consulting Physician (Ophthalmology)   Assessment:   This is a routine wellness examination for David [name].  Exercise Activities and Dietary recommendations Current Exercise Habits: Home exercise routine,  Type of exercise: walking, Time (Minutes): 20, Frequency (Times/Week): 7, Weekly Exercise (Minutes/Week): 140, Intensity: Mild, Exercise limited by: None identified  Goals    . Quit smoking / using tobacco       Fall Risk Fall Risk  12/20/2017 11/12/2017 11/06/2017 07/16/2017 03/29/2017  Falls in the past year? Yes No No No No  Number falls in past yr: 2 or more - - - -  Injury with Fall? No - - - -   Is the patient's home free of loose throw rugs in walkways, pet beds, electrical cords, etc?   yes      Grab bars in the bathroom? no      Handrails on the stairs?   yes      Adequate lighting?   yes  Timed Get Up and Go Performed: 13 seconds  Depression Screen PHQ 2/9 Scores 12/20/2017 11/12/2017 07/16/2017 03/29/2017  PHQ - 2 Score 1 0 0 2  PHQ- 9 Score - - - 10    Cognitive Function MMSE - Mini Mental State Exam 12/20/2017 11/16/2016 11/08/2015 04/10/2014  Orientation to time 5 5 5 5   Orientation to Place 5 5 5 5   Registration 3 3 3 3   Attention/ Calculation 5 5 5 5   Recall 3 0 1 1  Language- name 2 objects 2 2 2 2   Language- repeat 1 1 1 1   Language- follow 3 step command 3 3 3 3   Language- read & follow direction 1 1 1 1   Write a sentence 1 1 1 1   Copy design 1 0 1 1  Total score 30 26 28 28         Immunization History  Administered Date(s) Administered  . Influenza, High Dose Seasonal PF 04/03/2017  . Influenza,inj,Quad PF,6+ Mos 06/13/2013, 04/10/2014,  05/06/2015, 05/12/2016  . Influenza-Unspecified 04/09/2010, 05/09/2012  . Pneumococcal Conjugate-13 04/16/2014  . Pneumococcal Polysaccharide-23 01/26/2011, 11/25/2015  . Td 07/10/2000, 06/13/2013  . Zoster 08/05/2012  . Zoster Recombinat (Shingrix) 03/28/2017    Qualifies for Shingles Vaccine? Up to date, completed  Screening Tests Health Maintenance  Topic Date Due  . INFLUENZA VACCINE  02/07/2018  . COLONOSCOPY  08/31/2020  . TETANUS/TDAP  06/14/2023  . Hepatitis C Screening  Completed  . PNA vac Low Risk Adult  Completed   Cancer Screenings: Lung: Low Dose CT Chest recommended if Age 44-80 years, 30 pack-year currently smoking OR have quit w/in 15years. Patient does qualify. Colorectal: up to date  Additional Screenings:  Hepatitis C Screening:declined      Plan:    I have personally reviewed and addressed the Medicare Annual Wellness questionnaire and have noted the following in the patient's chart:  A. Medical and social history B. Use of alcohol, tobacco or illicit drugs  C. Current medications and supplements D. Functional ability and status E.  Nutritional status F.  Physical activity G. Advance directives H. List of other physicians I.  Hospitalizations, surgeries, and ER visits in previous 12 months J.  [facility] to include hearing, vision, cognitive, depression L. Referrals and appointments - C3 for financial help and dentist  In addition, I have reviewed and discussed with patient certain preventive protocols, quality metrics, and best practice recommendations. A written personalized care plan for preventive services as well as general preventive health recommendations were provided to patient.  See attached scanned questionnaire for additional information.   Signed,   [name], RN Nurse Health Advisor  Patient Concerns: None

## 2018-01-18 ENCOUNTER — Telehealth: Payer: Self-pay | Admitting: Internal Medicine

## 2018-01-18 NOTE — Telephone Encounter (Signed)
I called the patient about community resource referral, but the answering machine cut off part of my message.  I'll try again later. VDM (DD)

## 2018-02-04 DIAGNOSIS — Z961 Presence of intraocular lens: Secondary | ICD-10-CM | POA: Diagnosis not present

## 2018-02-04 DIAGNOSIS — H52202 Unspecified astigmatism, left eye: Secondary | ICD-10-CM | POA: Diagnosis not present

## 2018-02-04 DIAGNOSIS — H524 Presbyopia: Secondary | ICD-10-CM | POA: Diagnosis not present

## 2018-03-12 DIAGNOSIS — L84 Corns and callosities: Secondary | ICD-10-CM | POA: Diagnosis not present

## 2018-03-12 DIAGNOSIS — L720 Epidermal cyst: Secondary | ICD-10-CM | POA: Diagnosis not present

## 2018-03-12 DIAGNOSIS — Z85820 Personal history of malignant melanoma of skin: Secondary | ICD-10-CM | POA: Diagnosis not present

## 2018-03-12 DIAGNOSIS — L821 Other seborrheic keratosis: Secondary | ICD-10-CM | POA: Diagnosis not present

## 2018-03-12 DIAGNOSIS — D1801 Hemangioma of skin and subcutaneous tissue: Secondary | ICD-10-CM | POA: Diagnosis not present

## 2018-03-12 DIAGNOSIS — D225 Melanocytic nevi of trunk: Secondary | ICD-10-CM | POA: Diagnosis not present

## 2018-03-25 ENCOUNTER — Other Ambulatory Visit: Payer: Medicare Other

## 2018-03-25 DIAGNOSIS — J441 Chronic obstructive pulmonary disease with (acute) exacerbation: Secondary | ICD-10-CM | POA: Diagnosis not present

## 2018-03-25 DIAGNOSIS — C439 Malignant melanoma of skin, unspecified: Secondary | ICD-10-CM

## 2018-03-25 DIAGNOSIS — E781 Pure hyperglyceridemia: Secondary | ICD-10-CM

## 2018-03-25 DIAGNOSIS — I10 Essential (primary) hypertension: Secondary | ICD-10-CM | POA: Diagnosis not present

## 2018-03-25 DIAGNOSIS — R739 Hyperglycemia, unspecified: Secondary | ICD-10-CM | POA: Diagnosis not present

## 2018-03-26 LAB — CBC WITH DIFFERENTIAL/PLATELET
Basophils Absolute: 60 cells/uL (ref 0–200)
Basophils Relative: 1 %
Eosinophils Absolute: 108 cells/uL (ref 15–500)
Eosinophils Relative: 1.8 %
HCT: 44.3 % (ref 38.5–50.0)
Hemoglobin: 14.6 g/dL (ref 13.2–17.1)
Lymphs Abs: 1620 cells/uL (ref 850–3900)
MCH: 30.2 pg (ref 27.0–33.0)
MCHC: 33 g/dL (ref 32.0–36.0)
MCV: 91.7 fL (ref 80.0–100.0)
MPV: 12 fL (ref 7.5–12.5)
Monocytes Relative: 8.6 %
Neutro Abs: 3696 cells/uL (ref 1500–7800)
Neutrophils Relative %: 61.6 %
Platelets: 224 10*3/uL (ref 140–400)
RBC: 4.83 10*6/uL (ref 4.20–5.80)
RDW: 12.9 % (ref 11.0–15.0)
Total Lymphocyte: 27 %
WBC mixed population: 516 cells/uL (ref 200–950)
WBC: 6 10*3/uL (ref 3.8–10.8)

## 2018-03-26 LAB — HEMOGLOBIN A1C
Hgb A1c MFr Bld: 5.8 % of total Hgb — ABNORMAL HIGH (ref <5.7)
Mean Plasma Glucose: 120 (calc)
eAG (mmol/L): 6.6 (calc)

## 2018-03-26 LAB — LIPID PANEL
Cholesterol: 109 mg/dL (ref <200)
HDL: 41 mg/dL (ref >40)
LDL Cholesterol (Calc): 48 mg/dL (calc)
Non-HDL Cholesterol (Calc): 68 mg/dL (calc) (ref <130)
Total CHOL/HDL Ratio: 2.7 (calc) (ref <5.0)
Triglycerides: 112 mg/dL (ref <150)

## 2018-03-26 LAB — BASIC METABOLIC PANEL
BUN/Creatinine Ratio: 25 (calc) — ABNORMAL HIGH (ref 6–22)
BUN: 15 mg/dL (ref 7–25)
CO2: 27 mmol/L (ref 20–32)
Calcium: 9.5 mg/dL (ref 8.6–10.3)
Chloride: 109 mmol/L (ref 98–110)
Creat: 0.6 mg/dL — ABNORMAL LOW (ref 0.70–1.18)
Glucose, Bld: 112 mg/dL — ABNORMAL HIGH (ref 65–99)
Potassium: 4.2 mmol/L (ref 3.5–5.3)
Sodium: 142 mmol/L (ref 135–146)

## 2018-03-28 ENCOUNTER — Ambulatory Visit (INDEPENDENT_AMBULATORY_CARE_PROVIDER_SITE_OTHER): Payer: Medicare Other | Admitting: Internal Medicine

## 2018-03-28 ENCOUNTER — Encounter: Payer: Self-pay | Admitting: Internal Medicine

## 2018-03-28 VITALS — BP 150/80 | HR 62 | Temp 97.9°F | Ht 70.0 in | Wt 199.0 lb

## 2018-03-28 DIAGNOSIS — F5105 Insomnia due to other mental disorder: Secondary | ICD-10-CM

## 2018-03-28 DIAGNOSIS — I10 Essential (primary) hypertension: Secondary | ICD-10-CM

## 2018-03-28 DIAGNOSIS — Z23 Encounter for immunization: Secondary | ICD-10-CM | POA: Diagnosis not present

## 2018-03-28 DIAGNOSIS — E663 Overweight: Secondary | ICD-10-CM | POA: Diagnosis not present

## 2018-03-28 DIAGNOSIS — Z6825 Body mass index (BMI) 25.0-25.9, adult: Secondary | ICD-10-CM

## 2018-03-28 DIAGNOSIS — R739 Hyperglycemia, unspecified: Secondary | ICD-10-CM

## 2018-03-28 DIAGNOSIS — J302 Other seasonal allergic rhinitis: Secondary | ICD-10-CM

## 2018-03-28 DIAGNOSIS — F99 Mental disorder, not otherwise specified: Secondary | ICD-10-CM | POA: Diagnosis not present

## 2018-03-28 DIAGNOSIS — F331 Major depressive disorder, recurrent, moderate: Secondary | ICD-10-CM | POA: Diagnosis not present

## 2018-03-28 DIAGNOSIS — E781 Pure hyperglyceridemia: Secondary | ICD-10-CM

## 2018-03-28 MED ORDER — LOSARTAN POTASSIUM 50 MG PO TABS: 50.0000 mg | ORAL_TABLET | Freq: Every day | ORAL | 3 refills | Status: DC

## 2018-03-28 MED ORDER — TRAZODONE HCL 100 MG PO TABS: 100.0000 mg | ORAL_TABLET | Freq: Every day | ORAL | 3 refills | Status: DC

## 2018-03-28 NOTE — Progress Notes (Signed)
Location:  [facility] clinic Provider:  Greene Diodato L. Mariea Clonts, D.O., C.M.D.  Code Status: full code Goals of Care:  Advanced Directives 12/20/2017  Does Patient Have a Medical Advance Directive? No  Would patient like information on creating a medical advance directive? Yes (MAU/Ambulatory/Procedural Areas - Information given)   Chief Complaint  Patient presents with  . Medical Management of Chronic Issues    92mth follow-up    HPI: Patient is a 72 y.o. male seen today for medical management of chronic diseases.    He is walking for exercise 1 mile 6 days a week.  hba1c and lipids have improved so LDL at goal and TGs and hba1c down by 0.1.    Cannot get a good night's sleep.  10pm goes to bed and wakes up at 4:30-5am.  Takes the trazodone at 9:30pm when he does his bible study.    No longer wants to use mail orders due to convenience issues.  Back flares up a little bit after mowing the yard.  Lasts a day and then he's better.  Will take an advil or tylenol at bedtime the day of and better then next morning.    Remains off cigarettes since 1/17.  Will smell cigarettes and sometimes disgusted by it and sometimes   He's lost 35 lbs in 2 years.  He's lost most of his weight since he had a scare and had to spend the night in the hospital.  He had a COPD exacerbation and sinusitis in May (that's what he was referring to).    BP up at arrival.  Was drinking coffee b/c he was up early.    Allergies:  Sneezing a lot off an on all day with loratadine.    Past Medical History:  Diagnosis Date  . Allergic rhinitis due to pollen   . Anxiety state, unspecified   . Asymptomatic varicose veins   . Atherosclerosis of native arteries of the extremities, unspecified   . Cataract 10/17/2016   L Eye  . Contact dermatitis and other eczema due to other chemical products   . 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 Flatulence, eructation, and gas pain   . Headache(784.0)   .  Hemorrhoids, external   . Herpes zoster with unspecified complication   . Hyperpotassemia   . Insomnia, unspecified   . Lumbago   . Lumbago   . Myalgia and myositis, unspecified   . Nocturia   . Obesity, unspecified   . Osteoarthrosis, unspecified whether generalized or localized, unspecified site   . Other abnormal blood chemistry   . Other headache syndromes(339.89)   . Pure hyperglyceridemia   . Rash and other nonspecific skin eruption   . Rotator cuff (capsule) sprain   . Routine general medical examination at a health care facility   . Tobacco use disorder   . Unspecified essential hypertension   . Unspecified hypertrophic and atrophic condition of skin   . Unspecified sleep apnea     Past Surgical History:  Procedure Laterality Date  . HEMORRHOID SURGERY    . VARICOSE VEIN SURGERY      Allergies  Allergen Reactions  . Celebrex [Celecoxib]   . Erythromycin   . Oxycontin [Oxycodone Hcl]   . Penicillins   . Tetracyclines & Related     Outpatient Encounter Medications as of 03/28/2018  Medication Sig  . loratadine (CLARITIN) 10 MG tablet Take 1 tablet (10 mg total) by mouth daily as needed for allergies.  Marland Kitchen  losartan (COZAAR) 50 MG tablet Take 1 tablet (50 mg total) by mouth daily. For blood pressure  . simvastatin (ZOCOR) 20 MG tablet Take one tablet by mouth once daily at 6pm for cholesterol  . traZODone (DESYREL) 50 MG tablet Take 1 tablet (50 mg total) by mouth at bedtime. For sleep and depression  . [DISCONTINUED] albuterol (PROVENTIL HFA;VENTOLIN HFA) 108 (90 Base) MCG/ACT inhaler Inhale 1-2 puffs into the lungs every 6 (six) hours as needed for wheezing.  . [DISCONTINUED] levofloxacin (LEVAQUIN) 500 MG tablet Take 1 tablet (500 mg total) by mouth daily.  . [DISCONTINUED] predniSONE (DELTASONE) 20 MG tablet Take 1 tablet (20 mg total) by mouth 2 (two) times daily with a meal.   No facility-administered encounter medications on file as of 03/28/2018.     Review  of Systems:  Review of Systems  Constitutional: Positive for weight loss. Negative for chills and fever.  HENT: Positive for congestion and hearing loss. Negative for sinus pain.   Eyes: Negative for blurred vision.       Glasses  Respiratory: Negative for cough and shortness of breath.   Cardiovascular: Negative for chest pain and palpitations.  Gastrointestinal: Negative for abdominal pain and constipation.  Genitourinary: Negative for dysuria.  Musculoskeletal: Positive for back pain and myalgias. Negative for falls.  Skin: Negative for itching and rash.  Neurological: Negative for dizziness and loss of consciousness.  Endo/Heme/Allergies: Does not bruise/bleed easily.  Psychiatric/Behavioral: Positive for memory loss. Negative for depression. The patient has insomnia. The patient is not nervous/anxious.        Reports early wakening    Health Maintenance  Topic Date Due  . INFLUENZA VACCINE  02/07/2018  . COLONOSCOPY  08/31/2020  . TETANUS/TDAP  06/14/2023  . Hepatitis C Screening  Completed  . PNA vac Low Risk Adult  Completed    Physical Exam: Vitals:   03/28/18 0735  BP: (!) 150/80  Pulse: 62  Temp: 97.9 F (36.6 C)  TempSrc: Oral  SpO2: 97%  Weight: 199 lb (90.3 kg)  Height: 5\' 10"  (1.778 m)   Body mass index is 28.55 kg/m. Physical Exam  Constitutional: He is oriented to person, place, and time. He appears well-developed and well-nourished. No distress.  HENT:  Poor dentition  Cardiovascular: Normal rate, regular rhythm, normal heart sounds and intact distal pulses.  Pulmonary/Chest: Effort normal and breath sounds normal. He has no wheezes.  Abdominal: Bowel sounds are normal.  Musculoskeletal: Normal range of motion. He exhibits no tenderness.  Neurological: He is alert and oriented to person, place, and time.  Skin: Skin is warm and dry. Capillary refill takes less than 2 seconds.  Psychiatric: He has a normal mood and affect. His behavior is normal.  Judgment and thought content normal.    Labs reviewed: Basic Metabolic Panel: Recent Labs    07/13/17 0827 11/10/17 2210 03/25/18 0810  NA 137 136 142  K 4.6 4.4 4.2  CL 104 99* 109  CO2 27 26 27   GLUCOSE 118* 121* 112*  BUN 13 11 15   CREATININE 0.69* 0.87 0.60*  CALCIUM 9.4 9.1 9.5   Liver Function Tests: No results for input(s): AST, ALT, ALKPHOS, BILITOT, PROT, ALBUMIN in the last 8760 hours. No results for input(s): LIPASE, AMYLASE in the last 8760 hours. No results for input(s): AMMONIA in the last 8760 hours. CBC: Recent Labs    07/13/17 0827 11/10/17 2210 03/25/18 0810  WBC 6.9 11.5* 6.0  NEUTROABS 3,754  --  3,696  HGB  15.0 14.0 14.6  HCT 44.8 42.0 44.3  MCV 90.1 92.9 91.7  PLT 232 315 224   Lipid Panel: Recent Labs    07/13/17 0827 11/09/17 0821 03/25/18 0810  CHOL 135 125 109  HDL 29* 26* 41  LDLCALC 70 77 48  TRIG 300* 132 112  CHOLHDL 4.7 4.8 2.7   Lab Results  Component Value Date   HGBA1C 5.8 (H) 03/25/2018   Assessment/Plan 1. Insomnia due to other mental disorder - sleeping for 6.5 hrs now before awakening, but would like to sleep longer--not sure that an increase in trazodone will help but will try it - traZODone (DESYREL) 100 MG tablet; Take 1 tablet (100 mg total) by mouth at bedtime. For sleep and depression  Dispense: 90 tablet; Refill: 3  2. Moderate episode of recurrent major depressive disorder (HCC) - spirits much better lately with this - traZODone (DESYREL) 100 MG tablet; Take 1 tablet (100 mg total) by mouth at bedtime. For sleep and depression  Dispense: 90 tablet; Refill: 3  3. Uncontrolled hypertension - losartan (COZAAR) 50 MG tablet; Take 1 tablet (50 mg total) by mouth daily. For blood pressure  Dispense: 90 tablet; Refill: 3 -repeat bp still high after drinking more coffee--asked him not to have coffee next time so I can see if that caused his bp to be high -if still high, will increase losartan dose to 100mg    4.  Overweight  -improved with walking and dietary changes over the past several months--he is making an effort   5. Hypertriglyceridemia -improved to normal with exercise  6. Hyperglycemia -improved slightly from last time, remains in prediabetic range  7. Seasonal allergies -cont loratadine  8. Need for influenza vaccination - Flu vaccine HIGH DOSE PF (Fluzone High dose)  9.  BMI 25-29--counseled on continued exercise regimen which has been effective for him.    Labs/tests ordered:  Bmp, hba1c, flp Next appt:  07/29/2018 med mgt, fasting labs before  Kumari Sculley L. Basir Niven, D.O. [facility] [address], [place] [address] Cell Phone (Mon-Fri 8am-5pm):  [phone] On Call:  [phone] & follow prompts after 5pm & weekends Office Phone:  [phone] Office Fax:  [phone]

## 2018-03-28 NOTE — Addendum Note (Signed)
Addended by: Gayland Curry on: 03/28/2018 08:24 AM   Modules accepted: Orders

## 2018-05-07 DIAGNOSIS — J309 Allergic rhinitis, unspecified: Secondary | ICD-10-CM | POA: Diagnosis not present

## 2018-05-07 DIAGNOSIS — H1045 Other chronic allergic conjunctivitis: Secondary | ICD-10-CM | POA: Diagnosis not present

## 2018-05-23 ENCOUNTER — Ambulatory Visit (INDEPENDENT_AMBULATORY_CARE_PROVIDER_SITE_OTHER): Payer: Medicare Other | Admitting: Internal Medicine

## 2018-05-23 ENCOUNTER — Encounter: Payer: Self-pay | Admitting: Internal Medicine

## 2018-05-23 VITALS — BP 146/80 | HR 75 | Temp 97.9°F | Ht 70.0 in | Wt 205.4 lb

## 2018-05-23 DIAGNOSIS — L02612 Cutaneous abscess of left foot: Secondary | ICD-10-CM | POA: Diagnosis not present

## 2018-05-23 DIAGNOSIS — M795 Residual foreign body in soft tissue: Secondary | ICD-10-CM | POA: Diagnosis not present

## 2018-05-23 MED ORDER — CEPHALEXIN 500 MG PO CAPS: 500.0000 mg | ORAL_CAPSULE | Freq: Three times a day (TID) | ORAL | 0 refills | Status: DC

## 2018-05-23 NOTE — Patient Instructions (Signed)
You had a thorn/foreign body in the crease of your left great toe. Please keep your feet clean and dry as best you can Do not walk around barefoot Be sure to change the bandaid on the wound.  If you notice redness, warmth, increased drainage or pain from the area, please call us back. Take the keflex 500mg  three times a day for 7 days due to the abscess/infection.

## 2018-05-23 NOTE — Progress Notes (Signed)
Location:  [facility] clinic Provider: Zaide Mcclenahan L. Mariea Clonts, D.O., C.M.D.  Code Status: full code Goals of Care:  Advanced Directives 05/23/2018  Does Patient Have a Medical Advance Directive? No  Would patient like information on creating a medical advance directive? No - Patient declined   Chief Complaint  Patient presents with  . SORE ON LEFT FOOT    Symptoms started 11/13 painful to walk on     HPI: Patient is a 72 y.o. male with h/o prediabetesseen today for an acute visit for a sore place on his left foot. Hurt last night when he stepped on it.  Shot up his leg to his back.  Didn't stick anything in it or step on anything that he knows of.  He does not walk around barefoot.  As he was showing it to me, he squeezed it and infected secretions came out (thick, yellow-white) and a small thorn.    Past Medical History:  Diagnosis Date  . Allergic rhinitis due to pollen   . Anxiety state, unspecified   . Asymptomatic varicose veins   . Atherosclerosis of native arteries of the extremities, unspecified   . Cataract 10/17/2016   L Eye  . Contact dermatitis and other eczema due to other chemical products   . 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 Flatulence, eructation, and gas pain   . Headache(784.0)   . Hemorrhoids, external   . Herpes zoster with unspecified complication   . Hyperpotassemia   . Insomnia, unspecified   . Lumbago   . Lumbago   . Myalgia and myositis, unspecified   . Nocturia   . Obesity, unspecified   . Osteoarthrosis, unspecified whether generalized or localized, unspecified site   . Other abnormal blood chemistry   . Other headache syndromes(339.89)   . Pure hyperglyceridemia   . Rash and other nonspecific skin eruption   . Rotator cuff (capsule) sprain   . Routine general medical examination at a health care facility   . Tobacco use disorder   . Unspecified essential hypertension   . Unspecified hypertrophic and atrophic  condition of skin   . Unspecified sleep apnea     Past Surgical History:  Procedure Laterality Date  . HEMORRHOID SURGERY    . VARICOSE VEIN SURGERY      Allergies  Allergen Reactions  . Celebrex [Celecoxib]   . Erythromycin   . Oxycontin [Oxycodone Hcl]   . Penicillins   . Tetracyclines & Related     Outpatient Encounter Medications as of 05/23/2018  Medication Sig  . loratadine (CLARITIN) 10 MG tablet Take 1 tablet (10 mg total) by mouth daily as needed for allergies.  Marland Kitchen losartan (COZAAR) 50 MG tablet Take 1 tablet (50 mg total) by mouth daily. For blood pressure  . simvastatin (ZOCOR) 20 MG tablet Take one tablet by mouth once daily at 6pm for cholesterol  . traZODone (DESYREL) 100 MG tablet Take 1 tablet (100 mg total) by mouth at bedtime. For sleep and depression   No facility-administered encounter medications on file as of 05/23/2018.     Review of Systems:  Review of Systems  Constitutional: Negative for chills and fever.  Musculoskeletal:       Foot pain  Psychiatric/Behavioral: The patient has insomnia.     Health Maintenance  Topic Date Due  . COLONOSCOPY  08/31/2020  . TETANUS/TDAP  06/14/2023  . INFLUENZA VACCINE  Completed  . Hepatitis C Screening  Completed  . PNA vac  Low Risk Adult  Completed    Physical Exam: Vitals:   05/23/18 0836  BP: (!) 146/80  Pulse: 75  Temp: 97.9 F (36.6 C)  TempSrc: Oral  SpO2: 96%  Weight: 205 lb 6.4 oz (93.2 kg)  Height: 5\' 10"  (1.778 m)   Body mass index is 29.47 kg/m. Physical Exam  Constitutional: He is oriented to person, place, and time. He appears well-developed and well-nourished. No distress.  Musculoskeletal:  Limping on the left foot  Neurological: He is alert and oriented to person, place, and time.  Skin:  Tender at base of his left great toe; calloused area present now with a hole in it where some infected drainage came out, foul odor and small thorn that I saw; area was then cleansed with  betadine and sprayed with topical bupivicaine, area was incised and debrided though very little more came out, remained tender    Labs reviewed: Basic Metabolic Panel: Recent Labs    07/13/17 0827 11/10/17 2210 03/25/18 0810  NA 137 136 142  K 4.6 4.4 4.2  CL 104 99* 109  CO2 27 26 27   GLUCOSE 118* 121* 112*  BUN 13 11 15   CREATININE 0.69* 0.87 0.60*  CALCIUM 9.4 9.1 9.5   Liver Function Tests: No results for input(s): AST, ALT, ALKPHOS, BILITOT, PROT, ALBUMIN in the last 8760 hours. No results for input(s): LIPASE, AMYLASE in the last 8760 hours. No results for input(s): AMMONIA in the last 8760 hours. CBC: Recent Labs    07/13/17 0827 11/10/17 2210 03/25/18 0810  WBC 6.9 11.5* 6.0  NEUTROABS 3,754  --  3,696  HGB 15.0 14.0 14.6  HCT 44.8 42.0 44.3  MCV 90.1 92.9 91.7  PLT 232 315 224   Lipid Panel: Recent Labs    07/13/17 0827 11/09/17 0821 03/25/18 0810  CHOL 135 125 109  HDL 29* 26* 41  LDLCALC 70 77 48  TRIG 300* 132 112  CHOLHDL 4.7 4.8 2.7   Lab Results  Component Value Date   HGBA1C 5.8 (H) 03/25/2018    Assessment/Plan 1. Foreign body (FB) in soft tissue - came out with forceful squeezing of area and subsequent I/D of remaining infected fluid - cephALEXin (KEFLEX) 500 MG capsule; Take 1 capsule (500 mg total) by mouth 3 (three) times daily. For toe abscess  Dispense: 21 capsule; Refill: 0  2. Foot abscess, left - small at site of foreign body at base of toe - "You had a thorn/foreign body in the crease of your left great toe. Please keep your feet clean and dry as best you can Do not walk around barefoot Be sure to change the bandaid on the wound.  If you notice redness, warmth, increased drainage or pain from the area, fever, please call us back. Take the keflex 500mg  three times a day for 7 days due to the abscess/infection." - cephALEXin (KEFLEX) 500 MG capsule; Take 1 capsule (500 mg total) by mouth 3 (three) times daily. For toe abscess   Dispense: 21 capsule; Refill: 0  Labs/tests ordered:  No new Next appt:  07/24/2018--keep as scheduled  Joye Wesenberg L. Lucerito Rosinski, D.O. [facility] [address], [place] [address] Cell Phone (Mon-Fri 8am-5pm):  [phone] On Call:  [phone] & follow prompts after 5pm & weekends Office Phone:  [phone] Office Fax:  [phone]

## 2018-05-27 ENCOUNTER — Ambulatory Visit: Payer: Medicare Other | Admitting: Internal Medicine

## 2018-07-19 ENCOUNTER — Other Ambulatory Visit: Payer: Self-pay | Admitting: Internal Medicine

## 2018-07-19 DIAGNOSIS — E781 Pure hyperglyceridemia: Secondary | ICD-10-CM

## 2018-07-19 DIAGNOSIS — R739 Hyperglycemia, unspecified: Secondary | ICD-10-CM

## 2018-07-24 ENCOUNTER — Other Ambulatory Visit: Payer: TRICARE For Life (TFL)

## 2018-07-24 ENCOUNTER — Other Ambulatory Visit: Payer: Self-pay | Admitting: Internal Medicine

## 2018-07-24 DIAGNOSIS — Z6825 Body mass index (BMI) 25.0-25.9, adult: Secondary | ICD-10-CM | POA: Diagnosis not present

## 2018-07-24 DIAGNOSIS — E781 Pure hyperglyceridemia: Secondary | ICD-10-CM

## 2018-07-24 DIAGNOSIS — E663 Overweight: Secondary | ICD-10-CM | POA: Diagnosis not present

## 2018-07-24 DIAGNOSIS — R739 Hyperglycemia, unspecified: Secondary | ICD-10-CM

## 2018-07-24 DIAGNOSIS — Z9109 Other allergy status, other than to drugs and biological substances: Secondary | ICD-10-CM

## 2018-07-24 NOTE — Telephone Encounter (Signed)
Patient walked in and requested refill.

## 2018-07-25 LAB — LIPID PANEL
Cholesterol: 98 mg/dL (ref <200)
HDL: 40 mg/dL — ABNORMAL LOW (ref >40)
LDL Cholesterol (Calc): 44 mg/dL (calc)
Non-HDL Cholesterol (Calc): 58 mg/dL (calc) (ref <130)
Total CHOL/HDL Ratio: 2.5 (calc) (ref <5.0)
Triglycerides: 67 mg/dL (ref <150)

## 2018-07-25 LAB — BASIC METABOLIC PANEL
BUN/Creatinine Ratio: 20 (calc) (ref 6–22)
BUN: 14 mg/dL (ref 7–25)
CO2: 27 mmol/L (ref 20–32)
Calcium: 9.2 mg/dL (ref 8.6–10.3)
Chloride: 106 mmol/L (ref 98–110)
Creat: 0.69 mg/dL — ABNORMAL LOW (ref 0.70–1.18)
Glucose, Bld: 119 mg/dL — ABNORMAL HIGH (ref 65–99)
Potassium: 4.3 mmol/L (ref 3.5–5.3)
Sodium: 139 mmol/L (ref 135–146)

## 2018-07-25 LAB — HEMOGLOBIN A1C
Hgb A1c MFr Bld: 5.6 % of total Hgb (ref <5.7)
Mean Plasma Glucose: 114 (calc)
eAG (mmol/L): 6.3 (calc)

## 2018-07-29 ENCOUNTER — Ambulatory Visit (INDEPENDENT_AMBULATORY_CARE_PROVIDER_SITE_OTHER): Payer: Medicare Other | Admitting: Internal Medicine

## 2018-07-29 ENCOUNTER — Encounter: Payer: Self-pay | Admitting: Internal Medicine

## 2018-07-29 VITALS — BP 148/80 | HR 62 | Temp 98.1°F | Ht 70.0 in | Wt 208.0 lb

## 2018-07-29 DIAGNOSIS — I10 Essential (primary) hypertension: Secondary | ICD-10-CM

## 2018-07-29 DIAGNOSIS — F99 Mental disorder, not otherwise specified: Secondary | ICD-10-CM

## 2018-07-29 DIAGNOSIS — M25562 Pain in left knee: Secondary | ICD-10-CM

## 2018-07-29 DIAGNOSIS — F5105 Insomnia due to other mental disorder: Secondary | ICD-10-CM | POA: Diagnosis not present

## 2018-07-29 DIAGNOSIS — E663 Overweight: Secondary | ICD-10-CM

## 2018-07-29 DIAGNOSIS — E781 Pure hyperglyceridemia: Secondary | ICD-10-CM | POA: Diagnosis not present

## 2018-07-29 DIAGNOSIS — R739 Hyperglycemia, unspecified: Secondary | ICD-10-CM

## 2018-07-29 NOTE — Progress Notes (Signed)
Location:  [facility] clinic Provider:  Earnest Mcgillis L. Mariea Clonts, D.O., C.M.D.  Goals of Care:  Advanced Directives 05/23/2018  Does Patient Have a Medical Advance Directive? No  Would patient like information on creating a medical advance directive? No - Patient declined     Chief Complaint  Patient presents with  . Medical Management of Chronic Issues    60mth follow-up    HPI: Patient is a 73 y.o. male seen today for medical management of chronic diseases.    BP is high this am.  He had a cup of coffee before he came in.    Weight:  He's now joined [employer].  Wants to swim and use sauna there.  He has not gone swimming since he retired from [employer] in 1987.  He has lost 6 more lbs since November.    Cholesterol:  LDL is at goal.  Triglycerides normal.    Hyperglycemia:  Hba1c down to 5.6 which is normal range.    Still sleeping well with trazodone and now with some exercise.  He's even fallen asleep w/o taking them.  Says if things pop up that make him depressed like his wife's memory is declining.  [place] is forgetting her pills, vitamins, repeating herself and he's concerned.  Her mmse has declined.  He's afraid she'll get lost driving around visiting her [place] friends.    Left knee is sore medially now at night.  Used to be able to pop it and it would relieve it.  He'll sit with his legs up reading, then when he stands up it hurts him.     Past Medical History:  Diagnosis Date  . Allergic rhinitis due to pollen   . Anxiety state, unspecified   . Asymptomatic varicose veins   . Atherosclerosis of native arteries of the extremities, unspecified   . Cataract 10/17/2016   L Eye  . Contact dermatitis and other eczema due to other chemical products   . 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 Flatulence, eructation, and gas pain   . Headache(784.0)   . Hemorrhoids, external   . Herpes zoster with unspecified complication   . Hyperpotassemia   .  Insomnia, unspecified   . Lumbago   . Lumbago   . Myalgia and myositis, unspecified   . Nocturia   . Obesity, unspecified   . Osteoarthrosis, unspecified whether generalized or localized, unspecified site   . Other abnormal blood chemistry   . Other headache syndromes(339.89)   . Pure hyperglyceridemia   . Rash and other nonspecific skin eruption   . Rotator cuff (capsule) sprain   . Routine general medical examination at a health care facility   . Tobacco use disorder   . Unspecified essential hypertension   . Unspecified hypertrophic and atrophic condition of skin   . Unspecified sleep apnea     Past Surgical History:  Procedure Laterality Date  . HEMORRHOID SURGERY    . VARICOSE VEIN SURGERY      Allergies  Allergen Reactions  . Celebrex [Celecoxib]   . Erythromycin   . Oxycontin [Oxycodone Hcl]   . Penicillins   . Tetracyclines & Related     Outpatient Encounter Medications as of 07/29/2018  Medication Sig  . loratadine (CLARITIN) 10 MG tablet TAKE 1 TABLET BY MOUTH DAILY AS NEEDED FOR ALLERGIES  . losartan (COZAAR) 50 MG tablet Take 1 tablet (50 mg total) by mouth daily. For blood pressure  . simvastatin (ZOCOR) 20  MG tablet TAKE 1 TABLET BY MOUTH EVERY DAY AT 6 PM FOR CHOLESTEROL  . traZODone (DESYREL) 100 MG tablet Take 1 tablet (100 mg total) by mouth at bedtime. For sleep and depression  . [DISCONTINUED] cephALEXin (KEFLEX) 500 MG capsule Take 1 capsule (500 mg total) by mouth 3 (three) times daily. For toe abscess   No facility-administered encounter medications on file as of 07/29/2018.     Review of Systems:  Review of Systems  Constitutional: Negative for chills and fever.  HENT: Positive for hearing loss. Negative for congestion.        Sneezing more  Eyes: Positive for redness. Negative for blurred vision.  Respiratory: Negative for cough, sputum production and shortness of breath.   Cardiovascular: Negative for chest pain, palpitations and leg  swelling.  Gastrointestinal: Negative for abdominal pain, blood in stool, constipation, diarrhea, heartburn and melena.  Genitourinary: Negative for dysuria.  Musculoskeletal: Positive for joint pain. Negative for falls.       Left medial knee when straightens, bends over right knee or stands on it to walk--if keeps walking, gets better  Skin:       Dry scaling skin especially after shaving; barber says he's shaving too close; happens on scalp and in nasolabial folds   Neurological: Negative for dizziness and loss of consciousness.  Endo/Heme/Allergies: Does not bruise/bleed easily.  Psychiatric/Behavioral: Negative for depression and memory loss. The patient is not nervous/anxious and does not have insomnia.     Health Maintenance  Topic Date Due  . COLONOSCOPY  08/31/2020  . TETANUS/TDAP  06/14/2023  . INFLUENZA VACCINE  Completed  . Hepatitis C Screening  Completed  . PNA vac Low Risk Adult  Completed    Physical Exam: Vitals:   07/29/18 0835  BP: (!) 160/80  Pulse: 62  Temp: 98.1 F (36.7 C)  TempSrc: Oral  SpO2: 96%  Weight: 208 lb (94.3 kg)  Height: 5\' 10"  (1.778 m)   Body mass index is 29.84 kg/m. Physical Exam Vitals signs reviewed.  Constitutional:      Appearance: Normal appearance.  HENT:     Head: Normocephalic and atraumatic.     Mouth/Throat:     Comments: Rotting teeth Eyes:     Comments: Chronic erythema of eyes due to dry eyes  Cardiovascular:     Rate and Rhythm: Normal rate and regular rhythm.     Pulses: Normal pulses.     Heart sounds: Normal heart sounds.  Pulmonary:     Effort: Pulmonary effort is normal.     Breath sounds: Normal breath sounds. No rhonchi.  Abdominal:     General: Bowel sounds are normal.  Musculoskeletal: Normal range of motion.        General: Tenderness present. No swelling.     Comments: Left medial knee pain  Skin:    General: Skin is warm and dry.     Capillary Refill: Capillary refill takes less than 2 seconds.      Comments: Dry scaly skin of nasolabial folds and where he shaved  Neurological:     General: No focal deficit present.     Mental Status: He is alert and oriented to person, place, and time.  Psychiatric:        Mood and Affect: Mood normal.    Labs reviewed: Basic Metabolic Panel: Recent Labs    11/10/17 2210 03/25/18 0810 07/24/18 0821  NA 136 142 139  K 4.4 4.2 4.3  CL 99* 109 106  CO2  26 27 27   GLUCOSE 121* 112* 119*  BUN 11 15 14   CREATININE 0.87 0.60* 0.69*  CALCIUM 9.1 9.5 9.2   Liver Function Tests: No results for input(s): AST, ALT, ALKPHOS, BILITOT, PROT, ALBUMIN in the last 8760 hours. No results for input(s): LIPASE, AMYLASE in the last 8760 hours. No results for input(s): AMMONIA in the last 8760 hours. CBC: Recent Labs    11/10/17 2210 03/25/18 0810  WBC 11.5* 6.0  NEUTROABS  --  3,696  HGB 14.0 14.6  HCT 42.0 44.3  MCV 92.9 91.7  PLT 315 224   Lipid Panel: Recent Labs    11/09/17 0821 03/25/18 0810 07/24/18 0821  CHOL 125 109 98  HDL 26* 41 40*  LDLCALC 77 48 44  TRIG 132 112 67  CHOLHDL 4.8 2.7 2.5   Lab Results  Component Value Date   HGBA1C 5.6 07/24/2018    Assessment/Plan 1. Hypertriglyceridemia - resolved with improved diet, exercise and zocor - Lipid panel; Future  2. Hyperglycemia - in normal range now with dietary changes - Hemoglobin A1c; Future  3. Uncontrolled hypertension -bp recheck improved--had coffee that drove it up this am, but had been improved, monitor  4. Insomnia due to other mental disorder -cont trazodone therapy which has helped  5. Overweight (BMI 25.0-29.9) - cont to work on exercise--now going to swim, and improved diet - Basic metabolic panel; Future - Hemoglobin A1c; Future - Lipid panel; Future  6.  Left knee pain:   -Advised to use tylenol if needed for pain and continue biofreeze, walking, swimming -If still bothersome in a couple of weeks, will obtain xrays to evaluate  Labs/tests  ordered:   Orders Placed This Encounter  Procedures  . Basic metabolic panel    Standing Status:   Future    Standing Expiration Date:   07/30/2019    Order Specific Question:   Has the patient fasted?    Answer:   Yes  . Hemoglobin A1c    Standing Status:   Future    Standing Expiration Date:   07/30/2019  . Lipid panel    Standing Status:   Future    Standing Expiration Date:   07/30/2019    Order Specific Question:   Has the patient fasted?    Answer:   Yes    Next appt:  12/23/2018  Fahmida Jurich L. Georgia Delsignore, D.O. [facility] [address], [place] [address] Cell Phone (Mon-Fri 8am-5pm):  [phone] On Call:  [phone] & follow prompts after 5pm & weekends Office Phone:  [phone] Office Fax:  [phone]

## 2018-09-12 DIAGNOSIS — L570 Actinic keratosis: Secondary | ICD-10-CM | POA: Diagnosis not present

## 2018-09-12 DIAGNOSIS — Z85820 Personal history of malignant melanoma of skin: Secondary | ICD-10-CM | POA: Diagnosis not present

## 2018-09-12 DIAGNOSIS — L821 Other seborrheic keratosis: Secondary | ICD-10-CM | POA: Diagnosis not present

## 2018-09-12 DIAGNOSIS — Z872 Personal history of diseases of the skin and subcutaneous tissue: Secondary | ICD-10-CM | POA: Diagnosis not present

## 2018-09-12 DIAGNOSIS — D1801 Hemangioma of skin and subcutaneous tissue: Secondary | ICD-10-CM | POA: Diagnosis not present

## 2018-09-12 DIAGNOSIS — D2239 Melanocytic nevi of other parts of face: Secondary | ICD-10-CM | POA: Diagnosis not present

## 2018-09-12 DIAGNOSIS — L249 Irritant contact dermatitis, unspecified cause: Secondary | ICD-10-CM | POA: Diagnosis not present

## 2018-09-12 DIAGNOSIS — L814 Other melanin hyperpigmentation: Secondary | ICD-10-CM | POA: Diagnosis not present

## 2018-09-12 DIAGNOSIS — D2339 Other benign neoplasm of skin of other parts of face: Secondary | ICD-10-CM | POA: Diagnosis not present

## 2018-09-12 DIAGNOSIS — D229 Melanocytic nevi, unspecified: Secondary | ICD-10-CM | POA: Diagnosis not present

## 2018-11-25 ENCOUNTER — Other Ambulatory Visit: Payer: Self-pay

## 2018-11-25 ENCOUNTER — Other Ambulatory Visit: Payer: Medicare Other

## 2018-11-25 DIAGNOSIS — R739 Hyperglycemia, unspecified: Secondary | ICD-10-CM

## 2018-11-25 DIAGNOSIS — E781 Pure hyperglyceridemia: Secondary | ICD-10-CM

## 2018-11-25 DIAGNOSIS — E663 Overweight: Secondary | ICD-10-CM

## 2018-11-26 LAB — HEMOGLOBIN A1C
Hgb A1c MFr Bld: 5.6 % of total Hgb (ref <5.7)
Mean Plasma Glucose: 114 (calc)
eAG (mmol/L): 6.3 (calc)

## 2018-11-26 LAB — BASIC METABOLIC PANEL
BUN: 15 mg/dL (ref 7–25)
CO2: 27 mmol/L (ref 20–32)
Calcium: 9.4 mg/dL (ref 8.6–10.3)
Chloride: 106 mmol/L (ref 98–110)
Creat: 0.79 mg/dL (ref 0.70–1.18)
Glucose, Bld: 102 mg/dL — ABNORMAL HIGH (ref 65–99)
Potassium: 4.6 mmol/L (ref 3.5–5.3)
Sodium: 141 mmol/L (ref 135–146)

## 2018-11-26 LAB — SPECIMEN COMPROMISED

## 2018-11-26 LAB — LIPID PANEL
Cholesterol: 111 mg/dL (ref <200)
HDL: 35 mg/dL — ABNORMAL LOW (ref >=40)
LDL Cholesterol (Calc): 53 mg/dL (calc)
Non-HDL Cholesterol (Calc): 76 mg/dL (calc) (ref <130)
Total CHOL/HDL Ratio: 3.2 (calc) (ref <5.0)
Triglycerides: 148 mg/dL (ref <150)

## 2018-11-28 ENCOUNTER — Encounter: Payer: Self-pay | Admitting: Internal Medicine

## 2018-11-28 ENCOUNTER — Other Ambulatory Visit: Payer: Self-pay

## 2018-11-28 ENCOUNTER — Ambulatory Visit (INDEPENDENT_AMBULATORY_CARE_PROVIDER_SITE_OTHER): Payer: Medicare Other | Admitting: Internal Medicine

## 2018-11-28 VITALS — BP 150/90 | HR 60 | Temp 98.3°F | Ht 70.0 in | Wt 207.0 lb

## 2018-11-28 DIAGNOSIS — B0229 Other postherpetic nervous system involvement: Secondary | ICD-10-CM

## 2018-11-28 DIAGNOSIS — E781 Pure hyperglyceridemia: Secondary | ICD-10-CM

## 2018-11-28 DIAGNOSIS — R739 Hyperglycemia, unspecified: Secondary | ICD-10-CM | POA: Diagnosis not present

## 2018-11-28 DIAGNOSIS — E663 Overweight: Secondary | ICD-10-CM

## 2018-11-28 DIAGNOSIS — I10 Essential (primary) hypertension: Secondary | ICD-10-CM

## 2018-11-28 DIAGNOSIS — L309 Dermatitis, unspecified: Secondary | ICD-10-CM

## 2018-11-28 DIAGNOSIS — M1712 Unilateral primary osteoarthritis, left knee: Secondary | ICD-10-CM | POA: Diagnosis not present

## 2018-11-28 NOTE — Progress Notes (Signed)
Location:  [facility] clinic Provider:  Jermany Rimel L. Mariea Clonts, D.O., C.M.D.  Code Status: DNR Goals of Care:  Advanced Directives 05/23/2018  Does Patient Have a Medical Advance Directive? No  Would patient like information on creating a medical advance directive? No - Patient declined     Chief Complaint  Patient presents with  . Medical Management of Chronic Issues    27mth follow-up    HPI: Patient is a 73 y.o. male seen today for medical management of chronic diseases.    His labs are much better.  BP is up this morning--notes some claustrophobia.  Not walking as much amid covid.  Discussed that walking outside around the block like he did is still ok.   He has rash on lower legs that he's itched raw.    Also left knee more painful.  Cholesterol and sugar doing much better.  BP is high today. He's on cozaar and tolerating well.  He is claustrophobic and bothered by wearing the mask.  He also ate regular soy sauce on his food last night.  Right posterior thoracic area now has chronic itching after he had what sounds like shingles in that area.    Past Medical History:  Diagnosis Date  . Allergic rhinitis due to pollen   . Anxiety state, unspecified   . Asymptomatic varicose veins   . Atherosclerosis of native arteries of the extremities, unspecified   . Cataract 10/17/2016   L Eye  . Contact dermatitis and other eczema due to other chemical products   . 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 Flatulence, eructation, and gas pain   . Headache(784.0)   . Hemorrhoids, external   . Herpes zoster with unspecified complication   . Hyperpotassemia   . Insomnia, unspecified   . Lumbago   . Lumbago   . Myalgia and myositis, unspecified   . Nocturia   . Obesity, unspecified   . Osteoarthrosis, unspecified whether generalized or localized, unspecified site   . Other abnormal blood chemistry   . Other headache syndromes(339.89)   . Pure  hyperglyceridemia   . Rash and other nonspecific skin eruption   . Rotator cuff (capsule) sprain   . Routine general medical examination at a health care facility   . Tobacco use disorder   . Unspecified essential hypertension   . Unspecified hypertrophic and atrophic condition of skin   . Unspecified sleep apnea     Past Surgical History:  Procedure Laterality Date  . HEMORRHOID SURGERY    . VARICOSE VEIN SURGERY      Allergies  Allergen Reactions  . Celebrex [Celecoxib]   . Erythromycin   . Oxycontin [Oxycodone Hcl]   . Penicillins   . Tetracyclines & Related     Outpatient Encounter Medications as of 11/28/2018  Medication Sig  . loratadine (CLARITIN) 10 MG tablet TAKE 1 TABLET BY MOUTH DAILY AS NEEDED FOR ALLERGIES  . losartan (COZAAR) 50 MG tablet Take 1 tablet (50 mg total) by mouth daily. For blood pressure  . simvastatin (ZOCOR) 20 MG tablet TAKE 1 TABLET BY MOUTH EVERY DAY AT 6 PM FOR CHOLESTEROL  . traZODone (DESYREL) 100 MG tablet Take 1 tablet (100 mg total) by mouth at bedtime. For sleep and depression   No facility-administered encounter medications on file as of 11/28/2018.     Review of Systems:  Review of Systems  Constitutional: Negative for chills, fever and malaise/fatigue.  HENT: Positive for hearing loss. Negative for  congestion.   Eyes: Negative for blurred vision.       Glasses  Respiratory: Negative for cough, sputum production, shortness of breath and wheezing.   Cardiovascular: Negative for chest pain, palpitations and leg swelling.  Gastrointestinal: Negative for abdominal pain, blood in stool, constipation and melena.  Genitourinary: Negative for dysuria.  Musculoskeletal: Positive for joint pain. Negative for falls.  Skin: Positive for itching and rash.  Neurological: Negative for dizziness, loss of consciousness and weakness.  Psychiatric/Behavioral: Negative for depression and memory loss. The patient is nervous/anxious. The patient does  not have insomnia.     Health Maintenance  Topic Date Due  . INFLUENZA VACCINE  02/08/2019  . COLONOSCOPY  08/31/2020  . TETANUS/TDAP  06/14/2023  . Hepatitis C Screening  Completed  . PNA vac Low Risk Adult  Completed    Physical Exam: Vitals:   11/28/18 0830  BP: (!) 150/90  Pulse: 60  Temp: 98.3 F (36.8 C)  TempSrc: Oral  SpO2: 98%  Weight: 207 lb (93.9 kg)  Height: 5\' 10"  (1.778 m)   Body mass index is 29.7 kg/m. Physical Exam Vitals signs reviewed.  Constitutional:      General: He is not in acute distress.    Appearance: Normal appearance. He is not toxic-appearing.  HENT:     Head: Normocephalic and atraumatic.  Cardiovascular:     Rate and Rhythm: Normal rate and regular rhythm.     Pulses: Normal pulses.     Heart sounds: Normal heart sounds.  Pulmonary:     Effort: Pulmonary effort is normal.     Breath sounds: Normal breath sounds.  Musculoskeletal: Normal range of motion.     Comments: Left medial knee tender, mild effusion  Skin:    General: Skin is warm and dry.     Comments: Small red flat places on legs only which are excoriated  Neurological:     General: No focal deficit present.     Mental Status: He is alert and oriented to person, place, and time.  Psychiatric:        Mood and Affect: Mood normal.     Labs reviewed: Basic Metabolic Panel: Recent Labs    03/25/18 0810 07/24/18 0821 11/25/18 0818  NA 142 139 141  K 4.2 4.3 4.6  CL 109 106 106  CO2 27 27 27   GLUCOSE 112* 119* 102*  BUN 15 14 15   CREATININE 0.60* 0.69* 0.79  CALCIUM 9.5 9.2 9.4   Liver Function Tests: No results for input(s): AST, ALT, ALKPHOS, BILITOT, PROT, ALBUMIN in the last 8760 hours. No results for input(s): LIPASE, AMYLASE in the last 8760 hours. No results for input(s): AMMONIA in the last 8760 hours. CBC: Recent Labs    03/25/18 0810  WBC 6.0  NEUTROABS 3,696  HGB 14.6  HCT 44.3  MCV 91.7  PLT 224   Lipid Panel: Recent Labs     03/25/18 0810 07/24/18 0821 11/25/18 0818  CHOL 109 98 111  HDL 41 40* 35*  LDLCALC 48 44 53  TRIG 112 67 148  CHOLHDL 2.7 2.5 3.2   Lab Results  Component Value Date   HGBA1C 5.6 11/25/2018    Assessment/Plan 1. Postherpetic neuralgia -sounds like last month he had shingles on his right thoracic posterior region and now has chronic itching in that area so suggested use of otc lidocaine patches there for relief  2. Primary osteoarthritis of left knee - discussed topical therapy along with tylenol -suggested injection but  he does not want this  3. Hypertriglyceridemia -has resolved with meds and diet and exercise, get back to exercise  4. Hyperglycemia - get back to exercise - back in normal range though now - CBC with Differential/Platelet; Future - COMPLETE METABOLIC PANEL WITH GFR; Future - Hemoglobin A1c; Future  5. Overweight (BMI 25.0-29.9) -ongoing, encouraged him to get back to walking outdoors - CBC with Differential/Platelet; Future - Hemoglobin A1c; Future  6. Dermatitis -possibly chiggers from mowing, is improving; if continues, seek derm appt  7. Uncontrolled hypertension -counseled on low sodium diet -suspect some was due to anxiety -cont ARB - COMPLETE METABOLIC PANEL WITH GFR; Future  Labs/tests ordered:   Orders Placed This Encounter  Procedures  . CBC with Differential/Platelet    Standing Status:   Future    Standing Expiration Date:   11/28/2019  . COMPLETE METABOLIC PANEL WITH GFR    Standing Status:   Future    Standing Expiration Date:   11/28/2019  . Hemoglobin A1c    Standing Status:   Future    Standing Expiration Date:   11/28/2019    Next appt:  12/24/2018   Angie Hogg L. Duyen Beckom, D.O. [facility] [address], [place] [address] Cell Phone (Mon-Fri 8am-5pm):  [phone] On Call:  [phone] & follow prompts after 5pm & weekends Office Phone:  [phone] Office Fax:   [phone]

## 2018-11-28 NOTE — Patient Instructions (Signed)
You may try salonpas with lidocaine patches over the right side of your back where the shingles were.  Also, for your left knee, it may help to use aspercreme or similar products for the pain.  Tylenol is also ok as you've tried.

## 2018-12-23 ENCOUNTER — Encounter: Payer: Medicare Other | Admitting: Family

## 2018-12-23 ENCOUNTER — Ambulatory Visit: Payer: Self-pay

## 2018-12-24 ENCOUNTER — Encounter: Payer: Medicare Other | Admitting: Family

## 2018-12-24 ENCOUNTER — Ambulatory Visit (INDEPENDENT_AMBULATORY_CARE_PROVIDER_SITE_OTHER): Payer: Medicare Other | Admitting: Family

## 2018-12-24 ENCOUNTER — Other Ambulatory Visit: Payer: Self-pay

## 2018-12-24 ENCOUNTER — Encounter: Payer: Self-pay | Admitting: Family

## 2018-12-24 VITALS — BP 140/74 | HR 62 | Temp 98.2°F | Ht 70.0 in | Wt 205.4 lb

## 2018-12-24 DIAGNOSIS — Z0000 Encounter for general adult medical examination without abnormal findings: Secondary | ICD-10-CM

## 2018-12-24 NOTE — Progress Notes (Signed)
Subjective:   Kamdyn Colborn. is a 73 y.o. male who presents for Medicare Annual/Subsequent preventive examination.  Review of Systems:  Cardiac Risk Factors include: advanced age (>83men, >57 women);hypertension;smoking/ tobacco exposure;male gender;sedentary lifestyle;dyslipidemia     Objective:    Vitals: BP 140/74   Pulse 62   Temp 98.2 F (36.8 C) (Oral)   Ht 5\' 10"  (1.778 m)   Wt 205 lb 6.4 oz (93.2 kg)   SpO2 98%   BMI 29.47 kg/m   Body mass index is 29.47 kg/m.  Advanced Directives 12/24/2018 05/23/2018 12/20/2017 11/12/2017 11/11/2017 12/08/2016 11/20/2016  Does Patient Have a Medical Advance Directive? No No No No No No No  Would patient like information on creating a medical advance directive? - No - Patient declined Yes (MAU/Ambulatory/Procedural Areas - Information given) No - Patient declined No - Patient declined - No - Patient declined    Tobacco Social History   Tobacco Use  Smoking Status Former Smoker  . Packs/day: 1.50  . Years: 55.00  . Pack years: 82.50  . Types: Cigarettes  . Quit date: 07/30/2015  . Years since quitting: 3.4  Smokeless Tobacco Never Used  Tobacco Comment   pt thinks increased effexor dose helped him to quit     Counseling given: Not Answered Comment: pt thinks increased effexor dose helped him to quit   Clinical Intake:  Pre-visit preparation completed: No  Pain : No/denies pain     BMI - recorded: 29.47 Nutritional Status: BMI 25 -29 Overweight Nutritional Risks: None Diabetes: No  How often do you need to have someone help you when you read instructions, pamphlets, or other written materials from your doctor or pharmacy?: 1 - Never What is the last grade level you completed in school?: 12 grade  Interpreter Needed?: No  Information entered by :: Dinah Ngetich FNP-C  Past Medical History:  Diagnosis Date  . Allergic rhinitis due to pollen   . Anxiety state, unspecified   . Asymptomatic varicose veins   .  Atherosclerosis of native arteries of the extremities, unspecified   . Cataract 10/17/2016   L Eye  . Contact dermatitis and other eczema due to other chemical products   . 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 Flatulence, eructation, and gas pain   . Headache(784.0)   . Hemorrhoids, external   . Herpes zoster with unspecified complication   . Hyperpotassemia   . Insomnia, unspecified   . Lumbago   . Lumbago   . Myalgia and myositis, unspecified   . Nocturia   . Obesity, unspecified   . Osteoarthrosis, unspecified whether generalized or localized, unspecified site   . Other abnormal blood chemistry   . Other headache syndromes(339.89)   . Pure hyperglyceridemia   . Rash and other nonspecific skin eruption   . Rotator cuff (capsule) sprain   . Routine general medical examination at a health care facility   . Tobacco use disorder   . Unspecified essential hypertension   . Unspecified hypertrophic and atrophic condition of skin   . Unspecified sleep apnea    Past Surgical History:  Procedure Laterality Date  . HEMORRHOID SURGERY    . VARICOSE VEIN SURGERY     Family History  Problem Relation Age of Onset  . Diabetes Mother   . Heart disease Mother   . Stroke Mother   . Stroke Father    Social History   Socioeconomic History  . Marital status: Married  Spouse name: Not on file  . Number of children: Not on file  . Years of education: Not on file  . Highest education level: Not on file  Occupational History  . Not on file  Social Needs  . Financial resource strain: Somewhat hard  . Food insecurity    Worry: Sometimes true    Inability: Sometimes true  . Transportation needs    Medical: No    Non-medical: No  Tobacco Use  . Smoking status: Former Smoker    Packs/day: 1.50    Years: 55.00    Pack years: 82.50    Types: Cigarettes    Quit date: 07/30/2015    Years since quitting: 3.4  . Smokeless tobacco: Never Used  . Tobacco  comment: pt thinks increased effexor dose helped him to quit  Substance and Sexual Activity  . Alcohol use: No    Alcohol/week: 0.0 standard drinks  . Drug use: No  . Sexual activity: Not on file  Lifestyle  . Physical activity    Days per week: 7 days    Minutes per session: 20 min  . Stress: Rather much  Relationships  . Social Herbalist on phone: Three times a week    Gets together: Three times a week    Attends religious service: Never    Active member of club or organization: No    Attends meetings of clubs or organizations: Never    Relationship status: Married  Other Topics Concern  . Not on file  Social History Narrative   Married   Stopped smoking 07/30/15   Alcohol none   Exercise none   [employer] [phone]     Outpatient Encounter Medications as of 12/24/2018  Medication Sig  . loratadine (CLARITIN) 10 MG tablet TAKE 1 TABLET BY MOUTH DAILY AS NEEDED FOR ALLERGIES  . losartan (COZAAR) 50 MG tablet Take 1 tablet (50 mg total) by mouth daily. For blood pressure  . simvastatin (ZOCOR) 20 MG tablet TAKE 1 TABLET BY MOUTH EVERY DAY AT 6 PM FOR CHOLESTEROL  . traZODone (DESYREL) 100 MG tablet Take 1 tablet (100 mg total) by mouth at bedtime. For sleep and depression   No facility-administered encounter medications on file as of 12/24/2018.     Activities of Daily Living In your present state of health, do you have any difficulty performing the following activities: 12/24/2018  Hearing? N  Vision? N  Difficulty concentrating or making decisions? N  Walking or climbing stairs? N  Dressing or bathing? N  Doing errands, shopping? N  Preparing Food and eating ? N  Using the Toilet? N  In the past six months, have you accidently leaked urine? N  Do you have problems with loss of bowel control? N  Managing your Medications? N  Managing your Finances? N  Housekeeping or managing your Housekeeping? N  Some recent data might be hidden    Patient Care Team:  Gayland Curry, DO as PCP - General (Geriatric Medicine) Katy Apo, MD as Consulting Physician (Ophthalmology)   Assessment:   This is a routine wellness examination for Gwyndolyn Saxon.  Exercise Activities and Dietary recommendations Current Exercise Habits: The patient does not participate in regular exercise at present, Exercise limited by: None identified  Goals    . lose weight     Starting 11/16/16 I will start trying to walk to lose weight.     . Quit smoking / using tobacco     Has  quit smoking since 2017        Fall Risk Fall Risk  12/24/2018 07/29/2018 03/28/2018 12/20/2017 11/12/2017  Falls in the past year? 0 0 No Yes No  Number falls in past yr: 0 0 - 2 or more -  Injury with Fall? 0 0 - No -   Is the patient's home free of loose throw rugs in walkways, pet beds, electrical cords, etc?   no      Grab bars in the bathroom? no      Handrails on the stairs?   no      Adequate lighting?   yes  Depression Screen PHQ 2/9 Scores 12/24/2018 07/29/2018 03/28/2018 12/20/2017  PHQ - 2 Score 0 0 0 1  PHQ- 9 Score - - - -    Cognitive Function MMSE - Mini Mental State Exam 12/24/2018 12/20/2017 11/16/2016 11/08/2015 04/10/2014  Orientation to time 5 5 5 5 5   Orientation to Place 5 5 5 5 5   Registration 3 3 3 3 3   Attention/ Calculation 5 5 5 5 5   Recall 3 3 0 1 1  Language- name 2 objects 2 2 2 2 2   Language- repeat 1 1 1 1 1   Language- follow 3 step command 3 3 3 3 3   Language- read & follow direction 1 1 1 1 1   Write a sentence 1 1 1 1 1   Copy design 1 1 0 1 1  Total score 30 30 26 28 28         Immunization History  Administered Date(s) Administered  . Influenza, High Dose Seasonal PF 04/03/2017, 03/28/2018  . Influenza,inj,Quad PF,6+ Mos 06/13/2013, 04/10/2014, 05/06/2015, 05/12/2016  . Influenza-Unspecified 04/09/2010, 05/09/2012  . Pneumococcal Conjugate-13 04/16/2014  . Pneumococcal Polysaccharide-23 01/26/2011, 11/25/2015  . Td 07/10/2000, 06/13/2013  . Zoster  08/05/2012  . Zoster Recombinat (Shingrix) 11/20/2016, 03/28/2017    Qualifies for Shingles Vaccine? Up to date   Screening Tests Health Maintenance  Topic Date Due  . INFLUENZA VACCINE  02/08/2019  . COLONOSCOPY  08/31/2020  . TETANUS/TDAP  06/14/2023  . Hepatitis C Screening  Completed  . PNA vac Low Risk Adult  Completed   Cancer Screenings: Lung: Low Dose CT Chest recommended if Age 59-80 years, 30 pack-year currently smoking OR have quit w/in 15years. Patient does not qualify. Colorectal: Up to date   Additional Screenings: Hepatitis C Screening:Low Risk      Plan:   - recommend low carbohydrate,low saturated fats and high vegetable diet and exercise at least three times per week x 30 minutes.   I have personally reviewed and noted the following in the patient's chart:   . Medical and social history . Use of alcohol, tobacco or illicit drugs  . Current medications and supplements . Functional ability and status . Nutritional status . Physical activity . Advanced directives . List of other physicians . Hospitalizations, surgeries, and ER visits in previous 12 months . Vitals . Screenings to include cognitive, depression, and falls . Referrals and appointments  In addition, I have reviewed and discussed with patient certain preventive protocols, quality metrics, and best practice recommendations. A written personalized care plan for preventive services as well as general preventive health recommendations were provided to patient.   Sandrea Hughs, NP  12/24/2018

## 2018-12-24 NOTE — Patient Instructions (Signed)
David [name] , Thank you for taking time to come for your Medicare Wellness Visit. I appreciate your ongoing commitment to your health goals. Please review the following plan we discussed and let me know if I can assist you in the future.   Screening recommendations/referrals: Colonoscopy Up to date due 08/31/2020 Recommended yearly ophthalmology/optometry visit for glaucoma screening and checkup Recommended yearly dental visit for hygiene and checkup  Vaccinations: Influenza vaccine Up to date Pneumococcal vaccine Up to date Tdap vaccineUp to date due 2024  Shingles vaccine Up to date    Advanced directives: No   Conditions/risks identified: Adavnce age men > 2 yrs,Hypertension,Hx smoking,male Gender,sedentary life style,dyslipidemia   Next appointment: 1 year   Preventive Care 13 Years and Older, Male Preventive care refers to lifestyle choices and visits with your health care provider that can promote health and wellness. What does preventive care include?  A yearly physical exam. This is also called an annual well check.  Dental exams once or twice a year.  Routine eye exams. Ask your health care provider how often you should have your eyes checked.  Personal lifestyle choices, including:  Daily care of your teeth and gums.  Regular physical activity.  Eating a healthy diet.  Avoiding tobacco and drug use.  Limiting alcohol use.  Practicing safe sex.  Taking low doses of aspirin every day.  Taking vitamin and mineral supplements as recommended by your health care provider. What happens during an annual well check? The services and screenings done by your health care provider during your annual well check will depend on your age, overall health, lifestyle risk factors, and family history of disease. Counseling  Your health care provider may ask you questions about your:  Alcohol use.  Tobacco use.  Drug use.  Emotional well-being.  Home and relationship  well-being.  Sexual activity.  Eating habits.  History of falls.  Memory and ability to understand (cognition).  Work and work Statistician. Screening  You may have the following tests or measurements:  Height, weight, and BMI.  Blood pressure.  Lipid and cholesterol levels. These may be checked every 5 years, or more frequently if you are over 87 years old.  Skin check.  Lung cancer screening. You may have this screening every year starting at age 65 if you have a 30-pack-year history of smoking and currently smoke or have quit within the past 15 years.  Fecal occult blood test (FOBT) of the stool. You may have this test every year starting at age 66.  Flexible sigmoidoscopy or colonoscopy. You may have a sigmoidoscopy every 5 years or a colonoscopy every 10 years starting at age 73.  Prostate cancer screening. Recommendations will vary depending on your family history and other risks.  Hepatitis C blood test.  Hepatitis B blood test.  Sexually transmitted disease (STD) testing.  Diabetes screening. This is done by checking your blood sugar (glucose) after you have not eaten for a while (fasting). You may have this done every 1-3 years.  Abdominal aortic aneurysm (AAA) screening. You may need this if you are a current or former smoker.  Osteoporosis. You may be screened starting at age 42 if you are at high risk. Talk with your health care provider about your test results, treatment options, and if necessary, the need for more tests. Vaccines  Your health care provider may recommend certain vaccines, such as:  Influenza vaccine. This is recommended every year.  Tetanus, diphtheria, and acellular pertussis (Tdap, Td) vaccine.  You may need a Td booster every 10 years.  Zoster vaccine. You may need this after age 85.  Pneumococcal 13-valent conjugate (PCV13) vaccine. One dose is recommended after age 75.  Pneumococcal polysaccharide (PPSV23) vaccine. One dose is  recommended after age 44. Talk to your health care provider about which screenings and vaccines you need and how often you need them. This information is not intended to replace advice given to you by your health care provider. Make sure you discuss any questions you have with your health care provider. Document Released: 07/23/2015 Document Revised: 03/15/2016 Document Reviewed: 04/27/2015 Elsevier Interactive Patient Education  2017 [place] Prevention in the Home Falls can cause injuries. They can happen to people of all ages. There are many things you can do to make your home safe and to help prevent falls. What can I do on the outside of my home?  Regularly fix the edges of walkways and driveways and fix any cracks.  Remove anything that might make you trip as you walk through a door, such as a raised step or threshold.  Trim any bushes or trees on the path to your home.  Use bright outdoor lighting.  Clear any walking paths of anything that might make someone trip, such as rocks or tools.  Regularly check to see if handrails are loose or broken. Make sure that both sides of any steps have handrails.  Any raised decks and porches should have guardrails on the edges.  Have any leaves, snow, or ice cleared regularly.  Use sand or salt on walking paths during winter.  Clean up any spills in your garage right away. This includes oil or grease spills. What can I do in the bathroom?  Use night lights.  Install grab bars by the toilet and in the tub and shower. Do not use towel bars as grab bars.  Use non-skid mats or decals in the tub or shower.  If you need to sit down in the shower, use a plastic, non-slip stool.  Keep the floor dry. Clean up any water that spills on the floor as soon as it happens.  Remove soap buildup in the tub or shower regularly.  Attach bath mats securely with double-sided non-slip rug tape.  Do not have throw rugs and other things on  the floor that can make you trip. What can I do in the bedroom?  Use night lights.  Make sure that you have a light by your bed that is easy to reach.  Do not use any sheets or blankets that are too big for your bed. They should not hang down onto the floor.  Have a firm chair that has side arms. You can use this for support while you get dressed.  Do not have throw rugs and other things on the floor that can make you trip. What can I do in the kitchen?  Clean up any spills right away.  Avoid walking on wet floors.  Keep items that you use a lot in easy-to-reach places.  If you need to reach something above you, use a strong step stool that has a grab bar.  Keep electrical cords out of the way.  Do not use floor polish or wax that makes floors slippery. If you must use wax, use non-skid floor wax.  Do not have throw rugs and other things on the floor that can make you trip. What can I do with my stairs?  Do not leave any  items on the stairs.  Make sure that there are handrails on both sides of the stairs and use them. Fix handrails that are broken or loose. Make sure that handrails are as long as the stairways.  Check any carpeting to make sure that it is firmly attached to the stairs. Fix any carpet that is loose or worn.  Avoid having throw rugs at the top or bottom of the stairs. If you do have throw rugs, attach them to the floor with carpet tape.  Make sure that you have a light switch at the top of the stairs and the bottom of the stairs. If you do not have them, ask someone to add them for you. What else can I do to help prevent falls?  Wear shoes that:  Do not have high heels.  Have rubber bottoms.  Are comfortable and fit you well.  Are closed at the toe. Do not wear sandals.  If you use a stepladder:  Make sure that it is fully opened. Do not climb a closed stepladder.  Make sure that both sides of the stepladder are locked into place.  Ask someone to  hold it for you, if possible.  Clearly mark and make sure that you can see:  Any grab bars or handrails.  First and last steps.  Where the edge of each step is.  Use tools that help you move around (mobility aids) if they are needed. These include:  Canes.  Walkers.  Scooters.  Crutches.  Turn on the lights when you go into a dark area. Replace any light bulbs as soon as they burn out.  Set up your furniture so you have a clear path. Avoid moving your furniture around.  If any of your floors are uneven, fix them.  If there are any pets around you, be aware of where they are.  Review your medicines with your doctor. Some medicines can make you feel dizzy. This can increase your chance of falling. Ask your doctor what other things that you can do to help prevent falls. This information is not intended to replace advice given to you by your health care provider. Make sure you discuss any questions you have with your health care provider. Document Released: 04/22/2009 Document Revised: 12/02/2015 Document Reviewed: 07/31/2014 Elsevier Interactive Patient Education  2017 [employer].

## 2019-01-10 ENCOUNTER — Other Ambulatory Visit: Payer: Self-pay | Admitting: Internal Medicine

## 2019-01-10 DIAGNOSIS — R739 Hyperglycemia, unspecified: Secondary | ICD-10-CM

## 2019-01-10 DIAGNOSIS — E781 Pure hyperglyceridemia: Secondary | ICD-10-CM

## 2019-02-13 ENCOUNTER — Other Ambulatory Visit: Payer: Self-pay | Admitting: Internal Medicine

## 2019-02-13 DIAGNOSIS — I10 Essential (primary) hypertension: Secondary | ICD-10-CM

## 2019-02-17 DIAGNOSIS — H04121 Dry eye syndrome of right lacrimal gland: Secondary | ICD-10-CM | POA: Diagnosis not present

## 2019-02-17 DIAGNOSIS — Z961 Presence of intraocular lens: Secondary | ICD-10-CM | POA: Diagnosis not present

## 2019-02-17 DIAGNOSIS — H52202 Unspecified astigmatism, left eye: Secondary | ICD-10-CM | POA: Diagnosis not present

## 2019-03-18 DIAGNOSIS — L821 Other seborrheic keratosis: Secondary | ICD-10-CM | POA: Diagnosis not present

## 2019-03-18 DIAGNOSIS — D1801 Hemangioma of skin and subcutaneous tissue: Secondary | ICD-10-CM | POA: Diagnosis not present

## 2019-03-18 DIAGNOSIS — L249 Irritant contact dermatitis, unspecified cause: Secondary | ICD-10-CM | POA: Diagnosis not present

## 2019-03-18 DIAGNOSIS — Z85820 Personal history of malignant melanoma of skin: Secondary | ICD-10-CM | POA: Diagnosis not present

## 2019-03-18 DIAGNOSIS — D225 Melanocytic nevi of trunk: Secondary | ICD-10-CM | POA: Diagnosis not present

## 2019-03-27 ENCOUNTER — Other Ambulatory Visit: Payer: Medicare Other

## 2019-03-27 ENCOUNTER — Other Ambulatory Visit: Payer: Self-pay

## 2019-03-27 DIAGNOSIS — R739 Hyperglycemia, unspecified: Secondary | ICD-10-CM

## 2019-03-27 DIAGNOSIS — I10 Essential (primary) hypertension: Secondary | ICD-10-CM

## 2019-03-27 DIAGNOSIS — E663 Overweight: Secondary | ICD-10-CM

## 2019-03-28 LAB — CBC WITH DIFFERENTIAL/PLATELET
Absolute Monocytes: 535 cells/uL (ref 200–950)
Basophils Absolute: 49 cells/uL (ref 0–200)
Basophils Relative: 0.9 %
Eosinophils Absolute: 108 cells/uL (ref 15–500)
Eosinophils Relative: 2 %
HCT: 43.8 % (ref 38.5–50.0)
Hemoglobin: 14.6 g/dL (ref 13.2–17.1)
Lymphs Abs: 1150 cells/uL (ref 850–3900)
MCH: 30.9 pg (ref 27.0–33.0)
MCHC: 33.3 g/dL (ref 32.0–36.0)
MCV: 92.6 fL (ref 80.0–100.0)
MPV: 12.1 fL (ref 7.5–12.5)
Monocytes Relative: 9.9 %
Neutro Abs: 3559 cells/uL (ref 1500–7800)
Neutrophils Relative %: 65.9 %
Platelets: 178 10*3/uL (ref 140–400)
RBC: 4.73 10*6/uL (ref 4.20–5.80)
RDW: 12.3 % (ref 11.0–15.0)
Total Lymphocyte: 21.3 %
WBC: 5.4 10*3/uL (ref 3.8–10.8)

## 2019-03-28 LAB — HEMOGLOBIN A1C
Hgb A1c MFr Bld: 5.5 % of total Hgb (ref <5.7)
Mean Plasma Glucose: 111 (calc)
eAG (mmol/L): 6.2 (calc)

## 2019-03-28 LAB — COMPLETE METABOLIC PANEL WITH GFR
AG Ratio: 1.6 (calc) (ref 1.0–2.5)
ALT: 11 U/L (ref 9–46)
AST: 16 U/L (ref 10–35)
Albumin: 4.1 g/dL (ref 3.6–5.1)
Alkaline phosphatase (APISO): 60 U/L (ref 35–144)
BUN: 16 mg/dL (ref 7–25)
CO2: 27 mmol/L (ref 20–32)
Calcium: 9.1 mg/dL (ref 8.6–10.3)
Chloride: 105 mmol/L (ref 98–110)
Creat: 0.85 mg/dL (ref 0.70–1.18)
GFR, Est African American: 100 mL/min/{1.73_m2} (ref >=60)
GFR, Est Non African American: 86 mL/min/{1.73_m2} (ref >=60)
Globulin: 2.6 g/dL (calc) (ref 1.9–3.7)
Glucose, Bld: 115 mg/dL — ABNORMAL HIGH (ref 65–99)
Potassium: 4.6 mmol/L (ref 3.5–5.3)
Sodium: 140 mmol/L (ref 135–146)
Total Bilirubin: 0.5 mg/dL (ref 0.2–1.2)
Total Protein: 6.7 g/dL (ref 6.1–8.1)

## 2019-03-31 ENCOUNTER — Encounter: Payer: Self-pay | Admitting: Internal Medicine

## 2019-03-31 ENCOUNTER — Ambulatory Visit (INDEPENDENT_AMBULATORY_CARE_PROVIDER_SITE_OTHER): Payer: Medicare Other | Admitting: Internal Medicine

## 2019-03-31 ENCOUNTER — Other Ambulatory Visit: Payer: Self-pay

## 2019-03-31 VITALS — BP 130/78 | HR 64 | Temp 98.3°F | Ht 70.0 in | Wt 190.4 lb

## 2019-03-31 DIAGNOSIS — Z23 Encounter for immunization: Secondary | ICD-10-CM | POA: Diagnosis not present

## 2019-03-31 DIAGNOSIS — Z9109 Other allergy status, other than to drugs and biological substances: Secondary | ICD-10-CM

## 2019-03-31 DIAGNOSIS — F5105 Insomnia due to other mental disorder: Secondary | ICD-10-CM | POA: Diagnosis not present

## 2019-03-31 DIAGNOSIS — W57XXXA Bitten or stung by nonvenomous insect and other nonvenomous arthropods, initial encounter: Secondary | ICD-10-CM

## 2019-03-31 DIAGNOSIS — E781 Pure hyperglyceridemia: Secondary | ICD-10-CM

## 2019-03-31 DIAGNOSIS — R739 Hyperglycemia, unspecified: Secondary | ICD-10-CM

## 2019-03-31 DIAGNOSIS — F331 Major depressive disorder, recurrent, moderate: Secondary | ICD-10-CM | POA: Diagnosis not present

## 2019-03-31 DIAGNOSIS — F99 Mental disorder, not otherwise specified: Secondary | ICD-10-CM

## 2019-03-31 MED ORDER — SIMVASTATIN 20 MG PO TABS: 20.0000 mg | ORAL_TABLET | Freq: Every day | ORAL | 3 refills | Status: DC

## 2019-03-31 MED ORDER — LORATADINE 10 MG PO TABS: ORAL_TABLET | ORAL | 3 refills | Status: DC

## 2019-03-31 MED ORDER — CLOBETASOL PROPIONATE 0.05 % EX OINT: 1.0000 "application " | TOPICAL_OINTMENT | Freq: Two times a day (BID) | CUTANEOUS | 0 refills | Status: DC

## 2019-03-31 MED ORDER — TRAZODONE HCL 100 MG PO TABS: 100.0000 mg | ORAL_TABLET | Freq: Every day | ORAL | 3 refills | Status: DC

## 2019-03-31 NOTE — Patient Instructions (Addendum)
Please resume your walking in the cooler weather.  Continue your claritin daily. Use the anti-itch cream.   You must use one of the bombs to get ride of the bedbugs and wash all of your sheets and clothes in hot water.     Bedbugs  Bedbugs are tiny bugs that live in and around beds. During the day, they stay hidden. At night, they come out and bite. Where are bedbugs found? Bedbugs can be found anywhere. It does not matter if a place is clean or dirty. Bedbugs are found in:  Hotels.  Shelters.  Dorms.  Hospitals.  Nursing homes.  Places where there are many birds or bats. What are bedbug bites like?  A bedbug bite makes a small red bump with a darker red dot in the middle. The bump may show soon after you are bitten or one or more days later. Bedbug bites usually do not hurt, but they may itch. Most people do not need treatment for bedbug bites. The bumps usually go away on their own in a few days. If you have a lot of bedbug bites and they feel very itchy:  Do not scratch the bite areas.  You may put one of these on the bite area as told by your doctor: ? Baking soda paste. Make this by adding water to baking soda. ? Cortisone cream. ? Calamine lotion. How do I check for bedbugs? Adult bedbugs are reddish-brown, oval, and flat. They are very small, and they cannot fly. Young bedbugs are whitish-yellow and are smaller than the adult bedbugs. Use a flashlight to look for bedbugs in these places:  On mattresses, bed frames, headboards, and box springs.  On drapes and curtains in bedrooms.  Under the carpet in bedrooms.  Behind electrical outlets.  Behind any wallpaper that is peeling.  Inside luggage. Also look for black or red spots or stains on or near the bed. What should I do if I find bedbugs? When traveling Check your clothes, suitcase, and belongings for bedbugs before you go back home. You may want to throw away anything that has bedbugs on it. At  home Your bedroom may need to be treated by a pest control expert. You may also need to throw away mattresses or luggage. To help stop bedbugs from coming back, you may want to:  Wash your clothes and bedding in water that is hotter than 120F (48.9C). Dry them on a hot setting.  Put a plastic cover over your mattress.  When you sleep, wear pajamas that have long sleeves and pant legs. Bedbugs usually bite skin that is not covered.  Vacuum often around the bed and in all of the cracks where the bugs might hide.  Check all used furniture, bedding, or clothes that you bring into your home.  Get rid of bird nests and bat roosts that are near your home. Where to find more information  [employer], operational (EPA): [email or web address] Summary  Bedbugs are tiny bugs that live in and around beds.  Bedbugs are often found in hotels, shelters, dorms, hospitals, and nursing homes.  A bedbug bite makes a small red bump with a darker red dot in the middle.  Bedbug bites usually do not hurt, but they may itch. Most people do not need treatment for the bites.  If you find bedbugs at home, your bedroom may need to be treated by a pest control expert. This information is not intended to replace advice given to you by  your health care provider. Make sure you discuss any questions you have with your health care provider. Document Released: 10/11/2010 Document Revised: 06/08/2017 Document Reviewed: 02/16/2017 Elsevier Patient Education  2020 [employer].

## 2019-03-31 NOTE — Progress Notes (Signed)
Location:  [facility]   Advanced Directives 12/24/2018  Does Patient Have a Medical Advance Directive? No  Would patient like information on creating a medical advance directive? -     Chief Complaint  Patient presents with   Medical Management of Chronic Issues    4 month followup   Immunizations    Flu shot given today    HPI: Patient is a 73 y.o. male seen today for medical management of chronic diseases.    Lab results reviewed with patient.   He has lost over 10 pounds since last visit due to smaller portion sizes. Diet is low in salt and fat. No exercise due to the summer heat. Expresses the need to starting walking with the weather being cooler.   Has a history of bed bug rash. First episode occurred last year and had to fumigate his bedroom. Today, he presents with another episode of bedbug rash. This rash started about 10-12 days ago. He has physically seen bedbugs in and around his bed. He has only washed his sheets in hot water. He has not fumigated his rooms or contacted a specialist. Rash is very itchy, especially at night. He has tried tricimalone cream to help with the itch with no success. He is also taking clairtin daily.   Last dental visit was March 2020, due to loose teeth and dental pain. He cannot afford to have a partial denture placed at this time.   Last eye exam was July 2020.   No falls or injuries reported.   Having trouble sleeping at night. He is worried about caring for his wife, who has been having short-term memory issues. He averages 3-5 hours of sleep a night. Taking his trazodone daily.   The role of caregiver to his wife has not increased his depression. He expresses worry about the future and how to take care of her is her memory issues progress. He still has many days where he is happy with his wife and able to do activities with her and laugh.    Requesting flu vaccination today.     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    Flatulence, eructation, and gas pain    Headache(784.0)    Hemorrhoids, external    Herpes zoster with unspecified complication    Hyperpotassemia    Insomnia, unspecified    Lumbago    Lumbago    Myalgia and myositis, unspecified    Nocturia    Obesity, unspecified    Osteoarthrosis, unspecified whether generalized or localized, unspecified site    Other abnormal blood chemistry    Other headache syndromes(339.89)    Pure hyperglyceridemia    Rash and other nonspecific skin eruption    Rotator cuff (capsule) sprain    Routine general medical examination at a health care facility    Tobacco use disorder    Unspecified essential hypertension    Unspecified hypertrophic and atrophic condition of skin    Unspecified sleep apnea     Past Surgical History:  Procedure Laterality Date   HEMORRHOID SURGERY     VARICOSE VEIN SURGERY  Allergies  Allergen Reactions   Celebrex [Celecoxib]    Erythromycin    Oxycontin [Oxycodone Hcl]    Penicillins    Tetracyclines & Related     Outpatient Encounter Medications as of 03/31/2019  Medication Sig   loratadine (CLARITIN) 10 MG tablet TAKE 1 TABLET BY MOUTH DAILY AS NEEDED FOR ALLERGIES   losartan (COZAAR) 50 MG tablet TAKE 1 TABLET(50 MG) BY MOUTH DAILY FOR BLOOD PRESSURE   simvastatin (ZOCOR) 20 MG tablet TAKE 1 TABLET BY MOUTH EVERY DAY AT 6 PM FOR CHOLESTEROL   traZODone (DESYREL) 100 MG tablet Take 1 tablet (100 mg total) by mouth at bedtime. For sleep and depression   [DISCONTINUED] loratadine (CLARITIN) 10 MG tablet TAKE 1 TABLET BY MOUTH DAILY AS NEEDED  FOR ALLERGIES   [DISCONTINUED] traZODone (DESYREL) 100 MG tablet Take 1 tablet (100 mg total) by mouth at bedtime. For sleep and depression   No facility-administered encounter medications on file as of 03/31/2019.     Review of Systems:  Review of Systems  Constitutional: Negative for activity change, appetite change and fatigue.  HENT: Positive for dental problem. Negative for hearing loss and trouble swallowing.        Loose teeth  Eyes: Negative for photophobia and visual disturbance.  Respiratory: Negative for cough, shortness of breath and wheezing.   Cardiovascular: Negative for chest pain and leg swelling.  Gastrointestinal: Negative for abdominal pain, constipation, diarrhea and nausea.  Endocrine: Negative for polydipsia, polyphagia and polyuria.  Genitourinary: Negative for dysuria, frequency and hematuria.  Musculoskeletal: Positive for arthralgias and back pain. Negative for joint swelling.  Skin: Positive for rash.  Neurological: Negative for dizziness and headaches.  Psychiatric/Behavioral: Positive for sleep disturbance. Negative for dysphoric mood. The patient is nervous/anxious.     Health Maintenance  Topic Date Due   COLONOSCOPY  08/31/2020   TETANUS/TDAP  06/14/2023   INFLUENZA VACCINE  Completed   Hepatitis C Screening  Completed   PNA vac Low Risk Adult  Completed    Physical Exam: Vitals:   03/31/19 0901  BP: 130/78  Pulse: 64  Temp: 98.3 F (36.8 C)  TempSrc: Oral  SpO2: 96%  Weight: 190 lb 6.4 oz (86.4 kg)  Height: 5\' 10"  (1.778 m)   Body mass index is 27.32 kg/m. Physical Exam Vitals signs reviewed.  Constitutional:      General: He is not in acute distress.    Appearance: Normal appearance. He is normal weight.  HENT:     Head: Normocephalic.     Mouth/Throat:     Mouth: Mucous membranes are moist.     Comments: Missing teeth. Bottom teeth loose Eyes:     Conjunctiva/sclera: Conjunctivae normal.  Cardiovascular:     Rate and  Rhythm: Normal rate and regular rhythm.     Pulses: Normal pulses.     Heart sounds: No murmur.  Pulmonary:     Effort: Pulmonary effort is normal. No respiratory distress.     Breath sounds: Normal breath sounds. No wheezing.  Abdominal:     General: Bowel sounds are normal.     Palpations: Abdomen is soft.  Musculoskeletal:     Right lower leg: No edema.     Left lower leg: No edema.  Skin:    Capillary Refill: Capillary refill takes less than 2 seconds.     Findings: Rash present.       Neurological:     General: No focal deficit present.     Mental Status:  He is alert and oriented to person, place, and time.  Psychiatric:        Mood and Affect: Mood normal.        Behavior: Behavior normal.        Thought Content: Thought content normal.        Judgment: Judgment normal.     Labs reviewed: Basic Metabolic Panel: Recent Labs    07/24/18 0821 11/25/18 0818 03/27/19 0807  NA 139 141 140  K 4.3 4.6 4.6  CL 106 106 105  CO2 27 27 27   GLUCOSE 119* 102* 115*  BUN 14 15 16   CREATININE 0.69* 0.79 0.85  CALCIUM 9.2 9.4 9.1   Liver Function Tests: Recent Labs    03/27/19 0807  AST 16  ALT 11  BILITOT 0.5  PROT 6.7   No results for input(s): LIPASE, AMYLASE in the last 8760 hours. No results for input(s): AMMONIA in the last 8760 hours. CBC: Recent Labs    03/27/19 0807  WBC 5.4  NEUTROABS 3,559  HGB 14.6  HCT 43.8  MCV 92.6  PLT 178   Lipid Panel: Recent Labs    07/24/18 0821 11/25/18 0818  CHOL 98 111  HDL 40* 35*  LDLCALC 44 53  TRIG 67 148  CHOLHDL 2.5 3.2   Lab Results  Component Value Date   HGBA1C 5.5 03/27/2019    Procedures since last visit: No results found.  Assessment/Plan 1. Environmental allergies - takes Clairtin daily, does not feel it is effective at times, he is not willing to switch to zyrtec at this time - loratadine (CLARITIN) 10 MG tablet; TAKE 1 TABLET BY MOUTH DAILY AS NEEDED FOR ALLERGIES  Dispense: 90 tablet;  Refill: 3  2. Insomnia due to other mental disorder - stable other than bedbug issue - recommend fumigating his house to get rid of bedbugs - traZODone (DESYREL) 100 MG tablet; Take 1 tablet (100 mg total) by mouth at bedtime. For sleep and depression  Dispense: 90 tablet; Refill: 3  3. Moderate episode of recurrent major depressive disorder (HCC) - stable, no progression - reminded to contact PCP if increased feelings of depression occur - traZODone (DESYREL) 100 MG tablet; Take 1 tablet (100 mg total) by mouth at bedtime. For sleep and depression  Dispense: 90 tablet; Refill: 3  4. Need for influenza vaccination -  administerFlu Vaccine QUAD High Dose(Fluad)  5. Hyperglycemia - stable  - hemogloubin A1C 5.5 - has lost over 10 pounds since last visit - recommend diet low in sugar and carbs, continue to portion size meals - encourage exercising/ light walking 3-5 times a week for at least 30 minutes - hemoglobin A1C- future - complete metabolic panel with GFR  6. Hypertriglyceridemia - stable with statin - continue current simvastatin dose - continue diet low in fats and fried foods - lipid panel- guture  7. Bedbug bite, initial encounter - bedbug bites along right side of body free of infection at this time - encourage fumigating bedroom and house if needed - recommend new mattress since this is the second episode - recommend washing bed linens at least once a week in hot water - recommend washing all clothing in hot water - continue to take clairtin daily - clobetasol proprinate 0.05% apply twice daily to affected area     Labs/tests ordered:  Complete metabolic panel with GFR, complete blood count with platelets, hemoglobin A1C, lipid panel Next appt:  4 months

## 2019-05-08 DIAGNOSIS — R634 Abnormal weight loss: Secondary | ICD-10-CM | POA: Diagnosis not present

## 2019-05-08 DIAGNOSIS — J309 Allergic rhinitis, unspecified: Secondary | ICD-10-CM | POA: Diagnosis not present

## 2019-05-08 DIAGNOSIS — H1045 Other chronic allergic conjunctivitis: Secondary | ICD-10-CM | POA: Diagnosis not present

## 2019-05-08 DIAGNOSIS — R21 Rash and other nonspecific skin eruption: Secondary | ICD-10-CM | POA: Diagnosis not present

## 2019-07-31 ENCOUNTER — Encounter: Payer: Self-pay | Admitting: Internal Medicine

## 2019-07-31 ENCOUNTER — Other Ambulatory Visit: Payer: Self-pay

## 2019-07-31 ENCOUNTER — Ambulatory Visit (INDEPENDENT_AMBULATORY_CARE_PROVIDER_SITE_OTHER): Payer: Medicare Other | Admitting: Internal Medicine

## 2019-07-31 VITALS — BP 138/78 | HR 76 | Temp 97.7°F | Ht 70.0 in | Wt 201.0 lb

## 2019-07-31 DIAGNOSIS — I10 Essential (primary) hypertension: Secondary | ICD-10-CM | POA: Diagnosis not present

## 2019-07-31 DIAGNOSIS — R739 Hyperglycemia, unspecified: Secondary | ICD-10-CM | POA: Diagnosis not present

## 2019-07-31 DIAGNOSIS — Z9109 Other allergy status, other than to drugs and biological substances: Secondary | ICD-10-CM | POA: Diagnosis not present

## 2019-07-31 DIAGNOSIS — E781 Pure hyperglyceridemia: Secondary | ICD-10-CM | POA: Diagnosis not present

## 2019-07-31 DIAGNOSIS — W57XXXD Bitten or stung by nonvenomous insect and other nonvenomous arthropods, subsequent encounter: Secondary | ICD-10-CM | POA: Diagnosis not present

## 2019-07-31 DIAGNOSIS — F331 Major depressive disorder, recurrent, moderate: Secondary | ICD-10-CM

## 2019-07-31 NOTE — Patient Instructions (Addendum)
COVID-19 Vaccine Information can be found at: [email or web address] For questions related to vaccine distribution or appointments, please email [email or web address] or call [phone].   I'm going to reach out to a case manager in the health system to try to get you some sort of assistance to deal with the bed bugs.

## 2019-07-31 NOTE — Progress Notes (Signed)
Location:  [facility] clinic Provider:  Dafney Farler L. Mariea Clonts, D.O., C.M.D.  Goals of Care:  Advanced Directives 07/31/2019  Does Patient Have a Medical Advance Directive? No  Would patient like information on creating a medical advance directive? No - Patient declined   Chief Complaint  Patient presents with  . Medication Management    4 month follow up for medicine mangaement     HPI: Patient is a 74 y.o. male seen today for medical management of chronic diseases.    He asks about the covid vaccine.  Bed bugs are still a problem at his house.  He still has a rash and itching all over.  The cost is prohibitive--$1700 to address it and he needs to get rid of furniture.  He's also fallen short with mortgage payments and has bad credit.  Has [employer], tricare and medicare.  They've now gotten into his wife's bedroom, as well.  He's not sleeping b/c he's itching.  He falls asleep 20 mins after taking his trazodone, then wakes up 2 hrs later.  Does rub  anti-itch cream on himself.  Clobetasol didn't help--not for that.  Used something from derm that did no good.    Uses the gold bond powder on his feet.    Spirits are not the best either with all of this going on.  He's not been exercising lately--needs tennis shoes.  Past Medical History:  Diagnosis Date  . Allergic rhinitis due to pollen   . Anxiety state, unspecified   . Asymptomatic varicose veins   . Atherosclerosis of native arteries of the extremities, unspecified   . Cataract 10/17/2016   L Eye  . Contact dermatitis and other eczema due to other chemical products   . 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 Flatulence, eructation, and gas pain   . Headache(784.0)   . Hemorrhoids, external   . Herpes zoster with unspecified complication   . Hyperpotassemia   . Insomnia, unspecified   . Lumbago   . Lumbago   . Myalgia and myositis, unspecified   . Nocturia   . Obesity, unspecified   . Osteoarthrosis,  unspecified whether generalized or localized, unspecified site   . Other abnormal blood chemistry   . Other headache syndromes(339.89)   . Pure hyperglyceridemia   . Rash and other nonspecific skin eruption   . Rotator cuff (capsule) sprain   . Routine general medical examination at a health care facility   . Tobacco use disorder   . Unspecified essential hypertension   . Unspecified hypertrophic and atrophic condition of skin   . Unspecified sleep apnea     Past Surgical History:  Procedure Laterality Date  . HEMORRHOID SURGERY    . VARICOSE VEIN SURGERY      Allergies  Allergen Reactions  . Celebrex [Celecoxib]   . Erythromycin   . Oxycontin [Oxycodone Hcl]   . Penicillins   . Tetracyclines & Related     Outpatient Encounter Medications as of 07/31/2019  Medication Sig  . loratadine (CLARITIN) 10 MG tablet TAKE 1 TABLET BY MOUTH DAILY AS NEEDED FOR ALLERGIES  . losartan (COZAAR) 50 MG tablet TAKE 1 TABLET(50 MG) BY MOUTH DAILY FOR BLOOD PRESSURE  . simvastatin (ZOCOR) 20 MG tablet Take 1 tablet (20 mg total) by mouth daily at 6 PM.  . traZODone (DESYREL) 100 MG tablet Take 1 tablet (100 mg total) by mouth at bedtime. For sleep and depression  . [DISCONTINUED] clobetasol ointment (TEMOVATE) 0.05 %  Apply 1 application topically 2 (two) times daily.   No facility-administered encounter medications on file as of 07/31/2019.    Review of Systems:  Review of Systems  Constitutional: Positive for malaise/fatigue. Negative for chills and fever.  HENT: Negative for congestion, hearing loss and sore throat.   Eyes: Negative for blurred vision.  Respiratory: Negative for cough and shortness of breath.   Cardiovascular: Negative for chest pain, palpitations and leg swelling.  Gastrointestinal: Negative for abdominal pain, blood in stool, constipation, diarrhea and melena.  Genitourinary: Negative for dysuria.  Musculoskeletal: Negative for falls and joint pain.  Skin: Positive  for itching and rash.       Bed bugs persist  Neurological: Negative for dizziness, loss of consciousness and weakness.  Psychiatric/Behavioral: Positive for depression. Negative for memory loss. The patient is nervous/anxious and has insomnia.     Health Maintenance  Topic Date Due  . COLONOSCOPY  08/31/2020  . TETANUS/TDAP  06/14/2023  . INFLUENZA VACCINE  Completed  . Hepatitis C Screening  Completed  . PNA vac Low Risk Adult  Completed    Physical Exam: Vitals:   07/31/19 0807  BP: 138/78  Pulse: 76  Temp: 97.7 F (36.5 C)  TempSrc: Temporal  SpO2: 98%  Weight: 201 lb (91.2 kg)  Height: 5\' 10"  (1.778 m)   Body mass index is 28.84 kg/m. Physical Exam Vitals reviewed.  Constitutional:      Appearance: Normal appearance.     Comments: Itching on scalp with bands of inflammation, also has lineal rash on arms, legs, evidence of pruritus  HENT:     Head: Normocephalic and atraumatic.  Cardiovascular:     Rate and Rhythm: Normal rate and regular rhythm.     Pulses: Normal pulses.     Heart sounds: Normal heart sounds.  Pulmonary:     Effort: Pulmonary effort is normal.     Breath sounds: Normal breath sounds. No wheezing, rhonchi or rales.  Abdominal:     General: Bowel sounds are normal.  Musculoskeletal:        General: Normal range of motion.     Right lower leg: No edema.     Left lower leg: No edema.  Skin:    General: Skin is warm and dry.     Capillary Refill: Capillary refill takes less than 2 seconds.     Findings: Rash present.     Comments: See above  Neurological:     General: No focal deficit present.     Mental Status: He is alert and oriented to person, place, and time.  Psychiatric:     Comments: Anxious and down     Labs reviewed: Basic Metabolic Panel: Recent Labs    11/25/18 0818 03/27/19 0807  NA 141 140  K 4.6 4.6  CL 106 105  CO2 27 27  GLUCOSE 102* 115*  BUN 15 16  CREATININE 0.79 0.85  CALCIUM 9.4 9.1   Liver Function  Tests: Recent Labs    03/27/19 0807  AST 16  ALT 11  BILITOT 0.5  PROT 6.7   No results for input(s): LIPASE, AMYLASE in the last 8760 hours. No results for input(s): AMMONIA in the last 8760 hours. CBC: Recent Labs    03/27/19 0807  WBC 5.4  NEUTROABS 3,559  HGB 14.6  HCT 43.8  MCV 92.6  PLT 178   Lipid Panel: Recent Labs    11/25/18 0818  CHOL 111  HDL 35*  LDLCALC 53  TRIG  148  CHOLHDL 3.2   Lab Results  Component Value Date   HGBA1C 5.5 03/27/2019    Assessment/Plan 1. Environmental allergies -remains bothersome to him, has tried several different allergy pills and has been to allergy and immunology several times, continue claritin   2. Hyperglycemia -likely went back up due to no longer walking and going to the gym - Basic metabolic panel - Hemoglobin A1c  3. Hypertriglyceridemia -had been improving, but he's quit exercising - CBC with Differential/Platelet - Basic metabolic panel - Lipid panel  4. Bedbug bite, subsequent encounter -ongoing with rash of extremities especially, he sees the bugs and cannot afford to get this fixed b/w the actual extermination process and then getting rid of the mattress, boxspring and couch -will see if [facility] can help at all--he likes to say that he does not want to use the [facility] insurance except for the gym membership, but, of course, that does not affect it being his insurance plan   5. Moderate episode of recurrent major depressive disorder (HCC) -continue nightly trazodone, but not helping much due to itching from bed bugs which is his biggest problem at the moment and needs to be addressed  6. Essential hypertension -bp well controlled at current time; unfortunately not exercising which I encouraged him to get back to, but he also cannot afford new sneakers when he's missed mortgage payments and can't even pay for bed bug extermination - Basic metabolic panel  Labs/tests ordered:   Lab Orders     CBC with  Differential/Platelet     Basic metabolic panel     Lipid panel     Hemoglobin A1c  Next appt:  12/01/2019 med mgt, no labs before  Nihira Puello L. Arwilda Georgia, D.O. [facility] [address], [place] [address] Cell Phone (Mon-Fri 8am-5pm):  [phone] On Call:  [phone] & follow prompts after 5pm & weekends Office Phone:  [phone] Office Fax:  [phone]

## 2019-08-01 LAB — CBC WITH DIFFERENTIAL/PLATELET
Absolute Monocytes: 551 cells/uL (ref 200–950)
Basophils Absolute: 58 cells/uL (ref 0–200)
Basophils Relative: 1 %
Eosinophils Absolute: 180 cells/uL (ref 15–500)
Eosinophils Relative: 3.1 %
HCT: 44.7 % (ref 38.5–50.0)
Hemoglobin: 15 g/dL (ref 13.2–17.1)
Lymphs Abs: 1276 cells/uL (ref 850–3900)
MCH: 31.1 pg (ref 27.0–33.0)
MCHC: 33.6 g/dL (ref 32.0–36.0)
MCV: 92.5 fL (ref 80.0–100.0)
MPV: 11.6 fL (ref 7.5–12.5)
Monocytes Relative: 9.5 %
Neutro Abs: 3735 cells/uL (ref 1500–7800)
Neutrophils Relative %: 64.4 %
Platelets: 205 10*3/uL (ref 140–400)
RBC: 4.83 10*6/uL (ref 4.20–5.80)
RDW: 12.2 % (ref 11.0–15.0)
Total Lymphocyte: 22 %
WBC: 5.8 10*3/uL (ref 3.8–10.8)

## 2019-08-01 LAB — LIPID PANEL
Cholesterol: 108 mg/dL (ref <200)
HDL: 42 mg/dL (ref >=40)
LDL Cholesterol (Calc): 45 mg/dL (calc)
Non-HDL Cholesterol (Calc): 66 mg/dL (calc) (ref <130)
Total CHOL/HDL Ratio: 2.6 (calc) (ref <5.0)
Triglycerides: 127 mg/dL (ref <150)

## 2019-08-01 LAB — BASIC METABOLIC PANEL
BUN: 17 mg/dL (ref 7–25)
CO2: 25 mmol/L (ref 20–32)
Calcium: 9.3 mg/dL (ref 8.6–10.3)
Chloride: 106 mmol/L (ref 98–110)
Creat: 0.72 mg/dL (ref 0.70–1.18)
Glucose, Bld: 116 mg/dL — ABNORMAL HIGH (ref 65–99)
Potassium: 4.1 mmol/L (ref 3.5–5.3)
Sodium: 139 mmol/L (ref 135–146)

## 2019-08-01 LAB — HEMOGLOBIN A1C
Hgb A1c MFr Bld: 5.7 % of total Hgb — ABNORMAL HIGH (ref <5.7)
Mean Plasma Glucose: 117 (calc)
eAG (mmol/L): 6.5 (calc)

## 2019-08-01 NOTE — Progress Notes (Signed)
Labs are good EXCEPT his sugar is trending back up from not exercising.  Please call and tell him and also remind him that someone from [facility] may be calling him to get him help with his bed bug situation so he needs to answer their calls.

## 2019-08-04 ENCOUNTER — Other Ambulatory Visit: Payer: Self-pay | Admitting: *Deleted

## 2019-08-04 NOTE — Patient Outreach (Signed)
[facility] [facility]) Care Management  08/04/2019  Jamarion Rieker [birth year] [id number]   Referral Date: 1/21 Referral Source: MD office Referral Reason:  social and financial concerns--has bed bugs he cannot afford to extreminate and furniture he needs to replace   Insurance: [place] attempt #1, successful.  Social: Lives with wife, report being independent in management of care and ADL's.  Currently dealing with bedbug infestation but state he has a pest control company coming to do an estimate on Wednesday.  Report this company is willing to have him put half the money down and then make a payment arrangement instead of paying the full price upfront.  State this will be affordable for him but he will still need resources to replace his furniture.    Conditions: Per chart, has history of HTN, hyperlipidemia, and hypertriglycerides.  Report all conditions are stable.  Medications: Reviewed, report taking as instructed.  Denies needing financial assistance.  Appointments: Last visit was 1/21 with PCP, next visit scheduled for May.  Denies needing transportation assistance.   Plan: RN CM will place referral to social worker and follow up within the next 2 weeks.  If remain stable and denies nursing needs, will close case to nursing at that time.  [name], [place], MSN [place] [phone]

## 2019-08-07 ENCOUNTER — Other Ambulatory Visit: Payer: Self-pay

## 2019-08-07 NOTE — Patient Outreach (Addendum)
[place] [facility]) Care Management  08/07/2019  David [name] 1945-11-25 [id number]     Social work referral received from [facility], [employer], on 08/05/19.   "Patient has bedbugs, will have estimate for treatment this week.  Thinks he will be able to afford the treatment but will need resources for affordable furniture to replace things that are damaged." Successful outreach to patient today.  Introduced self and reason for call.  Patient stated that they will need a new sofa and possibly new mattress and box spring.  Talked with patient about [name] as potential option and he consented to referral being submitted if he is eligible for assistance.   Informed patient that [place] and [employer] may also be options.  Offered to provide him with contact information so that he may inquire but he declined.   Left message for Erline Levine, Program Service Coordinator with [name] regarding patient's eligibility for assistance. Awaiting return call.  Will call again next week if no return call.  Will follow up with patient when response is received.     Addendum: Received return call from [facility] with [name].  Discussed need for furniture for patient after treatment of bed bugs.  Erline Levine stated that she will need to check their system to ensure patient has not received assistance from their agency within the last five years.  If he has not, patient is eligible for assistance.  [facility] will require a letter from [employer] stating that the infestation problem has been resolved.  Will contact patient to discuss this once hearing back from [place].       Ronn Melena, BSW Social Worker [phone]

## 2019-08-12 ENCOUNTER — Other Ambulatory Visit: Payer: Self-pay

## 2019-08-12 NOTE — Patient Outreach (Signed)
[place] [facility]) Care Management  08/12/2019  Slayter Data 1945/12/29 [id number]   Left message for Erline Levine with [place] regarding possible referral for patient.  Per conversation on 08/07/19, Erline Levine was going to verify that patient has not received assistance through their agency within the last five years and call me back.  If he has not, a referral can be submitted.  Have not received follow up call from her as of yet.    Ronn Melena, BSW Social Worker [phone]

## 2019-08-13 ENCOUNTER — Other Ambulatory Visit: Payer: Self-pay

## 2019-08-13 NOTE — Patient Outreach (Signed)
[place] [facility]) Care Management  08/13/2019  Lucio Wyke. 1945-12-03 [id number]   Received response from Erline Levine with [place] regarding inquiry about referring patient to program for furniture replacement after bed bug extermination.  Patient is eligible for assistance.  They will require a letter from the exterminator indicating this issue has been resolved.  Attempted to contact patient today to discuss this but he was not at home during the time of the call. Will attempt to reach him again tomorrow.  Ronn Melena, BSW Social Worker [phone]

## 2019-08-14 ENCOUNTER — Other Ambulatory Visit: Payer: Self-pay

## 2019-08-14 NOTE — Patient Outreach (Signed)
David [name]) Care Management  08/14/2019  David [name]. [birth year] [id number]   Successful follow up call to patient today.  Informed him that [name] can assist with replacement of furniture if a letter from the exterminator can be provided.  Patient stated that home has been exterminated and he does not think that replacing furniture is necessary at this point.  Social Work case is being closed but did encourage patient to call if he changes his mind about assistance.  Ronn Melena, BSW Social Worker [phone]

## 2019-08-18 ENCOUNTER — Other Ambulatory Visit: Payer: Self-pay | Admitting: *Deleted

## 2019-08-18 NOTE — Patient Outreach (Signed)
[place] [facility]) Care Management  08/18/2019  David [name]. 03-21-1946 [id number]   Call placed to member to follow up on needs and communication with social worker.  State he has been in contact with BSW, treatment for bed bugs has been completed.  Report he has been sleeping better since the treatment, denies any further needs at this time.  Receptive to receiving this care manager's contact information in case needs change in the future.  Will close case at this time.  [name], [place], MSN [place] [phone]

## 2019-09-05 ENCOUNTER — Ambulatory Visit: Payer: TRICARE For Life (TFL)

## 2019-09-07 ENCOUNTER — Ambulatory Visit: Payer: Medicare PPO | Attending: Internal Medicine

## 2019-09-07 DIAGNOSIS — Z23 Encounter for immunization: Secondary | ICD-10-CM | POA: Insufficient documentation

## 2019-09-07 NOTE — Progress Notes (Signed)
   [id number] Vaccination Clinic  Name:  David [name].    MRN: [id number] DOB: Nov 10, 1945  09/07/2019  [name] was observed post Covid-19 immunization for 15 minutes without incidence. He was provided with Vaccine Information Sheet and instruction to access the V-Safe system.   [name] was instructed to call 911 with any severe reactions post vaccine: [place] Kitchen Difficulty breathing  . Swelling of your face and throat  . A fast heartbeat  . A bad rash all over your body  . Dizziness and weakness    Immunizations Administered    Name Date Dose VIS Date Route   Pfizer COVID-19 Vaccine 09/07/2019 11:53 AM 0.3 mL 06/20/2019 Intramuscular   Manufacturer: [place]   Lot: [id number]   [place]: [id number]

## 2019-10-01 NOTE — Telephone Encounter (Signed)
Opened in error

## 2019-10-07 ENCOUNTER — Ambulatory Visit: Payer: Medicare PPO | Attending: Internal Medicine

## 2019-10-07 DIAGNOSIS — Z23 Encounter for immunization: Secondary | ICD-10-CM

## 2019-10-07 NOTE — Progress Notes (Signed)
   [id number] Vaccination Clinic  Name:  David [name].    MRN: [id number] DOB: 06/06/1946  10/07/2019  [name] was observed post Covid-19 immunization for 15 minutes without incident. He was provided with Vaccine Information Sheet and instruction to access the V-Safe system.   [name] was instructed to call 911 with any severe reactions post vaccine: Marland Kitchen Difficulty breathing  . Swelling of face and throat  . A fast heartbeat  . A bad rash all over body  . Dizziness and weakness   Immunizations Administered    Name Date Dose VIS Date Route   Pfizer COVID-19 Vaccine 10/07/2019 10:10 AM 0.3 mL 06/20/2019 Intramuscular   Manufacturer: [employer]   Lot: [id number]   [place]: [id number]

## 2019-10-27 ENCOUNTER — Other Ambulatory Visit: Payer: Self-pay | Admitting: *Deleted

## 2019-10-27 DIAGNOSIS — Z9109 Other allergy status, other than to drugs and biological substances: Secondary | ICD-10-CM

## 2019-10-27 MED ORDER — LORATADINE 10 MG PO TABS: ORAL_TABLET | ORAL | 3 refills | Status: DC

## 2019-12-01 ENCOUNTER — Other Ambulatory Visit: Payer: Self-pay

## 2019-12-01 ENCOUNTER — Encounter: Payer: Self-pay | Admitting: Internal Medicine

## 2019-12-01 ENCOUNTER — Ambulatory Visit (INDEPENDENT_AMBULATORY_CARE_PROVIDER_SITE_OTHER): Payer: Medicare PPO | Admitting: Internal Medicine

## 2019-12-01 VITALS — BP 122/82 | HR 66 | Temp 97.3°F | Ht 70.0 in | Wt 199.4 lb

## 2019-12-01 DIAGNOSIS — I10 Essential (primary) hypertension: Secondary | ICD-10-CM

## 2019-12-01 DIAGNOSIS — Z9109 Other allergy status, other than to drugs and biological substances: Secondary | ICD-10-CM

## 2019-12-01 DIAGNOSIS — E781 Pure hyperglyceridemia: Secondary | ICD-10-CM

## 2019-12-01 DIAGNOSIS — R739 Hyperglycemia, unspecified: Secondary | ICD-10-CM

## 2019-12-01 DIAGNOSIS — F99 Mental disorder, not otherwise specified: Secondary | ICD-10-CM

## 2019-12-01 DIAGNOSIS — M5432 Sciatica, left side: Secondary | ICD-10-CM

## 2019-12-01 DIAGNOSIS — F5105 Insomnia due to other mental disorder: Secondary | ICD-10-CM

## 2019-12-01 NOTE — Patient Instructions (Addendum)
Sciatica  Sciatica is pain, weakness, tingling, or loss of feeling (numbness) along the sciatic nerve. The sciatic nerve starts in the lower back and goes down the back of each leg. Sciatica usually goes away on its own or with treatment. Sometimes, sciatica may come back (recur). What are the causes? This condition happens when the sciatic nerve is pinched or has pressure put on it. This may be the result of:  A disk in between the bones of the spine bulging out too far (herniated disk).  Changes in the spinal disks that occur with aging.  A condition that affects a muscle in the butt.  Extra bone growth near the sciatic nerve.  A break (fracture) of the area between your hip bones (pelvis).  Pregnancy.  Tumor. This is rare. What increases the risk? You are more likely to develop this condition if you:  Play sports that put pressure or stress on the spine.  Have poor strength and ease of movement (flexibility).  Have had a back injury in the past.  Have had back surgery.  Sit for long periods of time.  Do activities that involve bending or lifting over and over again.  Are very overweight (obese). What are the signs or symptoms? Symptoms can vary from mild to very bad. They may include:  Any of these problems in the lower back, leg, hip, or butt: ? Mild tingling, loss of feeling, or dull aches. ? Burning sensations. ? Sharp pains.  Loss of feeling in the back of the calf or the sole of the foot.  Leg weakness.  Very bad back pain that makes it hard to move. These symptoms may get worse when you cough, sneeze, or laugh. They may also get worse when you sit or stand for long periods of time. How is this treated? This condition often gets better without any treatment. However, treatment may include:  Changing or cutting back on physical activity when you have pain.  Doing exercises and stretching.  Putting ice or heat on the affected area.  Medicines that  help: ? To relieve pain and swelling. ? To relax your muscles.  Shots (injections) of medicines that help to relieve pain, irritation, and swelling.  Surgery. Follow these instructions at home: Medicines  Take over-the-counter and prescription medicines only as told by your doctor.  Ask your doctor if the medicine prescribed to you: ? Requires you to avoid driving or using heavy machinery. ? Can cause trouble pooping (constipation). You may need to take these steps to prevent or treat trouble pooping:  Drink enough fluids to keep your pee (urine) pale yellow.  Take over-the-counter or prescription medicines.  Eat foods that are high in fiber. These include beans, whole grains, and fresh fruits and vegetables.  Limit foods that are high in fat and sugar. These include fried or sweet foods. Managing pain      If told, put ice on the affected area. ? Put ice in a plastic bag. ? Place a towel between your skin and the bag. ? Leave the ice on for 20 minutes, 2-3 times a day.  If told, put heat on the affected area. Use the heat source that your doctor tells you to use, such as a moist heat pack or a heating pad. ? Place a towel between your skin and the heat source. ? Leave the heat on for 20-30 minutes. ? Remove the heat if your skin turns bright red. This is very important if you are   unable to feel pain, heat, or cold. You may have a greater risk of getting burned. Activity   Return to your normal activities as told by your doctor. Ask your doctor what activities are safe for you.  Avoid activities that make your symptoms worse.  Take short rests during the day. ? When you rest for a long time, do some physical activity or stretching between periods of rest. ? Avoid sitting for a long time without moving. Get up and move around at least one time each hour.  Exercise and stretch regularly, as told by your doctor.  Do not lift anything that is heavier than 10 lb (4.5 kg)  while you have symptoms of sciatica. ? Avoid lifting heavy things even when you do not have symptoms. ? Avoid lifting heavy things over and over.  When you lift objects, always lift in a way that is safe for your body. To do this, you should: ? Bend your knees. ? Keep the object close to your body. ? Avoid twisting. General instructions  Stay at a healthy weight.  Wear comfortable shoes that support your feet. Avoid wearing high heels.  Avoid sleeping on a mattress that is too soft or too hard. You might have less pain if you sleep on a mattress that is firm enough to support your back.  Keep all follow-up visits as told by your doctor. This is important. Contact a doctor if:  You have pain that: ? Wakes you up when you are sleeping. ? Gets worse when you lie down. ? Is worse than the pain you have had in the past. ? Lasts longer than 4 weeks.  You lose weight without trying. Get help right away if:  You cannot control when you pee (urinate) or poop (have a bowel movement).  You have weakness in any of these areas and it gets worse: ? Lower back. ? The area between your hip bones. ? Butt. ? Legs.  You have redness or swelling of your back.  You have a burning feeling when you pee. Summary Sciatica is pain, weakness, tingling, or loss of feeling (numbness) along the sciatic nerve.                                                                   Back Exercises These exercises help to make your trunk and back strong. They also help to keep the lower back flexible. Doing these exercises can help to prevent back pain or lessen existing pain. If you have back pain, try to do these exercises 2-3 times each day or as told by your doctor. As you get better, do the exercises once each day. Repeat the exercises more often as told by your doctor. To stop back pain from coming back, do the exercises once each day, or as told by your doctor. Exercises Single knee to chest Do these  steps 3-5 times in a row for each leg: Lie on your back on a firm bed or the floor with your legs stretched out. Bring one knee to your chest. Grab your knee or thigh with both hands and hold them it in place. Pull on your knee until you feel a gentle stretch in your lower back or buttocks. Keep doing the  stretch for 10-30 seconds. Slowly let go of your leg and straighten it. Pelvic tilt Do these steps 5-10 times in a row: Lie on your back on a firm bed or the floor with your legs stretched out. Bend your knees so they point up to the ceiling. Your feet should be flat on the floor. Tighten your lower belly (abdomen) muscles to press your lower back against the floor. This will make your tailbone point up to the ceiling instead of pointing down to your feet or the floor. Stay in this position for 5-10 seconds while you gently tighten your muscles and breathe evenly. Cat-cow Do these steps until your lower back bends more easily: Get on your hands and knees on a firm surface. Keep your hands under your shoulders, and keep your knees under your hips. You may put padding under your knees. Let your head hang down toward your chest. Tighten (contract) the muscles in your belly. Point your tailbone toward the floor so your lower back becomes rounded like the back of a cat. Stay in this position for 5 seconds. Slowly lift your head. Let the muscles of your belly relax. Point your tailbone up toward the ceiling so your back forms a sagging arch like the back of a cow. Stay in this position for 5 seconds.  Press-ups Do these steps 5-10 times in a row: Lie on your belly (face-down) on the floor. Place your hands near your head, about shoulder-width apart. While you keep your back relaxed and keep your hips on the floor, slowly straighten your arms to raise the top half of your body and lift your shoulders. Do not use your back muscles. You may change where you place your hands in order to make yourself  more comfortable. Stay in this position for 5 seconds. Slowly return to lying flat on the floor.  Bridges Do these steps 10 times in a row: Lie on your back on a firm surface. Bend your knees so they point up to the ceiling. Your feet should be flat on the floor. Your arms should be flat at your sides, next to your body. Tighten your butt muscles and lift your butt off the floor until your waist is almost as high as your knees. If you do not feel the muscles working in your butt and the back of your thighs, slide your feet 1-2 inches farther away from your butt. Stay in this position for 3-5 seconds. Slowly lower your butt to the floor, and let your butt muscles relax. If this exercise is too easy, try doing it with your arms crossed over your chest. Belly crunches Do these steps 5-10 times in a row: Lie on your back on a firm bed or the floor with your legs stretched out. Bend your knees so they point up to the ceiling. Your feet should be flat on the floor. Cross your arms over your chest. Tip your chin a little bit toward your chest but do not bend your neck. Tighten your belly muscles and slowly raise your chest just enough to lift your shoulder blades a tiny bit off of the floor. Avoid raising your body higher than that, because it can put too much stress on your low back. Slowly lower your chest and your head to the floor. Back lifts Do these steps 5-10 times in a row: Lie on your belly (face-down) with your arms at your sides, and rest your forehead on the floor. Tighten the muscles in your  legs and your butt. Slowly lift your chest off of the floor while you keep your hips on the floor. Keep the back of your head in line with the curve in your back. Look at the floor while you do this. Stay in this position for 3-5 seconds. Slowly lower your chest and your face to the floor. Contact a doctor if: Your back pain gets a lot worse when you do an exercise. Your back pain does not get  better 2 hours after you exercise. If you have any of these problems, stop doing the exercises. Do not do them again unless your doctor says it is okay. Get help right away if: You have sudden, very bad back pain. If this happens, stop doing the exercises. Do not do them again unless your doctor says it is okay. This information is not intended to replace advice given to you by your health care provider. Make sure you discuss any questions you have with your health care provider. Document Revised: 03/21/2018 Document Reviewed: 03/21/2018 Elsevier Patient Education  [employer].    This condition happens when the sciatic nerve is pinched or has pressure put on it.  Sciatica can cause pain, tingling, or loss of feeling (numbness) in the lower back, legs, hips, and butt.  Treatment often includes rest, exercise, medicines, and putting ice or heat on the affected area. This information is not intended to replace advice given to you by your health care provider. Make sure you discuss any questions you have with your health care provider. Document Revised: 07/15/2018 Document Reviewed: 07/15/2018 Elsevier Patient Education  [place].

## 2019-12-01 NOTE — Progress Notes (Signed)
Location:  [facility] clinic Provider:  Victorya Hillman L. Mariea Clonts, D.O., C.M.D.  Goals of Care:  Advanced Directives 12/01/2019  Does Patient Have a Medical Advance Directive? No  Would patient like information on creating a medical advance directive? No - Patient declined   Chief Complaint  Patient presents with  . Medical Management of Chronic Issues    4 month follow up    HPI: Patient is a 74 y.o. male seen today for medical management of chronic diseases.    He's having discomfort in his left buttock--gets a catch once in a while--hurts a while, then stops.  Going on about a month.  It's a light throb.  He has not done anything for it.  Says it could be b/c he does not exercise or stretch before mowing.  He has been mowing more in the past couple of months with weather warming up.  Pain burns down left leg into foot at times, but not consistent.  His dry eyes are acting up.  He had his allergy testing and nothing shows up.  He is taking his claritin for allergies.    He's not exercising.  He is not walking these days.  He's been in a blah phase.  He is worried about his wife and her worsening memory.  Her short-term is really bad now--she'll ask two mins after he told her which day it is.  He's not with her all hours of the day and she sometimes forgets her pills.  She still drives and goes out with her [place] friends.  Bed bug situation is taken care of now.  They did heat treatment.  Ever since then, no more itching and scratching and killing bugs.  He swept up all the dead ones the next morning.    Past Medical History:  Diagnosis Date  . Allergic rhinitis due to pollen   . Anxiety state, unspecified   . Asymptomatic varicose veins   . Atherosclerosis of native arteries of the extremities, unspecified   . Cataract 10/17/2016   L Eye  . Contact dermatitis and other eczema due to other chemical products   . 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 Flatulence,  eructation, and gas pain   . Headache(784.0)   . Hemorrhoids, external   . Herpes zoster with unspecified complication   . Hyperpotassemia   . Insomnia, unspecified   . Lumbago   . Lumbago   . Myalgia and myositis, unspecified   . Nocturia   . Obesity, unspecified   . Osteoarthrosis, unspecified whether generalized or localized, unspecified site   . Other abnormal blood chemistry   . Other headache syndromes(339.89)   . Pure hyperglyceridemia   . Rash and other nonspecific skin eruption   . Rotator cuff (capsule) sprain   . Routine general medical examination at a health care facility   . Tobacco use disorder   . Unspecified essential hypertension   . Unspecified hypertrophic and atrophic condition of skin   . Unspecified sleep apnea     Past Surgical History:  Procedure Laterality Date  . HEMORRHOID SURGERY    . VARICOSE VEIN SURGERY      Allergies  Allergen Reactions  . Celebrex [Celecoxib]   . Erythromycin   . Oxycontin [Oxycodone Hcl]   . Penicillins   . Tetracyclines & Related     Outpatient Encounter Medications as of 12/01/2019  Medication Sig  . loratadine (CLARITIN) 10 MG tablet TAKE 1 TABLET BY MOUTH DAILY  AS NEEDED FOR ALLERGIES  . losartan (COZAAR) 50 MG tablet TAKE 1 TABLET(50 MG) BY MOUTH DAILY FOR BLOOD PRESSURE  . simvastatin (ZOCOR) 20 MG tablet Take 1 tablet (20 mg total) by mouth daily at 6 PM.  . traZODone (DESYREL) 100 MG tablet Take 1 tablet (100 mg total) by mouth at bedtime. For sleep and depression   No facility-administered encounter medications on file as of 12/01/2019.    Review of Systems:  Review of Systems  Constitutional: Negative for chills, fever and malaise/fatigue.  HENT: Positive for hearing loss. Negative for congestion.        No cerumen  Eyes: Negative for blurred vision.       Dry eyes; watery, red, itchy  Respiratory: Negative for cough and shortness of breath.   Cardiovascular: Negative for chest pain, palpitations and  leg swelling.  Gastrointestinal: Negative for abdominal pain, blood in stool, constipation and melena.  Genitourinary: Negative for dysuria.  Musculoskeletal: Positive for back pain. Negative for falls and joint pain.  Skin: Negative for itching (resolved :)) and rash.  Neurological: Positive for tingling and sensory change. Negative for dizziness and loss of consciousness.  Endo/Heme/Allergies: Bruises/bleeds easily.  Psychiatric/Behavioral: Negative for depression. The patient is not nervous/anxious and does not have insomnia.     Health Maintenance  Topic Date Due  . INFLUENZA VACCINE  02/08/2020  . COLONOSCOPY  08/31/2020  . TETANUS/TDAP  06/14/2023  . COVID-19 Vaccine  Completed  . Hepatitis C Screening  Completed  . PNA vac Low Risk Adult  Completed    Physical Exam: Vitals:   12/01/19 0832  BP: 122/82  Pulse: 66  Temp: (!) 97.3 F (36.3 C)  TempSrc: Temporal  SpO2: 97%  Weight: 199 lb 6.4 oz (90.4 kg)  Height: 5\' 10"  (1.778 m)   Body mass index is 28.61 kg/m. Physical Exam Vitals reviewed.  Constitutional:      General: He is not in acute distress.    Appearance: Normal appearance. He is not toxic-appearing.  HENT:     Head: Normocephalic and atraumatic.     Right Ear: Tympanic membrane, ear canal and external ear normal. There is no impacted cerumen.     Left Ear: Tympanic membrane, ear canal and external ear normal. There is no impacted cerumen.  Eyes:     General:        Right eye: Discharge present.        Left eye: Discharge present.    Comments: Clear discharge, some erythema of conjunctiva bilaterally  Cardiovascular:     Rate and Rhythm: Normal rate and regular rhythm.     Pulses: Normal pulses.     Heart sounds: Normal heart sounds.  Pulmonary:     Effort: Pulmonary effort is normal.     Breath sounds: Normal breath sounds. No wheezing, rhonchi or rales.  Abdominal:     General: Bowel sounds are normal.     Palpations: Abdomen is soft.      Tenderness: There is no abdominal tenderness.  Musculoskeletal:        General: Tenderness present. Normal range of motion.     Right lower leg: No edema.     Left lower leg: No edema.     Comments: Tender over left SI region on deep palpation, no tenderness of lower lumbar region   Skin:    General: Skin is warm and dry.  Neurological:     General: No focal deficit present.     Mental Status:  He is alert and oriented to person, place, and time.     Gait: Gait normal.  Psychiatric:        Mood and Affect: Mood normal.        Behavior: Behavior normal.     Labs reviewed: Basic Metabolic Panel: Recent Labs    03/27/19 0807 07/31/19 0839  NA 140 139  K 4.6 4.1  CL 105 106  CO2 27 25  GLUCOSE 115* 116*  BUN 16 17  CREATININE 0.85 0.72  CALCIUM 9.1 9.3   Liver Function Tests: Recent Labs    03/27/19 0807  AST 16  ALT 11  BILITOT 0.5  PROT 6.7   No results for input(s): LIPASE, AMYLASE in the last 8760 hours. No results for input(s): AMMONIA in the last 8760 hours. CBC: Recent Labs    03/27/19 0807 07/31/19 0839  WBC 5.4 5.8  NEUTROABS 3,559 3,735  HGB 14.6 15.0  HCT 43.8 44.7  MCV 92.6 92.5  PLT 178 205   Lipid Panel: Recent Labs    07/31/19 0839  CHOL 108  HDL 42  LDLCALC 45  TRIG 127  CHOLHDL 2.6   Lab Results  Component Value Date   HGBA1C 5.7 (H) 07/31/2019    Assessment/Plan 1. Left sided sciatica -given low back stretches, recommended stretching before mowing the lawn, use of salonpas with lidocaine over left buttock area if painful afterwards, also may separately use heat and tylenol  2. Environmental allergies -cont claritin therapy  3. Hyperglycemia - f/u labs due to not walking lately like he had been, diet remains improved though by his report - Hemoglobin [id number] - Basic metabolic panel  4. Hypertriglyceridemia -f/u lab next time at visit, cont zocor and improved diet, get back on track with walking  5. Essential hypertension  -bp is well controlled with just losartan with added renal protective benefit - Basic metabolic panel  6. Insomnia due to other mental disorder -cont trazodone therapy at hs--has been effective  Bed bugs resolved.  Appreciate [facility] team assistance.  Labs/tests ordered:   Lab Orders     Hemoglobin A1c     Basic metabolic panel     CBC with Differential/Platelet     COMPLETE METABOLIC PANEL WITH GFR     Hemoglobin A1c     Lipid panel  Next appt:  4 mos with fasting labs same day.   Troi Florendo L. Lataysha Vohra, D.O. [facility] [address], [place] [address] Cell Phone (Mon-Fri 8am-5pm):  [phone] On Call:  [phone] & follow prompts after 5pm & weekends Office Phone:  [phone] Office Fax:  [phone]

## 2019-12-02 LAB — HEMOGLOBIN A1C
Hgb A1c MFr Bld: 5.3 % of total Hgb (ref <5.7)
Mean Plasma Glucose: 105 (calc)
eAG (mmol/L): 5.8 (calc)

## 2019-12-02 LAB — BASIC METABOLIC PANEL
BUN: 16 mg/dL (ref 7–25)
CO2: 24 mmol/L (ref 20–32)
Calcium: 9.5 mg/dL (ref 8.6–10.3)
Chloride: 107 mmol/L (ref 98–110)
Creat: 0.71 mg/dL (ref 0.70–1.18)
Glucose, Bld: 108 mg/dL — ABNORMAL HIGH (ref 65–99)
Potassium: 4.1 mmol/L (ref 3.5–5.3)
Sodium: 139 mmol/L (ref 135–146)

## 2019-12-02 NOTE — Progress Notes (Signed)
Sugar average has improved down into normal range which is great. Electrolytes and kidneys are ok.

## 2019-12-23 ENCOUNTER — Encounter: Payer: Medicare Other | Admitting: Family

## 2019-12-30 ENCOUNTER — Ambulatory Visit (INDEPENDENT_AMBULATORY_CARE_PROVIDER_SITE_OTHER): Payer: Medicare PPO | Admitting: Family

## 2019-12-30 ENCOUNTER — Encounter: Payer: Self-pay | Admitting: Family

## 2019-12-30 ENCOUNTER — Other Ambulatory Visit: Payer: Self-pay

## 2019-12-30 DIAGNOSIS — Z0000 Encounter for general adult medical examination without abnormal findings: Secondary | ICD-10-CM

## 2019-12-30 NOTE — Progress Notes (Signed)
    This service is provided via telemedicine  No vital signs collected/recorded due to the encounter was a telemedicine visit.   Location of patient (ex: home, work): Home.  Patient consents to a telephone visit: Yes.  Location of the provider (ex: office, home):  [facility].  Name of any referring provider: N/A  Names of all persons participating in the telemedicine service and their role in the encounter:  Patient, Christo Hain, RMA, Ngetich, Dinah, NP.    Time spent on call: 8 minutes spent on the phone with Medical Assistant.   

## 2019-12-30 NOTE — Progress Notes (Signed)
Subjective:   David [name]. is a 74 y.o. male who presents for Medicare Annual/Subsequent preventive examination.  Review of Systems     Cardiac Risk Factors include: advanced age (>59men, >67 women);hypertension;male gender;smoking/ tobacco exposure;sedentary lifestyle     Objective:    There were no vitals filed for this visit. There is no height or weight on file to calculate BMI.  Advanced Directives 12/30/2019 12/01/2019 07/31/2019 12/24/2018 05/23/2018 12/20/2017 11/12/2017  Does Patient Have a Medical Advance Directive? No No No No No No No  Would patient like information on creating a medical advance directive? No - Patient declined No - Patient declined No - Patient declined - No - Patient declined Yes (MAU/Ambulatory/Procedural Areas - Information given) No - Patient declined    Current Medications (verified) Outpatient Encounter Medications as of 12/30/2019  Medication Sig   loratadine (CLARITIN) 10 MG tablet TAKE 1 TABLET BY MOUTH DAILY AS NEEDED FOR ALLERGIES   losartan (COZAAR) 50 MG tablet TAKE 1 TABLET(50 MG) BY MOUTH DAILY FOR BLOOD PRESSURE   Multiple Vitamin (MULTIVITAMIN PO) Take 1 tablet by mouth daily.   simvastatin (ZOCOR) 20 MG tablet Take 1 tablet (20 mg total) by mouth daily at 6 PM.   traZODone (DESYREL) 100 MG tablet Take 1 tablet (100 mg total) by mouth at bedtime. For sleep and depression   No facility-administered encounter medications on file as of 12/30/2019.    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    Flatulence, eructation, and gas pain    Headache(784.0)    Hemorrhoids, external    Herpes zoster with unspecified complication    Hyperpotassemia    Insomnia, unspecified    Lumbago    Lumbago    Myalgia and myositis, unspecified    Nocturia    Obesity, unspecified    Osteoarthrosis, unspecified whether generalized or localized, unspecified site    Other abnormal blood chemistry    Other headache syndromes(339.89)    Pure hyperglyceridemia    Rash and other nonspecific skin eruption    Rotator cuff (capsule) sprain    Routine general medical examination at a health care facility    Tobacco use disorder    Unspecified essential hypertension    Unspecified hypertrophic and atrophic condition of skin    Unspecified sleep apnea    Past Surgical History:  Procedure Laterality Date   HEMORRHOID SURGERY     VARICOSE VEIN SURGERY     Family History  Problem Relation Age of Onset   Diabetes Mother    Heart disease Mother    Stroke Mother    Stroke Father    Social History   Socioeconomic History   Marital status: Married    Spouse name: Not on file   Number of children: Not on file   Years of education: Not on file   Highest education level: Not on file  Occupational History   Not on file  Tobacco Use   Smoking status: Former Smoker    Packs/day: 1.50    Years: 55.00  Pack years: 82.50    Types: Cigarettes    Quit date: 07/30/2015    Years since quitting: 4.4   Smokeless tobacco: Never Used   Tobacco comment: pt thinks increased effexor dose helped him to quit  Vaping Use   Vaping Use: Never used  Substance and Sexual Activity   Alcohol use: No    Alcohol/week: 0.0 standard drinks   Drug use: No   Sexual activity: Not on file  Other Topics Concern   Not on file  Social History Narrative   Married   Stopped smoking 07/30/15   Alcohol none   Exercise none   Social research officer, government 1967 1987      Social Determinants of Health   Financial Resource Strain:    Difficulty of Paying Living Expenses:   Food Insecurity:    Worried About Charity fundraiser in the Last Year:    Arboriculturist in the Last Year:   Transportation Needs:    Film/video editor (Medical):    Lack of Transportation (Non-Medical):   Physical Activity:    Days of Exercise per Week:    Minutes of Exercise per Session:   Stress:    Feeling of Stress :   Social Connections:    Frequency of Communication with Friends and Family:    Frequency of Social Gatherings with Friends and Family:    Attends Religious Services:    Active Member of Clubs or Organizations:    Attends Music therapist:    Marital Status:     Tobacco Counseling Counseling given: Not Answered Comment: pt thinks increased effexor dose helped him to quit   Clinical Intake:  Pre-visit preparation completed: No  Pain : No/denies pain     BMI - recorded: 28.61 Nutritional Status: BMI 25 -29 Overweight Nutritional Risks: None Diabetes: No  How often do you need to have someone help you when you read instructions, pamphlets, or other written materials from your doctor or pharmacy?: 3 - Sometimes What is the last grade level you completed in school?: 12 grade  Diabetic?no   Interpreter Needed?: No  Information entered by :: Pepin FNP-C   Activities of Daily Living In your present state of health, do you have any difficulty performing the following activities: 12/30/2019  Hearing? N  Vision? Y  Comment follows up with Opthalomology  Difficulty concentrating or making decisions? N  Walking or climbing stairs? N  Dressing or bathing? N  Doing errands, shopping? N  Preparing Food and eating ? N  Using the Toilet? N  In the past six months, have you accidently leaked urine? N  Do you have problems with loss of bowel control? N  Managing your Medications? N  Managing your Finances? N   Housekeeping or managing your Housekeeping? N  Some recent data might be hidden    Patient Care Team: Gayland Curry, DO as PCP - General (Geriatric Medicine) Katy Apo, MD as Consulting Physician (Ophthalmology)  Indicate any recent Medical Services you may have received from other than [facility] providers in the past year (date may be approximate).     Assessment:   This is a routine wellness examination for David [name].  Hearing/Vision screen  Hearing Screening   125Hz  250Hz  500Hz  1000Hz  2000Hz  3000Hz  4000Hz  6000Hz  8000Hz   Right ear:           Left ear:           Comments: No Hearing Concerns.   Vision Screening  Comments: No Vision Concerns. Patient wears prescription glasses for reading.   Dietary issues and exercise activities discussed: Current Exercise Habits: The patient does not participate in regular exercise at present, Exercise limited by: None identified  Goals     lose weight     Starting 11/16/16 I will start trying to walk to lose weight.      Quit smoking / using tobacco     Has quit smoking since 2017       Depression Screen PHQ 2/9 Scores 12/30/2019 12/01/2019 08/04/2019 07/31/2019 12/24/2018 07/29/2018 03/28/2018  PHQ - 2 Score 0 0 0 0 0 0 0  PHQ- 9 Score - - - - - - -    Fall Risk Fall Risk  12/30/2019 12/01/2019 07/31/2019 12/24/2018 07/29/2018  Falls in the past year? 1 0 0 0 0  Number falls in past yr: 1 0 0 0 0  Injury with Fall? 0 0 0 0 0    Any stairs in or around the home? No  If so, are there any without handrails? No  Home free of loose throw rugs in walkways, pet beds, electrical cords, etc? No  Adequate lighting in your home to reduce risk of falls? Yes   ASSISTIVE DEVICES UTILIZED TO PREVENT FALLS:  Life alert? No  Use of a cane, walker or w/c? No  Grab bars in the bathroom? No  Shower chair or bench in shower? No  Elevated toilet seat or a handicapped toilet? No   TIMED UP AND GO:  Was the test performed? No .  Length of time to  ambulate 10 feet: N/A   Cognitive Function: MMSE - Mini Mental State Exam 12/24/2018 12/20/2017 11/16/2016 11/08/2015 04/10/2014  Orientation to time 5 5 5 5 5   Orientation to Place 5 5 5 5 5   Registration 3 3 3 3 3   Attention/ Calculation 5 5 5 5 5   Recall 3 3 0 1 1  Language- name 2 objects 2 2 2 2 2   Language- repeat 1 1 1 1 1   Language- follow 3 step command 3 3 3 3 3   Language- read & follow direction 1 1 1 1 1   Write a sentence 1 1 1 1 1   Copy design 1 1 0 1 1  Total score 30 30 26 28 28      6CIT Screen 12/30/2019  What Year? 0 points  What month? 0 points  What time? 0 points  Count back from 20 0 points  Months in reverse 2 points  Repeat phrase 0 points  Total Score 2    Immunizations Immunization History  Administered Date(s) Administered   Fluad Quad(high Dose 65+) 03/31/2019   Influenza, High Dose Seasonal PF 04/03/2017, 03/28/2018   Influenza,inj,Quad PF,6+ Mos 06/13/2013, 04/10/2014, 05/06/2015, 05/12/2016   Influenza-Unspecified 04/09/2010, 05/09/2012   PFIZER SARS-COV-2 Vaccination 09/07/2019, 10/07/2019   Pneumococcal Conjugate-13 04/16/2014   Pneumococcal Polysaccharide-23 01/26/2011, 11/25/2015   Td 07/10/2000, 06/13/2013   Zoster 08/05/2012   Zoster Recombinat (Shingrix) 11/20/2016, 03/28/2017    TDAP status: Up to date Flu Vaccine status: Up to date Pneumococcal vaccine status: Up to date Covid-19 vaccine status: Completed vaccines  Qualifies for Shingles Vaccine? completed   Zostavax completed Yes   Shingrix Completed?: Yes  Screening Tests Health Maintenance  Topic Date Due   INFLUENZA VACCINE  02/08/2020   COLONOSCOPY  08/31/2020   TETANUS/TDAP  06/14/2023   COVID-19 Vaccine  Completed   Hepatitis C Screening  Completed   PNA vac Low  Risk Adult  Completed    Health Maintenance  There are no preventive care reminders to display for this patient.  Colorectal cancer screening: Completed 08/31/2010. Repeat every 08/31/2020  years  Lung Cancer Screening: (Low Dose CT Chest recommended if Age 43-80 years, 30 pack-year currently smoking OR have quit w/in 15years.) does qualify.   Lung Cancer Screening Referral: Declined   Additional Screening:  Hepatitis C Screening: does not qualify; Completed 11/03/2015  Vision Screening: Recommended annual ophthalmology exams for early detection of glaucoma and other disorders of the eye. Is the patient up to date with their annual eye exam?  Yes  Who is the provider or what is the name of the office in which the patient attends annual eye exams? Follows up with Ophthalmologist  If pt is not established with a provider, would they like to be referred to a provider to establish care? No . Has Opthalmology   Dental Screening: Recommended annual dental exams for proper oral hygiene  Community Resource Referral / Chronic Care Management: CRR required this visit?  No   CCM required this visit?  No      Plan:  - Declined low lung CT scan due to cost.Hx of smoking. I have personally reviewed and noted the following in the patients chart:    Medical and social history  Use of alcohol, tobacco or illicit drugs   Current medications and supplements  Functional ability and status  Nutritional status  Physical activity  Advanced directives  List of other physicians  Hospitalizations, surgeries, and ER visits in previous 12 months  Vitals  Screenings to include cognitive, depression, and falls  Referrals and appointments  In addition, I have reviewed and discussed with patient certain preventive protocols, quality metrics, and best practice recommendations. A written personalized care plan for preventive services as well as general preventive health recommendations were provided to patient.    Sandrea Hughs, NP   12/30/2019   Nurse Notes: dietary modification and exercise advised.

## 2019-12-30 NOTE — Patient Instructions (Signed)
[name] , Thank you for taking time to come for your Medicare Wellness Visit. I appreciate your ongoing commitment to your health goals. Please review the following plan we discussed and let me know if I can assist you in the future.   Screening recommendations/referrals: Colonoscopy Up to date  Recommended yearly ophthalmology/optometry visit for glaucoma screening and checkup Recommended yearly dental visit for hygiene and checkup  Vaccinations: Influenza vaccine: Up to date Pneumococcal vaccine : Up to date Tdap vaccine : Up to date due 06/14/2023  Shingles vaccine : Up to date    Advanced directives: No   Conditions/risks identified: Advance Age male > 57 yrs,male gender,Hypertension,Hx of Smoking,sedentary   Next appointment: 1 year   Preventive Care 39 Years and Older, Male Preventive care refers to lifestyle choices and visits with your health care provider that can promote health and wellness. What does preventive care include?  A yearly physical exam. This is also called an annual well check.  Dental exams once or twice a year.  Routine eye exams. Ask your health care provider how often you should have your eyes checked.  Personal lifestyle choices, including:  Daily care of your teeth and gums.  Regular physical activity.  Eating a healthy diet.  Avoiding tobacco and drug use.  Limiting alcohol use.  Practicing safe sex.  Taking low doses of aspirin every day.  Taking vitamin and mineral supplements as recommended by your health care provider. What happens during an annual well check? The services and screenings done by your health care provider during your annual well check will depend on your age, overall health, lifestyle risk factors, and family history of disease. Counseling  Your health care provider may ask you questions about your:  Alcohol use.  Tobacco use.  Drug use.  Emotional well-being.  Home and relationship well-being.  Sexual  activity.  Eating habits.  History of falls.  Memory and ability to understand (cognition).  Work and work Statistician. Screening  You may have the following tests or measurements:  Height, weight, and BMI.  Blood pressure.  Lipid and cholesterol levels. These may be checked every 5 years, or more frequently if you are over 110 years old.  Skin check.  Lung cancer screening. You may have this screening every year starting at age 60 if you have a 30-pack-year history of smoking and currently smoke or have quit within the past 15 years.  Fecal occult blood test (FOBT) of the stool. You may have this test every year starting at age 56.  Flexible sigmoidoscopy or colonoscopy. You may have a sigmoidoscopy every 5 years or a colonoscopy every 10 years starting at age 68.  Prostate cancer screening. Recommendations will vary depending on your family history and other risks.  Hepatitis C blood test.  Hepatitis B blood test.  Sexually transmitted disease (STD) testing.  Diabetes screening. This is done by checking your blood sugar (glucose) after you have not eaten for a while (fasting). You may have this done every 1-3 years.  Abdominal aortic aneurysm (AAA) screening. You may need this if you are a current or former smoker.  Osteoporosis. You may be screened starting at age 39 if you are at high risk. Talk with your health care provider about your test results, treatment options, and if necessary, the need for more tests. Vaccines  Your health care provider may recommend certain vaccines, such as:  Influenza vaccine. This is recommended every year.  Tetanus, diphtheria, and acellular pertussis (Tdap,  Td) vaccine. You may need a Td booster every 10 years.  Zoster vaccine. You may need this after age 102.  Pneumococcal 13-valent conjugate (PCV13) vaccine. One dose is recommended after age 53.  Pneumococcal polysaccharide (PPSV23) vaccine. One dose is recommended after age  67. Talk to your health care provider about which screenings and vaccines you need and how often you need them. This information is not intended to replace advice given to you by your health care provider. Make sure you discuss any questions you have with your health care provider. Document Released: 07/23/2015 Document Revised: 03/15/2016 Document Reviewed: 04/27/2015 Elsevier Interactive Patient Education  2017 [place] Prevention in the Home Falls can cause injuries. They can happen to people of all ages. There are many things you can do to make your home safe and to help prevent falls. What can I do on the outside of my home?  Regularly fix the edges of walkways and driveways and fix any cracks.  Remove anything that might make you trip as you walk through a door, such as a raised step or threshold.  Trim any bushes or trees on the path to your home.  Use bright outdoor lighting.  Clear any walking paths of anything that might make someone trip, such as rocks or tools.  Regularly check to see if handrails are loose or broken. Make sure that both sides of any steps have handrails.  Any raised decks and porches should have guardrails on the edges.  Have any leaves, snow, or ice cleared regularly.  Use sand or salt on walking paths during winter.  Clean up any spills in your garage right away. This includes oil or grease spills. What can I do in the bathroom?  Use night lights.  Install grab bars by the toilet and in the tub and shower. Do not use towel bars as grab bars.  Use non-skid mats or decals in the tub or shower.  If you need to sit down in the shower, use a plastic, non-slip stool.  Keep the floor dry. Clean up any water that spills on the floor as soon as it happens.  Remove soap buildup in the tub or shower regularly.  Attach bath mats securely with double-sided non-slip rug tape.  Do not have throw rugs and other things on the floor that can make  you trip. What can I do in the bedroom?  Use night lights.  Make sure that you have a light by your bed that is easy to reach.  Do not use any sheets or blankets that are too big for your bed. They should not hang down onto the floor.  Have a firm chair that has side arms. You can use this for support while you get dressed.  Do not have throw rugs and other things on the floor that can make you trip. What can I do in the kitchen?  Clean up any spills right away.  Avoid walking on wet floors.  Keep items that you use a lot in easy-to-reach places.  If you need to reach something above you, use a strong step stool that has a grab bar.  Keep electrical cords out of the way.  Do not use floor polish or wax that makes floors slippery. If you must use wax, use non-skid floor wax.  Do not have throw rugs and other things on the floor that can make you trip. What can I do with my stairs?  Do not  leave any items on the stairs.  Make sure that there are handrails on both sides of the stairs and use them. Fix handrails that are broken or loose. Make sure that handrails are as long as the stairways.  Check any carpeting to make sure that it is firmly attached to the stairs. Fix any carpet that is loose or worn.  Avoid having throw rugs at the top or bottom of the stairs. If you do have throw rugs, attach them to the floor with carpet tape.  Make sure that you have a light switch at the top of the stairs and the bottom of the stairs. If you do not have them, ask someone to add them for you. What else can I do to help prevent falls?  Wear shoes that:  Do not have high heels.  Have rubber bottoms.  Are comfortable and fit you well.  Are closed at the toe. Do not wear sandals.  If you use a stepladder:  Make sure that it is fully opened. Do not climb a closed stepladder.  Make sure that both sides of the stepladder are locked into place.  Ask someone to hold it for you, if  possible.  Clearly mark and make sure that you can see:  Any grab bars or handrails.  First and last steps.  Where the edge of each step is.  Use tools that help you move around (mobility aids) if they are needed. These include:  Canes.  Walkers.  Scooters.  Crutches.  Turn on the lights when you go into a dark area. Replace any light bulbs as soon as they burn out.  Set up your furniture so you have a clear path. Avoid moving your furniture around.  If any of your floors are uneven, fix them.  If there are any pets around you, be aware of where they are.  Review your medicines with your doctor. Some medicines can make you feel dizzy. This can increase your chance of falling. Ask your doctor what other things that you can do to help prevent falls. This information is not intended to replace advice given to you by your health care provider. Make sure you discuss any questions you have with your health care provider. Document Released: 04/22/2009 Document Revised: 12/02/2015 Document Reviewed: 07/31/2014 Elsevier Interactive Patient Education  2017 [employer].

## 2019-12-31 ENCOUNTER — Telehealth: Payer: Self-pay

## 2019-12-31 NOTE — Telephone Encounter (Signed)
Mr. gar, glance are scheduled for a virtual visit with your provider today.    Just as we do with appointments in the office, we must obtain your consent to participate.  Your consent will be active for this visit and any virtual visit you may have with one of our providers in the next 365 days.    If you have a MyChart account, I can also send a copy of this consent to you electronically.  All virtual visits are billed to your insurance company just like a traditional visit in the office.  As this is a virtual visit, video technology does not allow for your provider to perform a traditional examination.  This may limit your provider's ability to fully assess your condition.  If your provider identifies any concerns that need to be evaluated in person or the need to arrange testing such as labs, EKG, etc, we will make arrangements to do so.    Although advances in technology are sophisticated, we cannot ensure that it will always work on either your end or our end.  If the connection with a video visit is poor, we may have to switch to a telephone visit.  With either a video or telephone visit, we are not always able to ensure that we have a secure connection.   I need to obtain your verbal consent now.   Are you willing to proceed with your visit today?   Ronnette Juniper. has provided verbal consent on 12/30/2019 for a virtual visit (video or telephone).   [place], [place] 12/30/2019  9:30am

## 2020-03-08 ENCOUNTER — Other Ambulatory Visit: Payer: Self-pay | Admitting: Internal Medicine

## 2020-03-08 DIAGNOSIS — I10 Essential (primary) hypertension: Secondary | ICD-10-CM

## 2020-03-21 ENCOUNTER — Other Ambulatory Visit: Payer: Self-pay | Admitting: Internal Medicine

## 2020-03-21 DIAGNOSIS — I10 Essential (primary) hypertension: Secondary | ICD-10-CM

## 2020-04-01 ENCOUNTER — Other Ambulatory Visit: Payer: Self-pay

## 2020-04-01 ENCOUNTER — Ambulatory Visit (INDEPENDENT_AMBULATORY_CARE_PROVIDER_SITE_OTHER): Payer: Self-pay | Admitting: Internal Medicine

## 2020-04-01 DIAGNOSIS — I10 Essential (primary) hypertension: Secondary | ICD-10-CM

## 2020-04-01 DIAGNOSIS — R739 Hyperglycemia, unspecified: Secondary | ICD-10-CM

## 2020-04-01 DIAGNOSIS — E781 Pure hyperglyceridemia: Secondary | ICD-10-CM

## 2020-04-05 ENCOUNTER — Other Ambulatory Visit: Payer: Self-pay

## 2020-04-05 ENCOUNTER — Encounter: Payer: Self-pay | Admitting: Internal Medicine

## 2020-04-05 ENCOUNTER — Ambulatory Visit (INDEPENDENT_AMBULATORY_CARE_PROVIDER_SITE_OTHER): Payer: Medicare PPO | Admitting: Internal Medicine

## 2020-04-05 VITALS — BP 122/76 | HR 69 | Temp 97.7°F | Ht 70.0 in | Wt 202.0 lb

## 2020-04-05 DIAGNOSIS — F99 Mental disorder, not otherwise specified: Secondary | ICD-10-CM

## 2020-04-05 DIAGNOSIS — Z23 Encounter for immunization: Secondary | ICD-10-CM | POA: Diagnosis not present

## 2020-04-05 DIAGNOSIS — E781 Pure hyperglyceridemia: Secondary | ICD-10-CM

## 2020-04-05 DIAGNOSIS — R739 Hyperglycemia, unspecified: Secondary | ICD-10-CM | POA: Diagnosis not present

## 2020-04-05 DIAGNOSIS — F5105 Insomnia due to other mental disorder: Secondary | ICD-10-CM

## 2020-04-05 DIAGNOSIS — F331 Major depressive disorder, recurrent, moderate: Secondary | ICD-10-CM

## 2020-04-05 DIAGNOSIS — Z85820 Personal history of malignant melanoma of skin: Secondary | ICD-10-CM

## 2020-04-05 MED ORDER — SIMVASTATIN 20 MG PO TABS: 20.0000 mg | ORAL_TABLET | Freq: Every day | ORAL | 3 refills | Status: DC

## 2020-04-05 MED ORDER — TRAZODONE HCL 100 MG PO TABS: 100.0000 mg | ORAL_TABLET | Freq: Every day | ORAL | 3 refills | Status: DC

## 2020-04-05 NOTE — Progress Notes (Signed)
Location:  [facility] clinic Provider:  Gracen Ringwald L. Mariea Clonts, D.O., C.M.D.  Code Status: DNR Goals of Care:  Advanced Directives 04/05/2020  Does Patient Have a Medical Advance Directive? No  Would patient like information on creating a medical advance directive? No - Patient declined     Chief Complaint  Patient presents with  . Medical Management of Chronic Issues    4 month follow up  . Health Maintenance    Influenza     HPI: Patient is a 74 y.o. male seen today for medical management of chronic diseases.    He is interested in his flu shot today.  He is due for the covid booster at the end of November.  Info put on his AVS.    He is fasting for his labs.    His wife has been taking his trazodone when she runs out.  She is sometimes taking hers in error in the daytime and then running out early.  She cannot rest without it.  Then he's awake unable to get back to sleep. She won't let him manage her meds.    I encouraged him to get back to his walking routine to lose the 10 lbs he's gained back since end of year.  Needs tennis shoes.    Sciatica not bothersome lately.    He had a place frozen on his right anterior temple.  He had a boil lanced on his belt line.    Past Medical History:  Diagnosis Date  . Allergic rhinitis due to pollen   . Anxiety state, unspecified   . Asymptomatic varicose veins   . Atherosclerosis of native arteries of the extremities, unspecified   . Cataract 10/17/2016   L Eye  . Contact dermatitis and other eczema due to other chemical products   . 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 Flatulence, eructation, and gas pain   . Headache(784.0)   . Hemorrhoids, external   . Herpes zoster with unspecified complication   . Hyperpotassemia   . Insomnia, unspecified   . Lumbago   . Lumbago   . Myalgia and myositis, unspecified   . Nocturia   . Obesity, unspecified   . Osteoarthrosis, unspecified whether generalized or  localized, unspecified site   . Other abnormal blood chemistry   . Other headache syndromes(339.89)   . Pure hyperglyceridemia   . Rash and other nonspecific skin eruption   . Rotator cuff (capsule) sprain   . Routine general medical examination at a health care facility   . Tobacco use disorder   . Unspecified essential hypertension   . Unspecified hypertrophic and atrophic condition of skin   . Unspecified sleep apnea     Past Surgical History:  Procedure Laterality Date  . HEMORRHOID SURGERY    . VARICOSE VEIN SURGERY      Allergies  Allergen Reactions  . Celebrex [Celecoxib]   . Erythromycin   . Oxycontin [Oxycodone Hcl]   . Penicillins   . Tetracyclines & Related     Outpatient Encounter Medications as of 04/05/2020  Medication Sig  . loratadine (CLARITIN) 10 MG tablet TAKE 1 TABLET BY MOUTH DAILY AS NEEDED FOR ALLERGIES  . losartan (COZAAR) 50 MG tablet TAKE 1 TABLET(50 MG) BY MOUTH DAILY FOR BLOOD PRESSURE  . Multiple Vitamin (MULTIVITAMIN PO) Take 1 tablet by mouth daily.  . simvastatin (ZOCOR) 20 MG tablet Take 1 tablet (20 mg total) by mouth daily at 6 PM.  . traZODone (  DESYREL) 100 MG tablet Take 1 tablet (100 mg total) by mouth at bedtime. For sleep and depression   No facility-administered encounter medications on file as of 04/05/2020.    Review of Systems:  Review of Systems  Constitutional: Negative for chills, fever and malaise/fatigue.  HENT: Positive for hearing loss. Negative for congestion and sore throat.   Eyes: Negative for blurred vision.  Respiratory: Negative for cough and shortness of breath.   Cardiovascular: Negative for chest pain, palpitations and leg swelling.  Gastrointestinal: Negative for abdominal pain, blood in stool, constipation and melena.  Genitourinary: Negative for dysuria.  Musculoskeletal: Positive for back pain. Negative for falls and joint pain.       Sciatica not bad lately  Skin: Negative for itching and rash.   Neurological: Positive for tingling and sensory change. Negative for dizziness and loss of consciousness.  Psychiatric/Behavioral: Negative for depression and memory loss. The patient has insomnia. The patient is not nervous/anxious.     Health Maintenance  Topic Date Due  . INFLUENZA VACCINE  02/08/2020  . COLONOSCOPY  08/31/2020  . TETANUS/TDAP  06/14/2023  . COVID-19 Vaccine  Completed  . Hepatitis C Screening  Completed  . PNA vac Low Risk Adult  Completed    Physical Exam: Vitals:   04/05/20 1048  BP: 122/76  Pulse: 69  Temp: 97.7 F (36.5 C)  TempSrc: Temporal  SpO2: 97%  Weight: 202 lb (91.6 kg)  Height: 5\' 10"  (1.778 m)   Body mass index is 28.98 kg/m. Physical Exam Vitals reviewed.  Constitutional:      General: He is not in acute distress.    Appearance: Normal appearance. He is not toxic-appearing.  HENT:     Head: Normocephalic and atraumatic.  Cardiovascular:     Rate and Rhythm: Normal rate and regular rhythm.     Pulses: Normal pulses.     Heart sounds: Normal heart sounds.  Pulmonary:     Effort: Pulmonary effort is normal.     Breath sounds: Normal breath sounds. No wheezing, rhonchi or rales.  Abdominal:     General: Bowel sounds are normal.     Palpations: Abdomen is soft.  Musculoskeletal:        General: Normal range of motion.     Right lower leg: No edema.     Left lower leg: No edema.  Skin:    General: Skin is warm and dry.  Neurological:     General: No focal deficit present.     Mental Status: He is alert and oriented to person, place, and time. Mental status is at baseline.  Psychiatric:        Mood and Affect: Mood normal.        Behavior: Behavior normal.     Labs reviewed: Basic Metabolic Panel: Recent Labs    07/31/19 0839 12/01/19 0916  NA 139 139  K 4.1 4.1  CL 106 107  CO2 25 24  GLUCOSE 116* 108*  BUN 17 16  CREATININE 0.72 0.71  CALCIUM 9.3 9.5   Liver Function Tests: No results for input(s): AST, ALT,  ALKPHOS, BILITOT, PROT, ALBUMIN in the last 8760 hours. No results for input(s): LIPASE, AMYLASE in the last 8760 hours. No results for input(s): AMMONIA in the last 8760 hours. CBC: Recent Labs    07/31/19 0839  WBC 5.8  NEUTROABS 3,735  HGB 15.0  HCT 44.7  MCV 92.5  PLT 205   Lipid Panel: Recent Labs    07/31/19  0839  CHOL 108  HDL 42  LDLCALC 45  TRIG 127  CHOLHDL 2.6   Lab Results  Component Value Date   HGBA1C 5.3 12/01/2019     Assessment/Plan 1. Hyperglycemia - not walking like he'd been, weight up a few lbs from last time so encouraged gradual return to walking problem after purchase of athletic shoes - simvastatin (ZOCOR) 20 MG tablet; Take 1 tablet (20 mg total) by mouth daily at 6 PM.  Dispense: 90 tablet; Refill: 3  2. Hypertriglyceridemia - f/u labs today, cont zocor therapy for now--needed refills - simvastatin (ZOCOR) 20 MG tablet; Take 1 tablet (20 mg total) by mouth daily at 6 PM.  Dispense: 90 tablet; Refill: 3  3. Insomnia due to other mental disorder -sleeps well as long as his wife does not waken him in the middle of the night - traZODone (DESYREL) 100 MG tablet; Take 1 tablet (100 mg total) by mouth at bedtime. For sleep and depression  Dispense: 90 tablet; Refill: 3  4. Moderate episode of recurrent major depressive disorder ([place]) - doing well, renew medication - traZODone (DESYREL) 100 MG tablet; Take 1 tablet (100 mg total) by mouth at bedtime. For sleep and depression  Dispense: 90 tablet; Refill: 3  5. History of melanoma -just had routine skin exam and no serious concerns found  6. Need for influenza vaccination - Flu Vaccine QUAD High Dose(Fluad)   Labs/tests ordered:  Labs today, next labs same day as appt:   Lab Orders     CBC with Differential/Platelet     COMPLETE METABOLIC PANEL WITH GFR     Lipid panel     Hemoglobin A1c  Next appt:  08/02/2020    Margarette Vannatter L. Lavergne Hiltunen, D.O. [facility] [address], [place] [address] Cell Phone (Mon-Fri 8am-5pm):  [phone] On Call:  [phone] & follow prompts after 5pm & weekends Office Phone:  [phone] Office Fax:  [phone]

## 2020-04-05 NOTE — Patient Instructions (Addendum)
I recommend you get a pfizer booster after 06/08/20.     Please get some tennis shoes and start walking gradually--10 minutes three times a week to start.

## 2020-04-06 LAB — LIPID PANEL
Cholesterol: 138 mg/dL (ref <200)
HDL: 44 mg/dL (ref >=40)
LDL Cholesterol (Calc): 72 mg/dL (calc)
Non-HDL Cholesterol (Calc): 94 mg/dL (calc) (ref <130)
Total CHOL/HDL Ratio: 3.1 (calc) (ref <5.0)
Triglycerides: 138 mg/dL (ref <150)

## 2020-04-06 LAB — CBC WITH DIFFERENTIAL/PLATELET
Absolute Monocytes: 499 cells/uL (ref 200–950)
Basophils Absolute: 62 cells/uL (ref 0–200)
Basophils Relative: 1.3 %
Eosinophils Absolute: 38 cells/uL (ref 15–500)
Eosinophils Relative: 0.8 %
HCT: 47 % (ref 38.5–50.0)
Hemoglobin: 15.6 g/dL (ref 13.2–17.1)
Lymphs Abs: 1301 cells/uL (ref 850–3900)
MCH: 31.5 pg (ref 27.0–33.0)
MCHC: 33.2 g/dL (ref 32.0–36.0)
MCV: 94.9 fL (ref 80.0–100.0)
MPV: 12.2 fL (ref 7.5–12.5)
Monocytes Relative: 10.4 %
Neutro Abs: 2899 cells/uL (ref 1500–7800)
Neutrophils Relative %: 60.4 %
Platelets: 185 10*3/uL (ref 140–400)
RBC: 4.95 10*6/uL (ref 4.20–5.80)
RDW: 12.3 % (ref 11.0–15.0)
Total Lymphocyte: 27.1 %
WBC: 4.8 10*3/uL (ref 3.8–10.8)

## 2020-04-06 LAB — COMPLETE METABOLIC PANEL WITH GFR
AG Ratio: 1.7 (calc) (ref 1.0–2.5)
ALT: 10 U/L (ref 9–46)
AST: 15 U/L (ref 10–35)
Albumin: 4.5 g/dL (ref 3.6–5.1)
Alkaline phosphatase (APISO): 58 U/L (ref 35–144)
BUN: 15 mg/dL (ref 7–25)
CO2: 26 mmol/L (ref 20–32)
Calcium: 9.6 mg/dL (ref 8.6–10.3)
Chloride: 105 mmol/L (ref 98–110)
Creat: 0.74 mg/dL (ref 0.70–1.18)
GFR, Est African American: 105 mL/min/{1.73_m2} (ref >=60)
GFR, Est Non African American: 91 mL/min/{1.73_m2} (ref >=60)
Globulin: 2.6 g/dL (calc) (ref 1.9–3.7)
Glucose, Bld: 105 mg/dL — ABNORMAL HIGH (ref 65–99)
Potassium: 4.8 mmol/L (ref 3.5–5.3)
Sodium: 139 mmol/L (ref 135–146)
Total Bilirubin: 0.5 mg/dL (ref 0.2–1.2)
Total Protein: 7.1 g/dL (ref 6.1–8.1)

## 2020-04-06 LAB — HEMOGLOBIN A1C
Hgb A1c MFr Bld: 5.5 % of total Hgb (ref <5.7)
Mean Plasma Glucose: 111 (calc)
eAG (mmol/L): 6.2 (calc)

## 2020-04-06 NOTE — Progress Notes (Signed)
Blood counts, kidneys, electrolytes and sugar average were all in normal range. Cholesterol remains ok, but has crept up since he has not been exercising.  Intervention is to gradually return to walking after purchasing walking shoes.

## 2020-04-07 ENCOUNTER — Other Ambulatory Visit: Payer: Self-pay | Admitting: Internal Medicine

## 2020-04-07 DIAGNOSIS — E781 Pure hyperglyceridemia: Secondary | ICD-10-CM

## 2020-04-07 DIAGNOSIS — R739 Hyperglycemia, unspecified: Secondary | ICD-10-CM

## 2020-04-08 ENCOUNTER — Ambulatory Visit: Payer: Medicare PPO | Admitting: Internal Medicine

## 2020-05-06 ENCOUNTER — Ambulatory Visit: Payer: Medicare PPO | Attending: Internal Medicine

## 2020-05-06 DIAGNOSIS — Z23 Encounter for immunization: Secondary | ICD-10-CM

## 2020-05-06 NOTE — Progress Notes (Signed)
   [id number]  Name:  David [name].    MRN: [id number] DOB: [birth year]  05/06/2020  David [name] was observed post Covid-19 immunization for 30 minutes based on pre-vaccination screening without incident. He was provided with Vaccine Information Sheet and instruction to access the V-Safe system.   David [name] was instructed to call 911 with any severe reactions post vaccine: Marland Kitchen Difficulty breathing  . Swelling of face and throat  . A fast heartbeat  . A bad rash all over body  . Dizziness and weakness

## 2020-06-28 ENCOUNTER — Other Ambulatory Visit: Payer: Self-pay | Admitting: *Deleted

## 2020-06-28 DIAGNOSIS — F99 Mental disorder, not otherwise specified: Secondary | ICD-10-CM

## 2020-06-28 DIAGNOSIS — F331 Major depressive disorder, recurrent, moderate: Secondary | ICD-10-CM

## 2020-06-28 MED ORDER — TRAZODONE HCL 100 MG PO TABS: 100.0000 mg | ORAL_TABLET | Freq: Every day | ORAL | 1 refills | Status: DC

## 2020-06-28 NOTE — Telephone Encounter (Signed)
Patient walked into office requesting refill on his Trazodone.  Oked to refill per Dr. Mariea Clonts.

## 2020-07-26 ENCOUNTER — Ambulatory Visit: Payer: Medicare PPO | Admitting: Internal Medicine

## 2020-07-28 ENCOUNTER — Other Ambulatory Visit: Payer: Self-pay | Admitting: *Deleted

## 2020-07-28 DIAGNOSIS — F5105 Insomnia due to other mental disorder: Secondary | ICD-10-CM

## 2020-07-28 DIAGNOSIS — F99 Mental disorder, not otherwise specified: Secondary | ICD-10-CM

## 2020-07-28 DIAGNOSIS — F331 Major depressive disorder, recurrent, moderate: Secondary | ICD-10-CM

## 2020-07-28 MED ORDER — TRAZODONE HCL 100 MG PO TABS: 100.0000 mg | ORAL_TABLET | Freq: Every day | ORAL | 0 refills | Status: DC

## 2020-07-28 NOTE — Telephone Encounter (Signed)
Patient requested refill

## 2020-08-02 ENCOUNTER — Ambulatory Visit: Payer: Medicare PPO | Admitting: Internal Medicine

## 2020-08-12 ENCOUNTER — Encounter: Payer: Self-pay | Admitting: Family

## 2020-08-12 ENCOUNTER — Ambulatory Visit: Payer: Medicare PPO | Admitting: Internal Medicine

## 2020-08-12 ENCOUNTER — Other Ambulatory Visit: Payer: Self-pay

## 2020-08-12 ENCOUNTER — Encounter: Payer: Medicare PPO | Admitting: Family

## 2020-08-13 ENCOUNTER — Ambulatory Visit (INDEPENDENT_AMBULATORY_CARE_PROVIDER_SITE_OTHER): Payer: Medicare PPO | Admitting: Orthopedic Surgery

## 2020-08-13 ENCOUNTER — Encounter: Payer: Self-pay | Admitting: Orthopedic Surgery

## 2020-08-13 VITALS — BP 130/90 | HR 69 | Temp 97.5°F | Resp 20 | Ht 70.0 in | Wt 208.8 lb

## 2020-08-13 DIAGNOSIS — F331 Major depressive disorder, recurrent, moderate: Secondary | ICD-10-CM

## 2020-08-13 DIAGNOSIS — Z9109 Other allergy status, other than to drugs and biological substances: Secondary | ICD-10-CM

## 2020-08-13 DIAGNOSIS — R739 Hyperglycemia, unspecified: Secondary | ICD-10-CM | POA: Diagnosis not present

## 2020-08-13 DIAGNOSIS — F99 Mental disorder, not otherwise specified: Secondary | ICD-10-CM

## 2020-08-13 DIAGNOSIS — F5105 Insomnia due to other mental disorder: Secondary | ICD-10-CM

## 2020-08-13 DIAGNOSIS — E781 Pure hyperglyceridemia: Secondary | ICD-10-CM | POA: Diagnosis not present

## 2020-08-13 DIAGNOSIS — I10 Essential (primary) hypertension: Secondary | ICD-10-CM | POA: Diagnosis not present

## 2020-08-13 DIAGNOSIS — E663 Overweight: Secondary | ICD-10-CM

## 2020-08-13 DIAGNOSIS — D692 Other nonthrombocytopenic purpura: Secondary | ICD-10-CM

## 2020-08-13 MED ORDER — SIMVASTATIN 20 MG PO TABS: 20.0000 mg | ORAL_TABLET | Freq: Every day | ORAL | 1 refills | Status: DC

## 2020-08-13 MED ORDER — LORATADINE 10 MG PO TABS
ORAL_TABLET | ORAL | 3 refills | Status: DC
Start: 2020-08-13 — End: 2020-11-18

## 2020-08-13 MED ORDER — TRAZODONE HCL 100 MG PO TABS: 100.0000 mg | ORAL_TABLET | Freq: Every day | ORAL | 1 refills | Status: DC

## 2020-08-13 NOTE — Progress Notes (Signed)
Careteam: Patient Care Team: Gayland Curry, DO as PCP - General (Geriatric Medicine) Katy Apo, MD as Consulting Physician (Ophthalmology)  Seen by: Windell Moulding, AGNP-C  PLACE OF SERVICE:  [place] Directive information Does Patient Have a Medical Advance Directive?: No, Would patient like information on creating a medical advance directive?: No - Patient declined  Allergies  Allergen Reactions  . Celebrex [Celecoxib]   . Erythromycin   . Oxycontin [Oxycodone Hcl]   . Penicillins   . Tetracyclines & Related     Chief Complaint  Patient presents with  . Medical Management of Chronic Issues    Follow Up     HPI: Patient is a 75 y.o. male seen today for medical management of chronic diseases.   He notes a small cyst on his left wrist. Randomly appears. Denies pain. Does not limit movement.   Starting to notice purple spots on his upper arms. Reports his father use to have them as he got older. They do not bother him. Complains of dry and thin skin. Thinks it is because of the winter months. Denies itching, lesions or sores.   He has gained 6 pounds form last encounter. He is not walking due to the cold weather. He is hoping to start in the spring when the weather is warmer.   His diet has not changed much. Eats breakfast and dinner only. Also goes out for [employer] once a week with his wife. Prepares his own meals.   Sciatica has not bothered him in awhile.   Still taking trazodone for sleep, but does not feel completely rested. Upset he does not sleep in and wakes up every morning at 6 am. Does not use bathroom in middle of the night. Denies excessive daytime sleepiness.   He is worried about his wife. Waiting for doctors to tell him if it is dementia or Alzheimers. She is not taking all of her prescribed medication. His wife's illness is making him repeat himself over and over to his wife. At times it is very frustrating. He denies any stress  relieving activities at this time.   Received covid booster, tolerated well.   Review of Systems:  Review of Systems  Constitutional: Negative for fever and malaise/fatigue.  HENT: Positive for hearing loss. Negative for congestion.        Allerigies  Eyes: Negative for blurred vision and double vision.  Respiratory: Negative for cough, shortness of breath and wheezing.   Cardiovascular: Negative for chest pain and leg swelling.  Gastrointestinal: Negative for abdominal pain, heartburn, nausea and vomiting.  Genitourinary: Negative for dysuria, frequency and hematuria.  Musculoskeletal: Positive for joint pain. Negative for falls.       Sciatica, left hip pain  Skin:       Dry skin  Neurological: Negative for dizziness, weakness and headaches.  Endo/Heme/Allergies: Bruises/bleeds easily.  Psychiatric/Behavioral: Negative for depression. The patient has insomnia. The patient is not nervous/anxious.     Past Medical History:  Diagnosis Date  . Allergic rhinitis due to pollen   . Anxiety state, unspecified   . Asymptomatic varicose veins   . Atherosclerosis of native arteries of the extremities, unspecified   . Cataract 10/17/2016   L Eye  . Contact dermatitis and other eczema due to other chemical products   . 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 Flatulence, eructation, and gas pain   . Headache(784.0)   . Hemorrhoids, external   .  Herpes zoster with unspecified complication   . Hyperpotassemia   . Insomnia, unspecified   . Lumbago   . Lumbago   . Myalgia and myositis, unspecified   . Nocturia   . Obesity, unspecified   . Osteoarthrosis, unspecified whether generalized or localized, unspecified site   . Other abnormal blood chemistry   . Other headache syndromes(339.89)   . Pure hyperglyceridemia   . Rash and other nonspecific skin eruption   . Rotator cuff (capsule) sprain   . Routine general medical examination at a health care facility    . Tobacco use disorder   . Unspecified essential hypertension   . Unspecified hypertrophic and atrophic condition of skin   . Unspecified sleep apnea    Past Surgical History:  Procedure Laterality Date  . HEMORRHOID SURGERY    . VARICOSE VEIN SURGERY     Social History:   reports that he quit smoking about 5 years ago. His smoking use included cigarettes. He has a 82.50 pack-year smoking history. He has never used smokeless tobacco. He reports that he does not drink alcohol and does not use drugs.  Family History  Problem Relation Age of Onset  . Diabetes Mother   . Heart disease Mother   . Stroke Mother   . Stroke Father     Medications: Patient's Medications  New Prescriptions   No medications on file  Previous Medications   LORATADINE (CLARITIN) 10 MG TABLET    TAKE 1 TABLET BY MOUTH DAILY AS NEEDED FOR ALLERGIES   LOSARTAN (COZAAR) 50 MG TABLET    TAKE 1 TABLET(50 MG) BY MOUTH DAILY FOR BLOOD PRESSURE   MULTIPLE VITAMIN (MULTIVITAMIN PO)    Take 1 tablet by mouth daily.   SIMVASTATIN (ZOCOR) 20 MG TABLET    Take 1 tablet (20 mg total) by mouth daily at 6 PM.   TRAZODONE (DESYREL) 100 MG TABLET    Take 1 tablet (100 mg total) by mouth at bedtime. For sleep and depression  Modified Medications   No medications on file  Discontinued Medications   No medications on file    Physical Exam:  Vitals:   08/13/20 0821  BP: 130/90  Pulse: 69  Resp: 20  Temp: (!) 97.5 F (36.4 C)  TempSrc: Temporal  SpO2: 95%  Weight: 208 lb 12.8 oz (94.7 kg)  Height: 5\' 10"  (1.778 m)   Body mass index is 29.96 kg/m. Wt Readings from Last 3 Encounters:  08/13/20 208 lb 12.8 oz (94.7 kg)  08/12/20 208 lb 9.6 oz (94.6 kg)  04/05/20 202 lb (91.6 kg)    Physical Exam Vitals reviewed.  Constitutional:      General: He is not in acute distress.    Appearance: Normal appearance.  HENT:     Head: Normocephalic.     Right Ear: There is no impacted cerumen.     Left Ear: There is  no impacted cerumen.     Nose: Nose normal.     Mouth/Throat:     Mouth: Mucous membranes are moist.  Eyes:     General:        Right eye: No discharge.        Left eye: No discharge.  Neck:     Vascular: No carotid bruit.  Cardiovascular:     Rate and Rhythm: Normal rate and regular rhythm.     Pulses: Normal pulses.     Heart sounds: Normal heart sounds. No murmur heard.   Pulmonary:  Effort: Pulmonary effort is normal. No respiratory distress.     Breath sounds: Normal breath sounds. No wheezing.  Abdominal:     General: Bowel sounds are normal. There is no distension.     Palpations: Abdomen is soft.     Tenderness: There is no abdominal tenderness.  Musculoskeletal:     Cervical back: Normal range of motion.     Right lower leg: No edema.     Left lower leg: No edema.  Lymphadenopathy:     Cervical: No cervical adenopathy.  Skin:    General: Skin is warm and dry.     Capillary Refill: Capillary refill takes less than 2 seconds.     Comments: 2 Pea-sized purple marks on left arm, not raised. No skin openings or drainage.   Neurological:     General: No focal deficit present.     Mental Status: He is alert and oriented to person, place, and time.     Motor: No weakness.     Gait: Gait normal.  Psychiatric:        Mood and Affect: Mood normal.        Behavior: Behavior normal.     Labs reviewed: Basic Metabolic Panel: Recent Labs    12/01/19 0916 04/05/20 1144  NA 139 139  K 4.1 4.8  CL 107 105  CO2 24 26  GLUCOSE 108* 105*  BUN 16 15  CREATININE 0.71 0.74  CALCIUM 9.5 9.6   Liver Function Tests: Recent Labs    04/05/20 1144  AST 15  ALT 10  BILITOT 0.5  PROT 7.1   No results for input(s): LIPASE, AMYLASE in the last 8760 hours. No results for input(s): AMMONIA in the last 8760 hours. CBC: Recent Labs    04/05/20 1144  WBC 4.8  NEUTROABS 2,899  HGB 15.6  HCT 47.0  MCV 94.9  PLT 185   Lipid Panel: Recent Labs    04/05/20 1144   CHOL 138  HDL 44  LDLCALC 72  TRIG 138  CHOLHDL 3.1   TSH: No results for input(s): TSH in the last 8760 hours. A1C: Lab Results  Component Value Date   HGBA1C 5.5 04/05/2020     Assessment/Plan 1. Essential hypertension - bp at goal < 150/90 - continue losartan reigmen - continue to limit sodium from diet, < 2000 mg/day - CBC with Differential/Platelets - CMP  2. Hyperglycemia - 6 lbs heavier from last visit - advised him to start walking when weather improves - recommend 150 min/week of walking - Hemoglobin A1c - simvastatin (ZOCOR) 20 MG tablet; Take 1 tablet (20 mg total) by mouth daily at 6 PM.  Dispense: 90 tablet; Refill: 1  3. Overweight (BMI 25.0-29.9) - 6 lbs heavier from last visit - advised him to start walking when weather improves - recommend 150 min/week of walking - recommend counting calories, < 2000 calories/day  4. Hypertriglyceridemia - stable with zocor - Lipid Panel - simvastatin (ZOCOR) 20 MG tablet; Take 1 tablet (20 mg total) by mouth daily at 6 PM.  Dispense: 90 tablet; Refill: 1  5. Insomnia due to other mental disorder - sleeping during the night, but does not feel rested, denies excessive daytime sleepiness - traZODone (DESYREL) 100 MG tablet; Take 1 tablet (100 mg total) by mouth at bedtime. For sleep and depression  Dispense: 30 tablet; Refill: 1  6. Moderate episode of recurrent major depressive disorder ([place]) - admits to stress taking care of wife with dementia - cont  medication - recommend exercising to help with mental health - traZODone (DESYREL) 100 MG tablet; Take 1 tablet (100 mg total) by mouth at bedtime. For sleep and depression  Dispense: 30 tablet; Refill: 1  7. Environmental allergies - stable with clairitn - loratadine (CLARITIN) 10 MG tablet; TAKE 1 TABLET BY MOUTH DAILY AS NEEDED FOR ALLERGIES  Dispense: 90 tablet; Refill: 3  8. Senile purpura (HCC) - stable, 2 spots on left arm identified  I provided 33  minutes of face-to-face time during this encounter.   Next appt:Visit date not found [place], [facility] [phone]

## 2020-08-13 NOTE — Patient Instructions (Signed)
Preventing Unhealthy Weight Gain, Adult Staying at a healthy weight is important to your overall health. When fat builds up in your body, you may become overweight or obese. Being overweight or obese increases your risk of developing certain health problems, such as heart disease, diabetes, sleeping problems, joint problems, and some types of cancer. Unhealthy weight gain is often the result of making unhealthy food choices or not getting enough exercise. You can make changes to your lifestyle to prevent obesity and stay as healthy as possible. What nutrition changes can be made?  Eat only as much as your body needs. To do this: ? Pay attention to signs that you are hungry or full. Stop eating as soon as you feel full. ? If you feel hungry, try drinking water first before eating. Drink enough water so your urine is clear or pale yellow. ? Eat smaller portions. Pay attention to portion sizes when eating out. ? Look at serving sizes on food labels. Most foods contain more than one serving per container. ? Eat the recommended number of calories for your gender and activity level. For most active people, a daily total of 2,000 calories is appropriate. If you are trying to lose weight or are not very active, you may need to eat fewer calories. Talk with your health care provider or a diet and nutrition specialist (dietitian) about how many calories you need each day.  Choose healthy foods, such as: ? Fruits and vegetables. At each meal, try to fill at least half of your plate with fruits and vegetables. ? Whole grains, such as whole-wheat bread, brown rice, and quinoa. ? Lean meats, such as chicken or fish. ? Other healthy proteins, such as beans, eggs, or tofu. ? Healthy fats, such as nuts, seeds, fatty fish, and olive oil. ? Low-fat or fat-free dairy products.  Check food labels, and avoid food and drinks that: ? Are high in calories. ? Have added sugar. ? Are high in sodium. ? Have saturated  fats or trans fats.  Cook foods in healthier ways, such as by baking, broiling, or grilling.  Make a meal plan for the week, and shop with a grocery list to help you stay on track with your purchases. Try to avoid going to the grocery store when you are hungry.  When grocery shopping, try to shop around the outside of the store first, where the fresh foods are. Doing this helps you to avoid prepackaged foods, which can be high in sugar, salt (sodium), and fat.   What lifestyle changes can be made?  Exercise for 30 or more minutes on 5 or more days each week. Exercising may include brisk walking, yard work, biking, running, swimming, and team sports like basketball and soccer. Ask your health care provider which exercises are safe for you.  Do muscle-strengthening activities, such as lifting weights or using resistance bands, on 2 or more days a week.  Do not use any products that contain nicotine or tobacco, such as cigarettes and e-cigarettes. If you need help quitting, ask your health care provider.  Limit alcohol intake to no more than 1 drink a day for nonpregnant women and 2 drinks a day for men. One drink equals 12 oz of beer, 5 oz of wine, or 1 oz of hard liquor.  Try to get 7-9 hours of sleep each night.   What other changes can be made?  Keep a food and activity journal to keep track of: ? What you ate and how  many calories you had. Remember to count the calories in sauces, dressings, and side dishes. ? Whether you were active, and what exercises you did. ? Your calorie, weight, and activity goals.  Check your weight regularly. Track any changes. If you notice you have gained weight, make changes to your diet or activity routine.  Avoid taking weight-loss medicines or supplements. Talk to your health care provider before starting any new medicine or supplement.  Talk to your health care provider before trying any new diet or exercise plan. Why are these changes  important? Eating healthy, staying active, and having healthy habits can help you to prevent obesity. Those changes also:  Help you manage stress and emotions.  Help you connect with friends and family.  Improve your self-esteem.  Improve your sleep.  Prevent long-term health problems. What can happen if changes are not made? Being obese or overweight can cause you to develop joint or bone problems, which can make it hard for you to stay active or do activities you enjoy. Being obese or overweight also puts stress on your heart and lungs and can lead to health problems like diabetes, heart disease, and some cancers. Where to find more information Talk with your health care provider or a dietitian about healthy eating and healthy lifestyle choices. You may also find information from:  U.[place]. Department of Agriculture, MyPlate: FormerBoss.no  American Heart Association: www.heart.org  Centers for Disease Control and Prevention: http://www.wolf.info/ Summary  Staying at a healthy weight is important to your overall health. It helps you to prevent certain diseases and health problems, such as heart disease, diabetes, joint problems, sleep disorders, and some types of cancer.  Being obese or overweight can cause you to develop joint or bone problems, which can make it hard for you to stay active or do activities you enjoy.  You can prevent unhealthy weight gain by eating a healthy diet, exercising regularly, not smoking, limiting alcohol, and getting enough sleep.  Talk with your health care provider or a dietitian for guidance about healthy eating and healthy lifestyle choices. This information is not intended to replace advice given to you by your health care provider. Make sure you discuss any questions you have with your health care provider. Document Revised: 10/23/2019 Document Reviewed: 10/23/2019 Elsevier Patient Education  2021 [employer].

## 2020-08-14 LAB — COMPREHENSIVE METABOLIC PANEL
AG Ratio: 1.6 (calc) (ref 1.0–2.5)
ALT: 10 U/L (ref 9–46)
AST: 15 U/L (ref 10–35)
Albumin: 4.1 g/dL (ref 3.6–5.1)
Alkaline phosphatase (APISO): 54 U/L (ref 35–144)
BUN/Creatinine Ratio: 22 (calc) (ref 6–22)
BUN: 15 mg/dL (ref 7–25)
CO2: 29 mmol/L (ref 20–32)
Calcium: 9.3 mg/dL (ref 8.6–10.3)
Chloride: 105 mmol/L (ref 98–110)
Creat: 0.68 mg/dL — ABNORMAL LOW (ref 0.70–1.18)
Globulin: 2.6 g/dL (calc) (ref 1.9–3.7)
Glucose, Bld: 102 mg/dL — ABNORMAL HIGH (ref 65–99)
Potassium: 4.5 mmol/L (ref 3.5–5.3)
Sodium: 140 mmol/L (ref 135–146)
Total Bilirubin: 0.6 mg/dL (ref 0.2–1.2)
Total Protein: 6.7 g/dL (ref 6.1–8.1)

## 2020-08-14 LAB — CBC WITH DIFFERENTIAL/PLATELET
Absolute Monocytes: 495 cells/uL (ref 200–950)
Basophils Absolute: 59 cells/uL (ref 0–200)
Basophils Relative: 1.2 %
Eosinophils Absolute: 93 cells/uL (ref 15–500)
Eosinophils Relative: 1.9 %
HCT: 45.3 % (ref 38.5–50.0)
Hemoglobin: 15.1 g/dL (ref 13.2–17.1)
Lymphs Abs: 1230 cells/uL (ref 850–3900)
MCH: 31.5 pg (ref 27.0–33.0)
MCHC: 33.3 g/dL (ref 32.0–36.0)
MCV: 94.4 fL (ref 80.0–100.0)
MPV: 12.3 fL (ref 7.5–12.5)
Monocytes Relative: 10.1 %
Neutro Abs: 3023 cells/uL (ref 1500–7800)
Neutrophils Relative %: 61.7 %
Platelets: 189 10*3/uL (ref 140–400)
RBC: 4.8 10*6/uL (ref 4.20–5.80)
RDW: 12 % (ref 11.0–15.0)
Total Lymphocyte: 25.1 %
WBC: 4.9 10*3/uL (ref 3.8–10.8)

## 2020-08-14 LAB — LIPID PANEL
Cholesterol: 121 mg/dL (ref <200)
HDL: 37 mg/dL — ABNORMAL LOW (ref >=40)
LDL Cholesterol (Calc): 63 mg/dL (calc)
Non-HDL Cholesterol (Calc): 84 mg/dL (calc) (ref <130)
Total CHOL/HDL Ratio: 3.3 (calc) (ref <5.0)
Triglycerides: 127 mg/dL (ref <150)

## 2020-08-14 LAB — HEMOGLOBIN A1C
Hgb A1c MFr Bld: 5.5 % of total Hgb (ref <5.7)
Mean Plasma Glucose: 111 mg/dL
eAG (mmol/L): 6.2 mmol/L

## 2020-08-15 NOTE — Progress Notes (Signed)
  This encounter was created in error - please disregard.left without being seen.rescheduled appointment.

## 2020-08-30 ENCOUNTER — Encounter: Payer: Self-pay | Admitting: Internal Medicine

## 2020-10-21 ENCOUNTER — Other Ambulatory Visit: Payer: Self-pay

## 2020-10-21 DIAGNOSIS — F331 Major depressive disorder, recurrent, moderate: Secondary | ICD-10-CM

## 2020-10-21 DIAGNOSIS — F5105 Insomnia due to other mental disorder: Secondary | ICD-10-CM

## 2020-10-21 MED ORDER — TRAZODONE HCL 100 MG PO TABS: 100.0000 mg | ORAL_TABLET | Freq: Every day | ORAL | 1 refills | Status: DC

## 2020-10-24 ENCOUNTER — Other Ambulatory Visit: Payer: Self-pay | Admitting: Orthopedic Surgery

## 2020-10-24 DIAGNOSIS — F331 Major depressive disorder, recurrent, moderate: Secondary | ICD-10-CM

## 2020-10-24 DIAGNOSIS — F5105 Insomnia due to other mental disorder: Secondary | ICD-10-CM

## 2020-10-27 ENCOUNTER — Ambulatory Visit
Admission: RE | Admit: 2020-10-27 | Discharge: 2020-10-27 | Disposition: A | Discharge status: Home / Self Care | Payer: Medicare PPO | Source: Ambulatory Visit | Attending: Family | Admitting: Family

## 2020-10-27 ENCOUNTER — Other Ambulatory Visit: Payer: Self-pay

## 2020-10-27 ENCOUNTER — Ambulatory Visit (INDEPENDENT_AMBULATORY_CARE_PROVIDER_SITE_OTHER): Payer: Medicare PPO | Admitting: Family

## 2020-10-27 ENCOUNTER — Encounter: Payer: Self-pay | Admitting: Family

## 2020-10-27 VITALS — BP 152/88 | HR 92 | Temp 97.1°F | Ht 70.0 in | Wt 210.8 lb

## 2020-10-27 DIAGNOSIS — W540XXA Bitten by dog, initial encounter: Secondary | ICD-10-CM

## 2020-10-27 DIAGNOSIS — M25512 Pain in left shoulder: Secondary | ICD-10-CM

## 2020-10-27 DIAGNOSIS — G8929 Other chronic pain: Secondary | ICD-10-CM

## 2020-10-27 MED ORDER — CIPROFLOXACIN HCL 500 MG PO TABS: 500.0000 mg | ORAL_TABLET | Freq: Two times a day (BID) | ORAL | 0 refills | Status: DC

## 2020-10-27 MED ORDER — SACCHAROMYCES BOULARDII 250 MG PO CAPS: 250.0000 mg | ORAL_CAPSULE | Freq: Two times a day (BID) | ORAL | 0 refills | Status: DC

## 2020-10-27 NOTE — Progress Notes (Signed)
Provider: Lenin Kuhnle FNP-C  Yvonna Alanis, NP  Patient Care Team: Yvonna Alanis, NP as PCP - General (Adult Health Nurse Practitioner) Katy Apo, MD as Consulting Physician (Ophthalmology)  Extended Emergency Contact Information Primary Emergency Contact: [place] Address: [address]          [place], [place]  [address] [place] Phone: [phone] Relation: Spouse  Code Status:  Full Code  Goals of care: Advanced Directive information Advanced Directives 08/13/2020  Does Patient Have a Medical Advance Directive? No  Would patient like information on creating a medical advance directive? No - Patient declined     Chief Complaint  Patient presents with  . Acute Visit    Complains of Dog Bite Right hand, happened yesterday 4/19.    HPI:  Pt is a 75 y.o. male seen today for an acute visit for evaluation of right hand wound.Not sure whether the dog bite him or scratched him yesterday afternoon.He tried to pick it up to put in the cage.Had skin tear was able to put flap back.Hand hurts.No fever,chills or drainage.   Also complains of worsening left shoulder pain worsen when raising hand up.Has not taken anything for pain.     Past Medical History:  Diagnosis Date  . Allergic rhinitis due to pollen   . Anxiety state, unspecified   . Asymptomatic varicose veins   . Atherosclerosis of native arteries of the extremities, unspecified   . Cataract 10/17/2016   L Eye  . Contact dermatitis and other eczema due to other chemical products   . 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 Flatulence, eructation, and gas pain   . Headache(784.0)   . Hemorrhoids, external   . Herpes zoster with unspecified complication   . Hyperpotassemia   . Insomnia, unspecified   . Lumbago   . Lumbago   . Myalgia and myositis, unspecified   . Nocturia   . Obesity, unspecified   . Osteoarthrosis, unspecified whether generalized or localized,  unspecified site   . Other abnormal blood chemistry   . Other headache syndromes(339.89)   . Pure hyperglyceridemia   . Rash and other nonspecific skin eruption   . Rotator cuff (capsule) sprain   . Routine general medical examination at a health care facility   . Tobacco use disorder   . Unspecified essential hypertension   . Unspecified hypertrophic and atrophic condition of skin   . Unspecified sleep apnea    Past Surgical History:  Procedure Laterality Date  . HEMORRHOID SURGERY    . VARICOSE VEIN SURGERY      Allergies  Allergen Reactions  . Celebrex [Celecoxib]   . Erythromycin   . Oxycontin [Oxycodone Hcl]   . Penicillins   . Tetracyclines & Related     Outpatient Encounter Medications as of 10/27/2020  Medication Sig  . loratadine (CLARITIN) 10 MG tablet TAKE 1 TABLET BY MOUTH DAILY AS NEEDED FOR ALLERGIES  . losartan (COZAAR) 50 MG tablet TAKE 1 TABLET(50 MG) BY MOUTH DAILY FOR BLOOD PRESSURE  . Multiple Vitamin (MULTIVITAMIN PO) Take 1 tablet by mouth daily.  . simvastatin (ZOCOR) 20 MG tablet Take 1 tablet (20 mg total) by mouth daily at 6 PM.  . traZODone (DESYREL) 100 MG tablet Take 1 tablet (100 mg total) by mouth at bedtime. For sleep and depression   No facility-administered encounter medications on file as of 10/27/2020.    Review of Systems  Constitutional: Negative for appetite change, chills,  fatigue and fever.  Respiratory: Negative for cough, chest tightness, shortness of breath and wheezing.   Gastrointestinal: Negative for abdominal distention, abdominal pain, diarrhea, nausea and vomiting.  Musculoskeletal: Positive for arthralgias. Negative for gait problem, joint swelling and myalgias.       Left shoulder pain   Skin: Positive for wound. Negative for color change, pallor and rash.       Right hand ? Dog bite / scratch   Neurological: Negative for dizziness, speech difficulty, weakness, light-headedness and headaches.    Immunization History   Administered Date(s) Administered  . Fluad Quad(high Dose 65+) 03/31/2019, 04/05/2020  . Influenza, High Dose Seasonal PF 04/03/2017, 05/02/2017, 03/28/2018, 05/07/2018, 05/08/2019, 05/10/2020  . Influenza,inj,Quad PF,6+ Mos 06/13/2013, 04/10/2014, 05/06/2015, 05/12/2016  . Influenza-Unspecified 04/09/2010, 05/09/2012  . PFIZER(Purple Top)SARS-COV-2 Vaccination 09/07/2019, 10/07/2019, 05/06/2020  . Pneumococcal Conjugate-13 04/16/2014  . Pneumococcal Polysaccharide-23 01/26/2011, 11/25/2015, 05/02/2017, 05/07/2018, 05/08/2019, 05/10/2020  . Td 07/10/2000, 06/13/2013  . Zoster 08/05/2012  . Zoster Recombinat (Shingrix) 11/20/2016, 03/28/2017   Pertinent  Health Maintenance Due  Topic Date Due  . COLONOSCOPY (Pts 45-48yrs Insurance coverage will need to be confirmed)  08/31/2020  . INFLUENZA VACCINE  02/07/2021  . PNA vac Low Risk Adult  Completed   Fall Risk  08/12/2020 04/05/2020 12/30/2019 12/01/2019 07/31/2019  Falls in the past year? 1 1 1  0 0  Number falls in past yr: 0 0 1 0 0  Injury with Fall? 0 0 0 0 0   Functional Status Survey:    Vitals:   10/27/20 1250  BP: (!) 152/88  Pulse: 92  Temp: (!) 97.1 F (36.2 C)  TempSrc: Skin  SpO2: 96%  Weight: 210 lb 12.8 oz (95.6 kg)  Height: 5\' 10"  (1.778 m)   Body mass index is 30.25 kg/m. Physical Exam Vitals reviewed.  Constitutional:      General: He is not in acute distress.    Appearance: Normal appearance. He is normal weight. He is not ill-appearing or diaphoretic.  HENT:     Mouth/Throat:     Mouth: Mucous membranes are moist.     Pharynx: Oropharynx is clear. No oropharyngeal exudate or posterior oropharyngeal erythema.  Eyes:     General: No scleral icterus.       Right eye: No discharge.        Left eye: No discharge.     Conjunctiva/sclera: Conjunctivae normal.     Pupils: Pupils are equal, round, and reactive to light.  Cardiovascular:     Rate and Rhythm: Normal rate and regular rhythm.     Pulses: Normal  pulses.     Heart sounds: Normal heart sounds. No murmur heard. No friction rub. No gallop.   Pulmonary:     Effort: Pulmonary effort is normal. No respiratory distress.     Breath sounds: Normal breath sounds. No wheezing, rhonchi or rales.  Chest:     Chest wall: No tenderness.  Abdominal:     General: Bowel sounds are normal. There is no distension.     Palpations: Abdomen is soft. There is no mass.     Tenderness: There is no abdominal tenderness. There is no right CVA tenderness, left CVA tenderness, guarding or rebound.  Musculoskeletal:        General: No swelling.     Right shoulder: Normal.     Left shoulder: No swelling, effusion, tenderness or crepitus. Normal strength. Normal pulse.     Right lower leg: No edema.     Left  lower leg: No edema.     Comments: Left shoulder pain with ROM   Skin:    General: Skin is warm and dry.     Coloration: Skin is not pale.     Comments: Right dorsal hand with 3  X 3 cm skin tear with flap on with slight erythema and tender to touch.No drainage noted.right hand laceration cleansed with saline pat,dry,triple antibiotic ointment applied to edges.Steri-strips applied to secure skin tear flap x 4 strips applied then covered with foam dressing for protection when he puts hand in pocket per pt request.   Neurological:     Mental Status: He is alert and oriented to person, place, and time.     Cranial Nerves: No cranial nerve deficit.     Sensory: No sensory deficit.     Motor: No weakness.     Coordination: Coordination normal.     Gait: Gait normal.  Psychiatric:        Mood and Affect: Mood normal.        Speech: Speech normal.        Behavior: Behavior normal.        Thought Content: Thought content normal.        Judgment: Judgment normal.     Labs reviewed: Recent Labs    12/01/19 0916 04/05/20 1144 08/13/20 0901  NA 139 139 140  K 4.1 4.8 4.5  CL 107 105 105  CO2 24 26 29   GLUCOSE 108* 105* 102*  BUN 16 15 15    CREATININE 0.71 0.74 0.68*  CALCIUM 9.5 9.6 9.3   Recent Labs    04/05/20 1144 08/13/20 0901  AST 15 15  ALT 10 10  BILITOT 0.5 0.6  PROT 7.1 6.7   Recent Labs    04/05/20 1144 08/13/20 0901  WBC 4.8 4.9  NEUTROABS 2,899 3,023  HGB 15.6 15.1  HCT 47.0 45.3  MCV 94.9 94.4  PLT 185 189   No results found for: TSH Lab Results  Component Value Date   HGBA1C 5.5 08/13/2020   Lab Results  Component Value Date   CHOL 121 08/13/2020   HDL 37 (L) 08/13/2020   LDLCALC 63 08/13/2020   TRIG 127 08/13/2020   CHOLHDL 3.3 08/13/2020    Significant Diagnostic Results in last 30 days:  No results found.  Assessment/Plan 1. Dog bite, initial encounter Afebrile.unclear if dog bite or scratched hand causing skin tear with flap in place well approximated by patient at home. Right dorsal hand with 3  X 3 cm skin tear with flap on with slight erythema and tender to touch.No drainage noted.right hand laceration cleansed with saline pat,dry,triple antibiotic ointment applied to edges.Steri-strips applied to secure skin tear flap x 4 strips applied then covered with foam dressing for protection when he puts hand in pocket per pt request. Advised to remove dressing in 3 days and leave steri-strips in place to fall off by itself.  allergic to tetracycline unable to treat with Doxycycline will treat with Cipro as below. Side effects discussed.Advised to take Cipro along with probiotics to prevent antibiotics associated diarrhea.  - ciprofloxacin (CIPRO) 500 MG tablet; Take 1 tablet (500 mg total) by mouth 2 (two) times daily.  Dispense: 20 tablet; Refill: 0 - saccharomyces boulardii (FLORASTOR) 250 MG capsule; Take 1 capsule (250 mg total) by mouth 2 (two) times daily.  Dispense: 20 capsule; Refill: 0  2. Chronic left shoulder pain Chronic.worsen with abduction of arm.No recent imaging for evaluation. - Please  get X-ray done at [place] [facility] then will call you  with results. - referral  to outpatient therapy for ROM,exercise and muscle strengthening.  - continue on Extra strength Tylenol as needed for pain  - DG Shoulder Left; Future - Ambulatory referral to Physical Medicine Rehab  Family/ staff Communication: Reviewed plan of care with patient verbalized understanding   Labs/tests ordered: - DG Shoulder Left; Future  Next Appointment: As needed if symptoms worsen or fail to improve    Sandrea Hughs, NP

## 2020-10-27 NOTE — Patient Instructions (Signed)
-   Notify provider for any redness,swelling,drainage,fever or any chills  - May take Tylenol as needed for pain    Animal Bite, Adult Animal bite wounds can be mild or serious. It is important to get medical treatment to prevent infection. Ask your doctor if you need treatment to prevent an infection that can spread from animals to humans (rabies). Follow these instructions at home: Wound care  Follow instructions from your doctor about how to take care of your wound. Make sure you: ? Wash your hands with soap and water before you change your bandage (dressing). If you cannot use soap and water, use hand sanitizer. ? Change your bandage as told by your doctor. ? Leave stitches (sutures), skin glue, or skin tape (adhesive) strips in place. They may need to stay in place for 2 weeks or longer. If tape strips get loose and curl up, you may trim the loose edges. Do not remove tape strips completely unless your doctor says it is okay.  Check your wound every day for signs of infection. Check for: ? More redness, swelling, or pain. ? More fluid or blood. ? Warmth. ? Pus or a bad smell.   Medicines  Take or apply over-the-counter and prescription medicines only as told by your doctor.  If you were prescribed an antibiotic, take or apply it as told by your doctor. Do not stop using the antibiotic even if your wound gets better. General instructions  Keep the injured area raised (elevated) above the level of your heart while you are sitting or lying down.  If directed, put ice on the injured area. ? Put ice in a plastic bag. ? Place a towel between your skin and the bag. ? Leave the ice on for 20 minutes, 2-3 times per day.  Keep all follow-up visits as told by your doctor. This is important.   Contact a doctor if:  You have more redness, swelling, or pain around your wound.  Your wound feels warm to the touch.  You have a fever or chills.  You have a general feeling of sickness  (malaise).  You feel sick to your stomach (nauseous).  You throw up (vomit).  You have pain that does not get better. Get help right away if:  You have a red streak going away from your wound.  You have any of these coming from your wound: ? Non-clear fluid. ? More blood. ? Pus or a bad smell.  You have trouble moving your injured area.  You lose feeling (have numbness) or feel tingling anywhere on your body. Summary  It is important to get the right medical treatment for animal bites. Treatment can help you to not get an infection. Ask your doctor if you need treatment to prevent an infection that can spread from animals to humans (rabies).  Check your wound every day for signs of infection, such as more redness or swelling instead of less.  If you have a red streak going away from your wound, get medical help right away. This information is not intended to replace advice given to you by your health care provider. Make sure you discuss any questions you have with your health care provider. Document Revised: 06/21/2017 Document Reviewed: 01/04/2017 Elsevier Patient Education  2021 [employer].

## 2020-11-01 ENCOUNTER — Other Ambulatory Visit: Payer: Self-pay | Admitting: Family

## 2020-11-01 DIAGNOSIS — G8929 Other chronic pain: Secondary | ICD-10-CM

## 2020-11-01 DIAGNOSIS — M25512 Pain in left shoulder: Secondary | ICD-10-CM

## 2020-11-03 ENCOUNTER — Other Ambulatory Visit: Payer: Self-pay

## 2020-11-03 ENCOUNTER — Encounter: Payer: Self-pay | Admitting: Orthopaedic Surgery

## 2020-11-03 ENCOUNTER — Ambulatory Visit (INDEPENDENT_AMBULATORY_CARE_PROVIDER_SITE_OTHER): Payer: Medicare PPO | Admitting: Orthopaedic Surgery

## 2020-11-03 DIAGNOSIS — G8929 Other chronic pain: Secondary | ICD-10-CM | POA: Diagnosis not present

## 2020-11-03 DIAGNOSIS — M25512 Pain in left shoulder: Secondary | ICD-10-CM

## 2020-11-03 NOTE — Progress Notes (Signed)
Office Visit Note   Patient: David [name].           Date of Birth: 1945/08/29           MRN: [id number] Visit Date: 11/03/2020              Requested by: Sandrea Hughs, NP [address] [place],  [place] [address] PCP: Yvonna Alanis, NP   Assessment & Plan: Visit Diagnoses:  1. Chronic left shoulder pain     Plan: Mr. Pinder has chronic left shoulder pain.  He notes some symptoms over at least 5 to 6 years but more recently has had an exacerbation without injury or trauma.  His pain is localized along the anterior and even posterior aspect of his left shoulder.  Has had some trouble sleeping and lifting his arm over his head.  He had x-rays performed through his primary care physician's office several days ago which I reviewed.  There are subchondral cysts in the humeral head consistent with some arthritic changes but the space between the humeral head and the acromion was normal as was the glenohumeral joint space.  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  He might have a rotator cuff tear.  I will order a CT arthrogram.  He is claustrophobic and does not have any means of transportation so premedicated him with Valium appears to be out of the question.    Follow-Up Instructions: Return After CT arthrogram left shoulder.   Orders:  No orders of the defined types were placed in this encounter.  No orders of the defined types were placed in this encounter.     Procedures: No procedures performed   Clinical Data: No additional findings.   Subjective: Chief Complaint  Patient presents with  . Left Shoulder - Pain  Patient presents today for left shoulder pain. He said that it has been hurting for six years. No known injury. His pain is worsening over time. His pain is located at the superior aspect of his shoulder. He said that his shoulder hurts more upon bringing his arm down from a raised position. No numbness or  tingling. Not taking anything for pain. He is right hand dominant.   HPI  Review of Systems   Objective: Vital Signs: Ht 5\' 10"  (1.778 m)   Wt 210 lb (95.3 kg)   BMI 30.13 kg/m   Physical Exam Constitutional:      Appearance: He is well-developed.  Eyes:     Pupils: Pupils are equal, round, and reactive to light.  Pulmonary:     Effort: Pulmonary effort is normal.  Skin:    General: Skin is warm and dry.  Neurological:     Mental Status: He is alert and oriented to person, place, and time.  Psychiatric:        Behavior: Behavior normal.     Ortho Exam awake alert and oriented x3.  Comfortable sitting able to place left arm fully overhead with somewhat of a circuitous arc of motion.  Abducts 90 degrees without a problem lacks probably 15 to 20 degrees of external rotation compared to the right shoulder.  Good strength.  Biceps intact.  Skin intact.  Negative empty can testing and minimally positive impingement.  Negative Speed sign.  No crepitation  Specialty Comments:  No specialty comments available.  Imaging: No results found.   PMFS History: Patient Active Problem List   Diagnosis Date Noted  .  Pain in left shoulder 11/03/2020  . History of melanoma 04/05/2020  . Left sided sciatica 12/01/2019  . Postherpetic neuralgia 11/28/2018  . Primary osteoarthritis of left knee 11/28/2018  . Need for influenza vaccination 03/28/2018  . S/P cataract extraction and insertion of intraocular lens, right 01/08/2017  . Environmental allergies 11/08/2015  . Seasonal allergies 11/08/2015  . Encounter for smoking cessation counseling 11/08/2015  . 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  . Hypertriglyceridemia 09/11/2013  . Depression 09/11/2013  . Insomnia 09/11/2013  . Generalized anxiety disorder 09/11/2013  . Dry mouth 09/11/2013  . Hyperglycemia 09/11/2013  . Mixed hyperlipidemia 10/28/2012  . Essential hypertension 10/28/2012  . Osteoarthritis of basilar joint of  thumb 10/28/2012  . Obesity, unspecified 10/28/2012   Past Medical History:  Diagnosis Date  . Allergic rhinitis due to pollen   . Anxiety state, unspecified   . Asymptomatic varicose veins   . Atherosclerosis of native arteries of the extremities, unspecified   . Cataract 10/17/2016   L Eye  . Contact dermatitis and other eczema due to other chemical products   . 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 Flatulence, eructation, and gas pain   . Headache(784.0)   . Hemorrhoids, external   . Herpes zoster with unspecified complication   . Hyperpotassemia   . Insomnia, unspecified   . Lumbago   . Lumbago   . Myalgia and myositis, unspecified   . Nocturia   . Obesity, unspecified   . Osteoarthrosis, unspecified whether generalized or localized, unspecified site   . Other abnormal blood chemistry   . Other headache syndromes(339.89)   . Pure hyperglyceridemia   . Rash and other nonspecific skin eruption   . Rotator cuff (capsule) sprain   . Routine general medical examination at a health care facility   . Tobacco use disorder   . Unspecified essential hypertension   . Unspecified hypertrophic and atrophic condition of skin   . Unspecified sleep apnea     Family History  Problem Relation Age of Onset  . Diabetes Mother   . Heart disease Mother   . Stroke Mother   . Stroke Father     Past Surgical History:  Procedure Laterality Date  . HEMORRHOID SURGERY    . VARICOSE VEIN SURGERY     Social History   Occupational History  . Not on file  Tobacco Use  . Smoking status: Former Smoker    Packs/day: 1.50    Years: 55.00    Pack years: 82.50    Types: Cigarettes    Quit date: 07/30/2015    Years since quitting: 5.2  . Smokeless tobacco: Never Used  . Tobacco comment: pt thinks increased effexor dose helped him to quit  Vaping Use  . Vaping Use: Never used  Substance and Sexual Activity  . Alcohol use: No    Alcohol/week: 0.0 standard  drinks  . Drug use: No  . Sexual activity: Not Currently

## 2020-11-12 ENCOUNTER — Other Ambulatory Visit: Payer: Self-pay

## 2020-11-12 ENCOUNTER — Telehealth: Payer: Self-pay | Admitting: Orthopaedic Surgery

## 2020-11-12 ENCOUNTER — Other Ambulatory Visit: Payer: Medicare PPO

## 2020-11-12 DIAGNOSIS — R739 Hyperglycemia, unspecified: Secondary | ICD-10-CM

## 2020-11-12 DIAGNOSIS — E781 Pure hyperglyceridemia: Secondary | ICD-10-CM

## 2020-11-12 DIAGNOSIS — I10 Essential (primary) hypertension: Secondary | ICD-10-CM

## 2020-11-12 NOTE — Telephone Encounter (Signed)
Called patient no answer and no answering machine pickup   Need MRI review appiontment

## 2020-11-13 LAB — COMPLETE METABOLIC PANEL WITH GFR
AG Ratio: 1.3 (calc) (ref 1.0–2.5)
ALT: 12 U/L (ref 9–46)
AST: 18 U/L (ref 10–35)
Albumin: 3.9 g/dL (ref 3.6–5.1)
Alkaline phosphatase (APISO): 58 U/L (ref 35–144)
BUN: 16 mg/dL (ref 7–25)
CO2: 23 mmol/L (ref 20–32)
Calcium: 9.2 mg/dL (ref 8.6–10.3)
Chloride: 105 mmol/L (ref 98–110)
Creat: 0.81 mg/dL (ref 0.70–1.18)
GFR, Est African American: 101 mL/min/{1.73_m2} (ref >=60)
GFR, Est Non African American: 87 mL/min/{1.73_m2} (ref >=60)
Globulin: 2.9 g/dL (calc) (ref 1.9–3.7)
Glucose, Bld: 96 mg/dL (ref 65–99)
Potassium: 4.6 mmol/L (ref 3.5–5.3)
Sodium: 137 mmol/L (ref 135–146)
Total Bilirubin: 0.4 mg/dL (ref 0.2–1.2)
Total Protein: 6.8 g/dL (ref 6.1–8.1)

## 2020-11-13 LAB — CBC WITH DIFFERENTIAL/PLATELET
Absolute Monocytes: 488 cells/uL (ref 200–950)
Basophils Absolute: 80 cells/uL (ref 0–200)
Basophils Relative: 1.5 %
Eosinophils Absolute: 170 cells/uL (ref 15–500)
Eosinophils Relative: 3.2 %
HCT: 44 % (ref 38.5–50.0)
Hemoglobin: 14.3 g/dL (ref 13.2–17.1)
Lymphs Abs: 1352 cells/uL (ref 850–3900)
MCH: 30.8 pg (ref 27.0–33.0)
MCHC: 32.5 g/dL (ref 32.0–36.0)
MCV: 94.6 fL (ref 80.0–100.0)
MPV: 12.6 fL — ABNORMAL HIGH (ref 7.5–12.5)
Monocytes Relative: 9.2 %
Neutro Abs: 3212 cells/uL (ref 1500–7800)
Neutrophils Relative %: 60.6 %
Platelets: 223 10*3/uL (ref 140–400)
RBC: 4.65 10*6/uL (ref 4.20–5.80)
RDW: 12.4 % (ref 11.0–15.0)
Total Lymphocyte: 25.5 %
WBC: 5.3 10*3/uL (ref 3.8–10.8)

## 2020-11-13 LAB — LIPID PANEL
Cholesterol: 116 mg/dL (ref <200)
HDL: 37 mg/dL — ABNORMAL LOW (ref >=40)
LDL Cholesterol (Calc): 58 mg/dL (calc)
Non-HDL Cholesterol (Calc): 79 mg/dL (calc) (ref <130)
Total CHOL/HDL Ratio: 3.1 (calc) (ref <5.0)
Triglycerides: 124 mg/dL (ref <150)

## 2020-11-13 LAB — HEMOGLOBIN A1C
Hgb A1c MFr Bld: 5.3 % of total Hgb (ref <5.7)
Mean Plasma Glucose: 105 mg/dL
eAG (mmol/L): 5.8 mmol/L

## 2020-11-15 ENCOUNTER — Ambulatory Visit: Payer: Medicare PPO | Admitting: Internal Medicine

## 2020-11-16 ENCOUNTER — Ambulatory Visit: Payer: Medicare PPO | Attending: Family | Admitting: Physical Therapy

## 2020-11-16 ENCOUNTER — Ambulatory Visit: Payer: Medicare PPO | Admitting: Orthopedic Surgery

## 2020-11-16 ENCOUNTER — Other Ambulatory Visit: Payer: Self-pay

## 2020-11-16 DIAGNOSIS — G8929 Other chronic pain: Secondary | ICD-10-CM | POA: Diagnosis present

## 2020-11-16 DIAGNOSIS — M6281 Muscle weakness (generalized): Secondary | ICD-10-CM | POA: Diagnosis present

## 2020-11-16 DIAGNOSIS — R252 Cramp and spasm: Secondary | ICD-10-CM | POA: Diagnosis present

## 2020-11-16 DIAGNOSIS — R293 Abnormal posture: Secondary | ICD-10-CM | POA: Insufficient documentation

## 2020-11-16 DIAGNOSIS — M25512 Pain in left shoulder: Secondary | ICD-10-CM | POA: Insufficient documentation

## 2020-11-16 DIAGNOSIS — M25612 Stiffness of left shoulder, not elsewhere classified: Secondary | ICD-10-CM | POA: Insufficient documentation

## 2020-11-16 NOTE — Therapy (Addendum)
[facility] [address] [place], [place], [address] Phone: [phone]   Fax:  [phone]  Physical Therapy Evaluation  Patient Details  Name: David [name]. MRN: [id number] Date of Birth: 1946/02/18 Referring Provider (PT): [name]   Encounter Date: 11/16/2020   PT End of Session - 11/16/20 0854    Visit Number 1    Number of Visits 13    Date for PT Re-Evaluation 12/28/20    Authorization Type [employer] M[employer]/ [employer]    PT Start Time 0845    PT Stop Time 0930    PT Time Calculation (min) 45 min    Activity Tolerance Patient limited by pain    Behavior During Therapy [facility] for tasks assessed/performed           Past Medical History:  Diagnosis Date  . Allergic rhinitis due to pollen   . Anxiety state, unspecified   . Asymptomatic varicose veins   . Atherosclerosis of native arteries of the extremities, unspecified   . Cataract 10/17/2016   L Eye  . Contact dermatitis and other eczema due to other chemical products   . 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 Flatulence, eructation, and gas pain   . Headache(784.0)   . Hemorrhoids, external   . Herpes zoster with unspecified complication   . Hyperpotassemia   . Insomnia, unspecified   . Lumbago   . Lumbago   . Myalgia and myositis, unspecified   . Nocturia   . Obesity, unspecified   . Osteoarthrosis, unspecified whether generalized or localized, unspecified site   . Other abnormal blood chemistry   . Other headache syndromes(339.89)   . Pure hyperglyceridemia   . Rash and other nonspecific skin eruption   . Rotator cuff (capsule) sprain   . Routine general medical examination at a health care facility   . Tobacco use disorder   . Unspecified essential hypertension   . Unspecified hypertrophic and atrophic condition of skin   . Unspecified sleep apnea     Past Surgical History:  Procedure Laterality Date  .  HEMORRHOID SURGERY    . VARICOSE VEIN SURGERY      There were no vitals filed for this visit.    Subjective Assessment - 11/16/20 0850    Subjective My x ray said I have a touch of OA and I am clautrophobic and I cant be in  MRI.  I have problems with bringing my arm down after I am bringing my arm down.  I also have pain when I get up in the morning.  Sometimes it throbs.  I was here about 3 years ago and I had TPDN and it helped me.   My left shld has been hurting for about 6 months.  my left upper neck and front of my shld.    Pertinent History OA, hx of melanoma, anxiety, obesity , HTN   Hyperlipidema, post herpetic neuralgia    Limitations Lifting   bringing arm down and after I get up from sleep   Diagnostic tests x ray, scheduled for arthrogram    Patient Stated Goals I want my arm to stop hurting    Currently in Pain? Yes    Pain Score 2    at worst it is 7 /10   Pain Location Shoulder    Pain Orientation Left    Pain Descriptors / Indicators Aching;Sharp    Pain Type Chronic pain    Pain  Onset More than a month ago    Pain Frequency Intermittent    Aggravating Factors  after I raise my arms behind head and then I hurt bringing it down  mostly in the morning              [facility] PT Assessment - 11/16/20 0001      Assessment   Medical Diagnosis chronic left shoulder pain    Referring Provider (PT) whitfield, [name]    Onset Date/Surgical Date 05/11/20   6 months ago   Hand Dominance Right    Prior Therapy yes for shoulders 3 years ago with TPDN      Precautions   Precautions None      Restrictions   Weight Bearing Restrictions No      Balance Screen   Has the patient fallen in the past 6 months No    Has the patient had a decrease in activity level because of a fear of falling?  No    Is the patient reluctant to leave their home because of a fear of falling?  No      Home Environment   Living Environment Private residence    Living Arrangements  Spouse/significant other    Type of [place] to enter    Entrance Stairs-Number of Steps 3    [place] One level      Prior Function   Level of [place] Retired    Leisure I used to walk 1-2 miles a day and mow the yard      Cognition   Overall Cognitive Status Within Functional Limits for tasks assessed      Observation/Other Assessments   Focus on Therapeutic Outcomes (FOTO)  FOTO  intake 74%   predicted 72%  ( new goal 78%)      Sensation   Light Touch Appears Intact      Posture/Postural Control   Posture/Postural Control Postural limitations    Postural Limitations Rounded Shoulders;Forward head    Posture Comments increased abdominal girth      ROM / Strength   AROM / PROM / Strength AROM;Strength      AROM   AROM Assessment Site Shoulder    Right Shoulder Flexion 165 Degrees    Right Shoulder ABduction 163 Degrees    Right Shoulder Internal Rotation 65 Degrees    Right Shoulder External Rotation 90 Degrees    Left Shoulder Flexion 155 Degrees    Left Shoulder ABduction 154 Degrees    Left Shoulder Internal Rotation 80 Degrees    Left Shoulder External Rotation 65 Degrees      Strength   Overall Strength Deficits    Right Shoulder Flexion 4+/5    Right Shoulder Extension 4+/5    Right Shoulder ABduction 4+/5    Right Shoulder Internal Rotation 5/5    Right Shoulder External Rotation 5/5    Left Shoulder Flexion 4/5    Left Shoulder Extension 4+/5    Left Shoulder ABduction 4/5   pain   Left Shoulder Internal Rotation 4/5    Left Shoulder External Rotation 4-/5   pain     Palpation   Palpation comment TTP over anterior shld over supraspinatus/ infraspinatus of Left shld.  upper traps very tight and spasming                      Objective  measurements completed on examination: See above findings.       [place] Adult PT Treatment/Exercise - 11/16/20 0001       Self-Care   Self-Care Posture    Posture initial sitting and standing posture                    PT Short Term Goals - 11/16/20 1212      PT SHORT TERM GOAL #1   Title STG=LTG             PT Long Term Goals - 11/16/20 0931      PT LONG TERM GOAL #1   Title Pt will be independent with advanced HEP    Baseline no knowledge    Time 6    Period Weeks    Status New    Target Date 12/28/20      PT LONG TERM GOAL #2   Title Pt pain level 2/10 with functional activities    Baseline 7/10 with any functional activity    Time 6    Period Weeks    Status New    Target Date 12/28/20      PT LONG TERM GOAL #3   Title improve L shoulder flexion and abduction by 10 degrees each for improved function and more bil symmetry    Baseline Left shld flex 155, abd 155  R shld flex 165, abd 164,   limted left ER to 65 degrees    Time 6    Period Weeks    Status New    Target Date 12/28/20      PT LONG TERM GOAL #4   Title Pt will be able to bring 10 lb object from shelf to counter with pain level 2/10 or less    Baseline Pt with pain bringing arm from overhead shelf to counter without any weight  6/10    Time 6    Period Weeks    Status New    Target Date 12/28/20      PT LONG TERM GOAL #5   Title FOTO will improve from 74%    to 80%    indicating improved functional mobility .    Baseline eval 74%    Time 6    Period Weeks    Status New    Target Date 12/28/20                  Plan - 11/16/20 1237    Clinical Impression Statement Pt presents with signs and symptoms compatible with OA and possible left  RTC tear.  Pt scheduled for arthrogram on 11-19-20.  p  Pt presents with impairments including pain, artially limited ROM, weakness, and as a result, pt is limited with ADLs and functional activities. Pt would benefit from skilled outpatient PT services for 2 times a week for 6 weeks to progress toward pain-free PLOF.    Personal Factors and Comorbidities  Age;Comorbidity 1;Comorbidity 2    Comorbidities OA, hx of melanoma, anxiety, obesity , HTN   Hyperlipidema, post herpetic neuralgia    Examination-Activity Limitations Lift;Sleep;Carry    Examination-Participation Restrictions Yard Work    Stability/Clinical Decision Making Evolving/Moderate complexity    Clinical Decision Making Moderate    Rehab Potential Good    PT Frequency 2x / week    PT Duration 6 weeks    PT Treatment/Interventions ADLs/Self Care Home Management;Cryotherapy;Electrical Stimulation;Iontophoresis 4mg /ml Dexamethasone;Moist Heat;Therapeutic exercise;Therapeutic activities;Functional mobility training;Neuromuscular re-education;Patient/family education;Passive range of motion;Manual techniques;Dry needling;Taping;Joint Manipulations  PT Next Visit Plan review HEP,  see arthrogram results, manual    PT Home Exercise Plan [email or web address]           Patient will benefit from skilled therapeutic intervention in order to improve the following deficits and impairments:     Visit Diagnosis: Chronic left shoulder pain  Muscle weakness (generalized)  Stiffness of left shoulder, not elsewhere classified  Cramp and spasm  Abnormal posture   Access Code: [email or web address]: [email or web address]: 05/10/2022Prepared by: Donnetta Simpers BeardsleyExercises  Shoulder External Rotation and Scapular Retraction with Resistance - 1-2 x daily - 7 x weekly - 3 sets - 10 reps - 3-5 sec hold  Scapular Retraction with Resistance - 1-2 x daily - 7 x weekly - 3 sets - 10 reps  Scapular Retraction with Resistance Advanced - 1-2 x daily - 7 x weekly - 3 sets - 10 reps  Seated Gentle Upper Trapezius Stretch - 2 x daily - 7 x weekly - 1 sets - 3 reps - 20-20 sec hold  Gentle Levator Scapulae Stretch - 2 x daily - 7 x weekly - 1 sets - 3 reps - 20-30 hold    Problem List Patient Active Problem List   Diagnosis Date Noted  . Pain in left shoulder 11/03/2020  . History of melanoma  04/05/2020  . Left sided sciatica 12/01/2019  . Postherpetic neuralgia 11/28/2018  . Primary osteoarthritis of left knee 11/28/2018  . Need for influenza vaccination 03/28/2018  . S/P cataract extraction and insertion of intraocular lens, right 01/08/2017  . Environmental allergies 11/08/2015  . Seasonal allergies 11/08/2015  . Encounter for smoking cessation counseling 11/08/2015  . 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  . Hypertriglyceridemia 09/11/2013  . Depression 09/11/2013  . Insomnia 09/11/2013  . Generalized anxiety disorder 09/11/2013  . Dry mouth 09/11/2013  . Hyperglycemia 09/11/2013  . Mixed hyperlipidemia 10/28/2012  . Essential hypertension 10/28/2012  . 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 Obesity, unspecified 10/28/2012   Voncille Lo, PT, [place] Certified Exercise Expert for the Aging Adult  11/16/20 12:52 PM Phone: [phone] Fax: [facility] [address] [place], [place], [address] Phone: [phone]   Fax:  [phone]  Name: David [name]. MRN: [id number] Date of Birth: 07/23/1945  Referring diagnosis? Chronic Left shoulder pain M25.512,G89.29 (ICD-10-CM) - Chronic left shoulder pain Treatment diagnosis? (if different than referring diagnosis)  What was this (referring dx) caused by? []  Surgery []  Fall []  Ongoing issue [x]  Arthritis [x]  Other: possible RTC____________  Laterality: []  Rt [x]  Lt []  Both  Check all possible CPT codes:      []  97110 (Therapeutic Exercise)  []  92507 (SLP Treatment)  []  97112 (Neuro Re-ed)   []  92526 (Swallowing Treatment)   []  97116 (Gait Training)   []  [id number] (Cognitive Training, 1st 15 minutes) []  97140 (Manual Therapy)   []  97130 (Cognitive Training, each add'l 15 minutes)  []  97530 (Therapeutic Activities)  []  Other, List CPT Code ____________    []  32440 (Self Care)       [x]  All codes above (97110 -  97535)  [x]  97012 (Mechanical Traction)  [x]  97014 (E-stim Unattended)  []  97032 (E-stim manual)  []  97033 (Ionto)  [x]  97035 (Ultrasound)  []  97760 (Orthotic Fit) []  97750 (Physical Performance Training) []  [id number] (Aquatic Therapy) []  97034 (Contrast Bath) []  [id number] (Paraffin) []  97597 (Wound Care 1st 20 sq cm) []  97598 (Wound Care each add'l 20 sq cm) []  97016 (Vasopneumatic Device) []   [id number] Public affairs consultant) []  (Prosthetic Training)

## 2020-11-16 NOTE — Patient Instructions (Addendum)
Posture Tips DO: - stand tall and erect - keep chin tucked in - keep head and shoulders in alignment - check posture regularly in mirror or large window - pull head back against headrest in car seat;  Change your position often.  Sit with lumbar support. DON'T: - slouch or slump while watching TV or reading - sit, stand or lie in one position  for too long;  Sitting is especially hard on the spine so if you sit at a desk/use the computer, then stand up often!   Copyright  VHI. All rights reserved.  Posture - Standing   Good posture is important. Avoid slouching and forward head thrust. Maintain curve in low back and align ears over shoul- ders, hips over ankles.  Pull your belly button in toward your back bone. Ribs lifted up and chin down.  Pretend you have a golden thread from your sternum to the sky. Even weight in your  Copyright  VHI. All rights reserved.  Posture - Sitting   Sit upright, head facing forward. Try using a roll to support lower back. Keep shoulders relaxed, and avoid rounded back. Keep hips level with knees. Avoid crossing legs for long periods. dont cross legs,  Sit on sit bones and not tail bones.    Keep eyes right with the  [place] without crunching your neck bones   Copyright  VHI. All rights reserved.   Voncille Lo, PT, [place] Certified Exercise Expert for the Aging Adult  11/16/20 9:30 AM Phone: [phone] Fax: [phone]

## 2020-11-18 ENCOUNTER — Encounter: Payer: Self-pay | Admitting: Orthopedic Surgery

## 2020-11-18 ENCOUNTER — Other Ambulatory Visit: Payer: Self-pay

## 2020-11-18 ENCOUNTER — Ambulatory Visit (INDEPENDENT_AMBULATORY_CARE_PROVIDER_SITE_OTHER): Payer: Medicare PPO | Admitting: Orthopedic Surgery

## 2020-11-18 VITALS — BP 100/60 | HR 85 | Temp 97.5°F | Resp 16 | Ht 70.0 in | Wt 210.4 lb

## 2020-11-18 DIAGNOSIS — Z9109 Other allergy status, other than to drugs and biological substances: Secondary | ICD-10-CM

## 2020-11-18 DIAGNOSIS — I10 Essential (primary) hypertension: Secondary | ICD-10-CM | POA: Diagnosis not present

## 2020-11-18 DIAGNOSIS — R739 Hyperglycemia, unspecified: Secondary | ICD-10-CM

## 2020-11-18 DIAGNOSIS — G8929 Other chronic pain: Secondary | ICD-10-CM

## 2020-11-18 DIAGNOSIS — E781 Pure hyperglyceridemia: Secondary | ICD-10-CM | POA: Diagnosis not present

## 2020-11-18 DIAGNOSIS — F5105 Insomnia due to other mental disorder: Secondary | ICD-10-CM

## 2020-11-18 DIAGNOSIS — F99 Mental disorder, not otherwise specified: Secondary | ICD-10-CM

## 2020-11-18 DIAGNOSIS — M25512 Pain in left shoulder: Secondary | ICD-10-CM | POA: Diagnosis not present

## 2020-11-18 DIAGNOSIS — Z1211 Encounter for screening for malignant neoplasm of colon: Secondary | ICD-10-CM

## 2020-11-18 DIAGNOSIS — E663 Overweight: Secondary | ICD-10-CM

## 2020-11-18 DIAGNOSIS — F331 Major depressive disorder, recurrent, moderate: Secondary | ICD-10-CM

## 2020-11-18 MED ORDER — TRAZODONE HCL 100 MG PO TABS: 150.0000 mg | ORAL_TABLET | Freq: Every day | ORAL | 0 refills | Status: DC

## 2020-11-18 MED ORDER — LORATADINE 10 MG PO TABS: ORAL_TABLET | ORAL | 3 refills | Status: DC

## 2020-11-18 NOTE — Patient Instructions (Signed)
Please schedule colonoscopy   Preventive Care 65 Years and Older, Male Preventive care refers to lifestyle choices and visits with your health care provider that can promote health and wellness. This includes:  A yearly physical exam. This is also called an annual wellness visit.  Regular dental and eye exams.  Immunizations.  Screening for certain conditions.  Healthy lifestyle choices, such as: ? Eating a healthy diet. ? Getting regular exercise. ? Not using drugs or products that contain nicotine and tobacco. ? Limiting alcohol use. What can I expect for my preventive care visit? Physical exam Your health care provider will check your:  Height and weight. These may be used to calculate your BMI (body mass index). BMI is a measurement that tells if you are at a healthy weight.  Heart rate and blood pressure.  Body temperature.  Skin for abnormal spots. Counseling Your health care provider may ask you questions about your:  Past medical problems.  Family's medical history.  Alcohol, tobacco, and drug use.  Emotional well-being.  Home life and relationship well-being.  Sexual activity.  Diet, exercise, and sleep habits.  History of falls.  Memory and ability to understand (cognition).  Work and work Statistician.  Access to firearms. What immunizations do I need? Vaccines are usually given at various ages, according to a schedule. Your health care provider will recommend vaccines for you based on your age, medical history, and lifestyle or other factors, such as travel or where you work.   What tests do I need? Blood tests  Lipid and cholesterol levels. These may be checked every 5 years, or more often depending on your overall health.  Hepatitis C test.  Hepatitis B test. Screening  Lung cancer screening. You may have this screening every year starting at age 47 if you have a 30-pack-year history of smoking and currently smoke or have quit within the  past 15 years.  Colorectal cancer screening. ? All adults should have this screening starting at age 50 and continuing until age 60. ? Your health care provider may recommend screening at age 78 if you are at increased risk. ? You will have tests every 1-10 years, depending on your results and the type of screening test.  Prostate cancer screening. Recommendations will vary depending on your family history and other risks.  Genital exam to check for testicular cancer or hernias.  Diabetes screening. ? This is done by checking your blood sugar (glucose) after you have not eaten for a while (fasting). ? You may have this done every 1-3 years.  Abdominal aortic aneurysm (AAA) screening. You may need this if you are a current or former smoker.  STD (sexually transmitted disease) testing, if you are at risk. Follow these instructions at home: Eating and drinking  Eat a diet that includes fresh fruits and vegetables, whole grains, lean protein, and low-fat dairy products. Limit your intake of foods with high amounts of sugar, saturated fats, and salt.  Take vitamin and mineral supplements as recommended by your health care provider.  Do not drink alcohol if your health care provider tells you not to drink.  If you drink alcohol: ? Limit how much you have to 0-2 drinks a day. ? Be aware of how much alcohol is in your drink. In the [place] one drink equals one 12 oz bottle of beer (355 mL), one 5 oz glass of wine (148 mL), or one 1 oz glass of hard liquor (44 mL).   Lifestyle  Take  daily care of your teeth and gums. Brush your teeth every morning and night with fluoride toothpaste. Floss one time each day.  Stay active. Exercise for at least 30 minutes 5 or more days each week.  Do not use any products that contain nicotine or tobacco, such as cigarettes, e-cigarettes, and chewing tobacco. If you need help quitting, ask your health care provider.  Do not use drugs.  If you are sexually  active, practice safe sex. Use a condom or other form of protection to prevent STIs (sexually transmitted infections).  Talk with your health care provider about taking a low-dose aspirin or statin.  Find healthy ways to cope with stress, such as: ? Meditation, yoga, or listening to music. ? Journaling. ? Talking to a trusted person. ? Spending time with friends and family. Safety  Always wear your seat belt while driving or riding in a vehicle.  Do not drive: ? If you have been drinking alcohol. Do not ride with someone who has been drinking. ? When you are tired or distracted. ? While texting.  Wear a helmet and other protective equipment during sports activities.  If you have firearms in your house, make sure you follow all gun safety procedures. What's next?  Visit your health care provider once a year for an annual wellness visit.  Ask your health care provider how often you should have your eyes and teeth checked.  Stay up to date on all vaccines. This information is not intended to replace advice given to you by your health care provider. Make sure you discuss any questions you have with your health care provider. Document Revised: 03/25/2019 Document Reviewed: 06/20/2018 Elsevier Patient Education  2021 [employer].

## 2020-11-18 NOTE — Progress Notes (Signed)
Careteam: Patient Care Team: Yvonna Alanis, NP as PCP - General (Adult Health Nurse Practitioner) Katy Apo, MD as Consulting Physician (Ophthalmology)  Seen by: Windell Moulding, AGNP-C  PLACE OF SERVICE:  [place] Directive information Does Patient Have a Medical Advance Directive?: No, Would patient like information on creating a medical advance directive?: No - Patient declined  Allergies  Allergen Reactions  . Celebrex [Celecoxib]   . Erythromycin   . Oxycontin [Oxycodone Hcl]   . Penicillins   . Tetracyclines & Related     Chief Complaint  Patient presents with  . Medical Management of Chronic Issues    3 month follow up  . Health Maintenance    Discuss the need for Colonoscopy.   . Immunizations    Discuss the need for 2nd Covid Booster.     HPI: Patient is a 75 y.o. male seen today for medical management of chronic conditions.   Labs reviewed with patient.   Allergies have been worse due to spring weather. He c/o sneezing and watery eyes. Continues to take Claritin daily.   Chronic left shoulder pain ongoing. Shoulder pain is worse when he raises his arm. He does not take any medications for the pain. Recently seen by Dr. Durward Fortes. He plans to have a CT left shoulder and aspiration with injection tomorrow. MRI suggested, but he declined to have the test done due to claustrophobia. He does not wish to have surgery only outpatient measures.  He recently started PT this week, he is learning stretching exercises at this time.   Does not take blood pressure at home. Takes losartan daily. Does not follow low sodium diet. Denies chest pain or sob.   Sleeping patterns still vary. Still taking trazodone daily. It helps him fall asleep.   Depression ongoing. Believes it is related to his wife's health. She continues to have short term memory issues. His wife continues to be very independent with cooking foods and medication administration. He reports she will  not let him help her. She has often taken her medications at the wrong time. In addition, she has had some behavioral outbursts and yelled at him. No wandering, but he is sad and stressed with the changes in his wife's condition. He also reports being very frustrated at repeating himself all day long, due to memory and hearing issues.   Dog bite to hand has healed, no further issues.   He received a mail reminder for follow up colonoscopy from [facility]. Last study ten years ago. Benefits versus risks discussed. He would like to proceed with new referral to have study done.      Review of Systems:  Review of Systems  Constitutional: Negative for chills, fever, malaise/fatigue and weight loss.  HENT: Positive for congestion. Negative for hearing loss and sore throat.        Seasonal allergies  Eyes: Negative for blurred vision.       Glasses, watery eyes  Respiratory: Negative for cough, shortness of breath and wheezing.   Cardiovascular: Negative for chest pain and leg swelling.  Gastrointestinal: Negative for abdominal pain, blood in stool, constipation, diarrhea, heartburn, nausea and vomiting.  Genitourinary: Negative for dysuria and frequency.  Musculoskeletal: Positive for myalgias. Negative for falls.       Left shoulder pain  Skin: Negative.        Healed dog bite  Neurological: Negative for dizziness, seizures and headaches.  Endo/Heme/Allergies: Positive for environmental allergies.  Psychiatric/Behavioral: Positive for  depression. Negative for suicidal ideas. The patient has insomnia. The patient is not nervous/anxious.     Past Medical History:  Diagnosis Date  . Allergic rhinitis due to pollen   . Anxiety state, unspecified   . Asymptomatic varicose veins   . Atherosclerosis of native arteries of the extremities, unspecified   . Cataract 10/17/2016   L Eye  . Contact dermatitis and other eczema due to other chemical products   . 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 Flatulence, eructation, and gas pain   . Headache(784.0)   . Hemorrhoids, external   . Herpes zoster with unspecified complication   . Hyperpotassemia   . Insomnia, unspecified   . Lumbago   . Lumbago   . Myalgia and myositis, unspecified   . Nocturia   . Obesity, unspecified   . Osteoarthrosis, unspecified whether generalized or localized, unspecified site   . Other abnormal blood chemistry   . Other headache syndromes(339.89)   . Pure hyperglyceridemia   . Rash and other nonspecific skin eruption   . Rotator cuff (capsule) sprain   . Routine general medical examination at a health care facility   . Tobacco use disorder   . Unspecified essential hypertension   . Unspecified hypertrophic and atrophic condition of skin   . Unspecified sleep apnea    Past Surgical History:  Procedure Laterality Date  . HEMORRHOID SURGERY    . VARICOSE VEIN SURGERY     Social History:   reports that he quit smoking about 5 years ago. His smoking use included cigarettes. He has a 82.50 pack-year smoking history. He has never used smokeless tobacco. He reports that he does not drink alcohol and does not use drugs.  Family History  Problem Relation Age of Onset  . Diabetes Mother   . Heart disease Mother   . Stroke Mother   . Stroke Father     Medications: Patient's Medications  New Prescriptions   No medications on file  Previous Medications   LORATADINE (CLARITIN) 10 MG TABLET    TAKE 1 TABLET BY MOUTH DAILY AS NEEDED FOR ALLERGIES   LOSARTAN (COZAAR) 50 MG TABLET    TAKE 1 TABLET(50 MG) BY MOUTH DAILY FOR BLOOD PRESSURE   MULTIPLE VITAMIN (MULTIVITAMIN PO)    Take 1 tablet by mouth daily.   SIMVASTATIN (ZOCOR) 20 MG TABLET    Take 1 tablet (20 mg total) by mouth daily at 6 PM.   TRAZODONE (DESYREL) 100 MG TABLET    Take 1 tablet (100 mg total) by mouth at bedtime. For sleep and depression  Modified Medications   No medications on file  Discontinued  Medications   CIPROFLOXACIN (CIPRO) 500 MG TABLET    Take 1 tablet (500 mg total) by mouth 2 (two) times daily.   SACCHAROMYCES BOULARDII (FLORASTOR) 250 MG CAPSULE    Take 1 capsule (250 mg total) by mouth 2 (two) times daily.    Physical Exam:  Vitals:   11/18/20 0846  Resp: 16  Weight: 210 lb 6.4 oz (95.4 kg)  Height: 5\' 10"  (1.778 m)   Body mass index is 30.19 kg/m. Wt Readings from Last 3 Encounters:  11/18/20 210 lb 6.4 oz (95.4 kg)  11/03/20 210 lb (95.3 kg)  10/27/20 210 lb 12.8 oz (95.6 kg)    Physical Exam Vitals reviewed.  Constitutional:      General: He is not in acute distress. HENT:     Head: Normocephalic.  Eyes:  General:        Right eye: No discharge.        Left eye: No discharge.  Neck:     Vascular: No carotid bruit.  Cardiovascular:     Rate and Rhythm: Normal rate and regular rhythm.     Pulses: Normal pulses.     Heart sounds: Normal heart sounds. No murmur heard.   Pulmonary:     Effort: Pulmonary effort is normal. No respiratory distress.     Breath sounds: Normal breath sounds. No wheezing.  Abdominal:     General: Bowel sounds are normal. There is no distension.     Palpations: Abdomen is soft.     Tenderness: There is no abdominal tenderness.  Musculoskeletal:     Cervical back: Normal range of motion.     Right lower leg: No edema.     Left lower leg: No edema.  Lymphadenopathy:     Cervical: No cervical adenopathy.  Skin:    General: Skin is warm and dry.     Capillary Refill: Capillary refill takes less than 2 seconds.  Neurological:     General: No focal deficit present.     Mental Status: He is alert and oriented to person, place, and time.     Motor: No weakness.     Gait: Gait normal.  Psychiatric:        Mood and Affect: Mood normal.        Behavior: Behavior normal.     Labs reviewed: Basic Metabolic Panel: Recent Labs    04/05/20 1144 08/13/20 0901 11/12/20 1158  NA 139 140 137  K 4.8 4.5 4.6  CL 105  105 105  CO2 26 29 23   GLUCOSE 105* 102* 96  BUN 15 15 16   CREATININE 0.74 0.68* 0.81  CALCIUM 9.6 9.3 9.2   Liver Function Tests: Recent Labs    04/05/20 1144 08/13/20 0901 11/12/20 1158  AST 15 15 18   ALT 10 10 12   BILITOT 0.5 0.6 0.4  PROT 7.1 6.7 6.8   No results for input(s): LIPASE, AMYLASE in the last 8760 hours. No results for input(s): AMMONIA in the last 8760 hours. CBC: Recent Labs    04/05/20 1144 08/13/20 0901 11/12/20 1158  WBC 4.8 4.9 5.3  NEUTROABS 2,899 3,023 3,212  HGB 15.6 15.1 14.3  HCT 47.0 45.3 44.0  MCV 94.9 94.4 94.6  PLT 185 189 223   Lipid Panel: Recent Labs    04/05/20 1144 08/13/20 0901 11/12/20 1158  CHOL 138 121 116  HDL 44 37* 37*  LDLCALC 72 63 58  TRIG 138 127 124  CHOLHDL 3.1 3.3 3.1   TSH: No results for input(s): TSH in the last 8760 hours. A1C: Lab Results  Component Value Date   HGBA1C 5.3 11/12/2020     Assessment/Plan 1. Essential hypertension - controlled - cont losartan - he likes adding salt to foods, advised to limit daily sodium to < 2000 mg/day - cbc/diff- future - cmp- future  2. Hypertriglyceridemia - triglycerides 124 11/2020 - lipid panel- future  3. Hyperglycemia - A1c 5.3 11/2020 - cont diet low in carbs and sugars - A1c- future  4. Chronic left shoulder pain - followed by Dr. 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 Overweight (BMI 25.0-29.9) - weight unchanged -  cont to limit calories daily - recommend light exercise like walking 150 min/week  6. Insomnia due to other mental disorder - falling asleep but ot always staying asleep - traZODone (DESYREL) 100 MG tablet; Take 1.5 tablets (150 mg total) by mouth at bedtime. For sleep and depression  Dispense: 90 tablet; Refill: 0  7. Moderate episode of recurrent major  depressive disorder (HCC) - related to wife memory issues - denies plan to hurt himself or others - suggest increasing trazodone for increased depression and sleeping inconsistiency - traZODone (DESYREL) 100 MG tablet; Take 1.5 tablets (150 mg total) by mouth at bedtime. For sleep and depression  Dispense: 90 tablet; Refill: 0  8. Environmental allergies - watery eyes, sneezing and nasal congestion due to spring weather - advised to change air filters and wear mask when doing yard work - may consider stopping Claritin for 2 weeks and using Zyrtec, then restarting Clairitn - loratadine (CLARITIN) 10 MG tablet; TAKE 1 TABLET BY MOUTH DAILY AS NEEDED FOR ALLERGIES  Dispense: 90 tablet; Refill: 3  9. Colon cancer screening - benefits and risks discussed with patient - he denies changes in bowel habits or history of colon cancer - would like to proceed with study one last time - Ambulatory referral to Gastroenterology  Next appt: 02/24/2021 Teonia Yager, AGNP-C  Labs/tests: cbc/diff, cmp, lipid panel in 3 months, referral to colonoscopy  Total time: 38 minutes. Greater than 50% of total time spent doing patient education and coordination of care regarding diet, exercise, depression management and healthy coping techniques.   [facility] [phone]

## 2020-11-19 ENCOUNTER — Ambulatory Visit
Admission: RE | Admit: 2020-11-19 | Discharge: 2020-11-19 | Disposition: A | Discharge status: Home / Self Care | Payer: Medicare PPO | Source: Ambulatory Visit | Attending: Orthopaedic Surgery | Admitting: Orthopaedic Surgery

## 2020-11-19 DIAGNOSIS — M25512 Pain in left shoulder: Secondary | ICD-10-CM

## 2020-11-19 MED ORDER — IOPAMIDOL (ISOVUE-M 200) INJECTION 41%
13.0000 mL | Freq: Once | INTRAMUSCULAR | Status: AC
  Administered 2020-11-19: 13 mL via INTRA_ARTICULAR

## 2020-11-25 ENCOUNTER — Ambulatory Visit: Payer: Medicare PPO | Admitting: Physical Therapy

## 2020-11-25 ENCOUNTER — Encounter: Payer: Self-pay | Admitting: Physical Therapy

## 2020-11-25 ENCOUNTER — Other Ambulatory Visit: Payer: Self-pay

## 2020-11-25 DIAGNOSIS — M6281 Muscle weakness (generalized): Secondary | ICD-10-CM

## 2020-11-25 DIAGNOSIS — M25512 Pain in left shoulder: Secondary | ICD-10-CM

## 2020-11-25 DIAGNOSIS — R293 Abnormal posture: Secondary | ICD-10-CM

## 2020-11-25 DIAGNOSIS — M25612 Stiffness of left shoulder, not elsewhere classified: Secondary | ICD-10-CM

## 2020-11-25 DIAGNOSIS — G8929 Other chronic pain: Secondary | ICD-10-CM

## 2020-11-25 DIAGNOSIS — R252 Cramp and spasm: Secondary | ICD-10-CM

## 2020-11-25 NOTE — Patient Instructions (Addendum)
     Add Star pattern exercise with red t band as done in clinic!   3 sets of 5  IONTOPHORESIS PATIENT PRECAUTIONS & CONTRAINDICATIONS:  . Redness under one or both electrodes can occur.  This characterized by a uniform redness that usually disappears within 12 hours of treatment. . Small pinhead size blisters may result in response to the drug.  Contact your physician if the problem persists more than 24 hours. . On rare occasions, iontophoresis therapy can result in temporary skin reactions such as rash, inflammation, irritation or burns.  The skin reactions may be the result of individual sensitivity to the ionic solution used, the condition of the skin at the start of treatment, reaction to the materials in the electrodes, allergies or sensitivity to dexamethasone, or a poor connection between the patch and your skin.  Discontinue using iontophoresis if you have any of these reactions and report to your therapist. . Remove the Patch or electrodes if you have any undue sensation of pain or burning during the treatment and report discomfort to your therapist. . Tell your Therapist if you have had known adverse reactions to the application of electrical current. . If using the Patch, the LED light will turn off when treatment is complete and the patch can be removed.  Approximate treatment time is 1-3 hours.  Remove the patch when light goes off or after 6 hours. . The Patch can be worn during normal activity, however excessive motion where the electrodes have been placed can cause poor contact between the skin and the electrode or uneven electrical current resulting in greater risk of skin irritation. Marland Kitchen Keep out of the reach of children.   . DO NOT use if you have a cardiac pacemaker or any other electrically sensitive implanted device. . DO NOT use if you have a known sensitivity to dexamethasone. . DO NOT use during Magnetic Resonance Imaging (MRI). . DO NOT use over broken or compromised  skin (e.g. sunburn, cuts, or acne) due to the increased risk of skin reaction. . DO NOT SHAVE over the area to be treated:  To establish good contact between the Patch and the skin, excessive hair may be clipped. . DO NOT place the Patch or electrodes on or over your eyes, directly over your heart, or brain. . DO NOT reuse the Patch or electrodes as this may cause burns to occur.   Voncille Lo, PT, [place] Certified Exercise Expert for the Aging Adult  11/25/20 9:05 AM Phone: [phone] Fax: [phone]

## 2020-11-25 NOTE — Therapy (Addendum)
[facility] [address] [place], [place], [address] Phone: [phone]   Fax:  [phone]  Physical Therapy Treatment  Patient Details  Name: David [name]. MRN: [id number] Date of Birth: 1945/10/17 Referring Provider (PT): [name]   Encounter Date: 11/25/2020   PT End of Session - 11/25/20 0857    Visit Number 2    Number of Visits 13    Date for PT Re-Evaluation 12/28/20    Authorization Type [employer] MCR/ [employer]    PT Start Time 0845    PT Stop Time 0930    PT Time Calculation (min) 45 min    Activity Tolerance Patient limited by pain    Behavior During Therapy [facility] for tasks assessed/performed           Past Medical History:  Diagnosis Date  . Allergic rhinitis due to pollen   . Anxiety state, unspecified   . Asymptomatic varicose veins   . Atherosclerosis of native arteries of the extremities, unspecified   . Cataract 10/17/2016   L Eye  . Contact dermatitis and other eczema due to other chemical products   . 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 Flatulence, eructation, and gas pain   . Headache(784.0)   . Hemorrhoids, external   . Herpes zoster with unspecified complication   . Hyperpotassemia   . Insomnia, unspecified   . Lumbago   . Lumbago   . Myalgia and myositis, unspecified   . Nocturia   . Obesity, unspecified   . Osteoarthrosis, unspecified whether generalized or localized, unspecified site   . Other abnormal blood chemistry   . Other headache syndromes(339.89)   . Pure hyperglyceridemia   . Rash and other nonspecific skin eruption   . Rotator cuff (capsule) sprain   . Routine general medical examination at a health care facility   . Tobacco use disorder   . Unspecified essential hypertension   . Unspecified hypertrophic and atrophic condition of skin   . Unspecified sleep apnea     Past Surgical History:  Procedure Laterality Date  .  HEMORRHOID SURGERY    . VARICOSE VEIN SURGERY      There were no vitals filed for this visit.   Subjective Assessment - 11/25/20 0853    Subjective My allergies are really bothering me today. ( eyes are tearful) and I have a headache.  I did have the test at the doctor but they have not called.  I have been coughing and sneezing since Monday.    Pertinent History OA, hx of melanoma, anxiety, obesity , HTN   Hyperlipidema, post herpetic neuralgia    Limitations Lifting    Diagnostic tests x ray, scheduled for arthrogram    Patient Stated Goals I want my arm to stop hurting    Currently in Pain? Yes    Pain Score 5     Pain Location Shoulder    Pain Orientation Left    Pain Descriptors / Indicators Aching;Sharp    Pain Type Chronic pain    Pain Onset More than a month ago    Pain Frequency Intermittent                             [facility] Adult PT Treatment/Exercise - 11/25/20 0001      Self-Care   Self-Care Other Self-Care Comments    Posture DOMS and explanation of RTC  Shoulder Exercises: Standing   External Rotation 10 reps;Left;Strengthening;Theraband    Theraband Level (Shoulder External Rotation) Level 2 (Red)    External Rotation Weight (lbs) x2 VC and TC    Internal Rotation 10 reps;Theraband;Strengthening;Left    Theraband Level (Shoulder Internal Rotation) Level 2 (Red)    Internal Rotation Weight (lbs) x2 VC and TC with towel roll    Extension Both;10 reps    Theraband Level (Shoulder Extension) Level 2 (Red)    Extension Limitations x2    Row Both;10 reps;Theraband    Theraband Level (Shoulder Row) Level 2 (Red)    Row Limitations x2    Other Standing Exercises star pattern red t abnd 3 x 5 reps  VC And TC for posture and technique      Modalities   Modalities Iontophoresis      Iontophoresis   Type of Iontophoresis Dexamethasone    Location shoulder left anterior    Dose 1cc    Time 4-6 patch to be removed in 6 hours      Neck  Exercises: Stretches   Upper Trapezius Stretch 2 reps;Left;30 seconds    Upper Trapezius Stretch Limitations VC    Levator Stretch 2 reps;30 seconds;Left    Levator Stretch Limitations vc                  PT Education - 11/25/20 0905    Education Details Added to HEP, education on iontophoresis and when to remove patch. explanation of RTC and DOMS    Person(s) Educated Patient    Methods Explanation;Demonstration;Tactile cues;Handout;Verbal cues    Comprehension Verbalized understanding;Returned demonstration            PT Short Term Goals - 11/16/20 1212      PT SHORT TERM GOAL #1   Title STG=LTG             PT Long Term Goals - 11/16/20 0931      PT LONG TERM GOAL #1   Title Pt will be independent with advanced HEP    Baseline no knowledge    Time 6    Period Weeks    Status New    Target Date 12/28/20      PT LONG TERM GOAL #2   Title Pt pain level 2/10 with functional activities    Baseline 7/10 with any functional activity    Time 6    Period Weeks    Status New    Target Date 12/28/20      PT LONG TERM GOAL #3   Title improve L shoulder flexion and abduction by 10 degrees each for improved function and more bil symmetry    Baseline Left shld flex 155, abd 155  R shld flex 165, abd 164,   limted left ER to 65 degrees    Time 6    Period Weeks    Status New    Target Date 12/28/20      PT LONG TERM GOAL #4   Title Pt will be able to bring 10 lb object from shelf to counter with pain level 2/10 or less    Baseline Pt with pain bringing arm from overhead shelf to counter without any weight  6/10    Time 6    Period Weeks    Status New    Target Date 12/28/20      PT LONG TERM GOAL #5   Title FOTO will improve from 74%    to 80%  indicating improved functional mobility .    Baseline eval 74%    Time 6    Period Weeks    Status New    Target Date 12/28/20            Access Code: [email or web address]: [email or web address]:  05/19/2022Prepared by: Lesly Rubenstein Notes   Added Star pattern red t band exercises as shown in clinic 3 x 5 reps Exercises   Shoulder Internal Rotation with Resistance - 1 x daily - 7 x weekly - 3 sets - 10 reps  Shoulder External Rotation with Anchored Resistance with Towel Under Elbow - 1 x daily - 7 x weekly - 3 sets - 10 reps       Plan - 11/25/20 9485    Clinical Impression Statement Pt presents with tearful eyes and cough and congestion he reports since Monday.  Pt is 5/10 on Left shld and is able to perform exercises today for RTC/   Pt had imaging done but has not discussed with his MD as of yet.  Pt still with point tenderness over supraspinatus/infraspinatus attachment on shld.   Used iontophoresis patch today for pain.  suggested to Pt to see MD with his symptoms to get relief and possibly test confirm allergies/ vs /flu/COVID.  Pt was warm to touch during session.  Pt stated he was on way to MD to get relief from his symptoms.    Personal Factors and Comorbidities Age;Comorbidity 1;Comorbidity 2    Comorbidities OA, hx of melanoma, anxiety, obesity , HTN   Hyperlipidema, post herpetic neuralgia    Examination-Activity Limitations Lift;Sleep;Carry    Examination-Participation Restrictions Yard Work    PT Frequency 2x / week    PT Duration 6 weeks    PT Treatment/Interventions ADLs/Self Care Home Management;Cryotherapy;Electrical Stimulation;Iontophoresis 4mg /ml Dexamethasone;Moist Heat;Therapeutic exercise;Therapeutic activities;Functional mobility training;Neuromuscular re-education;Patient/family education;Passive range of motion;Manual techniques;Dry needling;Taping;Joint Manipulations    PT Next Visit Plan review HEP,  see arthrogram results, manual    PT Home Exercise Plan [email or web address]    Consulted and Agree with Plan of Care Patient           Plan - 11/25/20 0938    Clinical Impression Statement Pt presents with tearful eyes and cough and congestion he reports  since Monday.  Pt is 5/10 on Left shld and is able to perform exercises today for RTC/   Pt had imaging done but has not discussed with his MD as of yet.  Pt still with point tenderness over supraspinatus/infraspinatus attachment on shld.   Used iontophoresis patch today for pain.  suggested to Pt to see MD with his symptoms to get relief and possibly test confirm allergies/ vs /flu/COVID.  Pt was warm to touch during session.  Pt stated he was on way to MD to get relief from his symptoms.    Personal Factors and Comorbidities Age;Comorbidity 1;Comorbidity 2    Comorbidities OA, hx of melanoma, anxiety, obesity , HTN   Hyperlipidema, post herpetic neuralgia    Examination-Activity Limitations Lift;Sleep;Carry    Examination-Participation Restrictions Yard Work    PT Frequency 2x / week    PT Duration 6 weeks    PT Treatment/Interventions ADLs/Self Care Home Management;Cryotherapy;Electrical Stimulation;Iontophoresis 4mg /ml Dexamethasone;Moist Heat;Therapeutic exercise;Therapeutic activities;Functional mobility training;Neuromuscular re-education;Patient/family education;Passive range of motion;Manual techniques;Dry needling;Taping;Joint Manipulations    PT Next Visit Plan review HEP,  see arthrogram results, manual    PT Home Exercise Plan [email or web address]    Consulted and Agree with Plan of Care Patient  Patient will benefit from skilled therapeutic intervention in order to improve the following deficits and impairments:     Visit Diagnosis: Chronic left shoulder pain  Muscle weakness (generalized)  Stiffness of left shoulder, not elsewhere classified  Cramp and spasm  Abnormal posture     Problem List Patient Active Problem List   Diagnosis Date Noted  . Pain in left shoulder 11/03/2020  . History of melanoma 04/05/2020  . Left sided sciatica 12/01/2019  . Postherpetic neuralgia 11/28/2018  . Primary osteoarthritis of left knee 11/28/2018  . Need for influenza vaccination  03/28/2018  . S/P cataract extraction and insertion of intraocular lens, right 01/08/2017  . Environmental allergies 11/08/2015  . Seasonal allergies 11/08/2015  . Encounter for smoking cessation counseling 11/08/2015  . 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  . Hypertriglyceridemia 09/11/2013  . Depression 09/11/2013  . Insomnia 09/11/2013  . Generalized anxiety disorder 09/11/2013  . Dry mouth 09/11/2013  . Hyperglycemia 09/11/2013  . Mixed hyperlipidemia 10/28/2012  . Essential hypertension 10/28/2012  . 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 Obesity, unspecified 10/28/2012    Voncille Lo, PT, [place] Certified Exercise Expert for the Aging Adult  11/25/20 9:45 AM Phone: [phone] Fax: [place] [facility] [address] [place], [place], [address] Phone: [phone]   Fax:  [phone]  Name: Hurschel Paynter. MRN: [id number] Date of Birth: 1946/02/25

## 2020-11-26 ENCOUNTER — Encounter: Payer: Self-pay | Admitting: Orthopedic Surgery

## 2020-11-26 ENCOUNTER — Ambulatory Visit (INDEPENDENT_AMBULATORY_CARE_PROVIDER_SITE_OTHER): Payer: Medicare PPO | Admitting: Orthopedic Surgery

## 2020-11-26 ENCOUNTER — Other Ambulatory Visit: Payer: Self-pay

## 2020-11-26 VITALS — BP 140/86 | HR 76 | Temp 96.6°F | Resp 18 | Ht 70.0 in | Wt 210.0 lb

## 2020-11-26 DIAGNOSIS — Z20822 Contact with and (suspected) exposure to covid-19: Secondary | ICD-10-CM

## 2020-11-26 DIAGNOSIS — R059 Cough, unspecified: Secondary | ICD-10-CM

## 2020-11-26 MED ORDER — GUAIFENESIN 200 MG PO TABS
600.0000 mg | ORAL_TABLET | Freq: Two times a day (BID) | ORAL | 0 refills | Status: AC
Start: 2020-11-26 — End: 2020-12-03

## 2020-11-26 NOTE — Progress Notes (Signed)
Careteam: Patient Care Team: Yvonna Alanis, NP as PCP - General (Adult Health Nurse Practitioner) Katy Apo, MD as Consulting Physician (Ophthalmology)  Seen by: Windell Moulding, AGNP-C  PLACE OF SERVICE:  [place] Directive information Does Patient Have a Medical Advance Directive?: No, Would patient like information on creating a medical advance directive?: No - Patient declined  Allergies  Allergen Reactions  . Celebrex [Celecoxib]   . Erythromycin   . Oxycontin [Oxycodone Hcl]   . Penicillins   . Tetracyclines & Related     Chief Complaint  Patient presents with  . Acute Visit    Patient complains of congestion, productive cough, sneezing, and headaches for the past 5 days. Taking tylenol 500 mg for headaches with some relief. No other OTC medications. No fevers.     HPI: Patient is a 75 y.o. male seen today for acute visit for congestion, productive cough, sneezing and headaches x 5 days.   He has not been in contact with any sick persons. He has not done a covid test. Symptoms began 5 days ago with runny nose, cough and sore throat. Unsure if he had fever. Cough productive with clear sputum. Reports his symptoms have started to improve in last day. He has not tried any interventions to help relieve symptoms. Requesting covid test today. He has completed his Pfizer covid vaccine and booster.   Review of Systems:  Review of Systems  Constitutional: Negative for chills, fever, malaise/fatigue and weight loss.  HENT: Positive for congestion and sore throat.   Respiratory: Positive for cough and sputum production. Negative for shortness of breath and wheezing.   Cardiovascular: Negative for chest pain and palpitations.  Gastrointestinal: Negative for diarrhea, nausea and vomiting.  Musculoskeletal: Negative for myalgias.  Skin: Negative.   Psychiatric/Behavioral: Negative for depression. The patient is not nervous/anxious.     Past Medical History:   Diagnosis Date  . Allergic rhinitis due to pollen   . Anxiety state, unspecified   . Asymptomatic varicose veins   . Atherosclerosis of native arteries of the extremities, unspecified   . Cataract 10/17/2016   L Eye  . Contact dermatitis and other eczema due to other chemical products   . 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 Flatulence, eructation, and gas pain   . Headache(784.0)   . Hemorrhoids, external   . Herpes zoster with unspecified complication   . Hyperpotassemia   . Insomnia, unspecified   . Lumbago   . Lumbago   . Myalgia and myositis, unspecified   . Nocturia   . Obesity, unspecified   . Osteoarthrosis, unspecified whether generalized or localized, unspecified site   . Other abnormal blood chemistry   . Other headache syndromes(339.89)   . Pure hyperglyceridemia   . Rash and other nonspecific skin eruption   . Rotator cuff (capsule) sprain   . Routine general medical examination at a health care facility   . Tobacco use disorder   . Unspecified essential hypertension   . Unspecified hypertrophic and atrophic condition of skin   . Unspecified sleep apnea    Past Surgical History:  Procedure Laterality Date  . HEMORRHOID SURGERY    . VARICOSE VEIN SURGERY     Social History:   reports that he quit smoking about 5 years ago. His smoking use included cigarettes. He has a 82.50 pack-year smoking history. He has never used smokeless tobacco. He reports that he does not drink alcohol and does  not use drugs.  Family History  Problem Relation Age of Onset  . Diabetes Mother   . Heart disease Mother   . Stroke Mother   . Stroke Father     Medications: Patient's Medications  New Prescriptions   No medications on file  Previous Medications   LORATADINE (CLARITIN) 10 MG TABLET    TAKE 1 TABLET BY MOUTH DAILY AS NEEDED FOR ALLERGIES   LOSARTAN (COZAAR) 50 MG TABLET    TAKE 1 TABLET(50 MG) BY MOUTH DAILY FOR BLOOD PRESSURE    MULTIPLE VITAMIN (MULTIVITAMIN PO)    Take 1 tablet by mouth daily.   SIMVASTATIN (ZOCOR) 20 MG TABLET    Take 1 tablet (20 mg total) by mouth daily at 6 PM.   TRAZODONE (DESYREL) 100 MG TABLET    Take 1.5 tablets (150 mg total) by mouth at bedtime. For sleep and depression  Modified Medications   No medications on file  Discontinued Medications   No medications on file    Physical Exam:  Vitals:   11/26/20 0947  BP: 140/86  Pulse: 76  Resp: 18  Temp: (!) 96.6 F (35.9 C)  SpO2: 95%  Weight: 210 lb (95.3 kg)  Height: 5\' 10"  (1.778 m)   Body mass index is 30.13 kg/m. Wt Readings from Last 3 Encounters:  11/26/20 210 lb (95.3 kg)  11/18/20 210 lb 6.4 oz (95.4 kg)  11/03/20 210 lb (95.3 kg)    Physical Exam Vitals reviewed.  Constitutional:      General: He is not in acute distress. HENT:     Head: Normocephalic.     Right Ear: There is no impacted cerumen.     Left Ear: There is no impacted cerumen.     Nose: Nose normal. No congestion.     Mouth/Throat:     Mouth: Mucous membranes are moist.     Pharynx: No posterior oropharyngeal erythema.  Eyes:     General:        Right eye: Discharge present.        Left eye: No discharge.  Cardiovascular:     Rate and Rhythm: Normal rate and regular rhythm.     Pulses: Normal pulses.     Heart sounds: Normal heart sounds. No murmur heard.   Pulmonary:     Effort: Pulmonary effort is normal. No respiratory distress.     Breath sounds: Normal breath sounds. No wheezing.  Abdominal:     General: Bowel sounds are normal. There is no distension.     Palpations: Abdomen is soft.     Tenderness: There is no abdominal tenderness.  Musculoskeletal:     Cervical back: Normal range of motion.  Lymphadenopathy:     Cervical: No cervical adenopathy.  Skin:    General: Skin is warm and dry.     Capillary Refill: Capillary refill takes less than 2 seconds.  Neurological:     General: No focal deficit present.     Mental  Status: He is alert and oriented to person, place, and time.  Psychiatric:        Behavior: Behavior normal.     Labs reviewed: Basic Metabolic Panel: Recent Labs    04/05/20 1144 08/13/20 0901 11/12/20 1158  NA 139 140 137  K 4.8 4.5 4.6  CL 105 105 105  CO2 26 29 23   GLUCOSE 105* 102* 96  BUN 15 15 16   CREATININE 0.74 0.68* 0.81  CALCIUM 9.6 9.3 9.2   Liver Function  Tests: Recent Labs    04/05/20 1144 08/13/20 0901 11/12/20 1158  AST 15 15 18   ALT 10 10 12   BILITOT 0.5 0.6 0.4  PROT 7.1 6.7 6.8   No results for input(s): LIPASE, AMYLASE in the last 8760 hours. No results for input(s): AMMONIA in the last 8760 hours. CBC: Recent Labs    04/05/20 1144 08/13/20 0901 11/12/20 1158  WBC 4.8 4.9 5.3  NEUTROABS 2,899 3,023 3,212  HGB 15.6 15.1 14.3  HCT 47.0 45.3 44.0  MCV 94.9 94.4 94.6  PLT 185 189 223   Lipid Panel: Recent Labs    04/05/20 1144 08/13/20 0901 11/12/20 1158  CHOL 138 121 116  HDL 44 37* 37*  LDLCALC 72 63 58  TRIG 138 127 124  CHOLHDL 3.1 3.3 3.1   TSH: No results for input(s): TSH in the last 8760 hours. A1C: Lab Results  Component Value Date   HGBA1C 5.3 11/12/2020     Assessment/Plan 1. Cough - lung sounds clear in all lung fields, breathing unlabored - reports clear mucous production - recommend oral hydration and rest - SARS-COV-2 RNA,(COVID-19) QUAL NAAT - guaiFENesin 200 MG tablet; Take 3 tablets (600 mg total) by mouth in the morning and at bedtime for 7 days.  Dispense: 60 tablet; Refill: 0  2. Suspected COVID-19 virus infection - symptoms mild, reports symptoms have improved in last day - covid test done in office today - we signed him up for free home tests on [employer] website - advised to start vitamin C 1000 mg po bid x 7 days - advised to start vitamin D 2000 units po daily x 7 days - advised to start zinc 50 mg po daily x 7 days - advised to start aspirin 81 mg po daily x 7 days - please contact PCP if  symptoms worsen  Total time: 20 minutes. Greater than 50 % of total time spent doing patient education and coordination of care regarding covid symptoms management and precautions.   Next appt: 02/24/2021 Windell Moulding, [facility] [phone]

## 2020-11-26 NOTE — Patient Instructions (Addendum)
Please purchase Vitamin C - take 1000 mg twice daily for 7 days Please purchase vitamin D- take 2000 units once a day for 7 days Please purchase zinc- take 50 mg daily for 7 days May take one baby aspirin 81 mg daily for 7 days Mucinex prescription sent to pharmacy- please stay hydrated and rest Contact PCP if cough does not resolve or symptoms worsen   10 Things You Can Do to Manage Your COVID-19 Symptoms at Home If you have possible or confirmed COVID-19: 1. Stay home except to get medical care. 2. Monitor your symptoms carefully. If your symptoms get worse, call your healthcare provider immediately. 3. Get rest and stay hydrated. 4. If you have a medical appointment, call the healthcare provider ahead of time and tell them that you have or may have COVID-19. 5. For medical emergencies, call 911 and notify the dispatch personnel that you have or may have COVID-19. 6. Cover your cough and sneezes with a tissue or use the inside of your elbow. 7. Wash your hands often with soap and water for at least 20 seconds or clean your hands with an alcohol-based hand sanitizer that contains at least 60% alcohol. 8. As much as possible, stay in a specific room and away from other people in your home. Also, you should use a separate bathroom, if available. If you need to be around other people in or outside of the home, wear a mask. 9. Avoid sharing personal items with other people in your household, like dishes, towels, and bedding. 10. Clean all surfaces that are touched often, like counters, tabletops, and doorknobs. Use household cleaning sprays or wipes according to the label instructions. [email or web address] 01/23/2020 This information is not intended to replace advice given to you by your health care provider. Make sure you discuss any questions you have with your health care provider. Document Revised: 05/10/2020 Document Reviewed: 05/10/2020 Elsevier Patient Education  2021 [employer].

## 2020-11-27 LAB — SARS-COV-2 RNA,(COVID-19) QUALITATIVE NAAT: SARS CoV2 RNA: NOT DETECTED

## 2020-11-30 ENCOUNTER — Ambulatory Visit: Payer: Medicare PPO | Admitting: Physical Therapy

## 2020-11-30 ENCOUNTER — Encounter: Payer: Self-pay | Admitting: Physical Therapy

## 2020-11-30 ENCOUNTER — Other Ambulatory Visit: Payer: Self-pay

## 2020-11-30 DIAGNOSIS — M25612 Stiffness of left shoulder, not elsewhere classified: Secondary | ICD-10-CM

## 2020-11-30 DIAGNOSIS — G8929 Other chronic pain: Secondary | ICD-10-CM

## 2020-11-30 DIAGNOSIS — R293 Abnormal posture: Secondary | ICD-10-CM

## 2020-11-30 DIAGNOSIS — M25512 Pain in left shoulder: Secondary | ICD-10-CM

## 2020-11-30 DIAGNOSIS — M6281 Muscle weakness (generalized): Secondary | ICD-10-CM

## 2020-11-30 DIAGNOSIS — R252 Cramp and spasm: Secondary | ICD-10-CM

## 2020-11-30 NOTE — Therapy (Addendum)
[place], [place], [address] Phone: [phone]   Fax:  [phone]  Physical Therapy Treatment  Patient Details  Name: David [name]. MRN: [id number] Date of Birth: 1945/11/30 Referring Provider (PT): Joni Fears MD   Encounter Date: 11/30/2020   PT End of Session - 11/30/20 0851    Visit Number 3    Number of Visits 13    Date for PT Re-Evaluation 12/28/20    Authorization Type [employer] MCR/ [employer]    PT Start Time [phone]    PT Stop Time 0930    PT Time Calculation (min) 43 min           Past Medical History:  Diagnosis Date  . Allergic rhinitis due to pollen   . Anxiety state, unspecified   . Asymptomatic varicose veins   . Atherosclerosis of native arteries of the extremities, unspecified   . Cataract 10/17/2016   L Eye  . Contact dermatitis and other eczema due to other chemical products   . 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 Flatulence, eructation, and gas pain   . Headache(784.0)   . Hemorrhoids, external   . Herpes zoster with unspecified complication   . Hyperpotassemia   . Insomnia, unspecified   . Lumbago   . Lumbago   . Myalgia and myositis, unspecified   . Nocturia   . Obesity, unspecified   . Osteoarthrosis, unspecified whether generalized or localized, unspecified site   . Other abnormal blood chemistry   . Other headache syndromes(339.89)   . Pure hyperglyceridemia   . Rash and other nonspecific skin eruption   . Rotator cuff (capsule) sprain   . Routine general medical examination at a health care facility   . Tobacco use disorder   . Unspecified essential hypertension   . Unspecified hypertrophic and atrophic condition of skin   . Unspecified sleep apnea     Past Surgical History:  Procedure Laterality Date  . HEMORRHOID SURGERY    . VARICOSE VEIN SURGERY      There were no vitals filed for this visit.   Subjective  Assessment - 11/30/20 0850    Subjective I was tested for COVID and it was negative. I am taking some vitamins and I am much better. The rain has my arthritis flared up.    Currently in Pain? Yes    Pain Score 5     Pain Location Shoulder    Pain Orientation Left    Pain Descriptors / Indicators Aching;Sharp    Pain Type Chronic pain    Aggravating Factors  rainy weather    Pain Relieving Factors better weather              [facility] PT Assessment - 11/30/20 0001      AROM   Left Shoulder Flexion 155 Degrees    Left Shoulder ABduction 160 Degrees    Left Shoulder Internal Rotation --   between shoulder blades   Left Shoulder External Rotation --   reaches T3 with pain                        [facility] Adult PT Treatment/Exercise - 11/30/20 0001      Exercises   Exercises Shoulder      Shoulder Exercises: Supine   Other Supine Exercises supine cane pressups , pullover      Shoulder Exercises: Standing   External Rotation 10 reps;Left;Strengthening;Theraband  Theraband Level (Shoulder External Rotation) Level 2 (Red)    External Rotation Weight (lbs) x2 VC and TC    Internal Rotation 10 reps;Theraband;Strengthening;Left    Theraband Level (Shoulder Internal Rotation) Level 2 (Red)    Internal Rotation Weight (lbs) x2 VC and TC with towel roll    Extension Both;10 reps    Theraband Level (Shoulder Extension) Level 2 (Red)    Extension Limitations x2    Row Both;10 reps;Theraband    Theraband Level (Shoulder Row) Level 2 (Red)    Row Limitations x2      Shoulder Exercises: ROM/Strengthening   UBE (Upper Arm Bike) L1 forward x 2 minutes, back x 2 minutes      Iontophoresis   Type of Iontophoresis Dexamethasone    Location shoulder left anterior    Dose 1cc    Time 4-6 patch to be removed in 6 hours      Manual Therapy   Manual therapy comments PROM left shoulder flexion, abduction, STW left pec                    PT Short Term Goals - 11/16/20  1212      PT SHORT TERM GOAL #1   Title STG=LTG             PT Long Term Goals - 11/16/20 0931      PT LONG TERM GOAL #1   Title Pt will be independent with advanced HEP    Baseline no knowledge    Time 6    Period Weeks    Status New    Target Date 12/28/20      PT LONG TERM GOAL #2   Title Pt pain level 2/10 with functional activities    Baseline 7/10 with any functional activity    Time 6    Period Weeks    Status New    Target Date 12/28/20      PT LONG TERM GOAL #3   Title improve L shoulder flexion and abduction by 10 degrees each for improved function and more bil symmetry    Baseline Left shld flex 155, abd 155  R shld flex 165, abd 164,   limted left ER to 65 degrees    Time 6    Period Weeks    Status New    Target Date 12/28/20      PT LONG TERM GOAL #4   Title Pt will be able to bring 10 lb object from shelf to counter with pain level 2/10 or less    Baseline Pt with pain bringing arm from overhead shelf to counter without any weight  6/10    Time 6    Period Weeks    Status New    Target Date 12/28/20      PT LONG TERM GOAL #5   Title FOTO will improve from 74%    to 80%    indicating improved functional mobility .    Baseline eval 74%    Time 6    Period Weeks    Status New    Target Date 12/28/20                 Plan - 11/30/20 1042    Clinical Impression Statement Pt reports ionto patch was helpful last session. Continued with RTC strengthening with pt reporting fatigue in left shoulder. Signiicant tenderness in left pec muscles. Manual STW performed to decrease pain and muscle tension. Repeated ionto as  pt thought beneficial.    PT Next Visit Plan review HEP,  see arthrogram results, manual    PT Home Exercise Plan [email or web address]           Patient will benefit from skilled therapeutic intervention in order to improve the following deficits and impairments:  Pain,Obesity,Impaired UE functional use,Improper body mechanics,Postural  dysfunction,Impaired flexibility,Decreased strength,Decreased range of motion  Visit Diagnosis: Chronic left shoulder pain  Muscle weakness (generalized)  Stiffness of left shoulder, not elsewhere classified  Cramp and spasm  Abnormal posture     Problem List Patient Active Problem List   Diagnosis Date Noted  . Pain in left shoulder 11/03/2020  . History of melanoma 04/05/2020  . Left sided sciatica 12/01/2019  . Postherpetic neuralgia 11/28/2018  . Primary osteoarthritis of left knee 11/28/2018  . Need for influenza vaccination 03/28/2018  . S/P cataract extraction and insertion of intraocular lens, right 01/08/2017  . Environmental allergies 11/08/2015  . Seasonal allergies 11/08/2015  . Encounter for smoking cessation counseling 11/08/2015  . 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  . Hypertriglyceridemia 09/11/2013  . Depression 09/11/2013  . Insomnia 09/11/2013  . Generalized anxiety disorder 09/11/2013  . Dry mouth 09/11/2013  . Hyperglycemia 09/11/2013  . Mixed hyperlipidemia 10/28/2012  . Essential hypertension 10/28/2012  . 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 Obesity, unspecified 10/28/2012    Dorene Ar, PTA 11/30/2020, 11:37 AM  [facility] [address] [place], [place], [address] Phone: [phone]   Fax:  [phone]  Name: David [name]. MRN: [id number] Date of Birth: 08-Jun-1946

## 2020-11-30 NOTE — Addendum Note (Signed)
Addended by: Dorothea Ogle on: 11/30/2020 11:36 AM   Modules accepted: Orders

## 2020-12-02 ENCOUNTER — Other Ambulatory Visit: Payer: Self-pay

## 2020-12-02 ENCOUNTER — Ambulatory Visit: Payer: Medicare PPO | Admitting: Physical Therapy

## 2020-12-02 ENCOUNTER — Encounter: Payer: Self-pay | Admitting: Physical Therapy

## 2020-12-02 DIAGNOSIS — R252 Cramp and spasm: Secondary | ICD-10-CM

## 2020-12-02 DIAGNOSIS — M25512 Pain in left shoulder: Secondary | ICD-10-CM | POA: Diagnosis not present

## 2020-12-02 DIAGNOSIS — R293 Abnormal posture: Secondary | ICD-10-CM

## 2020-12-02 DIAGNOSIS — M25612 Stiffness of left shoulder, not elsewhere classified: Secondary | ICD-10-CM

## 2020-12-02 DIAGNOSIS — M6281 Muscle weakness (generalized): Secondary | ICD-10-CM

## 2020-12-02 DIAGNOSIS — G8929 Other chronic pain: Secondary | ICD-10-CM

## 2020-12-02 NOTE — Patient Instructions (Signed)
  Voncille Lo, PT, [place] Certified Exercise Expert for the Aging Adult  12/02/20 9:02 AM Phone: [phone] Fax: [phone]

## 2020-12-02 NOTE — Therapy (Signed)
[place], [place], [address] Phone: [phone]   Fax:  [phone]  Physical Therapy Treatment  Patient Details  Name: David [name]. MRN: [id number] Date of Birth: 1946/02/04 Referring Provider (PT): Joni Fears MD   Encounter Date: 12/02/2020   PT End of Session - 12/02/20 1036    Visit Number 4    Number of Visits 13    Date for PT Re-Evaluation 12/28/20    Authorization Type [employer] MCR/ [employer]    PT Start Time [phone]    PT Stop Time 0930    PT Time Calculation (min) 43 min    Activity Tolerance Patient tolerated treatment well    Behavior During Therapy [facility] for tasks assessed/performed           Past Medical History:  Diagnosis Date  . Allergic rhinitis due to pollen   . Anxiety state, unspecified   . Asymptomatic varicose veins   . Atherosclerosis of native arteries of the extremities, unspecified   . Cataract 10/17/2016   L Eye  . Contact dermatitis and other eczema due to other chemical products   . 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 Flatulence, eructation, and gas pain   . Headache(784.0)   . Hemorrhoids, external   . Herpes zoster with unspecified complication   . Hyperpotassemia   . Insomnia, unspecified   . Lumbago   . Lumbago   . Myalgia and myositis, unspecified   . Nocturia   . Obesity, unspecified   . Osteoarthrosis, unspecified whether generalized or localized, unspecified site   . Other abnormal blood chemistry   . Other headache syndromes(339.89)   . Pure hyperglyceridemia   . Rash and other nonspecific skin eruption   . Rotator cuff (capsule) sprain   . Routine general medical examination at a health care facility   . Tobacco use disorder   . Unspecified essential hypertension   . Unspecified hypertrophic and atrophic condition of skin   . Unspecified sleep apnea     Past Surgical History:  Procedure Laterality Date   . HEMORRHOID SURGERY    . VARICOSE VEIN SURGERY      There were no vitals filed for this visit.   Subjective Assessment - 12/02/20 0855    Subjective I am able to do well with my arm  about 1/10    Pertinent History OA, hx of melanoma, anxiety, obesity , HTN   Hyperlipidema, post herpetic neuralgia    Diagnostic tests x ray, scheduled for arthrogram    Patient Stated Goals I want my arm to stop hurting    Currently in Pain? Yes    Pain Score 1     Pain Location Shoulder    Pain Orientation Left    Pain Descriptors / Indicators Aching    Pain Onset More than a month ago    Pain Frequency Intermittent                             [facility] Adult PT Treatment/Exercise - 12/02/20 0001      Shoulder Exercises: Standing   External Rotation Left;Strengthening;Theraband;15 reps    Theraband Level (Shoulder External Rotation) Level 2 (Red)    External Rotation Weight (lbs) x2 VC and TC    Internal Rotation Theraband;Strengthening;Left;15 reps    Theraband Level (Shoulder Internal Rotation) Level 2 (Red)    Internal Rotation Weight (lbs) x2 VC  and TC with towel roll    Extension Both;10 reps    Theraband Level (Shoulder Extension) Level 2 (Red)    Extension Limitations x2    Row Both;10 reps;Theraband    Theraband Level (Shoulder Row) Level 2 (Red)    Row Limitations x2    Other Standing Exercises face pull  first rep 8 sec and then 9 more    Other Standing Exercises star pattern red t abnd 3 x 5 reps  VC And TC for posture and technique      Manual Therapy   Manual Therapy Soft tissue mobilization    Manual therapy comments PROM left shoulder flexion, abduction, STW left pec    Soft tissue mobilization use of tennis ball over left pec for home use.  STW of Left pectoralis      Neck Exercises: Stretches   Upper Trapezius Stretch 2 reps;Left;30 seconds    Upper Trapezius Stretch Limitations VC    Levator Stretch 2 reps;30 seconds;Left    Levator Stretch Limitations  vc                  PT Education - 12/02/20 0925    Education Details added face pulls to HEP  gave tennis ball for STW to left pec    Person(s) Educated Patient    Methods Explanation;Demonstration;Tactile cues;Verbal cues;Handout    Comprehension Verbalized understanding;Returned demonstration            PT Short Term Goals - 11/16/20 1212      PT SHORT TERM GOAL #1   Title STG=LTG             PT Long Term Goals - 11/16/20 0931      PT LONG TERM GOAL #1   Title Pt will be independent with advanced HEP    Baseline no knowledge    Time 6    Period Weeks    Status New    Target Date 12/28/20      PT LONG TERM GOAL #2   Title Pt pain level 2/10 with functional activities    Baseline 7/10 with any functional activity    Time 6    Period Weeks    Status New    Target Date 12/28/20      PT LONG TERM GOAL #3   Title improve L shoulder flexion and abduction by 10 degrees each for improved function and more bil symmetry    Baseline Left shld flex 155, abd 155  R shld flex 165, abd 164,   limted left ER to 65 degrees    Time 6    Period Weeks    Status New    Target Date 12/28/20      PT LONG TERM GOAL #4   Title Pt will be able to bring 10 lb object from shelf to counter with pain level 2/10 or less    Baseline Pt with pain bringing arm from overhead shelf to counter without any weight  6/10    Time 6    Period Weeks    Status New    Target Date 12/28/20      PT LONG TERM GOAL #5   Title FOTO will improve from 74%    to 80%    indicating improved functional mobility .    Baseline eval 74%    Time 6    Period Weeks    Status New    Target Date 12/28/20  Plan - 12/02/20 0856    Clinical Impression Statement Pt reports 1/10 pain today in left shld and reviewed HEP and added Face pulls in order to strengthen ER of bil shld.  Pt is becoming more aware of posture and is able to engage sub scapular stabilizers  before commencing  exericise wiht VC   Will cont POC    Personal Factors and Comorbidities Age;Comorbidity 1;Comorbidity 2    Comorbidities OA, hx of melanoma, anxiety, obesity , HTN   Hyperlipidema, post herpetic neuralgia    Examination-Activity Limitations Lift;Sleep;Carry    Examination-Participation Restrictions Yard Work    PT Treatment/Interventions ADLs/Self Care Home Management;Cryotherapy;Electrical Stimulation;Iontophoresis 4mg /ml Dexamethasone;Moist Heat;Therapeutic exercise;Therapeutic activities;Functional mobility training;Neuromuscular re-education;Patient/family education;Passive range of motion;Manual techniques;Dry needling;Taping;Joint Manipulations    PT Next Visit Plan review HEP,  Goals   FOTO  manual    PT Home Exercise Plan [email or web address]    Consulted and Agree with Plan of Care Patient           Patient will benefit from skilled therapeutic intervention in order to improve the following deficits and impairments:  Pain,Obesity,Impaired UE functional use,Improper body mechanics,Postural dysfunction,Impaired flexibility,Decreased strength,Decreased range of motion  Visit Diagnosis: Chronic left shoulder pain  Muscle weakness (generalized)  Stiffness of left shoulder, not elsewhere classified  Cramp and spasm  Abnormal posture Added to HEP  Access Code: [email or web address]: [email or web address]: 05/26/2022Prepared by: Lesly Rubenstein Notes Added Star pattern red t band exercises as shown in clinic 3 x 5 reps Exercises   Face Pulls - 1 x daily - 7 x weekly - 3 sets - 10 reps    Problem List Patient Active Problem List   Diagnosis Date Noted  . Pain in left shoulder 11/03/2020  . History of melanoma 04/05/2020  . Left sided sciatica 12/01/2019  . Postherpetic neuralgia 11/28/2018  . Primary osteoarthritis of left knee 11/28/2018  . Need for influenza vaccination 03/28/2018  . S/P cataract extraction and insertion of intraocular lens, right 01/08/2017  .  Environmental allergies 11/08/2015  . Seasonal allergies 11/08/2015  . Encounter for smoking cessation counseling 11/08/2015  . 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  . Hypertriglyceridemia 09/11/2013  . Depression 09/11/2013  . Insomnia 09/11/2013  . Generalized anxiety disorder 09/11/2013  . Dry mouth 09/11/2013  . Hyperglycemia 09/11/2013  . Mixed hyperlipidemia 10/28/2012  . Essential hypertension 10/28/2012  . 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 Obesity, unspecified 10/28/2012   Voncille Lo, PT, [place] Certified Exercise Expert for the Aging Adult  12/02/20 10:41 AM Phone: [phone] Fax: [facility] [address] [place], [place], [address] Phone: [phone]   Fax:  [phone]  Name: Monique Gift. MRN: [id number] Date of Birth: 09-14-1945

## 2020-12-07 ENCOUNTER — Ambulatory Visit: Payer: Medicare PPO | Admitting: Physical Therapy

## 2020-12-07 ENCOUNTER — Other Ambulatory Visit: Payer: Self-pay

## 2020-12-07 DIAGNOSIS — M6281 Muscle weakness (generalized): Secondary | ICD-10-CM

## 2020-12-07 DIAGNOSIS — M25612 Stiffness of left shoulder, not elsewhere classified: Secondary | ICD-10-CM

## 2020-12-07 DIAGNOSIS — R293 Abnormal posture: Secondary | ICD-10-CM

## 2020-12-07 DIAGNOSIS — G8929 Other chronic pain: Secondary | ICD-10-CM

## 2020-12-07 DIAGNOSIS — R252 Cramp and spasm: Secondary | ICD-10-CM

## 2020-12-07 DIAGNOSIS — M25512 Pain in left shoulder: Secondary | ICD-10-CM | POA: Diagnosis not present

## 2020-12-07 NOTE — Therapy (Signed)
[place], [place], [address] Phone: [phone]   Fax:  [phone]  Physical Therapy Treatment  Patient Details  Name: David [name]. MRN: [id number] Date of Birth: March 26, 1946 Referring Provider (PT): Joni Fears MD   Encounter Date: 12/07/2020   PT End of Session - 12/07/20 0912    Visit Number 5    Number of Visits 13    Date for PT Re-Evaluation 12/28/20    Authorization Type [employer] MCR/ [employer]    PT Start Time [phone]    PT Stop Time 0930    PT Time Calculation (min) 43 min    Activity Tolerance Patient tolerated treatment well    Behavior During Therapy [facility] for tasks assessed/performed           Past Medical History:  Diagnosis Date  . Allergic rhinitis due to pollen   . Anxiety state, unspecified   . Asymptomatic varicose veins   . Atherosclerosis of native arteries of the extremities, unspecified   . Cataract 10/17/2016   L Eye  . Contact dermatitis and other eczema due to other chemical products   . 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 Flatulence, eructation, and gas pain   . Headache(784.0)   . Hemorrhoids, external   . Herpes zoster with unspecified complication   . Hyperpotassemia   . Insomnia, unspecified   . Lumbago   . Lumbago   . Myalgia and myositis, unspecified   . Nocturia   . Obesity, unspecified   . Osteoarthrosis, unspecified whether generalized or localized, unspecified site   . Other abnormal blood chemistry   . Other headache syndromes(339.89)   . Pure hyperglyceridemia   . Rash and other nonspecific skin eruption   . Rotator cuff (capsule) sprain   . Routine general medical examination at a health care facility   . Tobacco use disorder   . Unspecified essential hypertension   . Unspecified hypertrophic and atrophic condition of skin   . Unspecified sleep apnea     Past Surgical History:  Procedure Laterality Date   . HEMORRHOID SURGERY    . VARICOSE VEIN SURGERY      There were no vitals filed for this visit.   Subjective Assessment - 12/07/20 0855    Subjective I mowed the lawn with a lot of pushing and pulling this weekend. I only mowed the back yard.  No pain today.    Pertinent History OA, hx of melanoma, anxiety, obesity , HTN   Hyperlipidema, post herpetic neuralgia    Limitations Lifting    Diagnostic tests x ray, scheduled for arthrogram    Patient Stated Goals I want my arm to stop hurting    Currently in Pain? No/denies    Pain Score 0-No pain    Pain Location Shoulder    Pain Orientation Left              [facility] PT Assessment - 12/07/20 0001      Assessment   Medical Diagnosis chronic left shoulder pain    Referring Provider (PT) whitfield, [name]    Onset Date/Surgical Date 05/11/20   6 months ago     Observation/Other Assessments   Focus on Therapeutic Outcomes (FOTO)  FOTO  intake 70%   predicted 72%  ( new goal 78%)      AROM   Right Shoulder Flexion 165 Degrees    Right Shoulder ABduction 163 Degrees    Right  Shoulder Internal Rotation 65 Degrees    Right Shoulder External Rotation 90 Degrees    Left Shoulder Flexion 160 Degrees    Left Shoulder ABduction 160 Degrees    Left Shoulder Internal Rotation --   between shoulder blades   Left Shoulder External Rotation --   reaches T4 no pain     Strength   Overall Strength Deficits    Right Shoulder Flexion 5/5    Right Shoulder Extension 5/5    Right Shoulder ABduction 5/5    Right Shoulder Internal Rotation --    Right Shoulder External Rotation --    Left Shoulder Flexion 5/5    Left Shoulder Extension 5/5    Left Shoulder ABduction 4+/5   pain   Left Shoulder Internal Rotation 4+/5    Left Shoulder External Rotation 4/5   pain                        [facility] Adult PT Treatment/Exercise - 12/07/20 0001      Self-Care   Self-Care Lifting    Lifting 10 lb with left arm to shelf above head x  3,  and with bil UE 15 lb with shelf above head x 3  , in half kneeling on mat Left 10 lb x 8 and then On right 1/2 kneeling 10 x 10 lb      Exercises   Exercises Shoulder      Shoulder Exercises: Standing   External Rotation Left;Strengthening;Theraband;15 reps    Theraband Level (Shoulder External Rotation) Level 3 (Green)    External Rotation Weight (lbs) x2 VC and TC    Internal Rotation Theraband;Strengthening;Left;15 reps    Theraband Level (Shoulder Internal Rotation) Level 3 (Green)    Internal Rotation Weight (lbs) x2 VC and TC with towel roll    Extension Both;10 reps    Theraband Level (Shoulder Extension) Level 3 (Green)    Extension Limitations x2    Row Both;10 reps;Theraband    Theraband Level (Shoulder Row) Level 3 (Green)    Row Limitations x2    Other Standing Exercises face pull  first rep 8 sec and then 9 more   green t band   Other Standing Exercises star pattern green t abnd 3 x 5 reps  VC And TC for posture and technique      Shoulder Exercises: ROM/Strengthening   UBE (Upper Arm Bike) L2 forward 2.5 min adn backward 2.5 min with VC for posture                  PT Education - 12/07/20 1745    Education Details reinforced HEP    Person(s) Educated Patient    Methods Explanation;Demonstration;Tactile cues;Verbal cues;Handout    Comprehension Verbalized understanding;Returned demonstration            PT Short Term Goals - 12/07/20 0857      PT SHORT TERM GOAL #1   Title STG=LTG             PT Long Term Goals - 12/07/20 0858      PT LONG TERM GOAL #1   Title Pt will be independent with advanced HEP    Baseline Independent with all given    Time 6    Period Weeks    Status On-going      PT LONG TERM GOAL #2   Title Pt pain level 2/10 with functional activities    Baseline Today 0/10 at rest  0/10 with t band exercises  lifting overhead with 10-15  lb 3-4/10    Time 6    Period Weeks    Status On-going      PT LONG TERM GOAL #3    Title improve L shoulder flexion and abduction by 10 degrees each for improved function and more bil symmetry    Baseline Left shld flex 160, abd 155  R shld flex 165, abd 164, ER and IR WFL no pain  See flowchart    Time 6    Period Weeks    Status Achieved      PT LONG TERM GOAL #4   Title Pt will be able to bring 10 lb object from shelf to counter with pain level 2/10 or less    Baseline Pt with pain bringing arm from overhead shelf to counter without any weight  3/10 with 15 and 10 lb wt    Time 6    Period Weeks    Status Partially Met      PT LONG TERM GOAL #5   Title FOTO will improve from 74%    to 80%    indicating improved functional mobility .    Baseline eval 74%   12-07-20 70%    Time 6    Period Weeks    Status On-going                 Plan - 12/07/20 1746    Clinical Impression Statement Pt reports 0/10 pain and FOTO report 70 intake  ( Eval 74) but functioning at better level of UE support and use of arm. able to lift 15 lb with bil UE above head into shelf and work withL UE lifting 10 #  Pt is doing well and will likely DC next visit if goals achieved due to progress achieved thus far and 0/10 pain    Personal Factors and Comorbidities Age;Comorbidity 1;Comorbidity 2    Comorbidities OA, hx of melanoma, anxiety, obesity , HTN   Hyperlipidema, post herpetic neuralgia    Examination-Activity Limitations Lift;Sleep;Carry    Examination-Participation Restrictions Yard Work    PT Frequency 2x / week    PT Duration 6 weeks    PT Treatment/Interventions ADLs/Self Care Home Management;Cryotherapy;Electrical Stimulation;Iontophoresis 38m/ml Dexamethasone;Moist Heat;Therapeutic exercise;Therapeutic activities;Functional mobility training;Neuromuscular re-education;Patient/family education;Passive range of motion;Manual techniques;Dry needling;Taping;Joint Manipulations    PT Next Visit Plan review HEP,  Goals and DC    PT Home Exercise Plan [email or web address]    Consulted and  Agree with Plan of Care Patient           Patient will benefit from skilled therapeutic intervention in order to improve the following deficits and impairments:     Visit Diagnosis: Chronic left shoulder pain  Muscle weakness (generalized)  Stiffness of left shoulder, not elsewhere classified  Cramp and spasm  Abnormal posture     Problem List Patient Active Problem List   Diagnosis Date Noted  . Pain in left shoulder 11/03/2020  . History of melanoma 04/05/2020  . Left sided sciatica 12/01/2019  . Postherpetic neuralgia 11/28/2018  . Primary osteoarthritis of left knee 11/28/2018  . Need for influenza vaccination 03/28/2018  . S/P cataract extraction and insertion of intraocular lens, right 01/08/2017  . Environmental allergies 11/08/2015  . Seasonal allergies 11/08/2015  . Encounter for smoking cessation counseling 11/08/2015  . 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  . Hypertriglyceridemia 09/11/2013  . Depression 09/11/2013  . Insomnia 09/11/2013  . Generalized anxiety disorder 09/11/2013  .  Dry mouth 09/11/2013  . Hyperglycemia 09/11/2013  . Mixed hyperlipidemia 10/28/2012  . Essential hypertension 10/28/2012  . 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 Obesity, unspecified 10/28/2012    David [name], PT, [place] Certified Exercise Expert for the Aging Adult  12/07/20 5:51 PM Phone: [phone] Fax: [place] [facility] [address] [place], [place], [address] Phone: [phone]   Fax:  [phone]  Name: David [name]. MRN: [id number] Date of Birth: 01/18/1946

## 2020-12-09 ENCOUNTER — Ambulatory Visit: Payer: Medicare PPO | Attending: Family | Admitting: Physical Therapy

## 2020-12-09 ENCOUNTER — Other Ambulatory Visit: Payer: Self-pay

## 2020-12-09 DIAGNOSIS — R293 Abnormal posture: Secondary | ICD-10-CM | POA: Insufficient documentation

## 2020-12-09 DIAGNOSIS — R252 Cramp and spasm: Secondary | ICD-10-CM | POA: Diagnosis present

## 2020-12-09 DIAGNOSIS — M6281 Muscle weakness (generalized): Secondary | ICD-10-CM | POA: Insufficient documentation

## 2020-12-09 DIAGNOSIS — M25512 Pain in left shoulder: Secondary | ICD-10-CM | POA: Diagnosis not present

## 2020-12-09 DIAGNOSIS — G8929 Other chronic pain: Secondary | ICD-10-CM | POA: Diagnosis present

## 2020-12-09 DIAGNOSIS — M25612 Stiffness of left shoulder, not elsewhere classified: Secondary | ICD-10-CM | POA: Insufficient documentation

## 2020-12-09 NOTE — Therapy (Signed)
[place], [place], [address] Phone: [phone]   Fax:  [phone]  Physical Therapy Treatment/Discharge Note  Patient Details  Name: David [name]. MRN: [id number] Date of Birth: 1945/07/13 Referring Provider (PT): Joni Fears MD   Encounter Date: 12/09/2020   PT End of Session - 12/09/20 0858    Visit Number 6    Number of Visits 13    Date for PT Re-Evaluation 12/28/20    Authorization Type [employer] MCR/ [employer]    PT Start Time 0812    PT Stop Time 0858    PT Time Calculation (min) 46 min    Activity Tolerance Patient tolerated treatment well    Behavior During Therapy [facility] for tasks assessed/performed           Past Medical History:  Diagnosis Date  . Allergic rhinitis due to pollen   . Anxiety state, unspecified   . Asymptomatic varicose veins   . Atherosclerosis of native arteries of the extremities, unspecified   . Cataract 10/17/2016   L Eye  . Contact dermatitis and other eczema due to other chemical products   . 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 Flatulence, eructation, and gas pain   . Headache(784.0)   . Hemorrhoids, external   . Herpes zoster with unspecified complication   . Hyperpotassemia   . Insomnia, unspecified   . Lumbago   . Lumbago   . Myalgia and myositis, unspecified   . Nocturia   . Obesity, unspecified   . Osteoarthrosis, unspecified whether generalized or localized, unspecified site   . Other abnormal blood chemistry   . Other headache syndromes(339.89)   . Pure hyperglyceridemia   . Rash and other nonspecific skin eruption   . Rotator cuff (capsule) sprain   . Routine general medical examination at a health care facility   . Tobacco use disorder   . Unspecified essential hypertension   . Unspecified hypertrophic and atrophic condition of skin   . Unspecified sleep apnea     Past Surgical History:  Procedure  Laterality Date  . HEMORRHOID SURGERY    . VARICOSE VEIN SURGERY      There were no vitals filed for this visit.   Subjective Assessment - 12/09/20 0818    Subjective I woke up late this morning and ran over here and I am stiff inmy back but I do not have any pain in my shoulder    Pertinent History OA, hx of melanoma, anxiety, obesity , HTN   Hyperlipidema, post herpetic neuralgia    Limitations Lifting    Currently in Pain? No/denies    Pain Score 0-No pain    Pain Location Shoulder    Pain Orientation Left    Pain Type Chronic pain    Pain Onset More than a month ago              [facility] PT Assessment - 12/09/20 0001      Assessment   Medical Diagnosis chronic left shoulder pain    Referring Provider (PT) whitfield, [name]    Onset Date/Surgical Date 05/11/20   6 months ago     Observation/Other Assessments   Focus on Therapeutic Outcomes (FOTO)  FOTO  intake 70%   predicted 72%  ( new goal 78%)   taken 12-08-20  not consistent with improvement observed     AROM   Right Shoulder Flexion 165 Degrees    Right Shoulder  ABduction 163 Degrees    Right Shoulder Internal Rotation 65 Degrees    Right Shoulder External Rotation 90 Degrees    Left Shoulder Flexion 160 Degrees    Left Shoulder ABduction 160 Degrees    Left Shoulder Internal Rotation --   between shoulder blades   Left Shoulder External Rotation --   reaches T4 no pain     Strength   Overall Strength Deficits    Right Shoulder Flexion 5/5    Right Shoulder Extension 5/5    Right Shoulder ABduction 5/5    Left Shoulder Flexion 5/5    Left Shoulder Extension 5/5    Left Shoulder ABduction 4+/5   pain   Left Shoulder Internal Rotation 4+/5    Left Shoulder External Rotation 4/5   pain                        [facility] Adult PT Treatment/Exercise - 12/09/20 0001      Self-Care   Self-Care Other Self-Care Comments    Posture reviewed posture for exercise and injury prevention    Other Self-Care  Comments  discussed progression of HEP and adding weights      Exercises   Exercises Shoulder      Lumbar Exercises: Aerobic   Recumbent Bike L2 5 min UE/LE      Lumbar Exercises: Standing   Other Standing Lumbar Exercises sink squat 1 x 10 and then x 10 with 15 # VC and TC      Shoulder Exercises: Standing   External Rotation Left;Strengthening;Theraband;15 reps    Theraband Level (Shoulder External Rotation) Level 3 (Green)    External Rotation Weight (lbs) x2 VC and TC    Internal Rotation Theraband;Strengthening;Left;15 reps    Theraband Level (Shoulder Internal Rotation) Level 3 (Green)    Internal Rotation Weight (lbs) x2 VC and TC with towel roll    Extension Both;10 reps    Theraband Level (Shoulder Extension) Level 3 (Green)    Extension Limitations x2    Row Both;10 reps;Theraband    Theraband Level (Shoulder Row) Level 3 (Green)    Row Limitations x2    Other Standing Exercises face pull  first rep 8 sec and then 8 more   green t band   Other Standing Exercises star pattern green t abnd 3 x 5 reps  VC And TC for posture and technique      Neck Exercises: Stretches   Upper Trapezius Stretch 2 reps;Left;30 seconds    Upper Trapezius Stretch Limitations VC    Levator Stretch 2 reps;30 seconds;Left    Levator Stretch Limitations vc                  PT Education - 12/09/20 0846    Education Details reviewed goals and HEP for home use.  reviewed tennis ball self myofascial stretch on wall with shld abduction    Person(s) Educated Patient    Methods Explanation;Demonstration;Tactile cues;Verbal cues;Handout    Comprehension Verbalized understanding;Returned demonstration            PT Short Term Goals - 12/09/20 0822      PT SHORT TERM GOAL #1   Title STG=LTG             PT Long Term Goals - 12/09/20 2353      PT LONG TERM GOAL #1   Title Pt will be independent with advanced HEP    Baseline Independent with all given  Time 6    Period Weeks     Status Achieved      PT LONG TERM GOAL #2   Title Pt pain level 2/10 with functional activities    Baseline Today 0/10 at rest  and with functional activities like lawn mower pushing    Time 6    Period Weeks    Status Achieved      PT LONG TERM GOAL #3   Title improve L shoulder flexion and abduction by 10 degrees each for improved function and more bil symmetry    Baseline Left shld flex 160, abd 155  R shld flex 165, abd 164, ER and IR WFL no pain  See flowchart    Time 6    Period Weeks    Status Achieved      PT LONG TERM GOAL #4   Title Pt will be able to bring 10 lb object from shelf to counter with pain level 2/10 or less    Baseline Pt with tightness in left shld but no pain    Time 6    Period Weeks    Status Achieved      PT LONG TERM GOAL #5   Title FOTO will improve from 74%    to 80%    indicating improved functional mobility .    Baseline eval 74%   12-07-20 70%  Not consistent with improvement observed in clinic able to lift 15 overhead    Status Partially Met                 Plan - 12/09/20 0834    Clinical Impression Statement Mr Briner returns for 6th and final visit for Left shld pain.  Pt is 0/10 pain and is lifting 15 # overhead without pain.  Pt given green and blue t band to progress HEP and reviewed all exericses for home use.  Pt FOTO score was 70 but did not reflect true progress observed by PT. See flow chart for MMT/ AROM improvement.  Pt will return to his lift with stronger shoulder and plan for progression.  Will DC due to pt being pleased with functional progress and Independence with HEP    Personal Factors and Comorbidities Age;Comorbidity 1;Comorbidity 2    Comorbidities OA, hx of melanoma, anxiety, obesity , HTN   Hyperlipidema, post herpetic neuralgia    Examination-Activity Limitations Lift;Sleep;Carry    Examination-Participation Restrictions Yard Work    PT Frequency 2x / week    PT Duration 6 weeks    PT Treatment/Interventions  ADLs/Self Care Home Management;Cryotherapy;Electrical Stimulation;Iontophoresis 52m/ml Dexamethasone;Moist Heat;Therapeutic exercise;Therapeutic activities;Functional mobility training;Neuromuscular re-education;Patient/family education;Passive range of motion;Manual techniques;Dry needling;Taping;Joint Manipulations    PT Next Visit Plan review HEP,  Goals and DC    PT Home Exercise Plan [email or web address]    Consulted and Agree with Plan of Care Patient           Patient will benefit from skilled therapeutic intervention in order to improve the following deficits and impairments:  Pain,Obesity,Impaired UE functional use,Improper body mechanics,Postural dysfunction,Impaired flexibility,Decreased strength,Decreased range of motion  Visit Diagnosis: Chronic left shoulder pain  Muscle weakness (generalized)  Stiffness of left shoulder, not elsewhere classified  Cramp and spasm  Abnormal posture     Problem List Patient Active Problem List   Diagnosis Date Noted  . Pain in left shoulder 11/03/2020  . History of melanoma 04/05/2020  . Left sided sciatica 12/01/2019  . Postherpetic neuralgia 11/28/2018  . Primary osteoarthritis of left  knee 11/28/2018  . Need for influenza vaccination 03/28/2018  . S/P cataract extraction and insertion of intraocular lens, right 01/08/2017  . Environmental allergies 11/08/2015  . Seasonal allergies 11/08/2015  . Encounter for smoking cessation counseling 11/08/2015  . 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  . Hypertriglyceridemia 09/11/2013  . Depression 09/11/2013  . Insomnia 09/11/2013  . Generalized anxiety disorder 09/11/2013  . Dry mouth 09/11/2013  . Hyperglycemia 09/11/2013  . Mixed hyperlipidemia 10/28/2012  . Essential hypertension 10/28/2012  . 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 Obesity, unspecified 10/28/2012    Voncille Lo, PT, [place] Certified Exercise Expert for the Aging Adult  12/09/20 9:08 AM Phone:  [phone] Fax: [place] [facility] [address] [place], [place], [address] Phone: [phone]   Fax:  [phone]  Name: David [name]. MRN: [id number] Date of Birth: [birth year]   PHYSICAL THERAPY DISCHARGE SUMMARY  Visits from Start of Care: 6  Current functional level related to goals / functional outcomes: As above   Remaining deficits: none   Education / Equipment: HEP with green and blue for progression  Plan: Patient agrees to discharge.  Patient goals were met. Patient is being discharged due to meeting the stated rehab goals.  ?????    And being pleased with current functional progress Voncille Lo, PT, [facility] Certified Exercise Expert for the Aging Adult  12/09/20 9:09 AM Phone: [phone] Fax: [phone]

## 2020-12-09 NOTE — Patient Instructions (Signed)
Reviewed all exercises and given hard copyAccess Code: [email or web address]: [email or web address]: 06/02/2022Prepared by: Lesly Rubenstein Notes Added Star pattern red t band exercises as shown in clinic 3 x 5 reps Exercises  Seated Gentle Upper Trapezius Stretch - 2 x daily - 7 x weekly - 1 sets - 3 reps - 20-20 sec hold  Gentle Levator Scapulae Stretch - 2 x daily - 7 x weekly - 1 sets - 3 reps - 20-30 hold  Shoulder External Rotation and Scapular Retraction with Resistance - 1-2 x daily - 7 x weekly - 3 sets - 10 reps - 3-5 sec hold  Scapular Retraction with Resistance - 1-2 x daily - 7 x weekly - 3 sets - 10 reps  Scapular Retraction with Resistance Advanced - 1-2 x daily - 7 x weekly - 3 sets - 10 reps  Shoulder Internal Rotation with Resistance - 1 x daily - 7 x weekly - 3 sets - 10 reps  Face Pulls - 1 x daily - 7 x weekly - 3 sets - 10 reps  Sit to Stand with Counter Support - 1 x daily - 7 x weekly - 3 sets - 10 reps     Voncille Lo, PT, [place] Certified Exercise Expert for the Aging Adult  12/09/20 8:57 AM Phone: [phone] Fax: [phone]

## 2020-12-14 ENCOUNTER — Encounter: Payer: Medicare PPO | Admitting: Physical Therapy

## 2020-12-16 ENCOUNTER — Encounter: Payer: Medicare PPO | Admitting: Physical Therapy

## 2020-12-21 ENCOUNTER — Encounter: Payer: Medicare PPO | Admitting: Physical Therapy

## 2020-12-23 ENCOUNTER — Encounter: Payer: Medicare PPO | Admitting: Physical Therapy

## 2020-12-29 ENCOUNTER — Other Ambulatory Visit: Payer: Self-pay

## 2020-12-29 ENCOUNTER — Other Ambulatory Visit: Payer: Self-pay | Admitting: Orthopedic Surgery

## 2020-12-29 DIAGNOSIS — F331 Major depressive disorder, recurrent, moderate: Secondary | ICD-10-CM

## 2020-12-29 DIAGNOSIS — F5105 Insomnia due to other mental disorder: Secondary | ICD-10-CM

## 2020-12-29 DIAGNOSIS — F99 Mental disorder, not otherwise specified: Secondary | ICD-10-CM

## 2020-12-29 MED ORDER — TRAZODONE HCL 150 MG PO TABS: 150.0000 mg | ORAL_TABLET | Freq: Every day | ORAL | 1 refills | Status: DC

## 2020-12-29 MED ORDER — TRAZODONE HCL 100 MG PO TABS: 150.0000 mg | ORAL_TABLET | Freq: Every day | ORAL | 0 refills | Status: DC

## 2020-12-29 NOTE — Telephone Encounter (Signed)
Patient would like refill on medication.  Medication pended and sent to Windell Moulding, NP for approval

## 2020-12-29 NOTE — Telephone Encounter (Signed)
Thank you for update. I changed his prescription to Trazodone 150 mg - take one tablet at night for sleep. He does not have to cut a tablet in half, I also gave him a 90 day supply.

## 2020-12-29 NOTE — Telephone Encounter (Signed)
Patient was in office waiting to see if provider would approve rx and he plans to go to the pharmacy now to pick-up.  Patient states for future refills he would like instructions to indicate 1 tablet daily versus 1.5 tablets by mouth daily as it is hard to half the pills.  Patient states with the current dispense number and instructions pills will only last him 45 days, yet he is ok with that, this time.  Patient informed he can ask the pharmacy to half for him and they may or may not be able to honor that request.   Patient also aware he can ask the pharmacy to put rx on automated refill request and they will send [place] a request when refill is due versus him coming to the office every time.   Patient aware I will send message to Windell Moulding E, NP and if she is in agreement with changing the instructions to reflect 1 by mouth daily we can update medication list for future refills.

## 2020-12-31 ENCOUNTER — Encounter: Payer: Self-pay | Admitting: Family

## 2020-12-31 ENCOUNTER — Ambulatory Visit (INDEPENDENT_AMBULATORY_CARE_PROVIDER_SITE_OTHER): Payer: Medicare PPO | Admitting: Family

## 2020-12-31 ENCOUNTER — Other Ambulatory Visit: Payer: Self-pay

## 2020-12-31 VITALS — BP 140/70 | HR 98 | Temp 97.5°F | Resp 16 | Ht 70.0 in | Wt 212.6 lb

## 2020-12-31 DIAGNOSIS — Z0000 Encounter for general adult medical examination without abnormal findings: Secondary | ICD-10-CM | POA: Diagnosis not present

## 2020-12-31 DIAGNOSIS — Z1211 Encounter for screening for malignant neoplasm of colon: Secondary | ICD-10-CM

## 2020-12-31 DIAGNOSIS — I10 Essential (primary) hypertension: Secondary | ICD-10-CM

## 2020-12-31 MED ORDER — LOSARTAN POTASSIUM 50 MG PO TABS: ORAL_TABLET | ORAL | 3 refills | Status: DC

## 2020-12-31 NOTE — Patient Instructions (Signed)
David [name], Thank you for taking time to come for your Medicare Wellness Visit. I appreciate your ongoing commitment to your health goals. Please review the following plan we discussed and let me know if I can assist you in the future.   Screening recommendations/referrals: Colonoscopy: Ordered  Recommended yearly ophthalmology/optometry visit for glaucoma screening and checkup Recommended yearly dental visit for hygiene and checkup  Vaccinations: Influenza vaccine Up to date  Pneumococcal vaccine Up to date  Tdap vaccine Up to date  Shingles vaccine Up to date    Advanced directives: No   Conditions/risks identified: Advance age male > 64 yrs ,Hypertension,male Gender,BMI > 30,Hx of smoking   Next appointment: 1 year   Preventive Care 47 Years and Older, Male Preventive care refers to lifestyle choices and visits with your health care provider that can promote health and wellness. What does preventive care include? A yearly physical exam. This is also called an annual well check. Dental exams once or twice a year. Routine eye exams. Ask your health care provider how often you should have your eyes checked. Personal lifestyle choices, including: Daily care of your teeth and gums. Regular physical activity. Eating a healthy diet. Avoiding tobacco and drug use. Limiting alcohol use. Practicing safe sex. Taking low doses of aspirin every day. Taking vitamin and mineral supplements as recommended by your health care provider. What happens during an annual well check? The services and screenings done by your health care provider during your annual well check will depend on your age, overall health, lifestyle risk factors, and family history of disease. Counseling  Your health care provider may ask you questions about your: Alcohol use. Tobacco use. Drug use. Emotional well-being. Home and relationship well-being. Sexual activity. Eating habits. History of falls. Memory and  ability to understand (cognition). Work and work Statistician. Screening  You may have the following tests or measurements: Height, weight, and BMI. Blood pressure. Lipid and cholesterol levels. These may be checked every 5 years, or more frequently if you are over 13 years old. Skin check. Lung cancer screening. You may have this screening every year starting at age 61 if you have a 30-pack-year history of smoking and currently smoke or have quit within the past 15 years. Fecal occult blood test (FOBT) of the stool. You may have this test every year starting at age 33. Flexible sigmoidoscopy or colonoscopy. You may have a sigmoidoscopy every 5 years or a colonoscopy every 10 years starting at age 53. Prostate cancer screening. Recommendations will vary depending on your family history and other risks. Hepatitis C blood test. Hepatitis B blood test. Sexually transmitted disease (STD) testing. Diabetes screening. This is done by checking your blood sugar (glucose) after you have not eaten for a while (fasting). You may have this done every 1-3 years. Abdominal aortic aneurysm (AAA) screening. You may need this if you are a current or former smoker. Osteoporosis. You may be screened starting at age 4 if you are at high risk. Talk with your health care provider about your test results, treatment options, and if necessary, the need for more tests. Vaccines  Your health care provider may recommend certain vaccines, such as: Influenza vaccine. This is recommended every year. Tetanus, diphtheria, and acellular pertussis (Tdap, Td) vaccine. You may need a Td booster every 10 years. Zoster vaccine. You may need this after age 76. Pneumococcal 13-valent conjugate (PCV13) vaccine. One dose is recommended after age 7. Pneumococcal polysaccharide (PPSV23) vaccine. One dose is recommended after age  47. Talk to your health care provider about which screenings and vaccines you need and how often you need  them. This information is not intended to replace advice given to you by your health care provider. Make sure you discuss any questions you have with your health care provider. Document Released: 07/23/2015 Document Revised: 03/15/2016 Document Reviewed: 04/27/2015 Elsevier Interactive Patient Education  2017 [place] Prevention in the Home Falls can cause injuries. They can happen to people of all ages. There are many things you can do to make your home safe and to help prevent falls. What can I do on the outside of my home? Regularly fix the edges of walkways and driveways and fix any cracks. Remove anything that might make you trip as you walk through a door, such as a raised step or threshold. Trim any bushes or trees on the path to your home. Use bright outdoor lighting. Clear any walking paths of anything that might make someone trip, such as rocks or tools. Regularly check to see if handrails are loose or broken. Make sure that both sides of any steps have handrails. Any raised decks and porches should have guardrails on the edges. Have any leaves, snow, or ice cleared regularly. Use sand or salt on walking paths during winter. Clean up any spills in your garage right away. This includes oil or grease spills. What can I do in the bathroom? Use night lights. Install grab bars by the toilet and in the tub and shower. Do not use towel bars as grab bars. Use non-skid mats or decals in the tub or shower. If you need to sit down in the shower, use a plastic, non-slip stool. Keep the floor dry. Clean up any water that spills on the floor as soon as it happens. Remove soap buildup in the tub or shower regularly. Attach bath mats securely with double-sided non-slip rug tape. Do not have throw rugs and other things on the floor that can make you trip. What can I do in the bedroom? Use night lights. Make sure that you have a light by your bed that is easy to reach. Do not use  any sheets or blankets that are too big for your bed. They should not hang down onto the floor. Have a firm chair that has side arms. You can use this for support while you get dressed. Do not have throw rugs and other things on the floor that can make you trip. What can I do in the kitchen? Clean up any spills right away. Avoid walking on wet floors. Keep items that you use a lot in easy-to-reach places. If you need to reach something above you, use a strong step stool that has a grab bar. Keep electrical cords out of the way. Do not use floor polish or wax that makes floors slippery. If you must use wax, use non-skid floor wax. Do not have throw rugs and other things on the floor that can make you trip. What can I do with my stairs? Do not leave any items on the stairs. Make sure that there are handrails on both sides of the stairs and use them. Fix handrails that are broken or loose. Make sure that handrails are as long as the stairways. Check any carpeting to make sure that it is firmly attached to the stairs. Fix any carpet that is loose or worn. Avoid having throw rugs at the top or bottom of the stairs. If you do have  throw rugs, attach them to the floor with carpet tape. Make sure that you have a light switch at the top of the stairs and the bottom of the stairs. If you do not have them, ask someone to add them for you. What else can I do to help prevent falls? Wear shoes that: Do not have high heels. Have rubber bottoms. Are comfortable and fit you well. Are closed at the toe. Do not wear sandals. If you use a stepladder: Make sure that it is fully opened. Do not climb a closed stepladder. Make sure that both sides of the stepladder are locked into place. Ask someone to hold it for you, if possible. Clearly mark and make sure that you can see: Any grab bars or handrails. First and last steps. Where the edge of each step is. Use tools that help you move around (mobility aids)  if they are needed. These include: Canes. Walkers. Scooters. Crutches. Turn on the lights when you go into a dark area. Replace any light bulbs as soon as they burn out. Set up your furniture so you have a clear path. Avoid moving your furniture around. If any of your floors are uneven, fix them. If there are any pets around you, be aware of where they are. Review your medicines with your doctor. Some medicines can make you feel dizzy. This can increase your chance of falling. Ask your doctor what other things that you can do to help prevent falls. This information is not intended to replace advice given to you by your health care provider. Make sure you discuss any questions you have with your health care provider. Document Released: 04/22/2009 Document Revised: 12/02/2015 Document Reviewed: 07/31/2014 Elsevier Interactive Patient Education  2017 [employer].

## 2020-12-31 NOTE — Progress Notes (Signed)
Subjective:   David [name]. is a 75 y.o. male who presents for Medicare Annual/Subsequent preventive examination.  Review of Systems     Cardiac Risk Factors include: advanced age (>15men, >56 women);hypertension;male gender;obesity (BMI >30kg/m2);smoking/ tobacco exposure     Objective:    Today's Vitals   12/31/20 0930  BP: 140/70  Pulse: 98  Resp: 16  Temp: (!) 97.5 F (36.4 C)  SpO2: 96%  Weight: 212 lb 9.6 oz (96.4 kg)  Height: 5\' 10"  (1.778 m)   Body mass index is 30.5 kg/m.  Advanced Directives 12/31/2020 11/26/2020 11/18/2020 11/16/2020 08/13/2020 08/12/2020 04/05/2020  Does Patient Have a Medical Advance Directive? No No No No No No No  Would patient like information on creating a medical advance directive? No - Patient declined No - Patient declined No - Patient declined No - Patient declined No - Patient declined No - Patient declined No - Patient declined    Current Medications (verified) Outpatient Encounter Medications as of 12/31/2020  Medication Sig   loratadine (CLARITIN) 10 MG tablet TAKE 1 TABLET BY MOUTH DAILY AS NEEDED FOR ALLERGIES   losartan (COZAAR) 50 MG tablet TAKE 1 TABLET(50 MG) BY MOUTH DAILY FOR BLOOD PRESSURE   Multiple Vitamin (MULTIVITAMIN PO) Take 1 tablet by mouth daily.   simvastatin (ZOCOR) 20 MG tablet Take 1 tablet (20 mg total) by mouth daily at 6 PM.   traZODone (DESYREL) 150 MG tablet Take 1 tablet (150 mg total) by mouth at bedtime.   No facility-administered encounter medications on file as of 12/31/2020.    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    Flatulence, eructation, and gas pain    Headache(784.0)    Hemorrhoids, external    Herpes zoster with unspecified complication    Hyperpotassemia    Insomnia, unspecified    Lumbago    Lumbago    Myalgia and myositis, unspecified    Nocturia    Obesity, unspecified    Osteoarthrosis, unspecified whether generalized or localized, unspecified site    Other abnormal blood chemistry    Other headache syndromes(339.89)    Pure hyperglyceridemia    Rash and other nonspecific skin eruption    Rotator cuff (capsule) sprain    Routine general medical examination at a health care facility    Tobacco use disorder    Unspecified essential hypertension    Unspecified hypertrophic and atrophic condition of skin    Unspecified sleep apnea    Past Surgical History:  Procedure Laterality Date   HEMORRHOID SURGERY     VARICOSE VEIN SURGERY     Family History  Problem Relation Age of Onset   Diabetes Mother    Heart disease Mother    Stroke Mother    Stroke Father    Social History   Socioeconomic History   Marital status: Married    Spouse name: Not on file   Number of children: Not on file   Years of education: Not on file   Highest education level: Not on file  Occupational History   Not on file  Tobacco Use   Smoking status: Former    Packs/day: 1.50    Years: 55.00    Pack years: 82.50    Types: Cigarettes    Quit date: 07/30/2015    Years since quitting: 5.4   Smokeless tobacco: Never   Tobacco comments:    pt thinks increased effexor dose helped him to quit  Vaping Use   Vaping Use: Never used  Substance and Sexual Activity   Alcohol use: No    Alcohol/week: 0.0 standard drinks   Drug use: No   Sexual activity: Not Currently  Other Topics Concern   Not on file  Social History Narrative   Married   Stopped smoking 07/30/15   Alcohol none   Exercise none   Social research officer, government  1967 1987    Social Determinants of Health   Financial Resource Strain: Not on file  Food Insecurity: Not on file  Transportation Needs: Not on file  Physical Activity: Not on file  Stress: Not on file  Social Connections: Not on file    Tobacco Counseling Counseling given: Not Answered Tobacco comments: pt thinks increased effexor dose helped him to quit   Clinical Intake:  Pre-visit preparation completed: No  Pain : No/denies pain     BMI - recorded: 30.13 Nutritional Status: BMI > 30  Obese Nutritional Risks: None Diabetes: No  How often do you need to have someone help you when you read instructions, pamphlets, or other written materials from your doctor or pharmacy?: 1 - Never What is the last grade level you completed in school?: 12 Grade  Diabetic?No   Interpreter Needed?: No  Information entered by :: David Sear,FNP-C   Activities of Daily Living In your present state of health, do you have any difficulty performing the following activities: 12/31/2020  Hearing? N  Vision? N  Difficulty concentrating or making decisions? Y  Comment remembering  Walking or climbing stairs? N  Dressing or bathing? N  Doing errands, shopping? N  Preparing Food and eating ? N  Using the Toilet? Y  Comment strain sometimes  In the past six months, have you accidently leaked urine? N  Do you have problems with loss of bowel control? N  Managing your Medications? N  Managing your Finances? N  Housekeeping or managing your Housekeeping? N  Some recent data might be hidden    Patient Care Team: Yvonna Alanis, NP as PCP - General (Adult Health Nurse Practitioner) Katy Apo, MD as Consulting Physician (Ophthalmology)  Indicate any recent Medical Services you may have received from other than [facility] providers in the past year (date may be approximate).     Assessment:   This is a routine wellness examination for David [name].  Hearing/Vision screen No results  found.  Dietary issues and exercise activities discussed: Current Exercise Habits: The patient does not participate in regular exercise at present, Exercise limited by: None identified   Goals Addressed             This Visit's Progress    lose weight   Not on track    Starting 11/16/16 I will start trying to walk to lose weight.         Depression Screen PHQ 2/9 Scores 04/05/2020 12/30/2019 12/01/2019 08/04/2019 07/31/2019 12/24/2018 07/29/2018  PHQ - 2 Score 0 0 0 0 0 0 0  PHQ- 9 Score - - - - - - -    Fall Risk Fall Risk  12/31/2020 11/26/2020 11/18/2020 08/12/2020 04/05/2020  Falls in the past year? 0 0 0 1 1  Number falls in past yr: 0 0 0 0 0  Injury with Fall? 0 0 0 0 0  Risk for fall due to : No Fall Risks - - - -    FALL RISK PREVENTION PERTAINING TO THE HOME:  Any stairs in or around the home? Yes  If so, are there any without handrails? No  Home free of loose throw rugs in walkways, pet beds, electrical cords, etc? No  Adequate lighting in your home to reduce risk of falls? Yes   ASSISTIVE DEVICES UTILIZED TO PREVENT FALLS:  Life alert? No  Use of a cane, walker or w/c? No  Grab bars in the bathroom? No  Shower chair or bench in shower? No  Elevated toilet seat or a handicapped toilet? No   TIMED UP AND GO:  Was the test performed? Yes .  Length of time to ambulate 10 feet: 5 sec.   Gait steady and fast without use of assistive device  Cognitive Function: MMSE - Mini Mental State Exam 12/31/2020 12/24/2018 12/20/2017 11/16/2016 11/08/2015  Orientation to time 5 5 5 5 5   Orientation to Place 5 5 5 5 5   Registration 3 3 3 3 3   Attention/ Calculation 5 5 5 5 5   Recall 2 3 3  0 1  Language- name 2 objects 2 2 2 2 2   Language- repeat 1 1 1 1 1   Language- follow 3 step command 3 3 3 3 3   Language- read & follow direction 1 1 1 1 1   Write a sentence 1 1 1 1 1   Copy design 1 1 1  0 1  Total score 29 30 30 26 28      6CIT Screen 12/30/2019  What Year? 0 points  What  month? 0 points  What time? 0 points  Count back from 20 0 points  Months in reverse 2 points  Repeat phrase 0 points  Total Score 2    Immunizations Immunization History  Administered Date(s) Administered   Fluad Quad(high Dose 65+) 03/31/2019, 04/05/2020   Influenza, High Dose Seasonal PF 04/03/2017, 05/02/2017, 03/28/2018, 05/07/2018, 05/08/2019, 05/10/2020   Influenza,inj,Quad PF,6+ Mos 06/13/2013, 04/10/2014, 05/06/2015, 05/12/2016   Influenza-Unspecified 04/09/2010, 05/09/2012   PFIZER(Purple Top)SARS-COV-2 Vaccination 09/07/2019, 10/07/2019, 05/06/2020   Pneumococcal Conjugate-13 04/16/2014   Pneumococcal Polysaccharide-23 01/26/2011, 11/25/2015, 05/02/2017, 05/07/2018, 05/08/2019, 05/10/2020   Td 07/10/2000, 06/13/2013   Zoster Recombinat (Shingrix) 11/20/2016, 03/28/2017   Zoster, Live 08/05/2012    TDAP status: Up to date  Flu Vaccine status: Up to date  Pneumococcal vaccine status: Up to date  Covid-19 vaccine status: Information provided on how to obtain vaccines.   Qualifies for Shingles Vaccine? Yes   Zostavax completed Yes   Shingrix Completed?: Yes  Screening Tests Health Maintenance  Topic Date Due   COVID-19 Vaccine (4 - Booster for [place] series) 08/06/2020   COLONOSCOPY (Pts 45-50yrs Insurance coverage will need to be confirmed)  08/31/2020   INFLUENZA VACCINE  02/07/2021   TETANUS/TDAP  06/14/2023   Hepatitis C Screening  Completed   PNA vac Low Risk Adult  Completed   Zoster Vaccines- Shingrix  Completed   HPV VACCINES  Aged Out    Health Maintenance  Health Maintenance Due  Topic Date Due   COVID-19 Vaccine (4 - Booster for [place] series) 08/06/2020   COLONOSCOPY (Pts 45-69yrs Insurance coverage will need to be confirmed)  08/31/2020    Colorectal  cancer screening: Referral to GI placed today . Pt aware the office will call re: appt.  Lung Cancer Screening: (Low Dose CT Chest recommended if Age 74-80 years, 30 pack-year currently  smoking OR have quit w/in 15years.) does qualify.   Lung Cancer Screening Referral: No declined.  Additional Screening:  Hepatitis C Screening: does not qualify; Completed yes  Vision Screening: Recommended annual ophthalmology exams for early detection of glaucoma and other disorders of the eye. Is the patient up to date with their annual eye exam?  Yes  Who is the provider or what is the name of the office in which the patient attends annual eye exams? Dr.Lyles  If pt is not established with a provider, would they like to be referred to a provider to establish care? No .   Dental Screening: Recommended annual dental exams for proper oral hygiene  Community Resource Referral / Chronic Care Management: CRR required this visit?  No   CCM required this visit?  No      Plan:     I have personally reviewed and noted the following in the patient's chart:   Medical and social history Use of alcohol, tobacco or illicit drugs  Current medications and supplements including opioid prescriptions. Patient is not currently taking opioid prescriptions. Functional ability and status Nutritional status Physical activity Advanced directives List of other physicians Hospitalizations, surgeries, and ER visits in previous 12 months Vitals Screenings to include cognitive, depression, and falls Referrals and appointments  In addition, I have reviewed and discussed with patient certain preventive protocols, quality metrics, and best practice recommendations. A written personalized care plan for preventive services as well as general preventive health recommendations were provided to patient.     Sandrea Hughs, NP   12/31/2020   Nurse Notes: declined low CT scan of chest

## 2021-02-21 ENCOUNTER — Other Ambulatory Visit: Payer: Self-pay

## 2021-02-21 ENCOUNTER — Other Ambulatory Visit: Payer: Medicare PPO

## 2021-02-21 DIAGNOSIS — I10 Essential (primary) hypertension: Secondary | ICD-10-CM

## 2021-02-21 DIAGNOSIS — E781 Pure hyperglyceridemia: Secondary | ICD-10-CM

## 2021-02-21 LAB — LIPID PANEL
Cholesterol: 116 mg/dL (ref <200)
HDL: 37 mg/dL — ABNORMAL LOW (ref >=40)
LDL Cholesterol (Calc): 51 mg/dL (calc)
Non-HDL Cholesterol (Calc): 79 mg/dL (calc) (ref <130)
Total CHOL/HDL Ratio: 3.1 (calc) (ref <5.0)
Triglycerides: 226 mg/dL — ABNORMAL HIGH (ref <150)

## 2021-02-21 LAB — CBC WITH DIFFERENTIAL/PLATELET
Absolute Monocytes: 494 cells/uL (ref 200–950)
Basophils Absolute: 62 cells/uL (ref 0–200)
Basophils Relative: 1.2 %
Eosinophils Absolute: 120 cells/uL (ref 15–500)
Eosinophils Relative: 2.3 %
HCT: 42.7 % (ref 38.5–50.0)
Hemoglobin: 14 g/dL (ref 13.2–17.1)
Lymphs Abs: 1373 cells/uL (ref 850–3900)
MCH: 30.5 pg (ref 27.0–33.0)
MCHC: 32.8 g/dL (ref 32.0–36.0)
MCV: 93 fL (ref 80.0–100.0)
MPV: 12.1 fL (ref 7.5–12.5)
Monocytes Relative: 9.5 %
Neutro Abs: 3151 cells/uL (ref 1500–7800)
Neutrophils Relative %: 60.6 %
Platelets: 221 10*3/uL (ref 140–400)
RBC: 4.59 10*6/uL (ref 4.20–5.80)
RDW: 13.1 % (ref 11.0–15.0)
Total Lymphocyte: 26.4 %
WBC: 5.2 10*3/uL (ref 3.8–10.8)

## 2021-02-21 LAB — COMPREHENSIVE METABOLIC PANEL
AG Ratio: 1.5 (calc) (ref 1.0–2.5)
ALT: 12 U/L (ref 9–46)
AST: 15 U/L (ref 10–35)
Albumin: 4 g/dL (ref 3.6–5.1)
Alkaline phosphatase (APISO): 54 U/L (ref 35–144)
BUN/Creatinine Ratio: 23 (calc) — ABNORMAL HIGH (ref 6–22)
BUN: 16 mg/dL (ref 7–25)
CO2: 26 mmol/L (ref 20–32)
Calcium: 9.1 mg/dL (ref 8.6–10.3)
Chloride: 107 mmol/L (ref 98–110)
Creat: 0.69 mg/dL — ABNORMAL LOW (ref 0.70–1.28)
Globulin: 2.7 g/dL (calc) (ref 1.9–3.7)
Glucose, Bld: 118 mg/dL — ABNORMAL HIGH (ref 65–99)
Potassium: 4 mmol/L (ref 3.5–5.3)
Sodium: 140 mmol/L (ref 135–146)
Total Bilirubin: 0.4 mg/dL (ref 0.2–1.2)
Total Protein: 6.7 g/dL (ref 6.1–8.1)

## 2021-02-24 ENCOUNTER — Encounter: Payer: Self-pay | Admitting: Orthopedic Surgery

## 2021-02-24 ENCOUNTER — Ambulatory Visit (INDEPENDENT_AMBULATORY_CARE_PROVIDER_SITE_OTHER): Payer: Medicare PPO | Admitting: Orthopedic Surgery

## 2021-02-24 ENCOUNTER — Other Ambulatory Visit: Payer: Self-pay

## 2021-02-24 VITALS — BP 142/94 | HR 82 | Temp 97.7°F | Ht 70.0 in | Wt 214.8 lb

## 2021-02-24 DIAGNOSIS — Z9109 Other allergy status, other than to drugs and biological substances: Secondary | ICD-10-CM

## 2021-02-24 DIAGNOSIS — F99 Mental disorder, not otherwise specified: Secondary | ICD-10-CM

## 2021-02-24 DIAGNOSIS — Z1211 Encounter for screening for malignant neoplasm of colon: Secondary | ICD-10-CM | POA: Diagnosis not present

## 2021-02-24 DIAGNOSIS — M25512 Pain in left shoulder: Secondary | ICD-10-CM

## 2021-02-24 DIAGNOSIS — F5105 Insomnia due to other mental disorder: Secondary | ICD-10-CM

## 2021-02-24 DIAGNOSIS — I10 Essential (primary) hypertension: Secondary | ICD-10-CM

## 2021-02-24 DIAGNOSIS — Z6830 Body mass index (BMI) 30.0-30.9, adult: Secondary | ICD-10-CM

## 2021-02-24 DIAGNOSIS — G8929 Other chronic pain: Secondary | ICD-10-CM

## 2021-02-24 DIAGNOSIS — E781 Pure hyperglyceridemia: Secondary | ICD-10-CM

## 2021-02-24 MED ORDER — LORATADINE 10 MG PO TABS: ORAL_TABLET | ORAL | 3 refills | Status: DC

## 2021-02-24 MED ORDER — TRAZODONE HCL 100 MG PO TABS: 100.0000 mg | ORAL_TABLET | Freq: Every day | ORAL | 2 refills | Status: DC

## 2021-02-24 NOTE — Progress Notes (Signed)
Careteam: Patient Care Team: Yvonna Alanis, NP as PCP - General (Adult Health Nurse Practitioner) Katy Apo, MD as Consulting Physician (Ophthalmology)  Seen by: Windell Moulding, AGNP-C  PLACE OF SERVICE:  [place] Directive information    Allergies  Allergen Reactions   Celebrex [Celecoxib]    Erythromycin    Oxycontin [Oxycodone Hcl]    Penicillins    Tetracyclines & Related     Chief Complaint  Patient presents with   Medical Management of Chronic Issues    Patient presents today for a 3 month follow up.   Quality Metric Gaps    Per patients care gap he is due for      HPI: Patient is a 75 y.o. male seen today for medical management of chronic conditions.   Labs reviewed with patient.   Blood pressure elevated. He brought a cup of coffee with him today. Continues to take losartan daily. Tries to limit salt intake.   Still having trouble sleeping at night. He was prescribed Trazodone 150 mg last encounter, but refused to take it. He continues to take his wifes Trazodone 100 mg qhs. He has been attending Bible study and reading Bible every night. Reports reading Bible at night has helped him sleep. Requesting new prescription of Trazodone 100 mg.   Tries to follow meat and vegetable diet. Will go to [employer] twice a week. Eats fish about 3-4 times weekly. Does not drink alcohol or drink soda. He states he does not consume a lot of starches or carbs.   Colonoscopy discussed with patient. He would like referral to have procedure done.   Left shoulder continues to be painful. Pain with movement. He does not take any medicine for pain. He will do PT exercises to help with stiffness.   He plans on getting flu vaccine.   He does not plan to get second covid booster at this time.        Review of Systems:  Review of Systems  HENT:  Negative for hearing loss.   Respiratory:  Negative for cough, shortness of breath and wheezing.    Cardiovascular:  Negative for chest pain and leg swelling.  Gastrointestinal:  Negative for abdominal pain, blood in stool, constipation, diarrhea, heartburn, nausea and vomiting.  Genitourinary:  Negative for dysuria and hematuria.  Musculoskeletal:  Negative for falls.       Left shoulder pain  Neurological:  Negative for dizziness, weakness and headaches.  Psychiatric/Behavioral:  Negative for depression. The patient has insomnia. The patient is not nervous/anxious.    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    Flatulence, eructation, and gas pain    Headache(784.0)    Hemorrhoids, external    Herpes zoster with unspecified complication    Hyperpotassemia    Insomnia, unspecified    Lumbago    Lumbago    Myalgia and myositis, unspecified    Nocturia    Obesity, unspecified    Osteoarthrosis, unspecified whether generalized or localized, unspecified site    Other abnormal blood chemistry    Other headache syndromes(339.89)    Pure hyperglyceridemia  Rash and other nonspecific skin eruption    Rotator cuff (capsule) sprain    Routine general medical examination at a health care facility    Tobacco use disorder    Unspecified essential hypertension    Unspecified hypertrophic and atrophic condition of skin    Unspecified sleep apnea    Past Surgical History:  Procedure Laterality Date   HEMORRHOID SURGERY     VARICOSE VEIN SURGERY     Social History:   reports that he quit smoking about 5 years ago. His smoking use included cigarettes. He has a 82.50 pack-year smoking history. He has never used smokeless tobacco. He reports that he does not  drink alcohol and does not use drugs.  Family History  Problem Relation Age of Onset   Diabetes Mother    Heart disease Mother    Stroke Mother    Stroke Father     Medications: Patient's Medications  New Prescriptions   No medications on file  Previous Medications   LORATADINE (CLARITIN) 10 MG TABLET    TAKE 1 TABLET BY MOUTH DAILY AS NEEDED FOR ALLERGIES   LOSARTAN (COZAAR) 50 MG TABLET    TAKE 1 TABLET(50 MG) BY MOUTH DAILY FOR BLOOD PRESSURE   SIMVASTATIN (ZOCOR) 20 MG TABLET    Take 1 tablet (20 mg total) by mouth daily at 6 PM.   TRAZODONE (DESYREL) 150 MG TABLET    Take 1 tablet (150 mg total) by mouth at bedtime.  Modified Medications   No medications on file  Discontinued Medications   MULTIPLE VITAMIN (MULTIVITAMIN PO)    Take 1 tablet by mouth daily.    Physical Exam:  Vitals:   02/24/21 0820  BP: (!) 142/94  Pulse: 82  Temp: 97.7 F (36.5 C)  TempSrc: Temporal  SpO2: 97%  Weight: 214 lb 12.8 oz (97.4 kg)  Height: '5\' 10"'$  (1.778 m)   Body mass index is 30.82 kg/m. Wt Readings from Last 3 Encounters:  02/24/21 214 lb 12.8 oz (97.4 kg)  12/31/20 212 lb 9.6 oz (96.4 kg)  11/26/20 210 lb (95.3 kg)    Physical Exam Vitals reviewed.  Constitutional:      General: He is not in acute distress. HENT:     Head: Normocephalic.     Nose: Nose normal.     Mouth/Throat:     Mouth: Mucous membranes are moist.  Eyes:     General:        Right eye: No discharge.        Left eye: No discharge.  Neck:     Vascular: No carotid bruit.  Cardiovascular:     Rate and Rhythm: Normal rate and regular rhythm.     Pulses: Normal pulses.     Heart sounds: Normal heart sounds. No murmur heard. Pulmonary:     Effort: Pulmonary effort is normal. No respiratory distress.     Breath sounds: Normal breath sounds. No wheezing.  Abdominal:     General: Bowel sounds are normal. There is no distension.     Palpations: Abdomen is soft.     Tenderness: There is no abdominal  tenderness.  Musculoskeletal:     Left shoulder: No swelling, deformity or tenderness. Decreased range of motion. Normal strength.     Cervical back: Normal range of motion.     Right lower leg: No edema.     Left lower leg: No edema.     Comments: Left shoulder with limited ROM, pain with abduction.  Lymphadenopathy:     Cervical: No cervical adenopathy.  Skin:    General: Skin is warm and dry.     Capillary Refill: Capillary refill takes less than 2 seconds.  Neurological:     General: No focal deficit present.     Mental Status: He is alert and oriented to person, place, and time.     Motor: No weakness.     Gait: Gait normal.  Psychiatric:        Mood and Affect: Mood normal.        Behavior: Behavior normal.    Labs reviewed: Basic Metabolic Panel: Recent Labs    08/13/20 0901 11/12/20 1158 02/21/21 0842  NA 140 137 140  K 4.5 4.6 4.0  CL 105 105 107  CO2 '29 23 26  '$ GLUCOSE 102* 96 118*  BUN '15 16 16  '$ CREATININE 0.68* 0.81 0.69*  CALCIUM 9.3 9.2 9.1   Liver Function Tests: Recent Labs    08/13/20 0901 11/12/20 1158 02/21/21 0842  AST '15 18 15  '$ ALT '10 12 12  '$ BILITOT 0.6 0.4 0.4  PROT 6.7 6.8 6.7   No results for input(s): LIPASE, AMYLASE in the last 8760 hours. No results for input(s): AMMONIA in the last 8760 hours. CBC: Recent Labs    08/13/20 0901 11/12/20 1158 02/21/21 0842  WBC 4.9 5.3 5.2  NEUTROABS 3,023 3,212 3,151  HGB 15.1 14.3 14.0  HCT 45.3 44.0 42.7  MCV 94.4 94.6 93.0  PLT 189 223 221   Lipid Panel: Recent Labs    08/13/20 0901 11/12/20 1158 02/21/21 0842  CHOL 121 116 116  HDL 37* 37* 37*  LDLCALC 63 58 51  TRIG 127 124 226*  CHOLHDL 3.3 3.1 3.1   TSH: No results for input(s): TSH in the last 8760 hours. 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  Dispense: 90 tablet; Refill: 2  2. Encounter for screening colonoscopy - last done 08/2020 - he denies blood in stool or changes in bowel  - discussed pro/cons of procedure- he would like to proceed with scheduling - Ambulatory referral to Gastroenterology  3. Environmental allergies - stable with daily medication - loratadine (CLARITIN) 10 MG tablet; TAKE 1 TABLET BY MOUTH DAILY AS NEEDED FOR ALLERGIES  Dispense: 90 tablet; Refill: 3  4. Essential hypertension - controlled - slightly elevated due to drinking coffee during encounter - BUN/creat 16/0.69 02/21/2021 - cont losartan 50 mg daily - CBC with Differential/Platelet; Future - CMP; Future  5. Hypertriglyceridemia - triglycerides increased to 226 (02/21/2021) was 124 (11/12/2020) - recommend limiting starches and carbs in diet - Lipid Panel; Future  6. Chronic left shoulder pain - does not take medication for pain - cont PT exercises  7. BMI 30.0-30.9,adult - gained 4 lbs from last follow up - recommend limiting calories to about < 2000 daily - recommend light exercise like walking 150 min/week - Hemoglobin A1c; Future   Total time: 32 minutes. Greater than 50% of total time spent doing patient education discussing health maintenance, colonoscopy and diet and exercise.   Next appt: 06/09/2021  Windell Moulding, [facility] [phone]

## 2021-02-24 NOTE — Patient Instructions (Signed)
Please schedule colonoscopy- [facility] will call you  Please get flu vaccine in September  New prescription for Trazodone 100 mg sent to your pharmacy

## 2021-04-05 ENCOUNTER — Other Ambulatory Visit: Payer: Self-pay | Admitting: *Deleted

## 2021-04-05 DIAGNOSIS — E781 Pure hyperglyceridemia: Secondary | ICD-10-CM

## 2021-04-05 DIAGNOSIS — R739 Hyperglycemia, unspecified: Secondary | ICD-10-CM

## 2021-04-05 MED ORDER — SIMVASTATIN 20 MG PO TABS: 20.0000 mg | ORAL_TABLET | Freq: Every day | ORAL | 1 refills | Status: DC

## 2021-04-05 NOTE — Telephone Encounter (Signed)
Patient requested refill

## 2021-04-06 ENCOUNTER — Other Ambulatory Visit: Payer: Self-pay | Admitting: Orthopedic Surgery

## 2021-04-06 DIAGNOSIS — E781 Pure hyperglyceridemia: Secondary | ICD-10-CM

## 2021-04-06 DIAGNOSIS — R739 Hyperglycemia, unspecified: Secondary | ICD-10-CM

## 2021-05-10 DIAGNOSIS — R21 Rash and other nonspecific skin eruption: Secondary | ICD-10-CM | POA: Diagnosis not present

## 2021-05-10 DIAGNOSIS — J309 Allergic rhinitis, unspecified: Secondary | ICD-10-CM | POA: Diagnosis not present

## 2021-05-10 DIAGNOSIS — H1045 Other chronic allergic conjunctivitis: Secondary | ICD-10-CM | POA: Diagnosis not present

## 2021-05-10 DIAGNOSIS — R634 Abnormal weight loss: Secondary | ICD-10-CM | POA: Diagnosis not present

## 2021-05-10 HISTORY — PX: DENTAL SURGERY: SHX609

## 2021-06-06 ENCOUNTER — Other Ambulatory Visit: Payer: Medicare PPO

## 2021-06-06 ENCOUNTER — Other Ambulatory Visit: Payer: Self-pay

## 2021-06-06 DIAGNOSIS — E781 Pure hyperglyceridemia: Secondary | ICD-10-CM

## 2021-06-06 DIAGNOSIS — D696 Thrombocytopenia, unspecified: Secondary | ICD-10-CM | POA: Diagnosis not present

## 2021-06-06 DIAGNOSIS — I10 Essential (primary) hypertension: Secondary | ICD-10-CM | POA: Diagnosis not present

## 2021-06-06 DIAGNOSIS — Z6830 Body mass index (BMI) 30.0-30.9, adult: Secondary | ICD-10-CM | POA: Diagnosis not present

## 2021-06-07 LAB — CBC WITH DIFFERENTIAL/PLATELET
Absolute Monocytes: 592 cells/uL (ref 200–950)
Basophils Absolute: 61 cells/uL (ref 0–200)
Basophils Relative: 1 %
Eosinophils Absolute: 92 cells/uL (ref 15–500)
Eosinophils Relative: 1.5 %
HCT: 41.4 % (ref 38.5–50.0)
Hemoglobin: 13.9 g/dL (ref 13.2–17.1)
Lymphs Abs: 1440 cells/uL (ref 850–3900)
MCH: 31.2 pg (ref 27.0–33.0)
MCHC: 33.6 g/dL (ref 32.0–36.0)
MCV: 93 fL (ref 80.0–100.0)
MPV: 12 fL (ref 7.5–12.5)
Monocytes Relative: 9.7 %
Neutro Abs: 3916 cells/uL (ref 1500–7800)
Neutrophils Relative %: 64.2 %
Platelets: 218 10*3/uL (ref 140–400)
RBC: 4.45 10*6/uL (ref 4.20–5.80)
RDW: 12.6 % (ref 11.0–15.0)
Total Lymphocyte: 23.6 %
WBC: 6.1 10*3/uL (ref 3.8–10.8)

## 2021-06-07 LAB — COMPREHENSIVE METABOLIC PANEL
AG Ratio: 1.4 (calc) (ref 1.0–2.5)
ALT: 13 U/L (ref 9–46)
AST: 16 U/L (ref 10–35)
Albumin: 4.2 g/dL (ref 3.6–5.1)
Alkaline phosphatase (APISO): 57 U/L (ref 35–144)
BUN: 18 mg/dL (ref 7–25)
CO2: 26 mmol/L (ref 20–32)
Calcium: 9.2 mg/dL (ref 8.6–10.3)
Chloride: 106 mmol/L (ref 98–110)
Creat: 0.79 mg/dL (ref 0.70–1.28)
Globulin: 3 g/dL (calc) (ref 1.9–3.7)
Glucose, Bld: 121 mg/dL — ABNORMAL HIGH (ref 65–99)
Potassium: 4.2 mmol/L (ref 3.5–5.3)
Sodium: 139 mmol/L (ref 135–146)
Total Bilirubin: 0.4 mg/dL (ref 0.2–1.2)
Total Protein: 7.2 g/dL (ref 6.1–8.1)

## 2021-06-07 LAB — LIPID PANEL
Cholesterol: 119 mg/dL (ref <200)
HDL: 36 mg/dL — ABNORMAL LOW (ref >=40)
LDL Cholesterol (Calc): 61 mg/dL (calc)
Non-HDL Cholesterol (Calc): 83 mg/dL (calc) (ref <130)
Total CHOL/HDL Ratio: 3.3 (calc) (ref <5.0)
Triglycerides: 141 mg/dL (ref <150)

## 2021-06-07 LAB — HEMOGLOBIN A1C
Hgb A1c MFr Bld: 5.8 % of total Hgb — ABNORMAL HIGH (ref <5.7)
Mean Plasma Glucose: 120 mg/dL
eAG (mmol/L): 6.6 mmol/L

## 2021-06-09 ENCOUNTER — Other Ambulatory Visit: Payer: Self-pay

## 2021-06-09 ENCOUNTER — Ambulatory Visit (INDEPENDENT_AMBULATORY_CARE_PROVIDER_SITE_OTHER): Payer: Medicare PPO | Admitting: Orthopedic Surgery

## 2021-06-09 ENCOUNTER — Encounter: Payer: Self-pay | Admitting: Orthopedic Surgery

## 2021-06-09 VITALS — BP 130/80 | HR 78 | Temp 97.8°F | Resp 16 | Ht 70.0 in | Wt 218.0 lb

## 2021-06-09 DIAGNOSIS — Z23 Encounter for immunization: Secondary | ICD-10-CM

## 2021-06-09 DIAGNOSIS — F5105 Insomnia due to other mental disorder: Secondary | ICD-10-CM

## 2021-06-09 DIAGNOSIS — R7303 Prediabetes: Secondary | ICD-10-CM | POA: Diagnosis not present

## 2021-06-09 DIAGNOSIS — K0889 Other specified disorders of teeth and supporting structures: Secondary | ICD-10-CM | POA: Diagnosis not present

## 2021-06-09 DIAGNOSIS — E781 Pure hyperglyceridemia: Secondary | ICD-10-CM | POA: Diagnosis not present

## 2021-06-09 DIAGNOSIS — Z9109 Other allergy status, other than to drugs and biological substances: Secondary | ICD-10-CM | POA: Diagnosis not present

## 2021-06-09 DIAGNOSIS — M25512 Pain in left shoulder: Secondary | ICD-10-CM | POA: Diagnosis not present

## 2021-06-09 DIAGNOSIS — I10 Essential (primary) hypertension: Secondary | ICD-10-CM

## 2021-06-09 DIAGNOSIS — G8929 Other chronic pain: Secondary | ICD-10-CM

## 2021-06-09 DIAGNOSIS — Z6830 Body mass index (BMI) 30.0-30.9, adult: Secondary | ICD-10-CM | POA: Diagnosis not present

## 2021-06-09 DIAGNOSIS — F99 Mental disorder, not otherwise specified: Secondary | ICD-10-CM

## 2021-06-09 NOTE — Progress Notes (Signed)
Careteam: Patient Care Team: Yvonna Alanis, NP as PCP - General (Adult Health Nurse Practitioner) Katy Apo, MD as Consulting Physician (Ophthalmology)  Seen by: Windell Moulding, AGNP-C  PLACE OF SERVICE:  [place]  [place] Directive information    Allergies  Allergen Reactions   Celebrex [Celecoxib]    Erythromycin    Oxycontin [Oxycodone Hcl]    Penicillins    Tetracyclines & Related     No chief complaint on file.    HPI: Patient is a 75 y.o. male seen today for medical management of chronic conditions.   Labs discussed with patient.   A1c slightly elevated. He likes to go to [employer] with wife twice a week. Eating salmon, spinach, spring rolls, and  potatoes often. He is drinking Sprite daily.   Does not exercise at this time.   He is in the process of getting dentures. He had the rest of his teeth pulled yesterday. He is currently taking ibuprofen and tylenol for pain. He will be getting dentures in 2 weeks.   Blood pressure- does not check pressure at home. Continues to take losartan daily. Denies chest pain, sob, blurred vision, and headaches.   Insomnia- sleeping about 6-8 hours a night with Trazodone.   Colonoscopy referral- did not follow up to have study done. Denies changes to bowel habits.   Left shoulder pain- pain intermittent. He has seen Dr. Durward Fortes earlier this year, CT recommended but he is claustrophobic and would not do procedure.   No recent falls or injuries.    Review of Systems:  Review of Systems  Constitutional:  Negative for chills, fever, malaise/fatigue and weight loss.  HENT:  Negative for hearing loss and sore throat.        Dental pain  Eyes:  Negative for blurred vision and double vision.       Glasses  Respiratory:  Negative for cough, shortness of breath and wheezing.   Cardiovascular:  Negative for chest pain and leg swelling.  Gastrointestinal:  Negative for abdominal pain, blood in stool, constipation,  diarrhea, heartburn, nausea and vomiting.  Genitourinary:  Negative for dysuria, frequency and hematuria.  Musculoskeletal:  Negative for falls.       Left shoulder pain  Skin:        Red spots on arms  Neurological:  Negative for dizziness, weakness and headaches.  Psychiatric/Behavioral:  Negative for depression and memory loss. The patient has insomnia. The patient is not nervous/anxious.    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    Flatulence, eructation, and gas pain    Headache(784.0)    Hemorrhoids, external    Herpes zoster with unspecified complication    Hyperpotassemia    Insomnia, unspecified    Lumbago    Lumbago    Myalgia and myositis, unspecified    Nocturia    Obesity, unspecified    Osteoarthrosis, unspecified whether generalized or localized, unspecified site    Other abnormal blood chemistry    Other headache syndromes(339.89)    Pure hyperglyceridemia    Rash and other nonspecific skin eruption    Rotator cuff (capsule) sprain  Routine general medical examination at a health care facility    Tobacco use disorder    Unspecified essential hypertension    Unspecified hypertrophic and atrophic condition of skin    Unspecified sleep apnea    Past Surgical History:  Procedure Laterality Date   HEMORRHOID SURGERY     VARICOSE VEIN SURGERY     Social History:   reports that he quit smoking about 5 years ago. His smoking use included cigarettes. He has a 82.50 pack-year smoking history. He has never used smokeless tobacco. He reports that he does not drink alcohol and does not use drugs.  Family History  Problem Relation Age of  Onset   Diabetes Mother    Heart disease Mother    Stroke Mother    Stroke Father     Medications: Patient's Medications  New Prescriptions   No medications on file  Previous Medications   LORATADINE (CLARITIN) 10 MG TABLET    TAKE 1 TABLET BY MOUTH DAILY AS NEEDED FOR ALLERGIES   LOSARTAN (COZAAR) 50 MG TABLET    TAKE 1 TABLET(50 MG) BY MOUTH DAILY FOR BLOOD PRESSURE   SIMVASTATIN (ZOCOR) 20 MG TABLET    Take 1 tablet (20 mg total) by mouth daily at 6 PM.   TRAZODONE (DESYREL) 100 MG TABLET    Take 1 tablet (100 mg total) by mouth at bedtime.  Modified Medications   No medications on file  Discontinued Medications   No medications on file    Physical Exam:  There were no vitals filed for this visit. There is no height or weight on file to calculate BMI. Wt Readings from Last 3 Encounters:  02/24/21 214 lb 12.8 oz (97.4 kg)  12/31/20 212 lb 9.6 oz (96.4 kg)  11/26/20 210 lb (95.3 kg)    Physical Exam Vitals reviewed.  Constitutional:      General: He is not in acute distress. HENT:     Head: Normocephalic.  Eyes:     General:        Right eye: No discharge.        Left eye: No discharge.  Neck:     Vascular: No carotid bruit.  Cardiovascular:     Rate and Rhythm: Normal rate and regular rhythm.     Pulses: Normal pulses.     Heart sounds: Normal heart sounds. No murmur heard. Pulmonary:     Effort: Pulmonary effort is normal. No respiratory distress.     Breath sounds: Normal breath sounds. No wheezing.  Abdominal:     General: Bowel sounds are normal. There is no distension.     Palpations: Abdomen is soft.     Tenderness: There is no abdominal tenderness.  Musculoskeletal:     Cervical back: Normal range of motion.     Right lower leg: No edema.     Left lower leg: No edema.  Lymphadenopathy:     Cervical: No cervical adenopathy.  Skin:    General: Skin is warm and dry.     Capillary Refill: Capillary refill takes less than 2 seconds.  Neurological:      General: No focal deficit present.     Mental Status: He is alert and oriented to person, place, and time.  Psychiatric:        Mood and Affect: Mood normal.        Behavior: Behavior normal.    Labs reviewed: Basic Metabolic Panel: Recent Labs    11/12/20 1158 02/21/21 0842 06/06/21 0846  NA 137 140 139  K 4.6 4.0 4.2  CL 105 107 106  CO2 23 26 26   GLUCOSE 96 118* 121*  BUN 16 16 18   CREATININE 0.81 0.69* 0.79  CALCIUM 9.2 9.1 9.2   Liver Function Tests: Recent Labs    11/12/20 1158 02/21/21 0842 06/06/21 0846  AST 18 15 16   ALT 12 12 13   BILITOT 0.4 0.4 0.4  PROT 6.8 6.7 7.2   No results for input(s): LIPASE, AMYLASE in the last 8760 hours. No results for input(s): AMMONIA in the last 8760 hours. CBC: Recent Labs    11/12/20 1158 02/21/21 0842 06/06/21 0846  WBC 5.3 5.2 6.1  NEUTROABS 3,212 3,151 3,916  HGB 14.3 14.0 13.9  HCT 44.0 42.7 41.4  MCV 94.6 93.0 93.0  PLT 223 221 218   Lipid Panel: Recent Labs    11/12/20 1158 02/21/21 0842 06/06/21 0846  CHOL 116 116 119  HDL 37* 37* 36*  LDLCALC 58 51 61  TRIG 124 226* 141  CHOLHDL 3.1 3.1 3.3   TSH: No results for input(s): TSH in the last 8760 hours. A1C: Lab Results  Component Value Date   HGBA1C 5.8 (H) 06/06/2021     Assessment/Plan 1. Need for influenza vaccination - Flu Vaccine QUAD High Dose(Fluad)  2. Prediabetes - A1c 5.8 06/06/2021 - admits to drinking soda - recommend dietary changes- advised to stop drinking soda - A1C- future  3. Essential hypertension - controlled - cont losartan  4. Hypertriglyceridemia - Triglycerides 141 06/06/2021  5. Chronic left shoulder pain - followed by Dr. Durward Fortes - possible rotator cuff tear - does not want aggressive testing or interventions  6. BMI 30.0-30.9,adult - weight increased 4 lbs  - advised to exercise 150 minutes/weekly  7. Environmental allergies - stable with Claritin  8. Insomnia due to other mental  disorder - sleeping 6-8 hours a night - cont trazodone  9. Pain, dental - teeth pulled yesterday - plans to get fitted for Dentures in 2 weeks - cont tylenol and ibuprofen for pain   Total time: 31 minutes. Greater than 50% of total time spent doing patient education regarding prediabetes- diet and weight loss.   Next appt: 10/13/2021  Windell Moulding, [facility] [phone]

## 2021-06-09 NOTE — Patient Instructions (Signed)
Try to cut carbs and sugars from diet- cut down on soda

## 2021-09-28 ENCOUNTER — Other Ambulatory Visit: Payer: Self-pay | Admitting: Orthopedic Surgery

## 2021-09-28 DIAGNOSIS — R7303 Prediabetes: Secondary | ICD-10-CM

## 2021-09-28 DIAGNOSIS — E781 Pure hyperglyceridemia: Secondary | ICD-10-CM

## 2021-09-28 DIAGNOSIS — I10 Essential (primary) hypertension: Secondary | ICD-10-CM

## 2021-10-10 ENCOUNTER — Other Ambulatory Visit: Payer: Medicare PPO

## 2021-10-10 DIAGNOSIS — R7303 Prediabetes: Secondary | ICD-10-CM | POA: Diagnosis not present

## 2021-10-10 DIAGNOSIS — E781 Pure hyperglyceridemia: Secondary | ICD-10-CM

## 2021-10-10 DIAGNOSIS — I10 Essential (primary) hypertension: Secondary | ICD-10-CM

## 2021-10-11 LAB — LIPID PANEL
Cholesterol: 170 mg/dL (ref <200)
HDL: 37 mg/dL — ABNORMAL LOW (ref >=40)
LDL Cholesterol (Calc): 97 mg/dL (calc)
Non-HDL Cholesterol (Calc): 133 mg/dL (calc) — ABNORMAL HIGH (ref <130)
Total CHOL/HDL Ratio: 4.6 (calc) (ref <5.0)
Triglycerides: 253 mg/dL — ABNORMAL HIGH (ref <150)

## 2021-10-11 LAB — CBC WITH DIFFERENTIAL/PLATELET
Absolute Monocytes: 618 cells/uL (ref 200–950)
Basophils Absolute: 59 cells/uL (ref 0–200)
Basophils Relative: 0.9 %
Eosinophils Absolute: 111 cells/uL (ref 15–500)
Eosinophils Relative: 1.7 %
HCT: 44.1 % (ref 38.5–50.0)
Hemoglobin: 14.6 g/dL (ref 13.2–17.1)
Lymphs Abs: 1612 cells/uL (ref 850–3900)
MCH: 30.7 pg (ref 27.0–33.0)
MCHC: 33.1 g/dL (ref 32.0–36.0)
MCV: 92.8 fL (ref 80.0–100.0)
MPV: 11.9 fL (ref 7.5–12.5)
Monocytes Relative: 9.5 %
Neutro Abs: 4102 cells/uL (ref 1500–7800)
Neutrophils Relative %: 63.1 %
Platelets: 233 10*3/uL (ref 140–400)
RBC: 4.75 10*6/uL (ref 4.20–5.80)
RDW: 13 % (ref 11.0–15.0)
Total Lymphocyte: 24.8 %
WBC: 6.5 10*3/uL (ref 3.8–10.8)

## 2021-10-11 LAB — COMPREHENSIVE METABOLIC PANEL
AG Ratio: 1.3 (calc) (ref 1.0–2.5)
ALT: 11 U/L (ref 9–46)
AST: 14 U/L (ref 10–35)
Albumin: 4.1 g/dL (ref 3.6–5.1)
Alkaline phosphatase (APISO): 60 U/L (ref 35–144)
BUN/Creatinine Ratio: 24 (calc) — ABNORMAL HIGH (ref 6–22)
BUN: 16 mg/dL (ref 7–25)
CO2: 30 mmol/L (ref 20–32)
Calcium: 9.3 mg/dL (ref 8.6–10.3)
Chloride: 107 mmol/L (ref 98–110)
Creat: 0.67 mg/dL — ABNORMAL LOW (ref 0.70–1.28)
Globulin: 3.1 g/dL (calc) (ref 1.9–3.7)
Glucose, Bld: 131 mg/dL — ABNORMAL HIGH (ref 65–99)
Potassium: 5 mmol/L (ref 3.5–5.3)
Sodium: 143 mmol/L (ref 135–146)
Total Bilirubin: 0.4 mg/dL (ref 0.2–1.2)
Total Protein: 7.2 g/dL (ref 6.1–8.1)

## 2021-10-11 LAB — HEMOGLOBIN A1C
Hgb A1c MFr Bld: 6 % of total Hgb — ABNORMAL HIGH (ref <5.7)
Mean Plasma Glucose: 126 mg/dL
eAG (mmol/L): 7 mmol/L

## 2021-10-12 ENCOUNTER — Encounter: Payer: Self-pay | Admitting: Orthopedic Surgery

## 2021-10-13 ENCOUNTER — Other Ambulatory Visit: Payer: Self-pay | Admitting: Orthopedic Surgery

## 2021-10-13 ENCOUNTER — Ambulatory Visit (INDEPENDENT_AMBULATORY_CARE_PROVIDER_SITE_OTHER): Payer: Medicare PPO | Admitting: Orthopedic Surgery

## 2021-10-13 ENCOUNTER — Encounter: Payer: Self-pay | Admitting: Orthopedic Surgery

## 2021-10-13 VITALS — BP 118/78 | HR 96 | Temp 97.5°F | Ht 70.0 in | Wt 216.4 lb

## 2021-10-13 DIAGNOSIS — M25512 Pain in left shoulder: Secondary | ICD-10-CM

## 2021-10-13 DIAGNOSIS — I10 Essential (primary) hypertension: Secondary | ICD-10-CM

## 2021-10-13 DIAGNOSIS — E781 Pure hyperglyceridemia: Secondary | ICD-10-CM

## 2021-10-13 DIAGNOSIS — R7303 Prediabetes: Secondary | ICD-10-CM

## 2021-10-13 DIAGNOSIS — F5105 Insomnia due to other mental disorder: Secondary | ICD-10-CM | POA: Diagnosis not present

## 2021-10-13 DIAGNOSIS — J302 Other seasonal allergic rhinitis: Secondary | ICD-10-CM | POA: Diagnosis not present

## 2021-10-13 DIAGNOSIS — G8929 Other chronic pain: Secondary | ICD-10-CM | POA: Diagnosis not present

## 2021-10-13 DIAGNOSIS — F99 Mental disorder, not otherwise specified: Secondary | ICD-10-CM | POA: Diagnosis not present

## 2021-10-13 DIAGNOSIS — Z6830 Body mass index (BMI) 30.0-30.9, adult: Secondary | ICD-10-CM | POA: Diagnosis not present

## 2021-10-13 MED ORDER — SIMVASTATIN 20 MG PO TABS: 20.0000 mg | ORAL_TABLET | Freq: Every day | ORAL | 1 refills | Status: DC

## 2021-10-13 MED ORDER — TRAZODONE HCL 100 MG PO TABS: 100.0000 mg | ORAL_TABLET | Freq: Every day | ORAL | 2 refills | Status: DC

## 2021-10-13 NOTE — Patient Instructions (Signed)
Try to lose weight to help keep you prediabetic. Would like you to lose 20 lbs.  ? ?Limit diet in carbs (breads/rice/pasta) and sugars in diet.  ?

## 2021-10-13 NOTE — Progress Notes (Signed)
? ? ?Careteam: ?Patient Care Team: ?Yvonna Alanis, NP as PCP - General (Adult Health Nurse Practitioner) ?Katy Apo, MD as Consulting Physician (Ophthalmology) ? ?Seen by: Windell Moulding, AGNP-C ? ?PLACE OF SERVICE:  ?[facility]  ?Advanced Directive information ?Does Patient Have a Medical Advance Directive?: No, Would patient like information on creating a medical advance directive?: No - Patient declined ? ?Allergies  ?Allergen Reactions  ? Celebrex [Celecoxib]   ? Erythromycin   ? Oxycodone Hcl Other (See Comments)  ? Penicillins   ? Tetracyclines & Related   ? ? ?Chief Complaint  ?Patient presents with  ? Medical Management of Chronic Issues  ?  Patient present today for 4 month follow up and review labs.   ? ? ? ?HPI: Patient is a 76 y.o. male seen today for medical management of chronic conditions.  ? ?Lab work discussed with patient.  ? ?No health concerns today.  ? ?Stopped taking simvastatin. He wanted to see if he still needed medication. Asking for new prescription.  ? ?All teeth have been removed. No complications. Top dentures fitting well. He is still trying to have bottom dentures fit right. Eating soft foods at this time.  ? ?Still taking trazodone for sleep.  ? ?No recent falls or injuries. Ambulates on own.  ? ?Does not plan to have future colonoscopies. Denies changes in bowel habits ? ? ? ?Review of Systems:  ?Review of Systems  ?Constitutional:  Negative for chills, fever, malaise/fatigue and weight loss.  ?HENT:  Negative for hearing loss and sore throat.   ?Eyes:  Negative for blurred vision and double vision.  ?Respiratory:  Negative for cough, shortness of breath and wheezing.   ?Cardiovascular:  Negative for chest pain and leg swelling.  ?Gastrointestinal:  Negative for abdominal pain, blood in stool, constipation, diarrhea, heartburn, nausea and vomiting.  ?Genitourinary:  Negative for frequency and hematuria.  ?Musculoskeletal:  Positive for joint pain. Negative for falls.  ?Neurological:   Negative for dizziness, weakness and headaches.  ?Psychiatric/Behavioral:  Positive for depression. Negative for memory loss. The patient has insomnia. The patient is not nervous/anxious.   ? ?Past Medical History:  ?Diagnosis Date  ? Allergic rhinitis due to pollen   ? Anxiety state, unspecified   ? Asymptomatic varicose veins   ? Atherosclerosis of native arteries of the extremities, unspecified   ? Cataract 10/17/2016  ? L Eye  ? Contact dermatitis and other eczema due to other chemical products   ? 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 Flatulence, eructation, and gas pain   ? Headache(784.0)   ? Hemorrhoids, external   ? Herpes zoster with unspecified complication   ? Hyperpotassemia   ? Insomnia, unspecified   ? Lumbago   ? Lumbago   ? Myalgia and myositis, unspecified   ? Nocturia   ? Obesity, unspecified   ? Osteoarthrosis, unspecified whether generalized or localized, unspecified site   ? Other abnormal blood chemistry   ? Other headache syndromes(339.89)   ? Pure hyperglyceridemia   ? Rash and other nonspecific skin eruption   ? Rotator cuff (capsule) sprain   ? Routine general medical examination at a health care facility   ? Tobacco use disorder   ? Unspecified essential hypertension   ? Unspecified hypertrophic and atrophic condition of skin   ? Unspecified sleep apnea   ? ?Past Surgical History:  ?Procedure Laterality Date  ? HEMORRHOID SURGERY    ? VARICOSE VEIN SURGERY    ? ?  Social History: ?  reports that he quit smoking about 6 years ago. His smoking use included cigarettes. He has a 82.50 pack-year smoking history. He has never used smokeless tobacco. He reports that he does not drink alcohol and does not use drugs. ? ?Family History  ?Problem Relation Age of Onset  ? Diabetes Mother   ? Heart disease Mother   ? Stroke Mother   ? Stroke Father   ? ? ?Medications: ?Patient's Medications  ?New Prescriptions  ? No medications on file  ?Previous Medications  ?  ACETAMINOPHEN (TYLENOL) 500 MG TABLET    Take 1,000 mg by mouth every 6 (six) hours as needed.  ? IBUPROFEN (ADVIL) 200 MG TABLET    Take 400 mg by mouth every 6 (six) hours as needed.  ? LORATADINE (CLARITIN) 10 MG TABLET    TAKE 1 TABLET BY MOUTH DAILY AS NEEDED FOR ALLERGIES  ? LOSARTAN (COZAAR) 50 MG TABLET    TAKE 1 TABLET(50 MG) BY MOUTH DAILY FOR BLOOD PRESSURE  ? SIMVASTATIN (ZOCOR) 20 MG TABLET    Take 1 tablet (20 mg total) by mouth daily at 6 PM.  ? TRAZODONE (DESYREL) 100 MG TABLET    Take 1 tablet (100 mg total) by mouth at bedtime.  ?Modified Medications  ? No medications on file  ?Discontinued Medications  ? No medications on file  ? ? ?Physical Exam: ? ?There were no vitals filed for this visit. ?There is no height or weight on file to calculate BMI. ?Wt Readings from Last 3 Encounters:  ?06/09/21 218 lb (98.9 kg)  ?02/24/21 214 lb 12.8 oz (97.4 kg)  ?12/31/20 212 lb 9.6 oz (96.4 kg)  ? ? ?Physical Exam ?Vitals reviewed.  ?Constitutional:   ?   General: He is not in acute distress. ?HENT:  ?   Head: Normocephalic.  ?Eyes:  ?   General:     ?   Right eye: No discharge.     ?   Left eye: No discharge.  ?Neck:  ?   Thyroid: No thyroid mass, thyromegaly or thyroid tenderness.  ?Cardiovascular:  ?   Rate and Rhythm: Normal rate and regular rhythm.  ?   Pulses: Normal pulses.  ?   Heart sounds: Normal heart sounds. No murmur heard. ?Pulmonary:  ?   Effort: Pulmonary effort is normal. No respiratory distress.  ?   Breath sounds: Normal breath sounds. No wheezing.  ?Abdominal:  ?   General: Bowel sounds are normal. There is no distension.  ?   Palpations: Abdomen is soft.  ?   Tenderness: There is no abdominal tenderness.  ?Musculoskeletal:  ?   Right lower leg: No edema.  ?   Left lower leg: No edema.  ?Skin: ?   General: Skin is warm and dry.  ?   Capillary Refill: Capillary refill takes less than 2 seconds.  ?Neurological:  ?   General: No focal deficit present.  ?   Mental Status: He is alert and  oriented to person, place, and time.  ?Psychiatric:     ?   Mood and Affect: Mood normal.     ?   Behavior: Behavior normal.  ? ? ?Labs reviewed: ?Basic Metabolic Panel: ?Recent Labs  ?  02/21/21 ?0842 06/06/21 ?1287 10/10/21 ?0811  ?NA 140 139 143  ?K 4.0 4.2 5.0  ?CL 107 106 107  ?CO2 '26 26 30  '$ ?GLUCOSE 118* 121* 131*  ?BUN '16 18 16  '$ ?CREATININE 0.69* 0.79 0.67*  ?  CALCIUM 9.1 9.2 9.3  ? ?Liver Function Tests: ?Recent Labs  ?  02/21/21 ?0842 06/06/21 ?8616 10/10/21 ?0811  ?AST '15 16 14  '$ ?ALT '12 13 11  '$ ?BILITOT 0.4 0.4 0.4  ?PROT 6.7 7.2 7.2  ? ?No results for input(s): LIPASE, AMYLASE in the last 8760 hours. ?No results for input(s): AMMONIA in the last 8760 hours. ?CBC: ?Recent Labs  ?  02/21/21 ?0842 06/06/21 ?8372 10/10/21 ?0811  ?WBC 5.2 6.1 6.5  ?NEUTROABS 3,151 3,916 4,102  ?HGB 14.0 13.9 14.6  ?HCT 42.7 41.4 44.1  ?MCV 93.0 93.0 92.8  ?PLT 221 218 233  ? ?Lipid Panel: ?Recent Labs  ?  02/21/21 ?0842 06/06/21 ?9021 10/10/21 ?0811  ?CHOL 116 119 170  ?HDL 37* 36* 37*  ?JDBZMCE 02 23 36  ?TRIG 226* 141 253*  ?CHOLHDL 3.1 3.3 4.6  ? ?TSH: ?No results for input(s): TSH in the last 8760 hours. ?A1C: ?Lab Results  ?Component Value Date  ? HGBA1C 6.0 (H) 10/10/2021  ? ? ? ?Assessment/Plan ?1. Prediabetes ?- A1c 6.0 04/03 ?- cont ARB for kidney protection ?- discussed diet and exercise ?- goal to lose 20 lbs over summer ?- advised to limit diet in carbs and sugars ? ?2. Essential hypertension ?- controlled ?- cont losartan ? ?3. Hypertriglyceridemia ?- triglycerides 253 04/03 ?- stopped simvastatin ?- diet high in carbs due to recent dentures ?- simvastatin (ZOCOR) 20 MG tablet; Take 1 tablet (20 mg total) by mouth daily at 6 PM.  Dispense: 90 tablet; Refill: 1 ? ?4. Chronic left shoulder pain ?- cont tylenol prn for pain ?- does not wish for aggressive orthopedic workup ? ?5. BMI 30.0-30.9,adult ?- limit calories to < 2000/day ?- exercise 150 min/week ? ?6. Seasonal allergies ?- cont claritin ? 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  Dispense: 90 tablet; Refill: 2 ? ?Total time: 30 minutes. Greater than 50% of total time sp

## 2021-11-24 ENCOUNTER — Ambulatory Visit (INDEPENDENT_AMBULATORY_CARE_PROVIDER_SITE_OTHER): Payer: Medicare PPO | Admitting: Family

## 2021-11-24 ENCOUNTER — Encounter: Payer: Self-pay | Admitting: Family

## 2021-11-24 ENCOUNTER — Ambulatory Visit: Payer: Medicare PPO | Admitting: Orthopedic Surgery

## 2021-11-24 VITALS — BP 110/86 | HR 81 | Temp 97.6°F | Resp 16 | Ht 70.0 in | Wt 219.7 lb

## 2021-11-24 DIAGNOSIS — M545 Low back pain, unspecified: Secondary | ICD-10-CM | POA: Diagnosis not present

## 2021-11-24 DIAGNOSIS — R300 Dysuria: Secondary | ICD-10-CM

## 2021-11-24 DIAGNOSIS — R350 Frequency of micturition: Secondary | ICD-10-CM | POA: Diagnosis not present

## 2021-11-24 LAB — POCT URINALYSIS DIPSTICK
Bilirubin, UA: NEGATIVE
Glucose, UA: NEGATIVE
Ketones, UA: POSITIVE
Leukocytes, UA: NEGATIVE
Nitrite, UA: NEGATIVE
Protein, UA: POSITIVE — AB
Spec Grav, UA: >=1.03 — AB (ref 1.010–1.025)
Urobilinogen, UA: NEGATIVE E.U./dL — AB
pH, UA: 5 (ref 5.0–8.0)

## 2021-11-24 MED ORDER — CRANBERRY 475 MG PO CAPS: 475.0000 mg | ORAL_CAPSULE | Freq: Two times a day (BID) | ORAL | 0 refills | Status: AC

## 2021-11-24 NOTE — Progress Notes (Signed)
Provider: Terran Klinke FNP-C  David Alanis, NP  Patient Care Team: David Alanis, NP as PCP - General (Adult Health Nurse Practitioner) Katy Apo, MD as Consulting Physician (Ophthalmology)  Extended Emergency Contact Information Primary Emergency Contact: David [name] Address: [address]          [place], [place] [address] [place] Phone: [phone] Relation: Spouse  Code Status: Full Code  Goals of care: Advanced Directive information    11/24/2021    1:21 PM  Advanced Directives  Does Patient Have a Medical Advance Directive? No  Would patient like information on creating a medical advance directive? No - Patient declined     Chief Complaint  Patient presents with   Acute Visit    Patient complains of urinary frequency, dysuria, and back pain for about 2 weeks.     HPI:  Pt is a 76 y.o. male seen today for an acute visit for evaluation of urine frequency,dysuria and lower back pain x 2 weeks.Has been voiding 4-5 times at night.Also has urine frequency throughout the day. 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    Flatulence, eructation, and gas pain    Headache(784.0)    Hemorrhoids, external    Herpes zoster with unspecified complication    Hyperpotassemia    Insomnia, unspecified    Lumbago    Lumbago    Myalgia and myositis, unspecified    Nocturia    Obesity, unspecified    Osteoarthrosis, unspecified whether generalized or localized, unspecified site    Other abnormal blood chemistry    Other  headache syndromes(339.89)    Pure hyperglyceridemia    Rash and other nonspecific skin eruption    Rotator cuff (capsule) sprain    Routine general medical examination at a health care facility    Tobacco use disorder    Unspecified essential hypertension    Unspecified hypertrophic and atrophic condition of skin    Unspecified sleep apnea    Past Surgical History:  Procedure Laterality Date   DENTAL SURGERY  05/10/2021   Per patient   HEMORRHOID SURGERY     VARICOSE VEIN SURGERY      Allergies  Allergen Reactions   Celebrex [Celecoxib]    Erythromycin    Oxycodone Hcl Other (See Comments)   Penicillins    Tetracyclines & Related     Outpatient Encounter Medications as of 11/24/2021  Medication Sig   loratadine (CLARITIN) 10 MG tablet TAKE 1 TABLET BY MOUTH DAILY AS NEEDED FOR ALLERGIES   losartan (COZAAR) 50 MG tablet TAKE 1 TABLET(50 MG) BY MOUTH DAILY FOR BLOOD PRESSURE   simvastatin (ZOCOR) 20 MG tablet Take 1 tablet (20 mg total) by mouth daily at 6 PM.   traZODone (DESYREL) 100 MG tablet Take 1 tablet (100 mg total) by mouth at bedtime.   No facility-administered encounter medications on file as of 11/24/2021.    Review of Systems  Constitutional:  Negative for appetite change, chills, fatigue and fever.  Gastrointestinal:  Negative for abdominal distention, abdominal pain, nausea and vomiting.  Genitourinary:  Positive for dysuria, frequency and urgency. Negative for difficulty urinating, flank pain and hematuria.  Musculoskeletal:  Positive for back pain. Negative for gait problem.   Immunization History  Administered Date(s) Administered   Fluad Quad(high Dose 65+) 03/31/2019, 04/05/2020, 06/09/2021   Influenza, High Dose Seasonal PF 04/03/2017, 05/02/2017, 03/28/2018, 05/07/2018, 05/08/2019, 05/10/2020   Influenza,inj,Quad PF,6+ Mos 06/13/2013, 04/10/2014, 05/06/2015, 05/12/2016   Influenza-Unspecified 04/09/2010, 05/09/2012   Moderna Covid-19 Vaccine  Bivalent Booster 29yr & up 05/06/2021   PFIZER(Purple Top)SARS-COV-2 Vaccination 09/07/2019, 10/07/2019, 05/06/2020   Pneumococcal Conjugate-13 04/16/2014   Pneumococcal Polysaccharide-23 01/26/2011, 11/25/2015, 05/02/2017, 05/07/2018, 05/08/2019, 05/10/2020, 05/10/2021   Td 07/10/2000, 06/13/2013   Zoster Recombinat (Shingrix) 11/20/2016, 03/28/2017   Zoster, Live 08/05/2012   Pertinent  Health Maintenance Due  Topic Date Due   INFLUENZA VACCINE  02/07/2022   COLONOSCOPY (Pts 45-424yrInsurance coverage will need to be confirmed)  Discontinued      12/31/2020    9:32 AM 02/24/2021    8:18 AM 06/09/2021    8:31 AM 10/13/2021   10:03 AM 11/24/2021    1:21 PM  Fall Risk  Falls in the past year? 0 0 0 0 0  Was there an injury with Fall? 0 0 0 0 0  Fall Risk Category Calculator 0 0 0 0 0  Fall Risk Category Low Low Low Low Low  Patient Fall Risk Level Low fall risk Low fall risk Low fall risk Low fall risk Low fall risk  Patient at Risk for Falls Due to No Fall Risks No Fall Risks No Fall Risks No Fall Risks No Fall Risks  Fall risk Follow up  Falls evaluation completed;Education provided;Falls prevention discussed Falls evaluation completed Falls evaluation completed Falls evaluation completed   Functional Status Survey:    Vitals:   11/24/21 1318  BP: 110/86  Pulse: 81  Resp: 16  Temp: 97.6 F (36.4 C)  SpO2: 96%  Weight: 219 lb 11.2 oz (99.7 kg)  Height: '5\' 10"'$  (1.778 m)   Body mass index is 31.52 kg/m. Physical Exam Constitutional:      General: He is not in acute distress.    Appearance: He is not ill-appearing.  Cardiovascular:     Rate and Rhythm: Normal rate and regular rhythm.     Pulses: Normal pulses.     Heart sounds: Normal heart sounds. No murmur heard.   No friction rub. No gallop.  Pulmonary:     Effort: Pulmonary effort is normal. No respiratory distress.     Breath sounds: Normal breath sounds. No wheezing, rhonchi or rales.  Chest:     Chest wall:  No tenderness.  Abdominal:     General: Bowel sounds are normal. There is no distension.     Palpations: Abdomen is soft. There is no mass.     Tenderness: There is no abdominal tenderness. There is no right CVA tenderness, left CVA tenderness, guarding or rebound.  Skin:    General: Skin is warm and dry.     Coloration: Skin is not pale.     Findings: No erythema or rash.  Neurological:     Mental Status: He is alert and oriented to person, place, and time.     Motor: No weakness.     Gait: Gait normal.  Psychiatric:        Mood and Affect: Mood normal.        Behavior: Behavior normal.    Labs reviewed: Recent Labs    02/21/21 0842 06/06/21 0846 10/10/21  7353  NA 140 139 143  K 4.0 4.2 5.0  CL 107 106 107  CO2 '26 26 30  '$ GLUCOSE 118* 121* 131*  BUN '16 18 16  '$ CREATININE 0.69* 0.79 0.67*  CALCIUM 9.1 9.2 9.3   Recent Labs    02/21/21 0842 06/06/21 0846 10/10/21 0811  AST '15 16 14  '$ ALT '12 13 11  '$ BILITOT 0.4 0.4 0.4  PROT 6.7 7.2 7.2   Recent Labs    02/21/21 0842 06/06/21 0846 10/10/21 0811  WBC 5.2 6.1 6.5  NEUTROABS 3,151 3,916 4,102  HGB 14.0 13.9 14.6  HCT 42.7 41.4 44.1  MCV 93.0 93.0 92.8  PLT 221 218 233   No results found for: TSH Lab Results  Component Value Date   HGBA1C 6.0 (H) 10/10/2021   Lab Results  Component Value Date   CHOL 170 10/10/2021   HDL 37 (L) 10/10/2021   LDLCALC 97 10/10/2021   TRIG 253 (H) 10/10/2021   CHOLHDL 4.6 10/10/2021    Significant Diagnostic Results in last 30 days:  No results found.  Assessment/Plan  1. Dysuria Afebrile  - POC Urinalysis Dipstick indicates dark yellow urine  positive large blood but negative for nitrites and leukocytes.Results discussed with patient recommended sending urine for culture and sensitivity.Made aware culture will return in 2 to 3 days.  Option given to start on antibiotic or wait for final urine culture and sensitivity.  Patient aware that starting on antibiotics might  require it to be switch if final culture not sensitive to started antibiotics.  Patient would like to wait for the final urine culture " states has waited this long might as well wait for another 3 days".  -Advised to increase fluid intake -Recommended to start on cranberry tablet 475 mg tablet 1 by mouth twice daily We will call with final urine culture results and treat with antibiotics if indicated. - Urine Culture  2. Low back pain, unspecified back pain laterality, unspecified chronicity, unspecified whether sciatica present No flank tenderness on exam.  Suspect possible urinary tract infection as above - POC Urinalysis Dipstick - Urine Culture  3. Urinary frequency Voids 4-5 times at night and has frequency throughout the day too.  We will also recommend checking for blood prostate on next visit if urine culture is negative for infection - POC Urinalysis Dipstick - Urine Culture  Family/ staff Communication: Reviewed plan of care with patient verbalized understanding  Labs/tests ordered:  - POC Urinalysis Dipstick - Urine Culture  Next Appointment: As needed if symptoms worsen or fail to improve  Sandrea Hughs, NP

## 2021-11-25 LAB — URINE CULTURE
MICRO NUMBER:: 13415369
SPECIMEN QUALITY:: ADEQUATE

## 2021-11-28 ENCOUNTER — Other Ambulatory Visit: Payer: Self-pay | Admitting: Family

## 2021-11-28 ENCOUNTER — Other Ambulatory Visit: Payer: Medicare PPO

## 2021-11-28 DIAGNOSIS — R300 Dysuria: Secondary | ICD-10-CM

## 2021-11-28 MED ORDER — CIPROFLOXACIN HCL 500 MG PO TABS: 500.0000 mg | ORAL_TABLET | Freq: Two times a day (BID) | ORAL | 0 refills | Status: AC

## 2021-11-28 NOTE — Progress Notes (Signed)
This encounter was created in error - please disregard.

## 2021-11-29 LAB — URINE CULTURE
MICRO NUMBER:: 13428183
Result:: NO GROWTH
SPECIMEN QUALITY:: ADEQUATE

## 2021-12-01 ENCOUNTER — Other Ambulatory Visit: Payer: Self-pay | Admitting: Orthopedic Surgery

## 2021-12-01 DIAGNOSIS — R300 Dysuria: Secondary | ICD-10-CM

## 2021-12-01 NOTE — Progress Notes (Signed)
Urine culture negative for growth. He is requesting to see urologist. Referral to [facility] ordered.

## 2021-12-06 ENCOUNTER — Other Ambulatory Visit: Payer: Self-pay | Admitting: Family

## 2021-12-06 DIAGNOSIS — I10 Essential (primary) hypertension: Secondary | ICD-10-CM

## 2021-12-15 ENCOUNTER — Ambulatory Visit (INDEPENDENT_AMBULATORY_CARE_PROVIDER_SITE_OTHER): Payer: Medicare PPO | Admitting: Orthopedic Surgery

## 2021-12-15 ENCOUNTER — Encounter: Payer: Self-pay | Admitting: Orthopedic Surgery

## 2021-12-15 VITALS — BP 110/70 | HR 99 | Temp 97.3°F | Resp 16 | Ht 70.0 in | Wt 216.4 lb

## 2021-12-15 DIAGNOSIS — R319 Hematuria, unspecified: Secondary | ICD-10-CM | POA: Diagnosis not present

## 2021-12-15 DIAGNOSIS — M545 Low back pain, unspecified: Secondary | ICD-10-CM | POA: Diagnosis not present

## 2021-12-15 DIAGNOSIS — R350 Frequency of micturition: Secondary | ICD-10-CM

## 2021-12-15 DIAGNOSIS — R109 Unspecified abdominal pain: Secondary | ICD-10-CM | POA: Diagnosis not present

## 2021-12-15 DIAGNOSIS — R300 Dysuria: Secondary | ICD-10-CM

## 2021-12-15 LAB — POCT URINALYSIS DIPSTICK
Bilirubin, UA: NEGATIVE
Glucose, UA: NEGATIVE
Ketones, UA: NEGATIVE
Leukocytes, UA: NEGATIVE
Nitrite, UA: NEGATIVE
Protein, UA: NEGATIVE
Spec Grav, UA: 1.025 (ref 1.010–1.025)
Urobilinogen, UA: 0.2 E.U./dL
pH, UA: 6 (ref 5.0–8.0)

## 2021-12-15 MED ORDER — FINASTERIDE 5 MG PO TABS: 5.0000 mg | ORAL_TABLET | Freq: Every day | ORAL | 1 refills | Status: DC

## 2021-12-15 NOTE — Patient Instructions (Signed)
Please start Proscar for urinary frequency  Please report to ED if symptoms of flank pain, fever, malaise worsen   Urology consult made  Hydrate well with water 40-60 ounces daily  Avoid bladder irritants like caffeine

## 2021-12-15 NOTE — Progress Notes (Signed)
Careteam: Patient Care Team: Yvonna Alanis, NP as PCP - General (Adult Health Nurse Practitioner) Katy Apo, MD as Consulting Physician (Ophthalmology) Yvonna Alanis, NP as Nurse Practitioner (Adult Health Nurse Practitioner)  Seen by: Windell Moulding, AGNP-C  PLACE OF SERVICE:  [place] Directive information Does Patient Have a Medical Advance Directive?: No, Would patient like information on creating a medical advance directive?: No - Patient declined  Allergies  Allergen Reactions   Celebrex [Celecoxib]    Erythromycin    Oxycodone Hcl Other (See Comments)   Penicillins    Tetracyclines & Related     Chief Complaint  Patient presents with   Acute Visit    Walk in, overall not feeling well and burning when urinating and frequency      HPI: Patient is a 76 y.o. male seen today for acute visit due to dysuria and urinary frequency.   05/18 was seen for dysuria and back pain x 2 weeks. 05/18 UA positive for ketones, spec gravity > 1.030, blood, protein, negative nitrates and leukocytes. Urine culture no growth after second recollection. He was started on Cipro due to symptoms.   Today, he presents with unresolved symptoms of dysuria, low middle back pain and nocturia. Reports going about 5-6x/night. He feels constant urge to go. He also reports feeling feverish at times. Back pain intermittent, and increased with movement.   Review of Systems:  Review of Systems  Constitutional:  Positive for fever. Negative for chills, malaise/fatigue and weight loss.  Respiratory:  Negative for cough, shortness of breath and wheezing.   Cardiovascular:  Negative for chest pain and leg swelling.  Genitourinary:  Positive for dysuria and frequency. Negative for flank pain and hematuria.  Musculoskeletal:  Positive for back pain.  Psychiatric/Behavioral:  Negative for depression. The patient has insomnia. The patient is not nervous/anxious.     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    Flatulence, eructation, and gas pain    Headache(784.0)    Hemorrhoids, external    Herpes zoster with unspecified complication    Hyperpotassemia    Insomnia, unspecified    Lumbago    Lumbago    Myalgia and myositis, unspecified    Nocturia    Obesity, unspecified    Osteoarthrosis, unspecified whether generalized or localized, unspecified site    Other abnormal blood chemistry    Other headache syndromes(339.89)    Pure hyperglyceridemia    Rash and other nonspecific skin eruption    Rotator cuff (capsule) sprain    Routine general medical examination at a health care facility    Tobacco use disorder    Unspecified essential hypertension    Unspecified hypertrophic and atrophic condition of skin    Unspecified sleep apnea    Past Surgical History:  Procedure Laterality Date   DENTAL SURGERY  05/10/2021   Per patient   HEMORRHOID SURGERY     VARICOSE VEIN SURGERY     Social History:   reports that he quit smoking about 6 years ago. His smoking use included cigarettes. He has a 82.50 pack-year smoking history. He has never used  smokeless tobacco. He reports that he does not drink alcohol and does not use drugs.  Family History  Problem Relation Age of Onset   Diabetes Mother    Heart disease Mother    Stroke Mother    Stroke Father     Medications: Patient's Medications  New Prescriptions   No medications on file  Previous Medications   CRANBERRY 475 MG CAPS    Take 1 capsule (475 mg total) by mouth 2 (two) times daily.   LORATADINE (CLARITIN) 10 MG TABLET    TAKE 1 TABLET BY MOUTH DAILY AS NEEDED FOR ALLERGIES   LOSARTAN  (COZAAR) 50 MG TABLET    TAKE 1 TABLET(50 MG) BY MOUTH DAILY FOR BLOOD PRESSURE   SIMVASTATIN (ZOCOR) 20 MG TABLET    Take 1 tablet (20 mg total) by mouth daily at 6 PM.   TRAZODONE (DESYREL) 100 MG TABLET    Take 1 tablet (100 mg total) by mouth at bedtime.  Modified Medications   No medications on file  Discontinued Medications   No medications on file    Physical Exam:  Vitals:   12/15/21 1154  BP: 110/70  Pulse: 99  Resp: 16  Temp: (!) 97.3 F (36.3 C)  SpO2: 94%  Weight: 216 lb 6.4 oz (98.2 kg)  Height: '5\' 10"'$  (1.778 m)   Body mass index is 31.05 kg/m. Wt Readings from Last 3 Encounters:  12/15/21 216 lb 6.4 oz (98.2 kg)  11/24/21 219 lb 11.2 oz (99.7 kg)  10/13/21 216 lb 6.4 oz (98.2 kg)    Physical Exam Vitals reviewed.  Constitutional:      General: He is not in acute distress.    Comments: Appears tired  HENT:     Head: Normocephalic.  Eyes:     General:        Right eye: No discharge.        Left eye: No discharge.  Cardiovascular:     Rate and Rhythm: Normal rate and regular rhythm.     Pulses: Normal pulses.     Heart sounds: Normal heart sounds.  Pulmonary:     Effort: Pulmonary effort is normal. No respiratory distress.     Breath sounds: Normal breath sounds. No wheezing.  Abdominal:     General: Bowel sounds are normal. There is no distension.     Palpations: Abdomen is soft.     Tenderness: There is no abdominal tenderness. There is no right CVA tenderness or left CVA tenderness.  Musculoskeletal:     Cervical back: Neck supple.     Lumbar back: No swelling, deformity or tenderness. Normal range of motion.     Right lower leg: Edema present.     Left lower leg: No edema.  Skin:    General: Skin is warm and dry.     Capillary Refill: Capillary refill takes less than 2 seconds.  Neurological:     General: No focal deficit present.     Mental Status: He is alert and oriented to person, place, and time.  Psychiatric:        Mood and Affect:  Mood normal.        Behavior: Behavior normal.     Labs reviewed: Basic Metabolic Panel: Recent Labs    02/21/21 0842 06/06/21 0846 10/10/21 0811  NA 140 139 143  K 4.0 4.2 5.0  CL 107 106 107  CO2 '26 26 30  '$ GLUCOSE 118* 121* 131*  BUN '16 18 16  '$ CREATININE  0.69* 0.79 0.67*  CALCIUM 9.1 9.2 9.3   Liver Function Tests: Recent Labs    02/21/21 0842 06/06/21 0846 10/10/21 0811  AST '15 16 14  '$ ALT '12 13 11  '$ BILITOT 0.4 0.4 0.4  PROT 6.7 7.2 7.2   No results for input(s): "LIPASE", "AMYLASE" in the last 8760 hours. No results for input(s): "AMMONIA" in the last 8760 hours. CBC: Recent Labs    02/21/21 0842 06/06/21 0846 10/10/21 0811  WBC 5.2 6.1 6.5  NEUTROABS 3,151 3,916 4,102  HGB 14.0 13.9 14.6  HCT 42.7 41.4 44.1  MCV 93.0 93.0 92.8  PLT 221 218 233   Lipid Panel: Recent Labs    02/21/21 0842 06/06/21 0846 10/10/21 0811  CHOL 116 119 170  HDL 37* 36* 37*  LDLCALC 51 61 97  TRIG 226* 141 253*  CHOLHDL 3.1 3.3 4.6   TSH: No results for input(s): "TSH" in the last 8760 hours. A1C: Lab Results  Component Value Date   HGBA1C 6.0 (H) 10/10/2021     Assessment/Plan 1. Dysuria - ongoing, initial symptoms began 05/18 - afebrile today, no CVA tenderness - 06/08 UA- blood trace, negative for nitrates and leukocytes - 05/18 UA positive for ketones, spec gravity > 1.030, blood (large), protein, negative nitrates and leukocytes  - 05/22 urine culture no growth after second recollection - given cipro due to symptoms - encourage hydration with water  - cont cranberry supplement - POC Urinalysis Dipstick - Culture, Urine - CBC with Differential/Platelet  2. Urinary frequency - noted to have increased frequency in past - now having nocturia 5-6x/night - suspect underlying BPH - cannot trial Flomax due to Celebrex allergy - will try finasteride to help with symptoms - POC Urinalysis Dipstick- see above - Culture, Urine - finasteride (PROSCAR) 5 MG  tablet; Take 1 tablet (5 mg total) by mouth daily.  Dispense: 30 tablet; Refill: 1 - Basic Metabolic Panel - Ambulatory referral to Urology  3. Hematuria, unspecified type - 05/18 UA revealed large amount of blood - 06/08 UA revealed trace amounts - discussed concerns with patient- malignancy/infection - Ambulatory referral to Urology  4. Low back pain, unspecified - intermittent back pain since urinary symptoms began - exam unremarkable - pain intermittent and stimulated with movement - advised to take tylenol prn for pain - see above  Total time: 32. Greater than 50% of total time spent doing patient education regarding dysuria, intermittent back pain, nocturia, hematuria, medication management.   Next appt: none Demontay Grantham Violet, [facility] [phone]

## 2021-12-16 LAB — CBC WITH DIFFERENTIAL/PLATELET
Absolute Monocytes: 418 cells/uL (ref 200–950)
Basophils Absolute: 61 cells/uL (ref 0–200)
Basophils Relative: 1.2 %
Eosinophils Absolute: 122 cells/uL (ref 15–500)
Eosinophils Relative: 2.4 %
HCT: 45.5 % (ref 38.5–50.0)
Hemoglobin: 15.4 g/dL (ref 13.2–17.1)
Lymphs Abs: 1132 cells/uL (ref 850–3900)
MCH: 31.8 pg (ref 27.0–33.0)
MCHC: 33.8 g/dL (ref 32.0–36.0)
MCV: 93.8 fL (ref 80.0–100.0)
MPV: 12.3 fL (ref 7.5–12.5)
Monocytes Relative: 8.2 %
Neutro Abs: 3366 cells/uL (ref 1500–7800)
Neutrophils Relative %: 66 %
Platelets: 194 10*3/uL (ref 140–400)
RBC: 4.85 10*6/uL (ref 4.20–5.80)
RDW: 13.5 % (ref 11.0–15.0)
Total Lymphocyte: 22.2 %
WBC: 5.1 10*3/uL (ref 3.8–10.8)

## 2021-12-16 LAB — BASIC METABOLIC PANEL
BUN: 16 mg/dL (ref 7–25)
CO2: 25 mmol/L (ref 20–32)
Calcium: 9.5 mg/dL (ref 8.6–10.3)
Chloride: 105 mmol/L (ref 98–110)
Creat: 0.76 mg/dL (ref 0.70–1.28)
Glucose, Bld: 129 mg/dL (ref 65–139)
Potassium: 5.2 mmol/L (ref 3.5–5.3)
Sodium: 143 mmol/L (ref 135–146)

## 2021-12-16 LAB — URINE CULTURE
MICRO NUMBER:: 13501512
Result:: NO GROWTH
SPECIMEN QUALITY:: ADEQUATE

## 2021-12-23 DIAGNOSIS — N401 Enlarged prostate with lower urinary tract symptoms: Secondary | ICD-10-CM | POA: Diagnosis not present

## 2021-12-23 DIAGNOSIS — R300 Dysuria: Secondary | ICD-10-CM | POA: Diagnosis not present

## 2021-12-23 DIAGNOSIS — R3914 Feeling of incomplete bladder emptying: Secondary | ICD-10-CM | POA: Diagnosis not present

## 2022-01-04 ENCOUNTER — Encounter: Payer: Self-pay | Admitting: Orthopedic Surgery

## 2022-01-05 ENCOUNTER — Ambulatory Visit (INDEPENDENT_AMBULATORY_CARE_PROVIDER_SITE_OTHER): Payer: Medicare PPO | Admitting: Orthopedic Surgery

## 2022-01-05 ENCOUNTER — Encounter: Payer: Self-pay | Admitting: Orthopedic Surgery

## 2022-01-05 VITALS — BP 118/80 | HR 83 | Temp 96.9°F | Resp 18 | Ht 70.0 in | Wt 217.0 lb

## 2022-01-05 DIAGNOSIS — Z122 Encounter for screening for malignant neoplasm of respiratory organs: Secondary | ICD-10-CM

## 2022-01-05 DIAGNOSIS — Z0000 Encounter for general adult medical examination without abnormal findings: Secondary | ICD-10-CM

## 2022-01-05 NOTE — Patient Instructions (Signed)
  David [name] , Thank you for taking time to come for your Medicare Wellness Visit. I appreciate your ongoing commitment to your health goals. Please review the following plan we discussed and let me know if I can assist you in the future.   These are the goals we discussed:  Goals      lose weight     Starting 11/16/16 I will start trying to walk to lose weight.      Quit smoking / using tobacco     Has quit smoking since 2017         This is a list of the screening recommended for you and due dates:  Health Maintenance  Topic Date Due   Flu Shot  02/07/2022   Tetanus Vaccine  06/14/2023   Pneumonia Vaccine  Completed   Hepatitis C Screening: USPSTF Recommendation to screen - Ages 71-79 yo.  Completed   Zoster (Shingles) Vaccine  Completed   HPV Vaccine  Aged Out   Colon Cancer Screening  Discontinued   COVID-19 Vaccine  Discontinued    [facility] should call to schedule CT lung- to r/o lung cancer - ask if there is copay- [phone]  Ask pharmacist about Shingrix vaccine- new shingles vaccine

## 2022-01-05 NOTE — Progress Notes (Signed)
Subjective:   David [name]. is a 76 y.o. male who presents for Medicare Annual/Subsequent preventive examination.  Place of Service: [place] Provider: Windell Moulding, AGNP-C   Review of Systems     Cardiac Risk Factors include: sedentary lifestyle;advanced age (>4mn, >>67women);hypertension     Objective:    Today's Vitals   01/05/22 1001  BP: 118/80  Pulse: 83  Resp: 18  Temp: (!) 96.9 F (36.1 C)  TempSrc: Temporal  SpO2: 94%  Weight: 217 lb (98.4 kg)  Height: '5\' 10"'$  (1.778 m)   Body mass index is 31.14 kg/m.     01/04/2022    3:56 PM 12/15/2021   11:58 AM 11/24/2021    1:21 PM 10/12/2021    2:31 PM 06/09/2021    8:31 AM 12/31/2020    9:32 AM 11/26/2020    9:48 AM  Advanced Directives  Does Patient Have a Medical Advance Directive? No No No No No No No  Would patient like information on creating a medical advance directive? No - Patient declined No - Patient declined No - Patient declined No - Patient declined No - Patient declined No - Patient declined No - Patient declined    Current Medications (verified) Outpatient Encounter Medications as of 01/05/2022  Medication Sig   finasteride (PROSCAR) 5 MG tablet Take 1 tablet (5 mg total) by mouth daily.   loratadine (CLARITIN) 10 MG tablet TAKE 1 TABLET BY MOUTH DAILY AS NEEDED FOR ALLERGIES   losartan (COZAAR) 50 MG tablet TAKE 1 TABLET(50 MG) BY MOUTH DAILY FOR BLOOD PRESSURE   simvastatin (ZOCOR) 20 MG tablet Take 1 tablet (20 mg total) by mouth daily at 6 PM.   traZODone (DESYREL) 100 MG tablet Take 1 tablet (100 mg total) by mouth at bedtime.   No facility-administered encounter medications on file as of 01/05/2022.    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    Flatulence, eructation, and gas pain    Headache(784.0)    Hemorrhoids, external    Herpes zoster with unspecified complication    Hyperpotassemia    Insomnia, unspecified    Lumbago    Lumbago    Myalgia and myositis, unspecified    Nocturia    Obesity, unspecified    Osteoarthrosis, unspecified whether generalized or localized, unspecified site    Other abnormal blood chemistry    Other headache syndromes(339.89)    Pure hyperglyceridemia    Rash and other nonspecific skin eruption    Rotator cuff (capsule) sprain    Routine general medical examination at a health care facility    Tobacco use disorder    Unspecified essential hypertension    Unspecified hypertrophic and atrophic condition of skin    Unspecified sleep apnea    Past Surgical History:  Procedure Laterality Date   DENTAL SURGERY  05/10/2021   Per patient   HEMORRHOID SURGERY     VARICOSE VEIN SURGERY     Family History  Problem Relation Age of Onset   Diabetes Mother    Heart disease  Mother    Stroke Mother    Stroke Father    Social History   Socioeconomic History   Marital status: Married    Spouse name: Not on file   Number of children: Not on file   Years of education: Not on file   Highest education level: Not on file  Occupational History   Not on file  Tobacco Use   Smoking status: Former    Packs/day: 1.50    Years: 55.00    Total pack years: 82.50    Types: Cigarettes    Quit date: 07/30/2015    Years since quitting: 6.4   Smokeless tobacco: Never   Tobacco comments:    pt thinks increased effexor dose helped him to quit  Vaping Use   Vaping Use: Never used  Substance and Sexual Activity   Alcohol use: No    Alcohol/week: 0.0 standard drinks of alcohol   Drug use: No    Sexual activity: Not Currently  Other Topics Concern   Not on file  Social History Narrative   Married   Stopped smoking 07/30/15   Alcohol none   Exercise none   [employer]    Social Determinants of Health   Financial Resource Strain: Medium Risk (01/05/2022)   Overall Financial Resource Strain (CARDIA)    Difficulty of Paying Living Expenses: Somewhat hard  Food Insecurity: Food Insecurity Present (01/05/2022)   Hunger Vital Sign    Worried About [place] in the Last Year: Sometimes true    Ran Out of Food in the Last Year: Sometimes true  Transportation Needs: No Transportation Needs (01/05/2022)   PRAPARE - Hydrologist (Medical): No    Lack of Transportation (Non-Medical): No  Physical Activity: Inactive (01/05/2022)   Exercise Vital Sign    Days of Exercise per Week: 0 days    Minutes of Exercise per Session: 0 min  Stress: No Stress Concern Present (01/05/2022)   [place]    Feeling of Stress : Only a little  Social Connections: Moderately Isolated (01/05/2022)   Social Connection and Isolation Panel [NHANES]    Frequency of Communication with Friends and Family: Once a week    Frequency of Social Gatherings with Friends and Family: Once a week    Attends Religious Services: More than 4 times per year    Active Member of [employer] or Organizations: No    Attends Music therapist: Never    Marital Status: Married    Tobacco Counseling Counseling given: Not Answered Tobacco comments: pt thinks increased effexor dose helped him to quit   Clinical Intake:  Pre-visit preparation completed: No  Pain : No/denies pain     BMI - recorded: 31.14 Nutritional Status: BMI > 30  Obese Nutritional Risks: None Diabetes: No  How often do you need to have someone help you when you read instructions, pamphlets, or other written materials from your doctor  or pharmacy?: 1 - Never What is the last grade level you completed in school?: 12th grade  Diabetic?No  Interpreter Needed?: No      Activities of Daily Living    01/05/2022   10:30 AM  In your present state of health, do you have any difficulty performing the following activities:  Hearing? 0  Vision? 0  Difficulty concentrating or making decisions? 0  Walking or climbing stairs? 0  Dressing or bathing? 0  Doing  errands, shopping? 0  Preparing Food and eating ? N  Using the Toilet? N  In the past six months, have you accidently leaked urine? Y  Do you have problems with loss of bowel control? Y  Managing your Medications? N  Managing your Finances? N  Housekeeping or managing your Housekeeping? N    Patient Care Team: Yvonna Alanis, NP as PCP - General (Adult Health Nurse Practitioner) Katy Apo, MD as Consulting Physician (Ophthalmology) Yvonna Alanis, NP as Nurse Practitioner (Adult Health Nurse Practitioner)  Indicate any recent Medical Services you may have received from other than [facility] providers in the past year (date may be approximate).     Assessment:   This is a routine wellness examination for Naomi.  Hearing/Vision screen Vision Screening   Right eye Left eye Both eyes  Without correction 20/20 20/20   With correction       Dietary issues and exercise activities discussed: Current Exercise Habits: The patient does not participate in regular exercise at present, Exercise limited by: cardiac condition(s);orthopedic condition(s)   Goals Addressed             This Visit's Progress    lose weight   On track    Starting 11/16/16 I will start trying to walk to lose weight.      Quit smoking / using tobacco   On track    Has quit smoking since 2017        Depression Screen    01/05/2022    9:57 AM 10/13/2021   10:04 AM 04/05/2020   10:52 AM 12/30/2019    9:14 AM 12/01/2019    8:33 AM 08/04/2019    1:19 PM 07/31/2019    8:09 AM  PHQ 2/9  Scores  PHQ - 2 Score 1 0 0 0 0 0 0  PHQ- 9 Score 2          Fall Risk    01/05/2022   10:30 AM 01/05/2022    9:50 AM 12/15/2021   11:57 AM 11/24/2021    1:21 PM 10/13/2021   10:03 AM  Fall Risk   Falls in the past year? 1 1 0 0 0  Number falls in past yr:  1 0 0 0  Injury with Fall? 0 0 0 0 0  Risk for fall due to : History of fall(s) History of fall(s) No Fall Risks No Fall Risks No Fall Risks  Follow up Falls evaluation completed;Education provided;Falls prevention discussed Falls evaluation completed Falls evaluation completed Falls evaluation completed Falls evaluation completed    FALL RISK PREVENTION PERTAINING TO THE HOME:  Any stairs in or around the home? Yes  If so, are there any without handrails? No  Home free of loose throw rugs in walkways, pet beds, electrical cords, etc? Yes  Adequate lighting in your home to reduce risk of falls? Yes   ASSISTIVE DEVICES UTILIZED TO PREVENT FALLS:  Life alert? No  Use of a cane, walker or w/c? No  Grab bars in the bathroom? No  Shower chair or bench in shower? No  Elevated toilet seat or a handicapped toilet? No   TIMED UP AND GO:  Was the test performed? No .  Length of time to ambulate 10 feet: N/A sec.   Gait steady and fast without use of assistive device  Cognitive Function:    01/05/2022    9:59 AM 12/31/2020    9:51 AM 12/24/2018    9:36 AM 12/20/2017  8:37 AM 11/16/2016    8:46 AM  MMSE - Mini Mental State Exam  Orientation to time '5 5 5 5 5  '$ Orientation to Place '5 5 5 5 5  '$ Registration '3 3 3 3 3  '$ Attention/ Calculation '5 5 5 5 5  '$ Recall '3 2 3 3 '$ 0  Language- name 2 objects '2 2 2 2 2  '$ Language- repeat '1 1 1 1 1  '$ Language- follow 3 step command '3 3 3 3 3  '$ Language- read & follow direction '1 1 1 1 1  '$ Write a sentence '1 1 1 1 1  '$ Copy design '1 1 1 1 '$ 0  Total score '30 29 30 30 26        '$ 12/30/2019    9:15 AM  6CIT Screen  What Year? 0 points  What month? 0 points  What time? 0 points  Count back from 20  0 points  Months in reverse 2 points  Repeat phrase 0 points  Total Score 2 points    Immunizations Immunization History  Administered Date(s) Administered   Fluad Quad(high Dose 65+) 03/31/2019, 04/05/2020, 06/09/2021   Influenza, High Dose Seasonal PF 04/03/2017, 05/02/2017, 03/28/2018, 05/07/2018, 05/08/2019, 05/10/2020   Influenza,inj,Quad PF,6+ Mos 06/13/2013, 04/10/2014, 05/06/2015, 05/12/2016   Influenza-Unspecified 04/09/2010, 05/09/2012   Moderna Covid-19 Vaccine Bivalent Booster 9yr & up 05/06/2021   PFIZER(Purple Top)SARS-COV-2 Vaccination 09/07/2019, 10/07/2019, 05/06/2020   Pneumococcal Conjugate-13 04/16/2014   Pneumococcal Polysaccharide-23 01/26/2011, 11/25/2015, 05/02/2017, 05/07/2018, 05/08/2019, 05/10/2020, 05/10/2021   Td 07/10/2000, 06/13/2013   Zoster Recombinat (Shingrix) 11/20/2016, 03/28/2017   Zoster, Live 08/05/2012    TDAP status: Up to date  Flu Vaccine status: Up to date  Pneumococcal vaccine status: Up to date  Covid-19 vaccine status: Completed vaccines  Qualifies for Shingles Vaccine? Yes   Zostavax completed Yes   Shingrix Completed?: No.    Education has been provided regarding the importance of this vaccine. Patient has been advised to call insurance company to determine out of pocket expense if they have not yet received this vaccine. Advised may also receive vaccine at local pharmacy or Health Dept. Verbalized acceptance and understanding.  Screening Tests Health Maintenance  Topic Date Due   COVID-19 Vaccine (5 - Pfizer series) 09/06/2021   INFLUENZA VACCINE  02/07/2022   TETANUS/TDAP  06/14/2023   Pneumonia Vaccine 76 Years old  Completed   Hepatitis C Screening  Completed   Zoster Vaccines- Shingrix  Completed   HPV VACCINES  Aged Out   COLONOSCOPY (Pts 45-470yrInsurance coverage will need to be confirmed)  DiBelfonteaintenance Due  Topic Date Due   COVID-19 Vaccine (5 - Pfizer series)  09/06/2021    Colorectal cancer screening: No longer required.   Lung Cancer Screening: (Low Dose CT Chest recommended if Age 77-80ears, 30 pack-year currently smoking OR have quit w/in 15years.) does qualify.   Lung Cancer Screening Referral: Yes  Additional Screening:  Hepatitis C Screening: does qualify; completed 6 years ago  Vision Screening: Recommended annual ophthalmology exams for early detection of glaucoma and other disorders of the eye. Is the patient up to date with their annual eye exam?  Yes  Who is the provider or what is the name of the office in which the patient attends annual eye exams? Dr. LyPrudencio Burlyf pt is not established with a provider, would they like to be referred to a provider to establish care? No .   Dental Screening: Recommended annual dental  exams for proper oral hygiene  Community Resource Referral / Chronic Care Management: CRR required this visit?  No   CCM required this visit?  No      Plan:     I have personally reviewed and noted the following in the patient's chart:   Medical and social history Use of alcohol, tobacco or illicit drugs  Current medications and supplements including opioid prescriptions. Patient is not currently taking opioid prescriptions. Functional ability and status Nutritional status Physical activity Advanced directives List of other physicians Hospitalizations, surgeries, and ER visits in previous 12 months Vitals Screenings to include cognitive, depression, and falls Referrals and appointments  In addition, I have reviewed and discussed with patient certain preventive protocols, quality metrics, and best practice recommendations. A written personalized care plan for preventive services as well as general preventive health recommendations were provided to patient.     Yvonna Alanis, NP   01/05/2022   Nurse Notes: Discussed shingles vaccine, orders placed for CT chest r/o lung cancer

## 2022-01-11 ENCOUNTER — Other Ambulatory Visit: Payer: Self-pay | Admitting: Orthopedic Surgery

## 2022-01-11 DIAGNOSIS — I10 Essential (primary) hypertension: Secondary | ICD-10-CM

## 2022-01-12 ENCOUNTER — Other Ambulatory Visit: Payer: Self-pay | Admitting: Family

## 2022-01-12 DIAGNOSIS — I10 Essential (primary) hypertension: Secondary | ICD-10-CM

## 2022-02-06 DIAGNOSIS — R3914 Feeling of incomplete bladder emptying: Secondary | ICD-10-CM | POA: Diagnosis not present

## 2022-02-06 DIAGNOSIS — N401 Enlarged prostate with lower urinary tract symptoms: Secondary | ICD-10-CM | POA: Diagnosis not present

## 2022-02-23 ENCOUNTER — Telehealth: Payer: Self-pay | Admitting: *Deleted

## 2022-02-24 NOTE — Telephone Encounter (Signed)
Pt is in referral workque. Pt was called and would like a call back next week. Will refer to referral notes.

## 2022-03-02 DIAGNOSIS — H1033 Unspecified acute conjunctivitis, bilateral: Secondary | ICD-10-CM | POA: Diagnosis not present

## 2022-03-06 ENCOUNTER — Other Ambulatory Visit: Payer: Self-pay

## 2022-03-06 DIAGNOSIS — Z9109 Other allergy status, other than to drugs and biological substances: Secondary | ICD-10-CM

## 2022-03-06 MED ORDER — LORATADINE 10 MG PO TABS: ORAL_TABLET | ORAL | 3 refills | Status: DC

## 2022-03-06 NOTE — Telephone Encounter (Signed)
Patient walked in for a medication refill for lortadine

## 2022-03-21 DIAGNOSIS — D492 Neoplasm of unspecified behavior of bone, soft tissue, and skin: Secondary | ICD-10-CM | POA: Diagnosis not present

## 2022-03-21 DIAGNOSIS — L814 Other melanin hyperpigmentation: Secondary | ICD-10-CM | POA: Diagnosis not present

## 2022-03-21 DIAGNOSIS — Z85820 Personal history of malignant melanoma of skin: Secondary | ICD-10-CM | POA: Diagnosis not present

## 2022-03-21 DIAGNOSIS — Z09 Encounter for follow-up examination after completed treatment for conditions other than malignant neoplasm: Secondary | ICD-10-CM | POA: Diagnosis not present

## 2022-03-21 DIAGNOSIS — D225 Melanocytic nevi of trunk: Secondary | ICD-10-CM | POA: Diagnosis not present

## 2022-03-21 DIAGNOSIS — Z872 Personal history of diseases of the skin and subcutaneous tissue: Secondary | ICD-10-CM | POA: Diagnosis not present

## 2022-03-21 DIAGNOSIS — C44329 Squamous cell carcinoma of skin of other parts of face: Secondary | ICD-10-CM | POA: Diagnosis not present

## 2022-03-21 DIAGNOSIS — L821 Other seborrheic keratosis: Secondary | ICD-10-CM | POA: Diagnosis not present

## 2022-03-21 DIAGNOSIS — Z08 Encounter for follow-up examination after completed treatment for malignant neoplasm: Secondary | ICD-10-CM | POA: Diagnosis not present

## 2022-03-22 DIAGNOSIS — R8279 Other abnormal findings on microbiological examination of urine: Secondary | ICD-10-CM | POA: Diagnosis not present

## 2022-03-22 DIAGNOSIS — R338 Other retention of urine: Secondary | ICD-10-CM | POA: Diagnosis not present

## 2022-03-22 DIAGNOSIS — N401 Enlarged prostate with lower urinary tract symptoms: Secondary | ICD-10-CM | POA: Diagnosis not present

## 2022-03-27 DIAGNOSIS — Z961 Presence of intraocular lens: Secondary | ICD-10-CM | POA: Diagnosis not present

## 2022-03-27 DIAGNOSIS — H26493 Other secondary cataract, bilateral: Secondary | ICD-10-CM | POA: Diagnosis not present

## 2022-04-07 DIAGNOSIS — R338 Other retention of urine: Secondary | ICD-10-CM | POA: Diagnosis not present

## 2022-04-11 DIAGNOSIS — R338 Other retention of urine: Secondary | ICD-10-CM | POA: Diagnosis not present

## 2022-04-17 ENCOUNTER — Other Ambulatory Visit: Payer: Self-pay

## 2022-04-17 ENCOUNTER — Other Ambulatory Visit: Payer: Medicare PPO

## 2022-04-17 DIAGNOSIS — I10 Essential (primary) hypertension: Secondary | ICD-10-CM | POA: Diagnosis not present

## 2022-04-17 DIAGNOSIS — E781 Pure hyperglyceridemia: Secondary | ICD-10-CM

## 2022-04-17 DIAGNOSIS — R7303 Prediabetes: Secondary | ICD-10-CM | POA: Diagnosis not present

## 2022-04-18 ENCOUNTER — Other Ambulatory Visit: Payer: Self-pay | Admitting: Family

## 2022-04-18 DIAGNOSIS — I10 Essential (primary) hypertension: Secondary | ICD-10-CM

## 2022-04-18 LAB — COMPREHENSIVE METABOLIC PANEL
AG Ratio: 1.4 (calc) (ref 1.0–2.5)
ALT: 10 U/L (ref 9–46)
AST: 14 U/L (ref 10–35)
Albumin: 4.2 g/dL (ref 3.6–5.1)
Alkaline phosphatase (APISO): 55 U/L (ref 35–144)
BUN: 19 mg/dL (ref 7–25)
CO2: 28 mmol/L (ref 20–32)
Calcium: 9.5 mg/dL (ref 8.6–10.3)
Chloride: 104 mmol/L (ref 98–110)
Creat: 0.81 mg/dL (ref 0.70–1.28)
Globulin: 3 g/dL (calc) (ref 1.9–3.7)
Glucose, Bld: 116 mg/dL — ABNORMAL HIGH (ref 65–99)
Potassium: 4.9 mmol/L (ref 3.5–5.3)
Sodium: 138 mmol/L (ref 135–146)
Total Bilirubin: 0.5 mg/dL (ref 0.2–1.2)
Total Protein: 7.2 g/dL (ref 6.1–8.1)

## 2022-04-18 LAB — CBC WITH DIFFERENTIAL/PLATELET
Absolute Monocytes: 534 cells/uL (ref 200–950)
Basophils Absolute: 72 cells/uL (ref 0–200)
Basophils Relative: 1.3 %
Eosinophils Absolute: 220 cells/uL (ref 15–500)
Eosinophils Relative: 4 %
HCT: 42.7 % (ref 38.5–50.0)
Hemoglobin: 14.5 g/dL (ref 13.2–17.1)
Lymphs Abs: 1568 cells/uL (ref 850–3900)
MCH: 31.7 pg (ref 27.0–33.0)
MCHC: 34 g/dL (ref 32.0–36.0)
MCV: 93.2 fL (ref 80.0–100.0)
MPV: 12.1 fL (ref 7.5–12.5)
Monocytes Relative: 9.7 %
Neutro Abs: 3108 cells/uL (ref 1500–7800)
Neutrophils Relative %: 56.5 %
Platelets: 185 10*3/uL (ref 140–400)
RBC: 4.58 10*6/uL (ref 4.20–5.80)
RDW: 13.1 % (ref 11.0–15.0)
Total Lymphocyte: 28.5 %
WBC: 5.5 10*3/uL (ref 3.8–10.8)

## 2022-04-18 LAB — LIPID PANEL
Cholesterol: 151 mg/dL (ref <200)
HDL: 41 mg/dL (ref >=40)
LDL Cholesterol (Calc): 78 mg/dL (calc)
Non-HDL Cholesterol (Calc): 110 mg/dL (calc) (ref <130)
Total CHOL/HDL Ratio: 3.7 (calc) (ref <5.0)
Triglycerides: 234 mg/dL — ABNORMAL HIGH (ref <150)

## 2022-04-18 LAB — HEMOGLOBIN A1C
Hgb A1c MFr Bld: 5.9 % of total Hgb — ABNORMAL HIGH (ref <5.7)
Mean Plasma Glucose: 123 mg/dL
eAG (mmol/L): 6.8 mmol/L

## 2022-04-20 ENCOUNTER — Ambulatory Visit: Payer: Medicare PPO | Admitting: Orthopedic Surgery

## 2022-04-23 ENCOUNTER — Other Ambulatory Visit: Payer: Self-pay | Admitting: Family

## 2022-04-23 DIAGNOSIS — I10 Essential (primary) hypertension: Secondary | ICD-10-CM

## 2022-04-27 ENCOUNTER — Ambulatory Visit (INDEPENDENT_AMBULATORY_CARE_PROVIDER_SITE_OTHER): Payer: Medicare PPO | Admitting: Orthopedic Surgery

## 2022-04-27 ENCOUNTER — Encounter: Payer: Self-pay | Admitting: Orthopedic Surgery

## 2022-04-27 VITALS — BP 153/83 | HR 81 | Temp 98.4°F | Ht 70.0 in | Wt 214.0 lb

## 2022-04-27 DIAGNOSIS — C44300 Unspecified malignant neoplasm of skin of unspecified part of face: Secondary | ICD-10-CM

## 2022-04-27 DIAGNOSIS — I10 Essential (primary) hypertension: Secondary | ICD-10-CM | POA: Diagnosis not present

## 2022-04-27 DIAGNOSIS — E781 Pure hyperglyceridemia: Secondary | ICD-10-CM | POA: Diagnosis not present

## 2022-04-27 DIAGNOSIS — R7303 Prediabetes: Secondary | ICD-10-CM

## 2022-04-27 DIAGNOSIS — N401 Enlarged prostate with lower urinary tract symptoms: Secondary | ICD-10-CM

## 2022-04-27 DIAGNOSIS — G4709 Other insomnia: Secondary | ICD-10-CM

## 2022-04-27 DIAGNOSIS — Z23 Encounter for immunization: Secondary | ICD-10-CM

## 2022-04-27 DIAGNOSIS — R338 Other retention of urine: Secondary | ICD-10-CM | POA: Diagnosis not present

## 2022-04-27 MED ORDER — LOSARTAN POTASSIUM 50 MG PO TABS: ORAL_TABLET | ORAL | 3 refills | Status: DC

## 2022-04-27 MED ORDER — FINASTERIDE 5 MG PO TABS: 5.0000 mg | ORAL_TABLET | Freq: Every day | ORAL | 3 refills | Status: DC

## 2022-04-27 MED ORDER — SIMVASTATIN 20 MG PO TABS: 20.0000 mg | ORAL_TABLET | Freq: Every day | ORAL | 1 refills | Status: DC

## 2022-04-27 MED ORDER — TRAZODONE HCL 100 MG PO TABS: 100.0000 mg | ORAL_TABLET | Freq: Every day | ORAL | 2 refills | Status: DC

## 2022-04-27 NOTE — Patient Instructions (Signed)
Think about wearing sun screen (SPF 50) daily after  skin heals.

## 2022-04-27 NOTE — Progress Notes (Signed)
Careteam: Patient Care Team: Yvonna Alanis, NP as PCP - General (Adult Health Nurse Practitioner) Katy Apo, MD as Consulting Physician (Ophthalmology) Yvonna Alanis, NP as Nurse Practitioner (Adult Health Nurse Practitioner)  Seen by: Windell Moulding, AGNP-C  PLACE OF SERVICE:  [place] Directive information    Allergies  Allergen Reactions   Celebrex [Celecoxib]    Erythromycin    Oxycodone Hcl Other (See Comments)   Penicillins    Tetracyclines & Related     No chief complaint on file.    HPI: Patient is a 76 y.o. male seen today for medical management of chronic conditions.   Lab results discussed.   No health concerns.   Followed by [facility] for BPH/urinary retention. Foley placed 04/12/2022. Remains on silodosin and finasteride. 10/30 cystoscopy scheduled. Bladder outlet obstruction surgery recommended.   Seeing dermatology for skin cancer. He was given cream for face (tolac), and some areas on face were red and inflamed. Now using Cerave healing ointment. Reports improved symptoms. F/u 05/24/2022.   Would like flu vaccine.    Review of Systems:  Review of Systems  Constitutional:  Negative for chills, fever, malaise/fatigue and weight loss.  HENT:  Negative for congestion and sore throat.   Eyes:  Negative for blurred vision and double vision.  Respiratory:  Negative for cough, shortness of breath and wheezing.   Cardiovascular:  Negative for chest pain and leg swelling.  Gastrointestinal:  Negative for abdominal pain, blood in stool, constipation, diarrhea, heartburn, nausea and vomiting.  Genitourinary:  Negative for dysuria and hematuria.       Indwelling foley  Musculoskeletal:  Negative for falls and joint pain.  Skin:        Facial skin cancer  Neurological:  Negative for dizziness, weakness and headaches.  Psychiatric/Behavioral:  Negative for depression and memory loss. The patient is not nervous/anxious.     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    Flatulence, eructation, and gas pain    Headache(784.0)    Hemorrhoids, external    Herpes zoster with unspecified complication    Hyperpotassemia    Insomnia, unspecified    Lumbago    Lumbago    Myalgia and myositis, unspecified    Nocturia    Obesity, unspecified    Osteoarthrosis, unspecified whether generalized or localized, unspecified site    Other abnormal blood chemistry    Other headache syndromes(339.89)    Pure hyperglyceridemia    Rash and other nonspecific skin eruption    Rotator cuff (capsule) sprain    Routine general medical examination at a health care facility    Tobacco use disorder    Unspecified essential hypertension    Unspecified hypertrophic and atrophic condition of skin    Unspecified sleep apnea    Past Surgical History:  Procedure Laterality Date   DENTAL SURGERY  05/10/2021   Per patient   HEMORRHOID SURGERY     VARICOSE VEIN SURGERY     Social History:   reports that he quit smoking about 6 years ago. His smoking use included cigarettes. He has a 82.50 pack-year smoking  history. He has never used smokeless tobacco. He reports that he does not drink alcohol and does not use drugs.  Family History  Problem Relation Age of Onset   Diabetes Mother    Heart disease Mother    Stroke Mother    Stroke Father     Medications: Patient's Medications  New Prescriptions   No medications on file  Previous Medications   FINASTERIDE (PROSCAR) 5 MG TABLET    Take 1 tablet (5 mg total) by mouth daily.   LORATADINE (CLARITIN) 10 MG TABLET    TAKE 1 TABLET BY MOUTH DAILY AS NEEDED FOR ALLERGIES    LOSARTAN (COZAAR) 50 MG TABLET    TAKE 1 TABLET(50 MG) BY MOUTH DAILY FOR BLOOD PRESSURE   SILODOSIN (RAPAFLO) 8 MG CAPS CAPSULE    Take 8 mg by mouth daily with breakfast.   SIMVASTATIN (ZOCOR) 20 MG TABLET    Take 1 tablet (20 mg total) by mouth daily at 6 PM.   TRAZODONE (DESYREL) 100 MG TABLET    Take 1 tablet (100 mg total) by mouth at bedtime.  Modified Medications   No medications on file  Discontinued Medications   No medications on file    Physical Exam:  There were no vitals filed for this visit. There is no height or weight on file to calculate BMI. Wt Readings from Last 3 Encounters:  01/05/22 217 lb (98.4 kg)  12/15/21 216 lb 6.4 oz (98.2 kg)  11/24/21 219 lb 11.2 oz (99.7 kg)    Physical Exam Vitals reviewed.  Constitutional:      General: He is not in acute distress. HENT:     Head: Normocephalic.  Eyes:     General:        Right eye: No discharge.        Left eye: No discharge.  Cardiovascular:     Rate and Rhythm: Normal rate and regular rhythm.     Pulses: Normal pulses.     Heart sounds: Normal heart sounds.  Pulmonary:     Effort: Pulmonary effort is normal. No respiratory distress.     Breath sounds: Normal breath sounds. No wheezing.  Abdominal:     General: Bowel sounds are normal. There is no distension.     Palpations: Abdomen is soft.     Tenderness: There is no abdominal tenderness.  Genitourinary:    Comments: Urine yellow, clear in foley Musculoskeletal:     Cervical back: Neck supple.     Right lower leg: No edema.     Left lower leg: No edema.  Skin:    General: Skin is warm and dry.     Capillary Refill: Capillary refill takes less than 2 seconds.  Neurological:     General: No focal deficit present.     Mental Status: He is alert and oriented to person, place, and time.  Psychiatric:        Mood and Affect: Mood normal.        Behavior: Behavior normal.     Labs reviewed: Basic Metabolic Panel: Recent Labs     10/10/21 0811 12/15/21 1216 04/17/22 0816  NA 143 143 138  K 5.0 5.2 4.9  CL 107 105 104  CO2 '30 25 28  ' GLUCOSE 131* 129 116*  BUN '16 16 19  ' CREATININE 0.67* 0.76 0.81  CALCIUM 9.3 9.5 9.5   Liver Function Tests: Recent Labs    06/06/21 0846 10/10/21 0811 04/17/22 0816  AST 16 14 14  ALT '13 11 10  ' BILITOT 0.4 0.4 0.5  PROT 7.2 7.2 7.2   No results for input(s): "LIPASE", "AMYLASE" in the last 8760 hours. No results for input(s): "AMMONIA" in the last 8760 hours. CBC: Recent Labs    10/10/21 0811 12/15/21 1216 04/17/22 0816  WBC 6.5 5.1 5.5  NEUTROABS 4,102 3,366 3,108  HGB 14.6 15.4 14.5  HCT 44.1 45.5 42.7  MCV 92.8 93.8 93.2  PLT 233 194 185   Lipid Panel: Recent Labs    06/06/21 0846 10/10/21 0811 04/17/22 0816  CHOL 119 170 151  HDL 36* 37* 41  LDLCALC 61 97 78  TRIG 141 253* 234*  CHOLHDL 3.3 4.6 3.7   TSH: No results for input(s): "TSH" in the last 8760 hours. A1C: Lab Results  Component Value Date   HGBA1C 5.9 (H) 04/17/2022     Assessment/Plan 1. Essential hypertension - controlled - BUN/creat 19/0.81 04/17/2022 - losartan (COZAAR) 50 MG tablet; Take one tablet by mouth daily  Dispense: 90 tablet; Refill: 3 - CBC with Differential/Platelet; Future - CMP with eGFR([facility]); Future  2. Prediabetes - A1c 5.9, at goal  - cont diet low un sugars and carbs - Hemoglobin A1c; Future  3. Hypertriglyceridemia - stable - lipid panel- future - simvastatin (ZOCOR) 20 MG tablet; Take 1 tablet (20 mg total) by mouth daily at 6 PM.  Dispense: 90 tablet; Refill: 1  4. Need for influenza vaccination - Flu Vaccine QUAD High Dose(Fluad)  5. Urinary retention due to benign prostatic hyperplasia - followed by [facility] - cont finasteride and silodosin - foley in place, urine clear - 10/30 cysto scheduled - bladder outlet obstruction surgery recommended - finasteride (PROSCAR) 5 MG tablet; Take 1 tablet (5 mg total) by mouth daily.   Dispense: 30 tablet; Refill: 3  6. Other insomnia - sleeping 6-8 hrs night - traZODone (DESYREL) 100 MG tablet; Take 1 tablet (100 mg total) by mouth at bedtime.  Dispense: 90 tablet; Refill: 2  7. Skin cancer of face - followed by dermatology - note not available - cont Cerave ointment  Total time: 31 minutes. Greater than 50% of total time spent doing patient education regarding hypertension, prediabetes, hyperlipidemia, insomnia, urinary retention, and health maintenance.    Next appt: Visit date not found  [place], [facility] [phone]

## 2022-04-27 NOTE — Addendum Note (Signed)
Addended byWindell Moulding E on: 04/27/2022 11:52 AM   Modules accepted: Orders

## 2022-05-08 DIAGNOSIS — N401 Enlarged prostate with lower urinary tract symptoms: Secondary | ICD-10-CM | POA: Diagnosis not present

## 2022-05-08 DIAGNOSIS — R338 Other retention of urine: Secondary | ICD-10-CM | POA: Diagnosis not present

## 2022-05-10 DIAGNOSIS — H1045 Other chronic allergic conjunctivitis: Secondary | ICD-10-CM | POA: Diagnosis not present

## 2022-05-10 DIAGNOSIS — J309 Allergic rhinitis, unspecified: Secondary | ICD-10-CM | POA: Diagnosis not present

## 2022-05-10 DIAGNOSIS — R634 Abnormal weight loss: Secondary | ICD-10-CM | POA: Diagnosis not present

## 2022-05-10 DIAGNOSIS — R21 Rash and other nonspecific skin eruption: Secondary | ICD-10-CM | POA: Diagnosis not present

## 2022-05-17 DIAGNOSIS — R338 Other retention of urine: Secondary | ICD-10-CM | POA: Diagnosis not present

## 2022-05-24 DIAGNOSIS — L2389 Allergic contact dermatitis due to other agents: Secondary | ICD-10-CM | POA: Diagnosis not present

## 2022-05-24 DIAGNOSIS — Z08 Encounter for follow-up examination after completed treatment for malignant neoplasm: Secondary | ICD-10-CM | POA: Diagnosis not present

## 2022-05-24 DIAGNOSIS — Z86007 Personal history of in-situ neoplasm of skin: Secondary | ICD-10-CM | POA: Diagnosis not present

## 2022-05-24 DIAGNOSIS — L578 Other skin changes due to chronic exposure to nonionizing radiation: Secondary | ICD-10-CM | POA: Diagnosis not present

## 2022-06-16 DIAGNOSIS — R338 Other retention of urine: Secondary | ICD-10-CM | POA: Diagnosis not present

## 2022-06-16 DIAGNOSIS — N401 Enlarged prostate with lower urinary tract symptoms: Secondary | ICD-10-CM | POA: Diagnosis not present

## 2022-06-28 DIAGNOSIS — R233 Spontaneous ecchymoses: Secondary | ICD-10-CM | POA: Diagnosis not present

## 2022-06-28 DIAGNOSIS — D492 Neoplasm of unspecified behavior of bone, soft tissue, and skin: Secondary | ICD-10-CM | POA: Diagnosis not present

## 2022-06-28 DIAGNOSIS — L218 Other seborrheic dermatitis: Secondary | ICD-10-CM | POA: Diagnosis not present

## 2022-06-28 DIAGNOSIS — L570 Actinic keratosis: Secondary | ICD-10-CM | POA: Diagnosis not present

## 2022-06-28 DIAGNOSIS — L821 Other seborrheic keratosis: Secondary | ICD-10-CM | POA: Diagnosis not present

## 2022-07-17 DIAGNOSIS — R338 Other retention of urine: Secondary | ICD-10-CM | POA: Diagnosis not present

## 2022-07-17 DIAGNOSIS — N401 Enlarged prostate with lower urinary tract symptoms: Secondary | ICD-10-CM | POA: Diagnosis not present

## 2022-08-04 DIAGNOSIS — R338 Other retention of urine: Secondary | ICD-10-CM | POA: Diagnosis not present

## 2022-08-09 ENCOUNTER — Other Ambulatory Visit: Payer: Self-pay | Admitting: Urology

## 2022-08-14 ENCOUNTER — Encounter (HOSPITAL_BASED_OUTPATIENT_CLINIC_OR_DEPARTMENT_OTHER): Payer: Self-pay | Admitting: Urology

## 2022-08-14 NOTE — Progress Notes (Signed)
Spoke w/ via phone for pre-op interview--- David [name] needs dos----  EKG             Lab results------ COVID test -----patient states asymptomatic no test needed Arrive at -------0900 NPO after MN NO Solid Food.  Clear liquids from MN until---0800 Med rec completed Medications to take morning of surgery -----NONE Diabetic medication ----- Patient instructed no nail polish to be worn day of surgery Patient instructed to bring photo id and insurance card day of surgery Patient aware to have Driver (ride ) / caregiver Wife David [name]   for 24 hours after surgery  Patient Special Instructions ----- Pre-Op special Istructions ----- Patient verbalized understanding of instructions that were given at this phone interview. Patient denies shortness of breath, chest pain, fever, cough at this phone interview.  Patient is OWER [facility] guidelines discussed,patient verbalized understanding, also discussed discharge time is 0900 the next morning.

## 2022-08-17 DIAGNOSIS — R338 Other retention of urine: Secondary | ICD-10-CM | POA: Diagnosis not present

## 2022-08-20 ENCOUNTER — Other Ambulatory Visit: Payer: Self-pay | Admitting: Orthopedic Surgery

## 2022-08-20 DIAGNOSIS — R338 Other retention of urine: Secondary | ICD-10-CM

## 2022-08-21 DIAGNOSIS — R233 Spontaneous ecchymoses: Secondary | ICD-10-CM | POA: Diagnosis not present

## 2022-08-21 DIAGNOSIS — L821 Other seborrheic keratosis: Secondary | ICD-10-CM | POA: Diagnosis not present

## 2022-08-21 DIAGNOSIS — L218 Other seborrheic dermatitis: Secondary | ICD-10-CM | POA: Diagnosis not present

## 2022-08-21 DIAGNOSIS — L280 Lichen simplex chronicus: Secondary | ICD-10-CM | POA: Diagnosis not present

## 2022-08-21 DIAGNOSIS — L738 Other specified follicular disorders: Secondary | ICD-10-CM | POA: Diagnosis not present

## 2022-08-21 DIAGNOSIS — L570 Actinic keratosis: Secondary | ICD-10-CM | POA: Diagnosis not present

## 2022-08-21 NOTE — H&P (Signed)
Patient is a 77 year old white male seen today for evaluation of lower urinary tract symptoms. Over the last 6 weeks patient noted markedly decreased voided volumes, dysuria decreased force of stream's and feelings of incomplete emptying. Was seen by his PCP initially treated with an antibiotic with no relief of symptoms. Also placed on finasteride for presumed enlarged prostate. There is no family history of prostate cancer. Here for evaluation and management of his lower urinary tract symptoms.  Micro urinalysis is clear on urine spun sediment  Postvoid residual today is 374 cc  -02/06/22-patient with history of BPH and elevated residual. Had been initiated on finasteride by his PCP and we added silodosin 8 mg daily. Now here for 6-week follow-up. In the interim he has noted perhaps mild improvement but significant issues feeling like he is not emptying his bladder.  Micro urinalysis urine spun sediment  Postvoid residual equals: 209 cc, this is down from 374 cc initially before starting silodosin.   -03/22/22-patient with history of BPH and elevated residual. Has been on finasteride and recently added silodosin 8 mg daily. Objectively postvoid residual went down and we opted to observe for 3 months to see that of the effectiveness of finasteride over time. He returns today with difficulty voiding and slow urinary stream feelings of incomplete emptying.  Urinalysis shows 6-10 WBCs  Postvoid residuals greater than 999 cc   04/07/2022: David [name] is a 77 year old man who is currently managed for BPH with finasteride and silodosin 8 mg daily. A few weeks ago he developed urinary retention and a Foley catheter was placed with over 900 mL drained from his bladder. He currently has a Foley catheter in place now and presents for trial of void. He denies fevers, chills, gross hematuria.   04/12/22: David [name] presents today for a repeat trial of void. He is on Tamsulosin and Finasteride. He is tolerating the  catheter well. He denies blood in the catheter bag, fevers, chills, and suprapubic pain.  -05/08/22-patient with history of BPH and recent urinary retention. He remains on tamsulosin 0.4 mg daily and finasteride 5 mg daily. Here now for cystoscopy to assess prostate and bladder. Failed recent voiding trial on 04/12/2022.  Cystoscopy is performed today and shows: Short prostatic urethra about 2 cm with mild bilobar prostatic hypertrophy. Bladder appeared somewhat trabeculated, there was a large amount of sediment which made visualization difficult but no obvious mucosal lesions. He does not have any significant median lobe hypertrophy. 250 cc was instilled and then had back pressure to the scope. Scope was removed and attempted to void. Patient was unable to void. Foley was replaced  -06/16/22-patient with history of BPH and recent urinary retention. He has failed voiding trials despite being on tamsulosin and finasteride. Had recent urodynamics which shows markedly unstable bladder with maximum capacity 500 cc but markedly delayed sensation. Maximum detrusor pressure was 31 cmH2O and patient was unable to void.    URODYNAMICS STUDY   Test Indication: Urinary Retention  The procedure's risks, benefits and infection risk were discussed with the patient.   PRE UROFLOW & CATHETERIZATION  Procedure: Pre Uroflow Study  Arrived with a 18 Coude catheter.  A Urodynamic catheter was inserted.   CYSTOMETRY/CYSTOMETROGRAM  The bladder was filled with room temperature water at a rate of less than 50 cc per minute.  Injection of contrast was performed for the cystometrogram.  Max capacity was approx. 500 mls. Patient went from no sensation to a strong desire with each unstable contraction.  The bladder was unstable. Multiple low amplitude bladder spasms noted on the study today.   First unstable contraction occurred at 445 mls.  Max unstable detrusor contraction was 16 cmH20.  He felt an increase urge at  the time but was able to inhibit leaking.   Rectal activity was noted on the study as well.   LEAK POINT PRESSURE  LPPs were assessed with pt in a seated position.  No leakage was noted with abdominal pressures of cmH20.   PRESSURE FLOW STUDY  He was able to generate a voluntary contraction but did not void  Max detrusor pressure while attempting to void was 31 cmH20  Max capacity was 500 mls.   ELECTROMYOGRAM  Activity was measured by surface electrodes  There was no voiding phase.   FLUOROSCOPY and VCUG  Trabeculation and some elevation at the bladder base was noted. No reflux seen.  Pt confirmed no Neulasta OnPro Device.   POST PROCEDURE ANTIBIOTICS:  He was advised to watch for s/s of a break through UTI and to call out office or go to the ED is he develops any of them. .   UDS SUMMARY  David [name] held a max capacity of approx. 500 mls. There was positive low amplitude instability. Multiple low amplitude bladder spasms noted on the study. He felt an increase urge at the time but was able to inhibit leaking. Rectal activity was noted on the study as well. He was able to generate a voluntary contraction but did not void. Max detrusor pressure while attempting to void was 31 cmH20. Trabeculation and some elevation of the bladder base was noted. No reflux was seen. An 18 Coude catheter was reinserted before the patient left the clinic.  -07/17/22-patient with urinary retention and low amplitude bladder pressures per urodynamics with some bladder instability as above. I reviewed urodynamic studies with Dr. Matilde Sprang and after review of the study probably there is enough voiding pressure that would warrant trying an outlet procedure. I did discuss this with the patient and told him that 1 risk would be that his bladder instability could cause some urgency and urge incontinence if we opened up the prostate. I did think it was probably worth trying an outlet procedure such as TURP to see if we  can get him out of retention. Patient remains on silodosin and finasteride.  Voiding trial: 180 cc instilled, 80 cc voided, Foley was left out for trial of void today. He will return by 2 PM if unable to void for PVR. If voiding satisfactorily we will see back in a week for follow-up PVR     ALLERGIES: Celebrex - Trouble Breathing, Hives Erythromycin - Hives oxycodone - Hives Oxycontin - Hives penicillin - Hives    MEDICATIONS: Finasteride 5 mg tablet 1 tablet PO Daily  Silodosin 8 mg capsule 1 capsule PO Daily  Finasteride 5 mg tablet 1 tablet PO Daily  Loratadine 10 mg tablet 1 tablet PO Daily  Losartan Potassium 50 mg tablet 1 tablet PO Daily  Simvastatin 20 mg tablet 1 tablet PO Daily     Notes: Silodosin not working for him.   GU PSH: Complex cystometrogram, w/ void pressure and urethral pressure profile studies, any technique - 05/17/2022 Complex Uroflow - 05/17/2022 Cystoscopy - 05/08/2022 Emg surf Electrd - 05/17/2022 Inject For cystogram - 05/17/2022 Intrabd voidng Press - 05/17/2022     NON-GU PSH: Hemorrhoidectomy     GU PMH: BPH w/LUTS - 06/16/2022, - 05/08/2022, - 03/22/2022, - 02/06/2022, -  12/23/2021 Urinary Retention - 06/16/2022, - 05/17/2022, - 05/08/2022, - 04/11/2022, - 04/07/2022, - 03/22/2022 Incomplete bladder emptying - 02/06/2022, - 12/23/2021 Dysuria - 12/23/2021    NON-GU PMH: Pyuria/other UA findings - 03/22/2022 Anxiety Depression Hypercholesterolemia Hypertension Tuberculosis of lung    FAMILY HISTORY: Diabetes - Sister, Mother heart failure - Mother Kidney Failure - Mother stroke - Father   SOCIAL HISTORY: Marital Status: Married Ethnicity: Not Hispanic Or Latino; Race: White Current Smoking Status: Patient does not smoke anymore. Has not smoked since 12/09/2015. Smoked for 50 years. Smoked 2 packs per day.   Tobacco Use Assessment Completed: Used Tobacco in last 30 days? Has never drank.  Does not use drugs. Does not drink caffeine.    REVIEW  OF SYSTEMS:    GU Review Male:   Patient denies frequent urination, hard to postpone urination, burning/ pain with urination, get up at night to urinate, leakage of urine, stream starts and stops, trouble starting your stream, have to strain to urinate , erection problems, and penile pain.  Gastrointestinal (Upper):   Patient denies nausea, vomiting, and indigestion/ heartburn.  Gastrointestinal (Lower):   Patient denies constipation and diarrhea.  Constitutional:   Patient denies fever, night sweats, weight loss, and fatigue.  Skin:   Patient denies skin rash/ lesion and itching.  Eyes:   Patient denies blurred vision and double vision.  Ears/ Nose/ Throat:   Patient denies sore throat and sinus problems.  Hematologic/Lymphatic:   Patient denies swollen glands and easy bruising.  Cardiovascular:   Patient denies leg swelling and chest pains.  Respiratory:   Patient denies cough and shortness of breath.  Endocrine:   Patient denies excessive thirst.  Musculoskeletal:   Patient denies back pain and joint pain.  Neurological:   Patient denies headaches and dizziness.  Psychologic:   Patient denies depression and anxiety.   Notes: catheter in place    VITAL SIGNS: None   GU PHYSICAL EXAMINATION:    Urethral Meatus: Normal size. No lesion, no wart, no discharge, no polyp. Normal location.  Penis: Circumcised, no warts, no cracks. No dorsal Peyronie's plaques, no left corporal Peyronie's plaques, no right corporal Peyronie's plaques, no scarring, no warts. No balanitis, no meatal stenosis.   MULTI-SYSTEM PHYSICAL EXAMINATION:    Constitutional: Well-nourished. No physical deformities. Normally developed. Good grooming.  Neck: Neck symmetrical, not swollen. Normal tracheal position.  Respiratory: No labored breathing, no use of accessory muscles.   Cardiovascular: Normal temperature, normal extremity pulses, no swelling, no varicosities.  Lymphatic: No enlargement of neck, axillae, groin.   Skin: No paleness, no jaundice, no cyanosis. No lesion, no ulcer, no rash.  Neurologic / Psychiatric: Oriented to time, oriented to place, oriented to person. No depression, no anxiety, no agitation.  Gastrointestinal: No mass, no tenderness, no rigidity, non obese abdomen.  Eyes: Normal conjunctivae. Normal eyelids.  Ears, Nose, Mouth, and Throat: Left ear no scars, no lesions, no masses. Right ear no scars, no lesions, no masses. Nose no scars, no lesions, no masses. Normal hearing. Normal lips.  Musculoskeletal: Normal gait and station of head and neck.     Complexity of Data:  Source Of History:  Patient  Records Review:   Previous Doctor Records, Previous Patient Records  Urine Test Review:   Urinalysis   PROCEDURES:         Voiding Trial - 51700  Voided Volume: 100 cc  Instilled Volume: 180 cc   ASSESSMENT:      ICD-10 Details  1 GU:  BPH w/LUTS - 123XX123 Acute, Complicated Injury  2   Urinary Retention - XX123456 Acute, Complicated Injury   PLAN:           Schedule Return Visit/Planned Activity: 1 Week - Office Visit, PVR          Document Letter(s):  Created for Patient: Clinical Summary         Notes:   Foley was left out as he seems to void 100 of the 180 cc instilled. Plan to see back in 1 week for follow-up PVR or later this afternoon if unable to void by 2 PM.  Addendum: Patient returned at 2 PM unable to void. 400 cc on residual Foley replaced. Plan for scheduling cystoscopy and TURP. Risk and benefits procedure discussed as outlined below. Patient agreeable  I have discussed with the patient the risks, benefits and alternatives of trans urethral resection of prostate which includes but is not limited to: Bleeding, sometimes requiring transfusion of blood, TUR syndrome, prolonged Foley catheter drainage, infection, urinary incontinence, urinary retention, damage to surrounding organs, need for possible additional procedures, possibility of nonhealing area leading to  recurrent hematuria, retrograde ejaculation, erectile dysfunction, and urgency and frequency which can be refractory to medication,. Typically the hospital stay will be overnight with a catheter in place up to a week. The patient's expected recovery. With irritative voiding symptoms and intermittent hematuria for up to a month. The patient voices understanding of the risks and benefits of the TURP procedure and consents to proceed.

## 2022-08-21 NOTE — Anesthesia Preprocedure Evaluation (Signed)
Anesthesia Evaluation  Patient identified by MRN, date of birth, ID band Patient awake    Reviewed: Allergy & Precautions, NPO status , Patient's Chart, lab work & pertinent test results  Airway Mallampati: IV  TM Distance: >3 FB Neck ROM: Full    Dental  (+) Edentulous Upper, Edentulous Lower   Pulmonary sleep apnea (pt denies ever being diagnosed with this) , former smoker Quit smoking 2017, 83 pack year history    Pulmonary exam normal breath sounds clear to auscultation       Cardiovascular hypertension (167/86 preop, doesnt know where he normally runs), Pt. on medications + Peripheral Vascular Disease  Normal cardiovascular exam Rhythm:Regular Rate:Normal     Neuro/Psych  Headaches PSYCHIATRIC DISORDERS Anxiety Depression       GI/Hepatic negative GI ROS, Neg liver ROS,,,  Endo/Other  diabetes (prediabetic)    Renal/GU negative Renal ROS  negative genitourinary   Musculoskeletal  (+) Arthritis , Osteoarthritis,    Abdominal   Peds  Hematology negative hematology ROS (+)   Anesthesia Other Findings   Reproductive/Obstetrics negative OB ROS                             Anesthesia Physical Anesthesia Plan  ASA: 3  Anesthesia Plan: General   Post-op Pain Management: Tylenol PO (pre-op)*   Induction: Intravenous  PONV Risk Score and Plan: 3 and Ondansetron, Dexamethasone and Treatment may vary due to age or medical condition  Airway Management Planned: LMA  Additional Equipment: None  Intra-op Plan:   Post-operative Plan: Extubation in OR  Informed Consent: I have reviewed the patients History and Physical, chart, labs and discussed the procedure including the risks, benefits and alternatives for the proposed anesthesia with the patient or authorized representative who has indicated his/her understanding and acceptance.     Dental advisory given  Plan Discussed with:  CRNA  Anesthesia Plan Comments:         Anesthesia Quick Evaluation

## 2022-08-22 ENCOUNTER — Ambulatory Visit (HOSPITAL_BASED_OUTPATIENT_CLINIC_OR_DEPARTMENT_OTHER): Payer: Medicare PPO | Admitting: Anesthesiology

## 2022-08-22 ENCOUNTER — Encounter (HOSPITAL_BASED_OUTPATIENT_CLINIC_OR_DEPARTMENT_OTHER): Payer: Self-pay | Admitting: Urology

## 2022-08-22 ENCOUNTER — Ambulatory Visit (HOSPITAL_BASED_OUTPATIENT_CLINIC_OR_DEPARTMENT_OTHER)
Admission: RE | Admit: 2022-08-22 | Discharge: 2022-08-23 | Disposition: A | Discharge status: Home / Self Care | Payer: Medicare PPO | Source: Ambulatory Visit | Attending: Urology | Admitting: Urology

## 2022-08-22 ENCOUNTER — Encounter (HOSPITAL_BASED_OUTPATIENT_CLINIC_OR_DEPARTMENT_OTHER)
Admission: RE | Disposition: A | Discharge status: Home / Self Care | Payer: Self-pay | Source: Ambulatory Visit | Attending: Urology

## 2022-08-22 DIAGNOSIS — N400 Enlarged prostate without lower urinary tract symptoms: Secondary | ICD-10-CM | POA: Diagnosis present

## 2022-08-22 DIAGNOSIS — I10 Essential (primary) hypertension: Secondary | ICD-10-CM | POA: Diagnosis not present

## 2022-08-22 DIAGNOSIS — Z833 Family history of diabetes mellitus: Secondary | ICD-10-CM | POA: Diagnosis not present

## 2022-08-22 DIAGNOSIS — Z87891 Personal history of nicotine dependence: Secondary | ICD-10-CM | POA: Insufficient documentation

## 2022-08-22 DIAGNOSIS — C61 Malignant neoplasm of prostate: Secondary | ICD-10-CM | POA: Insufficient documentation

## 2022-08-22 DIAGNOSIS — N401 Enlarged prostate with lower urinary tract symptoms: Secondary | ICD-10-CM | POA: Insufficient documentation

## 2022-08-22 DIAGNOSIS — R338 Other retention of urine: Secondary | ICD-10-CM

## 2022-08-22 DIAGNOSIS — Z8249 Family history of ischemic heart disease and other diseases of the circulatory system: Secondary | ICD-10-CM | POA: Insufficient documentation

## 2022-08-22 DIAGNOSIS — I739 Peripheral vascular disease, unspecified: Secondary | ICD-10-CM | POA: Diagnosis not present

## 2022-08-22 DIAGNOSIS — R7303 Prediabetes: Secondary | ICD-10-CM | POA: Diagnosis not present

## 2022-08-22 DIAGNOSIS — G4730 Sleep apnea, unspecified: Secondary | ICD-10-CM | POA: Insufficient documentation

## 2022-08-22 DIAGNOSIS — E1151 Type 2 diabetes mellitus with diabetic peripheral angiopathy without gangrene: Secondary | ICD-10-CM

## 2022-08-22 HISTORY — PX: CYSTOSCOPY: SHX5120

## 2022-08-22 HISTORY — PX: TRANSURETHRAL RESECTION OF PROSTATE: SHX73

## 2022-08-22 HISTORY — DX: Prediabetes: R73.03

## 2022-08-22 SURGERY — TURP (TRANSURETHRAL RESECTION OF PROSTATE)
Anesthesia: General | Site: Ureter

## 2022-08-22 MED ORDER — LIDOCAINE HCL (PF) 2 % IJ SOLN
INTRAMUSCULAR | Status: AC
  Filled 2022-08-22: qty 5

## 2022-08-22 MED ORDER — SODIUM CHLORIDE 0.9 % IR SOLN
Status: DC | PRN
  Administered 2022-08-22 (×2): 3000 mL

## 2022-08-22 MED ORDER — SODIUM CHLORIDE 0.9 % IV SOLN: INTRAVENOUS | Status: DC

## 2022-08-22 MED ORDER — PROPOFOL 10 MG/ML IV BOLUS
INTRAVENOUS | Status: DC | PRN
  Administered 2022-08-22: 200 mg via INTRAVENOUS
  Administered 2022-08-22: 100 mg via INTRAVENOUS

## 2022-08-22 MED ORDER — EPHEDRINE SULFATE-NACL 50-0.9 MG/10ML-% IV SOSY
PREFILLED_SYRINGE | INTRAVENOUS | Status: DC | PRN
  Administered 2022-08-22 (×3): 5 mg via INTRAVENOUS
  Administered 2022-08-22: 10 mg via INTRAVENOUS

## 2022-08-22 MED ORDER — FENTANYL CITRATE (PF) 100 MCG/2ML IJ SOLN
INTRAMUSCULAR | Status: DC | PRN
  Administered 2022-08-22: 100 ug via INTRAVENOUS

## 2022-08-22 MED ORDER — PROPOFOL 10 MG/ML IV BOLUS
INTRAVENOUS | Status: AC
  Filled 2022-08-22: qty 20

## 2022-08-22 MED ORDER — CIPROFLOXACIN IN D5W 400 MG/200ML IV SOLN
INTRAVENOUS | Status: AC
  Filled 2022-08-22: qty 200

## 2022-08-22 MED ORDER — HYDROCODONE-ACETAMINOPHEN 5-325 MG PO TABS
ORAL_TABLET | ORAL | Status: AC
  Filled 2022-08-22: qty 1

## 2022-08-22 MED ORDER — ONDANSETRON HCL 4 MG/2ML IJ SOLN: 4.0000 mg | INTRAMUSCULAR | Status: DC | PRN

## 2022-08-22 MED ORDER — FENTANYL CITRATE (PF) 100 MCG/2ML IJ SOLN
INTRAMUSCULAR | Status: AC
  Filled 2022-08-22: qty 2

## 2022-08-22 MED ORDER — 0.9 % SODIUM CHLORIDE (POUR BTL) OPTIME
TOPICAL | Status: DC | PRN
  Administered 2022-08-22: 500 mL

## 2022-08-22 MED ORDER — ACETAMINOPHEN 500 MG PO TABS
1000.0000 mg | ORAL_TABLET | Freq: Once | ORAL | Status: AC
  Administered 2022-08-22: 1000 mg via ORAL

## 2022-08-22 MED ORDER — CEFAZOLIN SODIUM-DEXTROSE 2-4 GM/100ML-% IV SOLN: 2.0000 g | INTRAVENOUS | Status: DC

## 2022-08-22 MED ORDER — DEXAMETHASONE SODIUM PHOSPHATE 10 MG/ML IJ SOLN
INTRAMUSCULAR | Status: AC
  Filled 2022-08-22: qty 1

## 2022-08-22 MED ORDER — CEFAZOLIN SODIUM-DEXTROSE 2-4 GM/100ML-% IV SOLN
INTRAVENOUS | Status: AC
  Filled 2022-08-22: qty 100

## 2022-08-22 MED ORDER — LACTATED RINGERS IV SOLN: INTRAVENOUS | Status: DC

## 2022-08-22 MED ORDER — ACETAMINOPHEN 500 MG PO TABS
ORAL_TABLET | ORAL | Status: AC
  Filled 2022-08-22: qty 2

## 2022-08-22 MED ORDER — FINASTERIDE 5 MG PO TABS
5.0000 mg | ORAL_TABLET | Freq: Every day | ORAL | Status: DC
  Administered 2022-08-22: 5 mg via ORAL
  Filled 2022-08-22: qty 1

## 2022-08-22 MED ORDER — ONDANSETRON HCL 4 MG/2ML IJ SOLN: 4.0000 mg | Freq: Once | INTRAMUSCULAR | Status: DC | PRN

## 2022-08-22 MED ORDER — HYDROCODONE-ACETAMINOPHEN 5-325 MG PO TABS
1.0000 | ORAL_TABLET | ORAL | Status: DC | PRN
  Administered 2022-08-22 – 2022-08-23 (×3): 1 via ORAL

## 2022-08-22 MED ORDER — ONDANSETRON HCL 4 MG/2ML IJ SOLN
INTRAMUSCULAR | Status: DC | PRN
  Administered 2022-08-22: 4 mg via INTRAVENOUS

## 2022-08-22 MED ORDER — SODIUM CHLORIDE 0.9 % IR SOLN
3000.0000 mL | Status: DC
  Administered 2022-08-22: 3000 mL

## 2022-08-22 MED ORDER — CIPROFLOXACIN IN D5W 400 MG/200ML IV SOLN
400.0000 mg | Freq: Once | INTRAVENOUS | Status: AC
  Administered 2022-08-22: 400 mg via INTRAVENOUS

## 2022-08-22 MED ORDER — HYDROCODONE-ACETAMINOPHEN 7.5-325 MG PO TABS: 1.0000 | ORAL_TABLET | Freq: Once | ORAL | Status: DC | PRN

## 2022-08-22 MED ORDER — TRAZODONE HCL 100 MG PO TABS
100.0000 mg | ORAL_TABLET | Freq: Every day | ORAL | Status: DC
  Administered 2022-08-22: 100 mg via ORAL
  Filled 2022-08-22: qty 1

## 2022-08-22 MED ORDER — OXYBUTYNIN CHLORIDE 5 MG PO TABS: 5.0000 mg | ORAL_TABLET | Freq: Three times a day (TID) | ORAL | Status: DC | PRN

## 2022-08-22 MED ORDER — LIDOCAINE 2% (20 MG/ML) 5 ML SYRINGE
INTRAMUSCULAR | Status: DC | PRN
  Administered 2022-08-22: 60 mg via INTRAVENOUS

## 2022-08-22 MED ORDER — SODIUM CHLORIDE 0.9 % IR SOLN
Status: DC | PRN
  Administered 2022-08-22 (×3): 3000 mL

## 2022-08-22 MED ORDER — AMISULPRIDE (ANTIEMETIC) 5 MG/2ML IV SOLN: 10.0000 mg | Freq: Once | INTRAVENOUS | Status: DC | PRN

## 2022-08-22 MED ORDER — SIMVASTATIN 20 MG PO TABS
20.0000 mg | ORAL_TABLET | Freq: Every day | ORAL | Status: DC
  Administered 2022-08-22: 20 mg via ORAL
  Filled 2022-08-22: qty 1

## 2022-08-22 MED ORDER — HYDROMORPHONE HCL 1 MG/ML IJ SOLN: 0.2500 mg | INTRAMUSCULAR | Status: DC | PRN

## 2022-08-22 MED ORDER — LOSARTAN POTASSIUM 50 MG PO TABS
50.0000 mg | ORAL_TABLET | Freq: Every day | ORAL | Status: DC
  Administered 2022-08-22: 50 mg via ORAL
  Filled 2022-08-22: qty 1

## 2022-08-22 MED ORDER — ACETAMINOPHEN 325 MG PO TABS: 650.0000 mg | ORAL_TABLET | ORAL | Status: DC | PRN

## 2022-08-22 MED ORDER — STERILE WATER FOR IRRIGATION IR SOLN
Status: DC | PRN
  Administered 2022-08-22: 500 mL

## 2022-08-22 MED ORDER — HYDROMORPHONE HCL 1 MG/ML IJ SOLN: 0.5000 mg | INTRAMUSCULAR | Status: DC | PRN

## 2022-08-22 MED ORDER — ONDANSETRON HCL 4 MG/2ML IJ SOLN
INTRAMUSCULAR | Status: AC
  Filled 2022-08-22: qty 2

## 2022-08-22 MED ORDER — DEXAMETHASONE SODIUM PHOSPHATE 10 MG/ML IJ SOLN
INTRAMUSCULAR | Status: DC | PRN
  Administered 2022-08-22: 5 mg via INTRAVENOUS

## 2022-08-22 SURGICAL SUPPLY — 40 items
BAG DRAIN URO-CYSTO SKYTR STRL (DRAIN) ×3 IMPLANT
BAG DRN RND TRDRP ANRFLXCHMBR (UROLOGICAL SUPPLIES) ×2
BAG DRN UROCATH (DRAIN) ×2
BAG URINE DRAIN 2000ML AR STRL (UROLOGICAL SUPPLIES) ×3 IMPLANT
BAG URINE LEG 500ML (DRAIN) IMPLANT
BULB IRRIG PATHFIND (MISCELLANEOUS) IMPLANT
CATH FOLEY 3WAY 30CC 22FR (CATHETERS) IMPLANT
CATH HEMA 3WAY 30CC 22FR COUDE (CATHETERS) ×3 IMPLANT
CATH HEMA 3WAY 30CC 24FR COUDE (CATHETERS) IMPLANT
CATH HEMA 3WAY 30CC 24FR RND (CATHETERS) IMPLANT
CATH URET 5FR 28IN CONE TIP (BALLOONS)
CATH URET 5FR 70CM CONE TIP (BALLOONS) IMPLANT
CATH URETL OPEN 5X70 (CATHETERS) IMPLANT
CLOTH BEACON ORANGE TIMEOUT ST (SAFETY) ×3 IMPLANT
ELECT REM PT RETURN 9FT ADLT (ELECTROSURGICAL)
ELECTRODE REM PT RTRN 9FT ADLT (ELECTROSURGICAL) IMPLANT
EVACUATOR MICROVAS BLADDER (UROLOGICAL SUPPLIES) IMPLANT
FIBER LASER FLEXIVA 365 (UROLOGICAL SUPPLIES) IMPLANT
GLOVE BIO SURGEON STRL SZ7.5 (GLOVE) ×3 IMPLANT
GOWN STRL REUS W/TWL LRG LVL3 (GOWN DISPOSABLE) ×3 IMPLANT
GUIDEWIRE ANG ZIPWIRE 038X150 (WIRE) IMPLANT
GUIDEWIRE STR DUAL SENSOR (WIRE) IMPLANT
HOLDER FOLEY CATH W/STRAP (MISCELLANEOUS) IMPLANT
IV NS IRRIG 3000ML ARTHROMATIC (IV SOLUTION) ×6 IMPLANT
KIT TURNOVER CYSTO (KITS) ×3 IMPLANT
LOOP CUT BIPOLAR 24F LRG (ELECTROSURGICAL) ×3 IMPLANT
MANIFOLD NEPTUNE II (INSTRUMENTS) ×3 IMPLANT
NS IRRIG 500ML POUR BTL (IV SOLUTION) IMPLANT
PACK CYSTO (CUSTOM PROCEDURE TRAY) ×3 IMPLANT
PLUG CATH AND CAP STER (CATHETERS) IMPLANT
SHEATH NAVIGATOR HD 11/13X36 (SHEATH) IMPLANT
SYR 20ML LL LF (SYRINGE) ×3 IMPLANT
SYR 30ML LL (SYRINGE) ×3 IMPLANT
SYR TOOMEY IRRIG 70ML (MISCELLANEOUS)
SYRINGE TOOMEY IRRIG 70ML (MISCELLANEOUS) ×3 IMPLANT
TRACTIP FLEXIVA PULS ID 200XHI (Laser) IMPLANT
TRACTIP FLEXIVA PULSE ID 200 (Laser)
TUBE CONNECTING 12X1/4 (SUCTIONS) IMPLANT
TUBING UROLOGY SET (TUBING) ×3 IMPLANT
WATER STERILE IRR 500ML POUR (IV SOLUTION) IMPLANT

## 2022-08-22 NOTE — Interval H&P Note (Signed)
History and Physical Interval Note:  08/22/2022 8:50 AM  David [name].  has presented today for surgery, with the diagnosis of Brooks.  The various methods of treatment have been discussed with the patient and family. After consideration of risks, benefits and other options for treatment, the patient has consented to  Procedure(s): TRANSURETHRAL RESECTION OF THE PROSTATE (TURP) (N/A) CYSTOSCOPY (N/A) as a surgical intervention.  The patient's history has been reviewed, patient examined, no change in status, stable for surgery.  I have reviewed the patient's chart and labs.  Questions were answered to the patient's satisfaction.     Remi Haggard

## 2022-08-22 NOTE — Anesthesia Postprocedure Evaluation (Signed)
Anesthesia Post Note  Patient: David [name].  Procedure(s) Performed: TRANSURETHRAL RESECTION OF THE PROSTATE (TURP) (Prostate) CYSTOSCOPY (Ureter)     Patient location during evaluation: PACU Anesthesia Type: General Level of consciousness: awake and alert, oriented and patient cooperative Pain management: pain level controlled Vital Signs Assessment: post-procedure vital signs reviewed and stable Respiratory status: spontaneous breathing, nonlabored ventilation and respiratory function stable Cardiovascular status: blood pressure returned to baseline and stable Postop Assessment: no apparent nausea or vomiting Anesthetic complications: no   No notable events documented.  Last Vitals:  Vitals:   08/22/22 1215 08/22/22 1246  BP: (!) 156/89 (!) 172/82  Pulse: 80 (!) 6  Resp: 15 16  Temp:  36.7 C  SpO2: 93% 93%    Last Pain:  Vitals:   08/22/22 1246  TempSrc: Oral  PainSc: 0-No pain                 Pervis Hocking

## 2022-08-22 NOTE — Transfer of Care (Signed)
Immediate Anesthesia Transfer of Care Note  Patient: David [name].  Procedure(s) Performed: TRANSURETHRAL RESECTION OF THE PROSTATE (TURP) (Prostate) CYSTOSCOPY (Ureter)  Patient Location: PACU  Anesthesia Type:General  Level of Consciousness: awake, alert , oriented, and patient cooperative  Airway & Oxygen Therapy: Patient Spontanous Breathing and Patient connected to nasal cannula oxygen  Post-op Assessment: Report given to RN and Post -op Vital signs reviewed and stable  Post vital signs: Reviewed and stable  Last Vitals:  Vitals Value Taken Time  BP 142/82 08/22/22 1136  Temp    Pulse 102 08/22/22 1138  Resp 23 08/22/22 1138  SpO2 96 % 08/22/22 1138  Vitals shown include unvalidated device data.  Last Pain:  Vitals:   08/22/22 0930  TempSrc: Oral      Patients Stated Pain Goal: 5 ([id number])  Complications: No notable events documented.

## 2022-08-22 NOTE — Op Note (Signed)
Preoperative diagnosis:  1.  BPH with urinary retention  Postoperative diagnosis: 1.  Same  Procedure(s): 1.  Cystoscopy, transurethral section of prostate  Surgeon: Dr. Harold Barban  Anesthesia: General  Complications: None  EBL: Minimal  Specimens: Prostate chips  Disposition of specimens: To pathology  Intraoperative findings: Short prostatic urethra with bilobar prostatic hypertrophy approximate 2 cm prostatic urethral length.  TURP performed to create nice open channel, bladder normal  Indication: 77 year old white male with urinary retention and has failed medical therapy.  Urodynamics show low pressure bladder but likely has enough bladder pressure to empty if bladder outlet resistance is reduced.  Presents at this time and go cystoscopy and TURP  Description of procedure:  After obtaining form consent the patient was taken major cystoscopy suite placed under general anesthesia.  Placed in dorsolithotomy position genitalia prepped and draped in usual sterile fashion.  Proper pause and timeout was performed.  Previous Foley catheter removed prior to prepping.  Cystoscopy performed 21 [place].  Findings of short prostatic urethra and bilobar prostatic hypertrophy.  Bladder was inspected with no mucosal lesions.  Ureteral orifice ease were well away from bladder neck.  Cystoscope was removed the right visualized obturator was utilized to advance the resectoscope into the bladder under direct vision.  The continuous-flow bipolar resectoscope was then placed without difficulty.  Anterior commissure was released.  The lateral lobes were subsequently resected for starting at the 11:00 to 7 o'clock position resecting from the bladder neck to the level just proximal to the verumontanum taking care to spare the area of the striated sphincter.  Similar fashion left lateral lobe was resected from the 1:00 to 5 o'clock position again from the bladder neck to the level just proximal to the  verumontanum.  In this manner open channel was created through the prostate.  Hemostasis was obtained with cautery loop.  Prostate chips were simply irrigated from the bladder utilizing the Urovac evacuator.  Final look in the bladder revealed no remaining chips.  Hemostasis again obtained with the cautery loop and there was minimal bleeding noted.  Resectoscope was removed 22 [place] three-way Foley was placed saline irrigation and the effluent was clear with minimal CBI.  Procedure was terminated he was awakened from anesthesia and taken back to recovery in stable condition.  No immediate complication from the procedure.

## 2022-08-22 NOTE — Anesthesia Procedure Notes (Signed)
Procedure Name: LMA Insertion Date/Time: 08/22/2022 10:49 AM  Performed by: Rogers Blocker, CRNAPre-anesthesia Checklist: Patient identified, Emergency Drugs available, Suction available and Patient being monitored Patient Re-evaluated:Patient Re-evaluated prior to induction Oxygen Delivery Method: Circle System Utilized Preoxygenation: Pre-oxygenation with 100% oxygen Induction Type: IV induction Ventilation: Mask ventilation without difficulty LMA: LMA with gastric port inserted LMA Size: 5.0 Number of attempts: 2 (Attempt x1 with reg lma#5, unable to seat.) Placement Confirmation: positive ETCO2 Tube secured with: Tape Dental Injury: Teeth and Oropharynx as per pre-operative assessment

## 2022-08-23 ENCOUNTER — Encounter (HOSPITAL_BASED_OUTPATIENT_CLINIC_OR_DEPARTMENT_OTHER): Payer: Self-pay | Admitting: Urology

## 2022-08-23 DIAGNOSIS — N401 Enlarged prostate with lower urinary tract symptoms: Secondary | ICD-10-CM | POA: Diagnosis not present

## 2022-08-23 MED ORDER — HYDROCODONE-ACETAMINOPHEN 5-325 MG PO TABS
ORAL_TABLET | ORAL | Status: AC
  Filled 2022-08-23: qty 1

## 2022-08-23 MED ORDER — TRAMADOL HCL 50 MG PO TABS: 50.0000 mg | ORAL_TABLET | Freq: Four times a day (QID) | ORAL | 0 refills | Status: DC | PRN

## 2022-08-23 NOTE — Discharge Summary (Signed)
Date of admission: 08/22/2022  Date of discharge: 08/23/2022  Admission diagnosis: BPH with urinary retention  Discharge diagnosis: BPH with urinary retention  Secondary diagnoses:   History and Physical: For full details, please see admission history and physical. Briefly, David [name]. is a 77 y.o. year old patient with BPH and urinary retention which has required Foley over the last couple of months.  Has been unresponsive to medical therapy.  Presents at this time undergo cystoscopy and TURP.Marland Kitchen   Hospital Course: Patient was admitted on 08/22/2022 after undergoing cystoscopy and TURP.  For details procedure please see the typed operative note.  Patient did well first postoperative night and CBI was weaned.  Urine was clear first postoperative day.  CBI was discontinued and plug was placed in the irrigation port of the Foley.  Patient will be discharged home routine preoperative medications.  To be scheduled to return in 1 week for Foley removal and voiding trial.  He has our telephone number and knows to call should problems arise relative to his management in the interim  Laboratory values: No results for input(s): "HGB", "HCT" in the last 72 hours. No results for input(s): "CREATININE" in the last 72 hours.  Disposition: Home  Discharge instruction: The patient was instructed to be ambulatory but told to refrain from heavy lifting, strenuous activity, or driving.   Discharge medications:  Allergies as of 08/23/2022       Reactions   Oxycontin [oxycodone Hcl] Hives, Shortness Of Breath   Penicillins Hives, Shortness Of Breath   Celebrex [celecoxib]    Erythromycin    Tetracyclines & Related         Medication List     TAKE these medications    finasteride 5 MG tablet Commonly known as: PROSCAR TAKE 1 TABLET(5 MG) BY MOUTH DAILY   loratadine 10 MG tablet Commonly known as: CLARITIN TAKE 1 TABLET BY MOUTH DAILY AS NEEDED FOR ALLERGIES   losartan 50 MG  tablet Commonly known as: COZAAR Take one tablet by mouth daily   silodosin 8 MG Caps capsule Commonly known as: RAPAFLO Take 8 mg by mouth daily with breakfast.   simvastatin 20 MG tablet Commonly known as: ZOCOR Take 1 tablet (20 mg total) by mouth daily at 6 PM.   traMADol 50 MG tablet Commonly known as: Ultram Take 1 tablet (50 mg total) by mouth every 6 (six) hours as needed.   traZODone 100 MG tablet Commonly known as: DESYREL Take 1 tablet (100 mg total) by mouth at bedtime.        Followup:   Follow-up Information     [facility] Follow up in 1 week(s).   Why: 1 week as scheduled for Foley removal and voiding trial Contact information: [address]

## 2022-08-23 NOTE — Progress Notes (Signed)
1 Day Post-Op Subjective: Feeling well status post TURP.  Urine clear off CBI.  Minimal discomfort  Objective: Vital signs in last 24 hours: Temp:  [97.7 F (36.5 C)-98.4 F (36.9 C)] 98.1 F (36.7 C) (02/14 0645) Pulse Rate:  [72-105] 72 (02/14 0645) Resp:  [15-18] 18 (02/14 0645) BP: (137-172)/(69-89) 146/78 (02/14 0645) SpO2:  [93 %-100 %] 97 % (02/14 0645) Weight:  [97.6 kg] 97.6 kg (02/13 0930)  Intake/Output from previous day: 02/13 0701 - 02/14 0700 In: 3800 [I.V.:500; IV Piggyback:200] Out: [id number] [Urine:12400; Blood:5] Intake/Output this shift: No intake/output data recorded.  Physical Exam:  General: Alert and oriented   Lab Results: No results for input(s): "HGB", "HCT" in the last 72 hours. BMET No results for input(s): "NA", "K", "CL", "CO2", "GLUCOSE", "BUN", "CREATININE", "CALCIUM" in the last 72 hours.   Studies/Results: No results found.  Assessment/Plan: 1.  Status post TURP doing well Plan/recommendation.  Plan to discharge home with Foley catheter see back in 1 week for voiding trial.    LOS: 0 days   David [name] 08/23/2022, 9:15 AM

## 2022-08-24 LAB — SURGICAL PATHOLOGY

## 2022-08-28 ENCOUNTER — Other Ambulatory Visit: Payer: Medicare PPO

## 2022-08-28 DIAGNOSIS — I10 Essential (primary) hypertension: Secondary | ICD-10-CM

## 2022-08-28 DIAGNOSIS — R7303 Prediabetes: Secondary | ICD-10-CM

## 2022-08-28 DIAGNOSIS — E781 Pure hyperglyceridemia: Secondary | ICD-10-CM

## 2022-08-29 ENCOUNTER — Other Ambulatory Visit: Payer: Medicare PPO

## 2022-08-29 DIAGNOSIS — I10 Essential (primary) hypertension: Secondary | ICD-10-CM | POA: Diagnosis not present

## 2022-08-29 DIAGNOSIS — E781 Pure hyperglyceridemia: Secondary | ICD-10-CM | POA: Diagnosis not present

## 2022-08-29 DIAGNOSIS — R7303 Prediabetes: Secondary | ICD-10-CM | POA: Diagnosis not present

## 2022-08-30 LAB — CBC WITH DIFFERENTIAL/PLATELET
Absolute Monocytes: 510 cells/uL (ref 200–950)
Basophils Absolute: 58 cells/uL (ref 0–200)
Basophils Relative: 1 %
Eosinophils Absolute: 133 cells/uL (ref 15–500)
Eosinophils Relative: 2.3 %
HCT: 48.1 % (ref 38.5–50.0)
Hemoglobin: 15.9 g/dL (ref 13.2–17.1)
Lymphs Abs: 1276 cells/uL (ref 850–3900)
MCH: 31.2 pg (ref 27.0–33.0)
MCHC: 33.1 g/dL (ref 32.0–36.0)
MCV: 94.5 fL (ref 80.0–100.0)
MPV: 11.6 fL (ref 7.5–12.5)
Monocytes Relative: 8.8 %
Neutro Abs: 3822 cells/uL (ref 1500–7800)
Neutrophils Relative %: 65.9 %
Platelets: 207 10*3/uL (ref 140–400)
RBC: 5.09 10*6/uL (ref 4.20–5.80)
RDW: 12.6 % (ref 11.0–15.0)
Total Lymphocyte: 22 %
WBC: 5.8 10*3/uL (ref 3.8–10.8)

## 2022-08-30 LAB — COMPLETE METABOLIC PANEL WITH GFR
AG Ratio: 1.3 (calc) (ref 1.0–2.5)
ALT: 12 U/L (ref 9–46)
AST: 14 U/L (ref 10–35)
Albumin: 4.3 g/dL (ref 3.6–5.1)
Alkaline phosphatase (APISO): 74 U/L (ref 35–144)
BUN: 17 mg/dL (ref 7–25)
CO2: 29 mmol/L (ref 20–32)
Calcium: 9.7 mg/dL (ref 8.6–10.3)
Chloride: 103 mmol/L (ref 98–110)
Creat: 0.75 mg/dL (ref 0.70–1.28)
Globulin: 3.3 g/dL (calc) (ref 1.9–3.7)
Glucose, Bld: 111 mg/dL — ABNORMAL HIGH (ref 65–99)
Potassium: 4.8 mmol/L (ref 3.5–5.3)
Sodium: 140 mmol/L (ref 135–146)
Total Bilirubin: 0.5 mg/dL (ref 0.2–1.2)
Total Protein: 7.6 g/dL (ref 6.1–8.1)
eGFR: 93 mL/min/{1.73_m2} (ref >=60)

## 2022-08-30 LAB — HEMOGLOBIN A1C
Hgb A1c MFr Bld: 6 % of total Hgb — ABNORMAL HIGH (ref <5.7)
Mean Plasma Glucose: 126 mg/dL
eAG (mmol/L): 7 mmol/L

## 2022-08-30 LAB — LIPID PANEL
Cholesterol: 188 mg/dL (ref <200)
HDL: 35 mg/dL — ABNORMAL LOW (ref >=40)
LDL Cholesterol (Calc): 112 mg/dL (calc) — ABNORMAL HIGH
Non-HDL Cholesterol (Calc): 153 mg/dL (calc) — ABNORMAL HIGH (ref <130)
Total CHOL/HDL Ratio: 5.4 (calc) — ABNORMAL HIGH (ref <5.0)
Triglycerides: 287 mg/dL — ABNORMAL HIGH (ref <150)

## 2022-08-31 ENCOUNTER — Ambulatory Visit (INDEPENDENT_AMBULATORY_CARE_PROVIDER_SITE_OTHER): Payer: Medicare PPO | Admitting: Orthopedic Surgery

## 2022-08-31 ENCOUNTER — Encounter: Payer: Self-pay | Admitting: Orthopedic Surgery

## 2022-08-31 VITALS — BP 140/80 | HR 93 | Temp 96.8°F | Resp 16 | Ht 70.0 in | Wt 214.4 lb

## 2022-08-31 DIAGNOSIS — R7303 Prediabetes: Secondary | ICD-10-CM

## 2022-08-31 DIAGNOSIS — R3914 Feeling of incomplete bladder emptying: Secondary | ICD-10-CM

## 2022-08-31 DIAGNOSIS — G4709 Other insomnia: Secondary | ICD-10-CM | POA: Diagnosis not present

## 2022-08-31 DIAGNOSIS — N401 Enlarged prostate with lower urinary tract symptoms: Secondary | ICD-10-CM | POA: Diagnosis not present

## 2022-08-31 DIAGNOSIS — I10 Essential (primary) hypertension: Secondary | ICD-10-CM

## 2022-08-31 DIAGNOSIS — E781 Pure hyperglyceridemia: Secondary | ICD-10-CM

## 2022-08-31 NOTE — Patient Instructions (Signed)
Please pick up Tramadol from pharmacy. If you cannot urinate today> please call urology> EVEN IF YOU HAVE A APPOINTMENT TOMORROW> CALL TODAY

## 2022-08-31 NOTE — Progress Notes (Signed)
Careteam: Patient Care Team: Yvonna Alanis, NP as PCP - General (Adult Health Nurse Practitioner) Katy Apo, MD as Consulting Physician (Ophthalmology) Yvonna Alanis, NP as Nurse Practitioner (Adult Health Nurse Practitioner)  Seen by: Windell Moulding, AGNP-C  PLACE OF SERVICE:  [place] Directive information Does Patient Have a Medical Advance Directive?: No, Would patient like information on creating a medical advance directive?: No - Patient declined  Allergies  Allergen Reactions   Oxycontin [Oxycodone Hcl] Hives and Shortness Of Breath   Penicillins Hives and Shortness Of Breath   Celebrex [Celecoxib]    Erythromycin    Tetracyclines & Related     Chief Complaint  Patient presents with   Medical Management of Chronic Issues    4 month follow up.    Concerns     Patient complains of having surgery on prostate Tuesday and having pain.      HPI: Patient is a 77 y.o. male seen today for medical management of chronic conditions.   Labs reviewed with patient.   02/13 TURP by Dr. Milford Cage. He continues to have pain in genitourinary region and urinary frequency. Scared to drink fluids because it hurts to urinate. Urine blood tinged and low UOP. Follow up with urology tomorrow. He did not know Dr. Milford Cage sent prescription for Tramadol to pharmacy. He has not been taking anything for pain at this time. He is also upset he has urinary leakage and incontinence. Remains on finasteride and silodosin.   He did not take his cholesterol medication prior to surgery. He restarted statin yesterday.   Lung cancer screening CT cheat ordered last year, but he has not scheduled it due to limited income per patient. He also had recent TURP, see above. He would like to discuss next visit.   Remains on trazodone for insomnia.    Review of Systems:  Review of Systems  Constitutional:  Negative for chills and fever.  HENT:  Negative for sore throat.   Eyes:  Negative for blurred  vision.  Respiratory:  Negative for cough, shortness of breath and wheezing.   Cardiovascular:  Negative for chest pain and leg swelling.  Gastrointestinal:  Negative for abdominal pain and heartburn.  Genitourinary:  Positive for dysuria, frequency and hematuria. Negative for flank pain.  Musculoskeletal:  Negative for falls and joint pain.  Skin:  Negative for rash.  Neurological:  Negative for dizziness, weakness and headaches.  Psychiatric/Behavioral:  Negative for depression and memory loss. The patient has insomnia. The patient is not nervous/anxious.     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    Flatulence, eructation, and gas pain    Headache(784.0)    Hemorrhoids, external    Herpes zoster with unspecified complication    Hyperpotassemia    Insomnia, unspecified    Lumbago    Lumbago    Myalgia and myositis, unspecified    Nocturia    Obesity, unspecified    Osteoarthrosis, unspecified whether generalized or localized, unspecified site    Other abnormal blood chemistry    Other headache syndromes(339.89)    Pre-diabetes    Pure hyperglyceridemia  Rash and other nonspecific skin eruption    Rotator cuff (capsule) sprain    Routine general medical examination at a health care facility    Tobacco use disorder    Unspecified essential hypertension    Unspecified hypertrophic and atrophic condition of skin    Unspecified sleep apnea    Past Surgical History:  Procedure Laterality Date   CYSTOSCOPY N/A 08/22/2022   Procedure: CYSTOSCOPY;  Surgeon: Remi Haggard, MD;  Location: [facility];  Service: Urology;  Laterality:  N/A;   DENTAL SURGERY  05/10/2021   Per patient   HEMORRHOID SURGERY     TRANSURETHRAL RESECTION OF PROSTATE N/A 08/22/2022   Procedure: TRANSURETHRAL RESECTION OF THE PROSTATE (TURP);  Surgeon: Remi Haggard, MD;  Location: [facility];  Service: Urology;  Laterality: N/A;   tumorectomy Left    Removed tumor from his chest.   VARICOSE VEIN SURGERY     Social History:   reports that he quit smoking about 7 years ago. His smoking use included cigarettes. He has a 82.50 pack-year smoking history. He has never used smokeless tobacco. He reports that he does not drink alcohol and does not use drugs.  Family History  Problem Relation Age of Onset   Diabetes Mother    Heart disease Mother    Stroke Mother    Stroke Father     Medications: Patient's Medications  New Prescriptions   No medications on file  Previous Medications   FINASTERIDE (PROSCAR) 5 MG TABLET    TAKE 1 TABLET(5 MG) BY MOUTH DAILY   LORATADINE (CLARITIN) 10 MG TABLET    TAKE 1 TABLET BY MOUTH DAILY AS NEEDED FOR ALLERGIES   LOSARTAN (COZAAR) 50 MG TABLET    Take one tablet by mouth daily   SILODOSIN (RAPAFLO) 8 MG CAPS CAPSULE    Take 8 mg by mouth daily with breakfast.   SIMVASTATIN (ZOCOR) 20 MG TABLET    Take 1 tablet (20 mg total) by mouth daily at 6 PM.   TRAMADOL (ULTRAM) 50 MG TABLET    Take 1 tablet (50 mg total) by mouth every 6 (six) hours as needed.   TRAZODONE (DESYREL) 100 MG TABLET    Take 1 tablet (100 mg total) by mouth at bedtime.  Modified Medications   No medications on file  Discontinued Medications   No medications on file    Physical Exam:  Vitals:   08/31/22 0854  BP: (!) 140/80  Pulse: 93  Resp: 16  Temp: (!) 96.8 F (36 C)  SpO2: 94%  Weight: 214 lb 6.4 oz (97.3 kg)  Height: 5' 10"$  (1.778 m)   Body mass index is 30.76 kg/m. Wt Readings from Last 3 Encounters:  08/31/22 214 lb 6.4 oz (97.3 kg)  08/22/22 215 lb 1.6 oz (97.6 kg)  04/27/22 214 lb (97.1 kg)     Physical Exam Vitals reviewed.  Constitutional:      General: He is not in acute distress. HENT:     Head: Normocephalic.  Eyes:     General:        Right eye: No discharge.        Left eye: No discharge.  Cardiovascular:     Rate and Rhythm: Normal rate and regular rhythm.     Pulses: Normal pulses.     Heart sounds: Normal heart sounds.  Pulmonary:     Effort: Pulmonary effort is normal. No respiratory distress.     Breath sounds:  Normal breath sounds. No wheezing.  Abdominal:     General: Bowel sounds are normal. There is no distension.     Palpations: Abdomen is soft.     Tenderness: There is no abdominal tenderness.  Musculoskeletal:     Cervical back: Neck supple.     Right lower leg: No edema.     Left lower leg: No edema.  Skin:    General: Skin is warm and dry.     Capillary Refill: Capillary refill takes less than 2 seconds.  Neurological:     General: No focal deficit present.     Mental Status: He is alert and oriented to person, place, and time.     Motor: No weakness.     Gait: Gait normal.  Psychiatric:        Mood and Affect: Mood normal.        Behavior: Behavior normal.     Labs reviewed: Basic Metabolic Panel: Recent Labs    12/15/21 1216 04/17/22 0816 08/29/22 0000  NA 143 138 140  K 5.2 4.9 4.8  CL 105 104 103  CO2 25 28 29  $ GLUCOSE 129 116* 111*  BUN 16 19 17  $ CREATININE 0.76 0.81 0.75  CALCIUM 9.5 9.5 9.7   Liver Function Tests: Recent Labs    10/10/21 0811 04/17/22 0816 08/29/22 0000  AST 14 14 14  $ ALT 11 10 12  $ BILITOT 0.4 0.5 0.5  PROT 7.2 7.2 7.6   No results for input(s): "LIPASE", "AMYLASE" in the last 8760 hours. No results for input(s): "AMMONIA" in the last 8760 hours. CBC: Recent Labs    12/15/21 1216 04/17/22 0816 08/29/22 0000  WBC 5.1 5.5 5.8  NEUTROABS 3,366 3,108 3,822  HGB 15.4 14.5 15.9  HCT 45.5 42.7 48.1  MCV 93.8 93.2 94.5  PLT 194 185 207   Lipid Panel: Recent Labs    10/10/21 0811  04/17/22 0816 08/29/22 0000  CHOL 170 151 188  HDL 37* 41 35*  LDLCALC 97 78 112*  TRIG 253* 234* 287*  CHOLHDL 4.6 3.7 5.4*   TSH: No results for input(s): "TSH" in the last 8760 hours. A1C: Lab Results  Component Value Date   HGBA1C 6.0 (H) 08/29/2022     Assessment/Plan 1. Benign prostatic hyperplasia with incomplete bladder emptying - followed by Dr. Milford Cage urology - 02/13 TURP  - continues to have pain with urination, urinary frequency with low UOP and blood- tinged urine - f/u with urology tomorrow> advised to contact them today if he cannot urinate - cont finasteride and silodosin  2. Essential hypertension - elevated> suspect due to pain> see above - BUN/creat 17/0.75 08/29/2022 - cont losartan - cont diet low in sodium  3. Hypertriglyceridemia - elevated at 287 (08/2022)> was 234 (04/2022)> was 253 (10/2021) - cont statin - advised to limit carbs in diet  4. Prediabetes - A1c stable at 6.0> was 5.9 (04/2022) - diet controlled - cont to limit carbs and sugars in diet  5. Other insomnia - cont trazodone  Future labs/tests: cbc/diff, cmp, A1c, lipid panel, discuss CT chest r/o lung cancer, MMSE  Total time: 35 minutes. Greater than 50% of total time spent doing patient education regarding TURP, BPH, HTN, HLD and prediabetes including symptom/medication management.    Next appt: 12/28/2022  Windell Moulding, [facility] [phone]

## 2022-09-07 DIAGNOSIS — N401 Enlarged prostate with lower urinary tract symptoms: Secondary | ICD-10-CM | POA: Diagnosis not present

## 2022-09-07 DIAGNOSIS — R338 Other retention of urine: Secondary | ICD-10-CM | POA: Diagnosis not present

## 2022-09-19 DIAGNOSIS — R338 Other retention of urine: Secondary | ICD-10-CM | POA: Diagnosis not present

## 2022-09-20 ENCOUNTER — Other Ambulatory Visit: Payer: Self-pay | Admitting: Orthopedic Surgery

## 2022-09-20 DIAGNOSIS — E781 Pure hyperglyceridemia: Secondary | ICD-10-CM

## 2022-09-21 DIAGNOSIS — L570 Actinic keratosis: Secondary | ICD-10-CM | POA: Diagnosis not present

## 2022-09-21 DIAGNOSIS — D225 Melanocytic nevi of trunk: Secondary | ICD-10-CM | POA: Diagnosis not present

## 2022-09-21 DIAGNOSIS — T490X5A Adverse effect of local antifungal, anti-infective and anti-inflammatory drugs, initial encounter: Secondary | ICD-10-CM | POA: Diagnosis not present

## 2022-09-21 DIAGNOSIS — L814 Other melanin hyperpigmentation: Secondary | ICD-10-CM | POA: Diagnosis not present

## 2022-09-21 DIAGNOSIS — L218 Other seborrheic dermatitis: Secondary | ICD-10-CM | POA: Diagnosis not present

## 2022-09-21 DIAGNOSIS — L821 Other seborrheic keratosis: Secondary | ICD-10-CM | POA: Diagnosis not present

## 2022-09-28 DIAGNOSIS — C61 Malignant neoplasm of prostate: Secondary | ICD-10-CM | POA: Diagnosis not present

## 2022-09-28 LAB — PSA: PSA: 33.5

## 2022-10-02 ENCOUNTER — Other Ambulatory Visit (HOSPITAL_COMMUNITY): Payer: Self-pay | Admitting: Adult Health

## 2022-10-02 DIAGNOSIS — C61 Malignant neoplasm of prostate: Secondary | ICD-10-CM

## 2022-10-11 DIAGNOSIS — R338 Other retention of urine: Secondary | ICD-10-CM | POA: Diagnosis not present

## 2022-10-19 ENCOUNTER — Encounter: Payer: Self-pay | Admitting: Orthopedic Surgery

## 2022-10-19 ENCOUNTER — Ambulatory Visit (INDEPENDENT_AMBULATORY_CARE_PROVIDER_SITE_OTHER): Payer: Medicare PPO | Admitting: Orthopedic Surgery

## 2022-10-19 VITALS — BP 120/86 | HR 75 | Temp 97.7°F | Resp 16 | Ht 70.0 in | Wt 213.8 lb

## 2022-10-19 DIAGNOSIS — Z0000 Encounter for general adult medical examination without abnormal findings: Secondary | ICD-10-CM | POA: Diagnosis not present

## 2022-10-19 NOTE — Progress Notes (Signed)
Subjective:   David [name]. is a 77 y.o. male who presents for Medicare Annual/Subsequent preventive examination.  Review of Systems     Cardiac Risk Factors include: advanced age (>34men, >35 women);hypertension;male gender;obesity (BMI >30kg/m2);sedentary lifestyle     Objective:    Today's Vitals   10/19/22 0845  BP: 120/86  Pulse: 75  Resp: 16  Temp: 97.7 F (36.5 C)  SpO2: 92%  Weight: 213 lb 12.8 oz (97 kg)  Height: 5\' 10"  (1.778 m)   Body mass index is 30.68 kg/m.     10/19/2022    8:51 AM 08/31/2022    8:56 AM 08/22/2022    9:23 AM 04/27/2022    8:52 AM 01/04/2022    3:56 PM 12/15/2021   11:58 AM 11/24/2021    1:21 PM  Advanced Directives  Does Patient Have a Medical Advance Directive? No No No No No No No  Would patient like information on creating a medical advance directive? No - Patient declined No - Patient declined No - Patient declined  No - Patient declined No - Patient declined No - Patient declined    Current Medications (verified) Outpatient Encounter Medications as of 10/19/2022  Medication Sig   finasteride (PROSCAR) 5 MG tablet TAKE 1 TABLET(5 MG) BY MOUTH DAILY   loratadine (CLARITIN) 10 MG tablet TAKE 1 TABLET BY MOUTH DAILY AS NEEDED FOR ALLERGIES   losartan (COZAAR) 50 MG tablet Take one tablet by mouth daily   silodosin (RAPAFLO) 8 MG CAPS capsule Take 8 mg by mouth daily with breakfast.   simvastatin (ZOCOR) 20 MG tablet TAKE 1 TABLET(20 MG) BY MOUTH DAILY AT 6 PM   traMADol (ULTRAM) 50 MG tablet Take 1 tablet (50 mg total) by mouth every 6 (six) hours as needed.   traZODone (DESYREL) 100 MG tablet Take 1 tablet (100 mg total) by mouth at bedtime.   No facility-administered encounter medications on file as of 10/19/2022.    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    Flatulence, eructation, and gas pain    Headache(784.0)    Hemorrhoids, external    Herpes zoster with unspecified complication    Hyperpotassemia    Insomnia, unspecified    Lumbago    Lumbago    Myalgia and myositis, unspecified    Nocturia    Obesity, unspecified    Osteoarthrosis, unspecified whether generalized or localized, unspecified site    Other abnormal blood chemistry    Other headache syndromes(339.89)    Pre-diabetes    Pure hyperglyceridemia    Rash and other nonspecific skin eruption    Rotator cuff (capsule) sprain    Routine general medical examination at a health care facility    Tobacco use disorder    Unspecified essential hypertension    Unspecified hypertrophic and atrophic condition of skin    Unspecified sleep apnea    Past Surgical History:  Procedure Laterality Date   CYSTOSCOPY N/A 08/22/2022   Procedure: CYSTOSCOPY;  Surgeon: Belva Agee, MD;  Location: [facility];  Service: Urology;  Laterality: N/A;   DENTAL SURGERY  05/10/2021   Per patient   HEMORRHOID SURGERY     TRANSURETHRAL RESECTION OF PROSTATE N/A 08/22/2022   Procedure: TRANSURETHRAL RESECTION OF THE PROSTATE (TURP);  Surgeon: Belva Agee, MD;  Location: [facility];  Service: Urology;  Laterality: N/A;   tumorectomy Left    Removed tumor from his chest.   VARICOSE VEIN SURGERY     Family History  Problem Relation Age of Onset   Diabetes Mother    Heart disease Mother    Stroke Mother    Stroke Father    Social History   Socioeconomic History   Marital status: Married    Spouse name: Not on file   Number of children: Not on file    Years of education: Not on file   Highest education level: Not on file  Occupational History   Not on file  Tobacco Use   Smoking status: Former    Packs/day: 1.50    Years: 55.00    Additional pack years: 0.00    Total pack years: 82.50    Types: Cigarettes    Quit date: 07/30/2015    Years since quitting: 7.2   Smokeless tobacco: Never   Tobacco comments:    pt thinks increased effexor dose helped him to quit  Vaping Use   Vaping Use: Never used  Substance and Sexual Activity   Alcohol use: No    Alcohol/week: 0.0 standard drinks of alcohol   Drug use: No   Sexual activity: Not Currently  Other Topics Concern   Not on file  Social History Narrative   Married   Stopped smoking 07/30/15   Alcohol none   Exercise none   [employer] 1987    Social Determinants of Health   Financial Resource Strain: Medium Risk (10/19/2022)   Overall Financial Resource Strain (CARDIA)    Difficulty of Paying Living Expenses: Somewhat hard  Food Insecurity: Food Insecurity Present (10/19/2022)   Hunger Vital Sign    Worried About Running Out of Food in the Last Year: Sometimes true    Ran Out of Food in the Last Year: Sometimes true  Transportation Needs: No Transportation Needs (10/19/2022)   PRAPARE - Administrator, Civil Service (Medical): No    Lack of Transportation (Non-Medical): No  Physical Activity: Inactive (10/19/2022)   Exercise Vital Sign    Days of Exercise per Week: 0 days    Minutes of Exercise per Session: 0 min  Stress: No Stress Concern Present (10/19/2022)   [employer] of Occupational Health - Occupational Stress Questionnaire    Feeling of Stress : Only a little  Social Connections: Moderately Isolated (10/19/2022)   Social Connection and Isolation Panel [NHANES]    Frequency of Communication with Friends and Family: Once a week    Frequency of Social Gatherings with Friends and Family: Once a week    Attends Religious Services: More than 4 times  per year    Active Member of [employer] or Organizations: No    Attends Engineer, structural: Never    Marital Status: Married    Tobacco Counseling Counseling given: Not Answered Tobacco comments: pt thinks increased effexor dose helped him to quit   Clinical Intake:  Pre-visit preparation completed: No  Pain : No/denies pain     BMI - recorded: 30.68 Nutritional Status: BMI > 30  Obese Nutritional Risks: None Diabetes: No  How  often do you need to have someone help you when you read instructions, pamphlets, or other written materials from your doctor or pharmacy?: 1 - Never What is the last grade level you completed in school?: High School  Diabetic?Yes  Interpreter Needed?: No      Activities of Daily Living    10/19/2022    9:08 AM 08/22/2022    9:33 AM  In your present state of health, do you have any difficulty performing the following activities:  Hearing? 0 1  Comment  at times  Vision? 0 0  Difficulty concentrating or making decisions? 0 0  Walking or climbing stairs? 1 0  Dressing or bathing? 0 0  Doing errands, shopping? 0   Preparing Food and eating ? N   Using the Toilet? N   In the past six months, have you accidently leaked urine? Y   Comment followed by urology, indwelling foley   Do you have problems with loss of bowel control? N   Managing your Medications? N   Managing your Finances? N   Housekeeping or managing your Housekeeping? N     Patient Care Team: Octavia Heir, NP as PCP - General (Adult Health Nurse Practitioner) Antony Contras, MD as Consulting Physician (Ophthalmology) Octavia Heir, NP as Nurse Practitioner (Adult Health Nurse Practitioner)  Indicate any recent Medical Services you may have received from other than [facility] providers in the past year (date may be approximate).     Assessment:   This is a routine wellness examination for Bradey.  Hearing/Vision screen Hearing Screening - Comments:: No hearing concerns.   Vision Screening - Comments:: No vision concerns. Patient wears prescription glasses. Patient last eye exam 2023.  Dietary issues and exercise activities discussed: Current Exercise Habits: The patient does not participate in regular exercise at present, Exercise limited by: cardiac condition(s) (prostate cancer)   Goals Addressed             This Visit's Progress    lose weight   Not on track    Starting 11/16/16 I will start trying to walk to lose weight.      Maintain Mobility and Function   Not on track    Evidence-based guidance:  Acknowledge and validate impact of pain, loss of strength and potential disfigurement (hand osteoarthritis) on mental health and daily life, such as social isolation, anxiety, depression, impaired sexual relationship and   injury from falls.  Anticipate referral to physical or occupational therapy for assessment, therapeutic exercise and recommendation for adaptive equipment or assistive devices; encourage participation.  Assess impact on ability to perform activities of daily living, as well as engage in sports and leisure events or requirements of work or school.  Provide anticipatory guidance and reassurance about the benefit of exercise to maintain function; acknowledge and normalize fear that exercise may worsen symptoms.  Encourage regular exercise, at least 10 minutes at a time for 45 minutes per week; consider yoga, water exercise and proprioceptive exercises; encourage use of wearable activity tracker to increase motivation and adherence.  Encourage maintenance or resumption of daily activities, including employment, as pain allows and with minimal exposure to trauma.  Assist patient to advocate for adaptations to the work environment.  Consider level of pain and function, gender, age, lifestyle, patient preference, quality of life, readiness and ?ocapacity to benefit? when recommending patients for orthopaedic surgery consultation.  Explore  strategies, such as changes to medication regimen or activity that enables patient to anticipate and manage flare-ups  that increase deconditioning and disability.  Explore patient preferences; encourage exposure to a broader range of activities that have been avoided for fear of experiencing pain.  Identify barriers to participation in therapy or exercise, such as pain with activity, anticipated or imagined pain.  Monitor postoperative joint replacement or any preexisting joint replacement for ongoing pain and loss of function; provide social support and encouragement throughout recovery.   Notes:        Depression Screen    10/19/2022    9:07 AM 10/19/2022    8:46 AM 01/05/2022    9:57 AM 10/13/2021   10:04 AM 04/05/2020   10:52 AM 12/30/2019    9:14 AM 12/01/2019    8:33 AM  PHQ 2/9 Scores  PHQ - 2 Score 0 0 1 0 0 0 0  PHQ- 9 Score   2        Fall Risk    10/19/2022    9:08 AM 10/19/2022    8:46 AM 08/31/2022    8:56 AM 01/05/2022   10:30 AM 01/05/2022    9:50 AM  Fall Risk   Falls in the past year? 0 0 0 1 1  Number falls in past yr: 0 0 0  1  Injury with Fall? 0 0 0 0 0  Risk for fall due to : No Fall Risks No Fall Risks No Fall Risks History of fall(s) History of fall(s)  Follow up Falls evaluation completed;Education provided;Falls prevention discussed Falls evaluation completed Falls evaluation completed Falls evaluation completed;Education provided;Falls prevention discussed Falls evaluation completed    FALL RISK PREVENTION PERTAINING TO THE HOME:  Any stairs in or around the home? Yes  If so, are there any without handrails? No  Home free of loose throw rugs in walkways, pet beds, electrical cords, etc? Yes  Adequate lighting in your home to reduce risk of falls? Yes   ASSISTIVE DEVICES UTILIZED TO PREVENT FALLS:  Life alert? No  Use of a cane, walker or w/c? No  Grab bars in the bathroom? No  Shower chair or bench in shower? No  Elevated toilet seat or a handicapped  toilet? No   TIMED UP AND GO:  Was the test performed? No .  Length of time to ambulate 10 feet: N/A sec.   Gait slow and steady without use of assistive device  Cognitive Function:    10/19/2022    8:46 AM 01/05/2022    9:59 AM 12/31/2020    9:51 AM 12/24/2018    9:36 AM 12/20/2017    8:37 AM  MMSE - Mini Mental State Exam  Orientation to time 5 5 5 5 5   Orientation to Place 5 5 5 5 5   Registration 3 3 3 3 3   Attention/ Calculation 5 5 5 5 5   Recall 2 3 2 3 3   Language- name 2 objects 2 2 2 2 2   Language- repeat 1 1 1 1 1   Language- follow 3 step command 3 3 3 3 3   Language- read & follow direction 1 1 1 1 1   Write a sentence 1 1 1 1 1   Copy design 1 1 1 1 1   Total score 29 30 29 30 30         12/30/2019    9:15 AM  6CIT Screen  What Year? 0 points  What month? 0 points  What time? 0 points  Count back from 20 0 points  Months in reverse 2 points  Repeat phrase 0 points  Total Score 2 points    Immunizations Immunization History  Administered Date(s) Administered   Fluad Quad(high Dose 65+) 03/31/2019, 04/05/2020, 06/09/2021, 04/27/2022   Influenza, High Dose Seasonal PF 04/03/2017, 05/02/2017, 03/28/2018, 05/07/2018, 05/08/2019, 05/10/2020   Influenza,inj,Quad PF,6+ Mos 06/13/2013, 04/10/2014, 05/06/2015, 05/12/2016   Influenza-Unspecified 04/09/2010, 05/09/2012   Moderna Covid-19 Vaccine Bivalent Booster 32yrs & up 05/06/2021   PFIZER(Purple Top)SARS-COV-2 Vaccination 09/07/2019, 10/07/2019, 05/06/2020   Pneumococcal Conjugate-13 04/16/2014   Pneumococcal Polysaccharide-23 01/26/2011, 11/25/2015, 05/02/2017, 05/07/2018, 05/08/2019, 05/10/2020, 05/10/2021   Td 07/10/2000, 06/13/2013   Zoster Recombinat (Shingrix) 11/20/2016, 03/28/2017   Zoster, Live 08/05/2012    TDAP status: Up to date  Flu Vaccine status: Up to date  Pneumococcal vaccine status: Up to date  Covid-19 vaccine status: Completed vaccines  Qualifies for Shingles Vaccine? Yes   Zostavax  completed No   Shingrix Completed?: Yes  Screening Tests Health Maintenance  Topic Date Due   Lung Cancer Screening  Never done   INFLUENZA VACCINE  02/08/2023   DTaP/Tdap/Td (3 - Tdap) 06/14/2023   Medicare Annual Wellness (AWV)  10/19/2023   Pneumonia Vaccine 64+ Years old  Completed   Hepatitis C Screening  Completed   Zoster Vaccines- Shingrix  Completed   HPV VACCINES  Aged Out   COLONOSCOPY (Pts 45-77yrs Insurance coverage will need to be confirmed)  Discontinued   COVID-19 Vaccine  Discontinued    Health Maintenance  Health Maintenance Due  Topic Date Due   Lung Cancer Screening  Never done    Colorectal cancer screening: No longer required.   Lung Cancer Screening: (Low Dose CT Chest recommended if Age 61-80 years, 30 pack-year currently smoking OR have quit w/in 15years.) does qualify.   Lung Cancer Screening Referral: Refused  Additional Screening:  Hepatitis C Screening: does not qualify; Completed   Vision Screening: Recommended annual ophthalmology exams for early detection of glaucoma and other disorders of the eye. Is the patient up to date with their annual eye exam?  Yes  Who is the provider or what is the name of the office in which the patient attends annual eye exams? Dr. Randon Goldsmith If pt is not established with a provider, would they like to be referred to a provider to establish care? No .   Dental Screening: Recommended annual dental exams for proper oral hygiene  Community Resource Referral / Chronic Care Management: CRR required this visit?  No   CCM required this visit?  No      Plan:     I have personally reviewed and noted the following in the patient's chart:   Medical and social history Use of alcohol, tobacco or illicit drugs  Current medications and supplements including opioid prescriptions. Patient is not currently taking opioid prescriptions. Functional ability and status Nutritional status Physical activity Advanced  directives List of other physicians Hospitalizations, surgeries, and ER visits in previous 12 months Vitals Screenings to include cognitive, depression, and falls Referrals and appointments  In addition, I have reviewed and discussed with patient certain preventive protocols, quality metrics, and best practice recommendations. A written personalized care plan for preventive services as well as general preventive health recommendations were provided to patient.     Octavia Heir, NP   10/19/2022   Nurse Notes: Will need Tdap vaccine 06/2023

## 2022-10-19 NOTE — Patient Instructions (Signed)
  David [name] , Thank you for taking time to come for your Medicare Wellness Visit. I appreciate your ongoing commitment to your health goals. Please review the following plan we discussed and let me know if I can assist you in the future.   These are the goals we discussed:  Goals      lose weight     Starting 11/16/16 I will start trying to walk to lose weight.      Maintain Mobility and Function     Evidence-based guidance:  Acknowledge and validate impact of pain, loss of strength and potential disfigurement (hand osteoarthritis) on mental health and daily life, such as social isolation, anxiety, depression, impaired sexual relationship and   injury from falls.  Anticipate referral to physical or occupational therapy for assessment, therapeutic exercise and recommendation for adaptive equipment or assistive devices; encourage participation.  Assess impact on ability to perform activities of daily living, as well as engage in sports and leisure events or requirements of work or school.  Provide anticipatory guidance and reassurance about the benefit of exercise to maintain function; acknowledge and normalize fear that exercise may worsen symptoms.  Encourage regular exercise, at least 10 minutes at a time for 45 minutes per week; consider yoga, water exercise and proprioceptive exercises; encourage use of wearable activity tracker to increase motivation and adherence.  Encourage maintenance or resumption of daily activities, including employment, as pain allows and with minimal exposure to trauma.  Assist patient to advocate for adaptations to the work environment.  Consider level of pain and function, gender, age, lifestyle, patient preference, quality of life, readiness and ?ocapacity to benefit? when recommending patients for orthopaedic surgery consultation.  Explore strategies, such as changes to medication regimen or activity that enables patient to anticipate and manage flare-ups  that increase deconditioning and disability.  Explore patient preferences; encourage exposure to a broader range of activities that have been avoided for fear of experiencing pain.  Identify barriers to participation in therapy or exercise, such as pain with activity, anticipated or imagined pain.  Monitor postoperative joint replacement or any preexisting joint replacement for ongoing pain and loss of function; provide social support and encouragement throughout recovery.   Notes:      Quit smoking / using tobacco     Has quit smoking since 2017         This is a list of the screening recommended for you and due dates:  Health Maintenance  Topic Date Due   Screening for Lung Cancer  Never done   Flu Shot  02/08/2023   DTaP/Tdap/Td vaccine (3 - Tdap) 06/14/2023   Medicare Annual Wellness Visit  10/19/2023   Pneumonia Vaccine  Completed   Hepatitis C Screening: USPSTF Recommendation to screen - Ages 18-79 yo.  Completed   Zoster (Shingles) Vaccine  Completed   HPV Vaccine  Aged Out   Colon Cancer Screening  Discontinued   COVID-19 Vaccine  Discontinued

## 2022-10-22 ENCOUNTER — Other Ambulatory Visit: Payer: Self-pay | Admitting: Orthopedic Surgery

## 2022-10-22 DIAGNOSIS — G4709 Other insomnia: Secondary | ICD-10-CM

## 2022-10-23 NOTE — Telephone Encounter (Signed)
High Risk Warning Populated when attempting to refill, I will send to Provider for further review 

## 2022-10-24 ENCOUNTER — Other Ambulatory Visit (HOSPITAL_COMMUNITY): Payer: Medicare PPO

## 2022-10-30 ENCOUNTER — Encounter (HOSPITAL_COMMUNITY)
Admission: RE | Admit: 2022-10-30 | Discharge: 2022-10-30 | Disposition: A | Discharge status: Home / Self Care | Payer: Medicare PPO | Source: Ambulatory Visit | Attending: Adult Health | Admitting: Adult Health

## 2022-10-30 DIAGNOSIS — C61 Malignant neoplasm of prostate: Secondary | ICD-10-CM | POA: Diagnosis not present

## 2022-10-30 MED ORDER — PIFLIFOLASTAT F 18 (PYLARIFY) INJECTION
9.0000 | Freq: Once | INTRAVENOUS | Status: AC
  Administered 2022-10-30: 9 via INTRAVENOUS

## 2022-10-31 ENCOUNTER — Telehealth: Payer: Self-pay | Admitting: Pharmacist

## 2022-10-31 NOTE — Progress Notes (Signed)
Patient appearing on report for quality metrics: controlling blood pressure (CBP).  Outreached patient to discuss medication management. Left voicemail for patient to return my call at their convenience.   Lynnda Shields, PharmD, BCPS Clinical Pharmacist [facility]

## 2022-11-03 DIAGNOSIS — R338 Other retention of urine: Secondary | ICD-10-CM | POA: Diagnosis not present

## 2022-12-08 ENCOUNTER — Other Ambulatory Visit: Payer: Self-pay

## 2022-12-08 DIAGNOSIS — E781 Pure hyperglyceridemia: Secondary | ICD-10-CM

## 2022-12-08 DIAGNOSIS — R7303 Prediabetes: Secondary | ICD-10-CM

## 2022-12-08 DIAGNOSIS — I10 Essential (primary) hypertension: Secondary | ICD-10-CM

## 2022-12-08 DIAGNOSIS — R338 Other retention of urine: Secondary | ICD-10-CM | POA: Diagnosis not present

## 2022-12-08 DIAGNOSIS — C61 Malignant neoplasm of prostate: Secondary | ICD-10-CM | POA: Diagnosis not present

## 2022-12-08 DIAGNOSIS — N401 Enlarged prostate with lower urinary tract symptoms: Secondary | ICD-10-CM | POA: Diagnosis not present

## 2022-12-25 ENCOUNTER — Other Ambulatory Visit: Payer: Medicare PPO

## 2022-12-25 DIAGNOSIS — Z08 Encounter for follow-up examination after completed treatment for malignant neoplasm: Secondary | ICD-10-CM | POA: Diagnosis not present

## 2022-12-25 DIAGNOSIS — R7303 Prediabetes: Secondary | ICD-10-CM | POA: Diagnosis not present

## 2022-12-25 DIAGNOSIS — L700 Acne vulgaris: Secondary | ICD-10-CM | POA: Diagnosis not present

## 2022-12-25 DIAGNOSIS — Z86007 Personal history of in-situ neoplasm of skin: Secondary | ICD-10-CM | POA: Diagnosis not present

## 2022-12-25 DIAGNOSIS — L218 Other seborrheic dermatitis: Secondary | ICD-10-CM | POA: Diagnosis not present

## 2022-12-25 DIAGNOSIS — Z85820 Personal history of malignant melanoma of skin: Secondary | ICD-10-CM | POA: Diagnosis not present

## 2022-12-25 DIAGNOSIS — E781 Pure hyperglyceridemia: Secondary | ICD-10-CM | POA: Diagnosis not present

## 2022-12-25 DIAGNOSIS — I10 Essential (primary) hypertension: Secondary | ICD-10-CM | POA: Diagnosis not present

## 2022-12-25 LAB — CBC WITH DIFFERENTIAL/PLATELET
Eosinophils Absolute: 113 cells/uL (ref 15–500)
Eosinophils Relative: 2.4 %
HCT: 41.8 % (ref 38.5–50.0)
MCHC: 32.5 g/dL (ref 32.0–36.0)
Neutrophils Relative %: 58.2 %

## 2022-12-26 LAB — CBC WITH DIFFERENTIAL/PLATELET
Absolute Monocytes: 414 cells/uL (ref 200–950)
Basophils Absolute: 42 cells/uL (ref 0–200)
Basophils Relative: 0.9 %
Hemoglobin: 13.6 g/dL (ref 13.2–17.1)
Lymphs Abs: 1396 cells/uL (ref 850–3900)
MCH: 30.4 pg (ref 27.0–33.0)
MCV: 93.3 fL (ref 80.0–100.0)
MPV: 11.3 fL (ref 7.5–12.5)
Monocytes Relative: 8.8 %
Neutro Abs: 2735 cells/uL (ref 1500–7800)
Platelets: 276 10*3/uL (ref 140–400)
RBC: 4.48 10*6/uL (ref 4.20–5.80)
RDW: 13 % (ref 11.0–15.0)
Total Lymphocyte: 29.7 %
WBC: 4.7 10*3/uL (ref 3.8–10.8)

## 2022-12-26 LAB — COMPLETE METABOLIC PANEL WITH GFR
AG Ratio: 1 (calc) (ref 1.0–2.5)
ALT: 12 U/L (ref 9–46)
AST: 15 U/L (ref 10–35)
Albumin: 3.8 g/dL (ref 3.6–5.1)
Alkaline phosphatase (APISO): 83 U/L (ref 35–144)
BUN: 14 mg/dL (ref 7–25)
CO2: 28 mmol/L (ref 20–32)
Calcium: 9.3 mg/dL (ref 8.6–10.3)
Chloride: 102 mmol/L (ref 98–110)
Creat: 0.71 mg/dL (ref 0.70–1.28)
Globulin: 3.8 g/dL (calc) — ABNORMAL HIGH (ref 1.9–3.7)
Glucose, Bld: 120 mg/dL — ABNORMAL HIGH (ref 65–99)
Potassium: 4.2 mmol/L (ref 3.5–5.3)
Sodium: 140 mmol/L (ref 135–146)
Total Bilirubin: 0.4 mg/dL (ref 0.2–1.2)
Total Protein: 7.6 g/dL (ref 6.1–8.1)
eGFR: 94 mL/min/{1.73_m2} (ref >=60)

## 2022-12-26 LAB — LIPID PANEL
Cholesterol: 106 mg/dL (ref <200)
HDL: 33 mg/dL — ABNORMAL LOW (ref >=40)
LDL Cholesterol (Calc): 52 mg/dL (calc)
Non-HDL Cholesterol (Calc): 73 mg/dL (calc) (ref <130)
Total CHOL/HDL Ratio: 3.2 (calc) (ref <5.0)
Triglycerides: 129 mg/dL (ref <150)

## 2022-12-26 LAB — HEMOGLOBIN A1C
Hgb A1c MFr Bld: 6.2 % of total Hgb — ABNORMAL HIGH (ref <5.7)
Mean Plasma Glucose: 131 mg/dL
eAG (mmol/L): 7.3 mmol/L

## 2022-12-28 ENCOUNTER — Encounter: Payer: Self-pay | Admitting: Orthopedic Surgery

## 2022-12-28 ENCOUNTER — Ambulatory Visit (INDEPENDENT_AMBULATORY_CARE_PROVIDER_SITE_OTHER): Payer: Medicare PPO | Admitting: Orthopedic Surgery

## 2022-12-28 VITALS — BP 122/78 | HR 75 | Temp 98.0°F | Resp 16 | Ht 70.0 in | Wt 208.2 lb

## 2022-12-28 DIAGNOSIS — R339 Retention of urine, unspecified: Secondary | ICD-10-CM | POA: Diagnosis not present

## 2022-12-28 DIAGNOSIS — R3914 Feeling of incomplete bladder emptying: Secondary | ICD-10-CM

## 2022-12-28 DIAGNOSIS — G4709 Other insomnia: Secondary | ICD-10-CM | POA: Diagnosis not present

## 2022-12-28 DIAGNOSIS — E781 Pure hyperglyceridemia: Secondary | ICD-10-CM | POA: Diagnosis not present

## 2022-12-28 DIAGNOSIS — N401 Enlarged prostate with lower urinary tract symptoms: Secondary | ICD-10-CM

## 2022-12-28 DIAGNOSIS — Z9109 Other allergy status, other than to drugs and biological substances: Secondary | ICD-10-CM | POA: Diagnosis not present

## 2022-12-28 DIAGNOSIS — I10 Essential (primary) hypertension: Secondary | ICD-10-CM | POA: Diagnosis not present

## 2022-12-28 DIAGNOSIS — R7303 Prediabetes: Secondary | ICD-10-CM | POA: Diagnosis not present

## 2022-12-28 DIAGNOSIS — C61 Malignant neoplasm of prostate: Secondary | ICD-10-CM | POA: Diagnosis not present

## 2022-12-28 MED ORDER — TRAZODONE HCL 100 MG PO TABS: ORAL_TABLET | ORAL | 1 refills | Status: DC

## 2022-12-28 MED ORDER — CETIRIZINE HCL 10 MG PO TABS
10.0000 mg | ORAL_TABLET | Freq: Every day | ORAL | 11 refills | Status: AC
Start: 2022-12-28 — End: ?

## 2022-12-28 NOTE — Progress Notes (Signed)
Careteam: Patient Care Team: Octavia Heir, NP as PCP - General (Adult Health Nurse Practitioner) Antony Contras, MD as Consulting Physician (Ophthalmology) Octavia Heir, NP as Nurse Practitioner (Adult Health Nurse Practitioner)  Seen by: Hazle Nordmann, AGNP-C  PLACE OF SERVICE:  [facility]  Advanced Directive information Does Patient Have a Medical Advance Directive?: No, Would patient like information on creating a medical advance directive?: No - Patient declined  Allergies  Allergen Reactions   Oxycontin [Oxycodone Hcl] Hives and Shortness Of Breath   Penicillins Hives and Shortness Of Breath   Celebrex [Celecoxib]    Erythromycin    Tetracyclines & Related     Chief Complaint  Patient presents with   Medical Management of Chronic Issues    4 month follow up.    Health Maintenance    Discuss the need for lung cancer screening.      HPI: Patient is a 77 y.o. male seen today for medical management of chronic conditions.   Labs reviewed with patient.   Followed by [facility] due to Prostate Cancer. Receiving chemo injection every 6 months. Last injection 11/2022. Admits to feeling fatigue.   He has a indwelling foley due to urinary retention. He remains on finasteride and silodosin.   A1c 6.2> was 6.0. Diet controlled. Admits to eating sweets when eating at restaurant.   Total cholesterol < 200, does not want to reduce dose. Admits to eating buffet foods 1-2 times weekly.   Requesting new allergy medication.     Review of Systems:  Review of Systems  Constitutional:  Negative for fever.  HENT:  Positive for congestion. Negative for sore throat.   Eyes:  Negative for blurred vision.  Respiratory:  Negative for cough, shortness of breath and wheezing.   Cardiovascular:  Negative for chest pain and leg swelling.  Gastrointestinal:  Negative for constipation and heartburn.  Genitourinary:  Positive for frequency, hematuria and urgency. Negative for dysuria.   Musculoskeletal:  Negative for falls and joint pain.  Skin:  Negative for rash.  Neurological:  Positive for weakness. Negative for dizziness and headaches.  Psychiatric/Behavioral:  Positive for depression. Negative for memory loss and suicidal ideas. The patient has insomnia. The patient is not nervous/anxious.     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    Flatulence, eructation, and gas pain    Headache(784.0)    Hemorrhoids, external    Herpes zoster with unspecified complication    Hyperpotassemia    Insomnia, unspecified    Lumbago    Lumbago    Myalgia and myositis, unspecified    Nocturia    Obesity, unspecified    Osteoarthrosis, unspecified whether generalized or localized, unspecified site    Other abnormal blood chemistry    Other headache syndromes(339.89)    Pre-diabetes    Pure hyperglyceridemia    Rash and other nonspecific skin eruption    Rotator cuff (capsule) sprain    Routine general medical examination at a health care facility    Tobacco use disorder    Unspecified essential hypertension    Unspecified hypertrophic and atrophic condition of skin  Unspecified sleep apnea    Past Surgical History:  Procedure Laterality Date   CYSTOSCOPY N/A 08/22/2022   Procedure: CYSTOSCOPY;  Surgeon: Belva Agee, MD;  Location: [facility];  Service: Urology;  Laterality: N/A;   DENTAL SURGERY  05/10/2021   Per patient   HEMORRHOID SURGERY     TRANSURETHRAL RESECTION OF PROSTATE N/A 08/22/2022   Procedure: TRANSURETHRAL RESECTION OF THE PROSTATE (TURP);  Surgeon: Belva Agee, MD;  Location: [facility];  Service: Urology;  Laterality: N/A;   tumorectomy Left    Removed tumor from his chest.   VARICOSE VEIN SURGERY     Social History:   reports that he quit smoking about 7 years ago. His smoking use included cigarettes. He has a 82.50 pack-year smoking history. He has never used smokeless tobacco. He reports that he does not drink alcohol and does not use drugs.  Family History  Problem Relation Age of Onset   Diabetes Mother    Heart disease Mother    Stroke Mother    Stroke Father     Medications: Patient's Medications  New Prescriptions   No medications on file  Previous Medications   FINASTERIDE (PROSCAR) 5 MG TABLET    TAKE 1 TABLET(5 MG) BY MOUTH DAILY   LORATADINE (CLARITIN) 10 MG TABLET    TAKE 1 TABLET BY MOUTH DAILY AS NEEDED FOR ALLERGIES   LOSARTAN (COZAAR) 50 MG TABLET    Take one tablet by mouth daily   SILODOSIN (RAPAFLO) 8 MG CAPS CAPSULE    Take 8 mg by mouth daily with breakfast.   SIMVASTATIN (ZOCOR) 20 MG TABLET    TAKE 1 TABLET(20 MG) BY MOUTH DAILY AT 6 PM   TRAZODONE (DESYREL) 100 MG TABLET    TAKE 1 TABLET(100 MG) BY MOUTH AT BEDTIME  Modified Medications   No medications on file  Discontinued Medications   TRAMADOL (ULTRAM) 50 MG TABLET    Take 1 tablet (50 mg total) by mouth every 6 (six) hours as needed.    Physical Exam:  Vitals:   12/28/22 0816  BP: (!) 150/70  Pulse: 75  Resp: 16  Temp: 98 F (36.7 C)  SpO2: 93%  Weight: 208 lb 3.2 oz (94.4 kg)  Height: 5\' 10"  (1.778 m)   Body mass index is 29.87 kg/m. Wt Readings from Last 3 Encounters:  12/28/22 208 lb 3.2 oz (94.4 kg)  10/19/22 213 lb 12.8 oz (97 kg)  08/31/22 214 lb 6.4 oz (97.3 kg)    Physical Exam Vitals reviewed.  Constitutional:      General: He is not in acute distress. HENT:     Head: Normocephalic.     Nose: Rhinorrhea present.     Mouth/Throat:     Mouth: Mucous membranes are moist.  Eyes:     General:        Right eye: No discharge.        Left eye:  No discharge.  Cardiovascular:     Rate and Rhythm: Normal rate and regular rhythm.     Pulses: Normal pulses.     Heart sounds: Normal heart sounds.  Pulmonary:     Effort: Pulmonary effort is normal. No respiratory distress.     Breath sounds: Normal breath sounds. No wheezing.  Abdominal:     General: Bowel sounds are normal. There is no distension.     Palpations: Abdomen is soft.     Tenderness: There is no abdominal tenderness.  Musculoskeletal:     Cervical back: Neck supple.     Right lower leg: No edema.     Left lower leg: No edema.  Skin:    General: Skin is warm.     Capillary Refill: Capillary refill takes less than 2 seconds.  Neurological:     General: No focal deficit present.     Mental Status: He is alert and oriented to person, place, and time.  Psychiatric:        Mood and Affect: Mood normal.        Behavior: Behavior normal.     Labs reviewed: Basic Metabolic Panel: Recent Labs    04/17/22 0816 08/29/22 0000 12/25/22 0825  NA 138 140 140  K 4.9 4.8 4.2  CL 104 103 102  CO2 28 29 28   GLUCOSE 116* 111* 120*  BUN 19 17 14   CREATININE 0.81 0.75 0.71  CALCIUM 9.5 9.7 9.3   Liver Function Tests: Recent Labs    04/17/22 0816 08/29/22 0000 12/25/22 0825  AST 14 14 15   ALT 10 12 12   BILITOT 0.5 0.5 0.4  PROT 7.2 7.6 7.6   No results for input(s): "LIPASE", "AMYLASE" in the last 8760 hours. No results for input(s): "AMMONIA" in the last 8760 hours. CBC: Recent Labs    04/17/22 0816 08/29/22 0000 12/25/22 0825  WBC 5.5 5.8 4.7  NEUTROABS 3,108 3,822 2,735  HGB 14.5 15.9 13.6  HCT 42.7 48.1 41.8  MCV 93.2 94.5 93.3  PLT 185 207 276   Lipid Panel: Recent Labs    04/17/22 0816 08/29/22 0000 12/25/22 0825  CHOL 151 188 106  HDL 41 35* 33*  LDLCALC 78 112* 52  TRIG 234* 287* 129  CHOLHDL 3.7 5.4* 3.2   TSH: No results for input(s): "TSH" in the last 8760 hours. A1C: Lab Results  Component Value Date   HGBA1C 6.2 (H)  12/25/2022     Assessment/Plan 1. Prostate cancer [facility]) - followed by [facility], Dr. Benancio Deeds - TURP 2/13 - started chemo injection 11/2022> awaiting record  2. Prediabetes - A1c 6.2 - diet controlled  - cont diet low in carbs and sugars  3. Urinary retention - has foley  4. Essential hypertension - controlled - cont losartan  5. Benign prostatic hyperplasia with incomplete bladder emptying - cont finasteride and silodosin    6. Hypertriglyceridemia - Triglycerides 129> was 287  7. Other insomnia - traZODone (DESYREL) 100 MG tablet; TAKE 1 TABLET(100 MG) BY MOUTH AT BEDTIME  Dispense: 90 tablet; Refill: 1  8. Environmental allergies - has been on Claritin x 1 year - will switch to Zyrtec  - cetirizine (ZYRTEC) 10 MG tablet; Take 1 tablet (10 mg total) by mouth daily.  Dispense: 30 tablet; Refill: 11  Total time: 32 minutes. Greater than 50% of total time spent doing patient education regarding health maintenance, prediabetes, HTN, HLD, prostate cancer and allergies.     Next appt: 05/03/2023  Hazle Nordmann, Juel Burrow  [facility] [phone]

## 2023-01-03 DIAGNOSIS — R338 Other retention of urine: Secondary | ICD-10-CM | POA: Diagnosis not present

## 2023-01-04 DIAGNOSIS — R338 Other retention of urine: Secondary | ICD-10-CM | POA: Diagnosis not present

## 2023-02-01 DIAGNOSIS — R3914 Feeling of incomplete bladder emptying: Secondary | ICD-10-CM | POA: Diagnosis not present

## 2023-02-16 DIAGNOSIS — C61 Malignant neoplasm of prostate: Secondary | ICD-10-CM | POA: Diagnosis not present

## 2023-02-16 DIAGNOSIS — R338 Other retention of urine: Secondary | ICD-10-CM | POA: Diagnosis not present

## 2023-02-16 DIAGNOSIS — C7951 Secondary malignant neoplasm of bone: Secondary | ICD-10-CM | POA: Diagnosis not present

## 2023-03-02 DIAGNOSIS — C61 Malignant neoplasm of prostate: Secondary | ICD-10-CM | POA: Diagnosis not present

## 2023-03-02 DIAGNOSIS — R338 Other retention of urine: Secondary | ICD-10-CM | POA: Diagnosis not present

## 2023-03-21 DIAGNOSIS — R338 Other retention of urine: Secondary | ICD-10-CM | POA: Diagnosis not present

## 2023-03-21 DIAGNOSIS — C778 Secondary and unspecified malignant neoplasm of lymph nodes of multiple regions: Secondary | ICD-10-CM | POA: Diagnosis not present

## 2023-03-21 DIAGNOSIS — I7143 Infrarenal abdominal aortic aneurysm, without rupture: Secondary | ICD-10-CM | POA: Diagnosis not present

## 2023-03-21 DIAGNOSIS — C61 Malignant neoplasm of prostate: Secondary | ICD-10-CM | POA: Diagnosis not present

## 2023-03-21 DIAGNOSIS — C7951 Secondary malignant neoplasm of bone: Secondary | ICD-10-CM | POA: Diagnosis not present

## 2023-03-23 ENCOUNTER — Other Ambulatory Visit: Payer: Medicare PPO

## 2023-03-23 ENCOUNTER — Ambulatory Visit: Payer: Medicare PPO

## 2023-03-26 NOTE — Progress Notes (Addendum)
[facility] CONSULT NOTE  Addendum Dexa scan reviewed. The BMD measured at Right Femur Neck is 0.791 g/cm2 with a T-score of -1.8.   Patient Care Team: Octavia Heir, NP as PCP - General (Adult Health Nurse Practitioner) Antony Contras, MD as Consulting Physician (Ophthalmology) Octavia Heir, NP as Nurse Practitioner (Adult Health Nurse Practitioner) Cherlyn Cushing, RN as Oncology Nurse Navigator  ASSESSMENT & PLAN:  David [name] is a 77 y.o.male with history of HTN, HLD being seen at [facility] for consideration of chemotherapy for prostate cancer.  Initial diagnosis: 08/2022. mHSPC with lymph node, bone, lung metastases. Current Treatment: ADT, darolutamide.  Prostate cancer metastatic to bone [facility]) We discussed his prostate cancer is stage 4. It is not curable but treatable. Normally we will give chemotherapy called docetaxel on top of injection you had and another pill to help fight this cancer. Study showed this combination can improve survival.  However, given his abdominal aortic aneurysm, the concern is worsening aneurysm.  Aortic rupture has been reported.  This is life-threatening and can cause death.  I recommend follow-up with an ultrasound and then see a vascular surgeon for evaluation.  Records show that he may have been prescribed of darolutamide.  He is not sure of this. Marisue Ivan our NN will help figure this out for the patient.  He continues to need indwelling urinary catheter.  He feels very uncomfortable and very restricted.  I will discuss his case at tumor board if radiation could be helpful.  Continue ADT Once obtained darolutamide which Urology had ordered, may start. He is on low-dose simvastatin, will monitor for any toxicity. Will request TB evaluation Genetic testing for metastatic prostate cancer Return to see me with PSA, testosterone, CMP and CBC in 4 weeks.  At risk for side effect of medication  Supportive baseline bone mineral density study  and then every 2 years calcium (1000-1200 mg daily from food and supplements) and vitamin D3 (1000 IU daily) Control and prevent diabetes Weight-bearing exercises (30 minutes per day) Limit alcohol consumption and avoid smoking Monitoring control blood pressure  Abdominal aortic aneurysm (AAA) (HCC) Obtain US Refer to vascular surgery   Orders Placed This Encounter  Procedures   DG BONE DENSITY (DXA)    Standing Status:   Future    Standing Expiration Date:   03/28/2024    Order Specific Question:   Reason for Exam (SYMPTOM  OR DIAGNOSIS REQUIRED)    Answer:   assess bone density baseline with prostate cancer    Order Specific Question:   Preferred imaging location?    Answer:   [facility]   US Abdomen Complete    Standing Status:   Future    Standing Expiration Date:   03/28/2024    Order Specific Question:   Reason for Exam (SYMPTOM  OR DIAGNOSIS REQUIRED)    Answer:   AAA evaluation for repair. needs chemo in future    Order Specific Question:   Preferred imaging location?    Answer:   [facility]   CMP (Cancer Center only)    Standing Status:   Future    Standing Expiration Date:   03/28/2024   CBC with Differential (Cancer Center Only)    Standing Status:   Future    Standing Expiration Date:   03/28/2024   PSA, total and free    Standing Status:   Future    Standing Expiration Date:   03/28/2024   Testosterone  Standing Status:   Future    Standing Expiration Date:   03/28/2024   Ambulatory referral to Vascular Surgery    Referral Priority:   Routine    Referral Type:   Surgical    Referral Reason:   Specialty Services Required    Requested Specialty:   Vascular Surgery    Number of Visits Requested:   1   All questions were answered. The patient knows to call the clinic with any problems, questions or concerns. No barriers to learning was detected.  Melven Sartorius, MD 9/19/202412:19 PM  CHIEF COMPLAINTS/PURPOSE OF CONSULTATION:  Prostate  cancer  HISTORY OF PRESENTING ILLNESS:  David [name]. 77 y.o. male is here because of prostate cancer.  I have reviewed his chart and materials related to his cancer extensively and collaborated history with the patient. Summary of oncologic history is as below:  Oncology History  Prostate cancer metastatic to bone [facility])  08/2022 Surgery   TURP for BPH: Prostatic adenocarcinoma, Gleason score 4+5=9 (grade group5) involves 80% of the resected tissue.  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    08/2022 Initial Diagnosis   Prostate cancer metastatic to bone (HCC)   09/28/2022 Tumor Marker   PSA 33.5   10/30/2022 Imaging   PSMA PET IMPRESSION: 1. There is diffuse radiotracer uptake throughout the prostate gland compatible with residual/recurrent prostate cancer. 2. Bilateral tracer avid pelvic lymph nodes compatible with nodal metastasis. 3. Multifocal tracer avid bone metastases. 4. Solitary tracer avid pulmonary nodule within the right upper lobe is identified. 5. 5.2 cm infrarenal abdominal aortic aneurysm. Recommend follow-up every 6 months and vascular consultation. This recommendation follows ACR consensus guidelines: White Paper of the ACR Incidental Findings Committee II on Vascular Findings. J Am Coll Radiol 2013; 10:789-794. Aortic aneurysm NOS (ICD10-I71.9). 6.  Aortic Atherosclerosis (ICD10-I70.0).   11/2022 -  Chemotherapy   started Orgovynx and converted to Eligard.   03/02/2023 Tumor Marker   PSA 14.8 testosterone 19.7. alk phos 77    David [name] was diagnosed of prostate cancer earlier this year.  He endorsed that his memory is not great.  Overall he is feeling well.  He has a chronic indwelling urinary catheter.  He feels this does cause discomfort at times.  He denies any bone pain, back pain, hip pain or other  systemic symptoms.  He has a 50 pack smoking history and quit about 7 years ago. Otherwise he is able to take care of himself and fairly independent.  Rest of the review of system was negative.   MEDICAL HISTORY:  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    Flatulence, eructation, and gas pain    Headache(784.0)    Hemorrhoids, external    Herpes zoster with unspecified complication    Hyperpotassemia    Insomnia, unspecified    Lumbago    Lumbago    Myalgia and myositis, unspecified    Nocturia    Obesity, unspecified    Osteoarthrosis, unspecified whether generalized or localized, unspecified  site    Other abnormal blood chemistry    Other headache syndromes(339.89)    Pre-diabetes    Pure hyperglyceridemia    Rash and other nonspecific skin eruption    Rotator cuff (capsule) sprain    Routine general medical examination at a health care facility    Tobacco use disorder    Unspecified essential hypertension    Unspecified hypertrophic and atrophic condition of skin    Unspecified sleep apnea     SURGICAL HISTORY: Past Surgical History:  Procedure Laterality Date   CYSTOSCOPY N/A 08/22/2022   Procedure: CYSTOSCOPY;  Surgeon: Belva Agee, MD;  Location: [facility];  Service: Urology;  Laterality: N/A;   DENTAL SURGERY  05/10/2021   Per patient   HEMORRHOID SURGERY     TRANSURETHRAL RESECTION OF PROSTATE N/A 08/22/2022   Procedure: TRANSURETHRAL RESECTION OF THE PROSTATE (TURP);  Surgeon: Belva Agee, MD;  Location: [facility];  Service: Urology;  Laterality: N/A;   tumorectomy Left    Removed tumor  from his chest.   VARICOSE VEIN SURGERY      SOCIAL HISTORY: Social History   Socioeconomic History   Marital status: Married    Spouse name: Not on file   Number of children: Not on file   Years of education: Not on file   Highest education level: Not on file  Occupational History   Not on file  Tobacco Use   Smoking status: Former    Current packs/day: 0.00    Average packs/day: 1.5 packs/day for 55.0 years (82.5 ttl pk-yrs)    Types: Cigarettes    Start date: 07/29/1960    Quit date: 07/30/2015    Years since quitting: 7.6   Smokeless tobacco: Never   Tobacco comments:    pt thinks increased effexor dose helped him to quit  Vaping Use   Vaping status: Never Used  Substance and Sexual Activity   Alcohol use: No    Alcohol/week: 0.0 standard drinks of alcohol   Drug use: No   Sexual activity: Not Currently  Other Topics Concern   Not on file  Social History Narrative   Married   Stopped smoking 07/30/15   Alcohol none   Exercise none   [employer] 1987    Social Determinants of Health   Financial Resource Strain: Medium Risk (10/19/2022)   Overall Financial Resource Strain (CARDIA)    Difficulty of Paying Living Expenses: Somewhat hard  Food Insecurity: Food Insecurity Present (10/19/2022)   Hunger Vital Sign    Worried About Running Out of Food in the Last Year: Sometimes true    Ran Out of Food in the Last Year: Sometimes true  Transportation Needs: No Transportation Needs (10/19/2022)   PRAPARE - Administrator, Civil Service (Medical): No    Lack of Transportation (Non-Medical): No  Physical Activity: Inactive (10/19/2022)   Exercise Vital Sign    Days of Exercise per Week: 0 days    Minutes of Exercise per Session: 0 min  Stress: No Stress Concern Present (10/19/2022)   [employer] of Occupational Health - Occupational Stress Questionnaire    Feeling of Stress : Only a little  Social Connections: Moderately Isolated (10/19/2022)   Social  Connection and Isolation Panel [NHANES]    Frequency of Communication with Friends and Family: Once a week    Frequency of Social Gatherings with Friends and Family: Once a week    Attends Religious Services: More than  4 times per year    Active Member of Clubs or Organizations: No    Attends Banker Meetings: Never    Marital Status: Married  Catering manager Violence: Not At Risk (10/19/2022)   Humiliation, Afraid, Rape, and Kick questionnaire    Fear of Current or Ex-Partner: No    Emotionally Abused: No    Physically Abused: No    Sexually Abused: No    FAMILY HISTORY: Family History  Problem Relation Age of Onset   Diabetes Mother    Heart disease Mother    Stroke Mother    Stroke Father     ALLERGIES:  is allergic to oxycontin [oxycodone hcl], penicillins, celebrex [celecoxib], erythromycin, and tetracyclines & related.  MEDICATIONS:  Current Outpatient Medications  Medication Sig Dispense Refill   acetaminophen (TYLENOL) 325 MG tablet Take 650 mg by mouth every 6 (six) hours as needed (pain and sleep aid).     cetirizine (ZYRTEC) 10 MG tablet Take 1 tablet (10 mg total) by mouth daily. 30 tablet 11   finasteride (PROSCAR) 5 MG tablet TAKE 1 TABLET(5 MG) BY MOUTH DAILY 90 tablet 3   losartan (COZAAR) 50 MG tablet Take one tablet by mouth daily 90 tablet 3   silodosin (RAPAFLO) 8 MG CAPS capsule Take 8 mg by mouth daily with breakfast.     simvastatin (ZOCOR) 20 MG tablet TAKE 1 TABLET(20 MG) BY MOUTH DAILY AT 6 PM 90 tablet 1   traZODone (DESYREL) 100 MG tablet TAKE 1 TABLET(100 MG) BY MOUTH AT BEDTIME 90 tablet 1   No current facility-administered medications for this visit.    REVIEW OF SYSTEMS:   Respiratory: Denies cough, shortness of breath  Cardiovascular: Denies chest pain, chest discomfort Gastrointestinal:  Denies nausea, vomiting, or abdominal pain GU: Chronic indwelling catheter Lymphatics: Denies new lymphadenopathy   PHYSICAL  EXAMINATION: ECOG PERFORMANCE STATUS: 1 - Symptomatic but completely ambulatory  Vitals:   03/29/23 1030  BP: (!) 158/92  Pulse: 70  Resp: 18  Temp: 97.7 F (36.5 C)  SpO2: 97%   Filed Weights   03/29/23 1030  Weight: 208 lb 3.2 oz (94.4 kg)    GENERAL: alert, no distress and comfortable SKIN: skin color is normal, no jaundice EYES: sclera clear NECK: supple LYMPH:  no palpable lymphadenopathy in the cervical, axillary regions LUNGS: Effort normal, no respiratory distress.  Clear to auscultation bilaterally HEART: regular rate & rhythm and no lower extremity edema ABDOMEN: soft, non-tender and nondistended Urinary catheter in place.  Urine color normal and clear Musculoskeletal: no point tenderness NEURO: no focal motor/sensory deficits  LABORATORY DATA:  I have reviewed the data as listed Lab Results  Component Value Date   WBC 4.7 12/25/2022   HGB 13.6 12/25/2022   HCT 41.8 12/25/2022   MCV 93.3 12/25/2022   PLT 276 12/25/2022   Recent Labs    04/17/22 0816 08/29/22 0000 12/25/22 0825  NA 138 140 140  K 4.9 4.8 4.2  CL 104 103 102  CO2 28 29 28   GLUCOSE 116* 111* 120*  BUN 19 17 14   CREATININE 0.81 0.75 0.71  CALCIUM 9.5 9.7 9.3  PROT 7.2 7.6 7.6  AST 14 14 15   ALT 10 12 12   BILITOT 0.5 0.5 0.4    RADIOGRAPHIC STUDIES: I have personally reviewed the radiological images as listed and agreed with the findings in the report. No results found.

## 2023-03-27 DIAGNOSIS — Z9189 Other specified personal risk factors, not elsewhere classified: Secondary | ICD-10-CM | POA: Insufficient documentation

## 2023-03-27 DIAGNOSIS — C7951 Secondary malignant neoplasm of bone: Secondary | ICD-10-CM | POA: Insufficient documentation

## 2023-03-27 NOTE — Assessment & Plan Note (Signed)
mHSPC with bone and lymph node metastases. Discussed triplet therapy per ARASENS trial. An international, phase 3 trial, we randomly assigned patients with metastatic, hormone-sensitive prostate cancer in a 1:1 ratio to receive darolutamide 600-mg twice daily) or matching placebo, both in combination with androgen-deprivation therapy and docetaxel. Bone metastases were well represented.  The overall survival at 4 years was 62.7% in the darolutamide group and 50.4% in the placebo group. M1 disease was well represented. We do not have data without docetaxel in this study. Time to CRPC was over 4 years with triplet therapy.   We discussed docetaxel chemotherapy.  Potential side effect including fatigue, neutropenia, febrile neutropenia, thrombocytopenia, anemia, alopecia, skin rash, loss of nail and potentially infection, hypersensitivity, fluid retention with edema, diarrhea, nausea, vomiting, mouth sores, liver enzyme elevation, neuropathy, muscle pain, joint pain, visual change and rarely severe allergic reaction, skin rash, severe infection resulting in sepsis, and potential death.  Discussed the importance of recognizing fever, signs of infection while on treatment. Proceed to ED for emergency evaluation in the setting of fever, infection. Severe infection resulting in sepsis can be life threatening and result in death if no treated early. 4% of deaths were reported in both group.  ARPI can cause fatigue, and rarely rash, heart failure, ischemic heart disease in <5%.  In patients undergoing ADT with prostate cancer, a question of manage cardiovascular risk factors to decrease cardiac events recommended including controlling hypertension, hyperlipidemia, prevent diabetes and regular exercises are recommended.   After discussion, David [name] would like to

## 2023-03-27 NOTE — Assessment & Plan Note (Signed)
  Supportive baseline bone mineral density study and then every 2 years calcium (1000-1200 mg daily from food and supplements) and vitamin D3 (1000 IU daily) Zometa (5 mg IV annually) for osteopenia (T-score between -1.0 and -2.5) on ADT after dental clearance. If CRPC, 4 mg every 3 months if having bone metastases. Control and prevent diabetes Weight-bearing exercises (30 minutes per day) Limit alcohol consumption and avoid smoking

## 2023-03-29 ENCOUNTER — Encounter: Payer: Self-pay | Admitting: Genetic Counselor

## 2023-03-29 ENCOUNTER — Inpatient Hospital Stay (HOSPITAL_BASED_OUTPATIENT_CLINIC_OR_DEPARTMENT_OTHER): Payer: Medicare PPO | Admitting: Genetic Counselor

## 2023-03-29 ENCOUNTER — Inpatient Hospital Stay: Payer: Medicare PPO

## 2023-03-29 ENCOUNTER — Inpatient Hospital Stay: Payer: Medicare PPO | Admitting: Genetic Counselor

## 2023-03-29 ENCOUNTER — Other Ambulatory Visit: Payer: Self-pay | Admitting: Genetic Counselor

## 2023-03-29 ENCOUNTER — Other Ambulatory Visit: Payer: Medicare PPO

## 2023-03-29 VITALS — BP 158/92 | HR 70 | Temp 97.7°F | Resp 18 | Wt 208.2 lb

## 2023-03-29 DIAGNOSIS — Z800 Family history of malignant neoplasm of digestive organs: Secondary | ICD-10-CM

## 2023-03-29 DIAGNOSIS — Z803 Family history of malignant neoplasm of breast: Secondary | ICD-10-CM | POA: Diagnosis not present

## 2023-03-29 DIAGNOSIS — Z806 Family history of leukemia: Secondary | ICD-10-CM

## 2023-03-29 DIAGNOSIS — C7951 Secondary malignant neoplasm of bone: Secondary | ICD-10-CM

## 2023-03-29 DIAGNOSIS — Z79899 Other long term (current) drug therapy: Secondary | ICD-10-CM | POA: Diagnosis not present

## 2023-03-29 DIAGNOSIS — Z87891 Personal history of nicotine dependence: Secondary | ICD-10-CM | POA: Diagnosis not present

## 2023-03-29 DIAGNOSIS — Z9189 Other specified personal risk factors, not elsewhere classified: Secondary | ICD-10-CM | POA: Diagnosis not present

## 2023-03-29 DIAGNOSIS — I714 Abdominal aortic aneurysm, without rupture, unspecified: Secondary | ICD-10-CM | POA: Insufficient documentation

## 2023-03-29 DIAGNOSIS — I7143 Infrarenal abdominal aortic aneurysm, without rupture: Secondary | ICD-10-CM | POA: Diagnosis not present

## 2023-03-29 DIAGNOSIS — C61 Malignant neoplasm of prostate: Secondary | ICD-10-CM

## 2023-03-29 LAB — CMP (CANCER CENTER ONLY)
ALT: 13 U/L (ref 0–44)
AST: 17 U/L (ref 15–41)
Albumin: 4.4 g/dL (ref 3.5–5.0)
Alkaline Phosphatase: 94 U/L (ref 38–126)
Anion gap: 6 (ref 5–15)
BUN: 19 mg/dL (ref 8–23)
CO2: 29 mmol/L (ref 22–32)
Calcium: 9.8 mg/dL (ref 8.9–10.3)
Chloride: 105 mmol/L (ref 98–111)
Creatinine: 0.63 mg/dL (ref 0.61–1.24)
GFR, Estimated: >60 mL/min (ref >60)
Glucose, Bld: 102 mg/dL — ABNORMAL HIGH (ref 70–99)
Potassium: 4.4 mmol/L (ref 3.5–5.1)
Sodium: 140 mmol/L (ref 135–145)
Total Bilirubin: 0.5 mg/dL (ref 0.3–1.2)
Total Protein: 8.2 g/dL — ABNORMAL HIGH (ref 6.5–8.1)

## 2023-03-29 LAB — CBC WITH DIFFERENTIAL (CANCER CENTER ONLY)
Abs Immature Granulocytes: 0.02 10*3/uL (ref 0.00–0.07)
Basophils Absolute: 0.1 10*3/uL (ref 0.0–0.1)
Basophils Relative: 1 %
Eosinophils Absolute: 0.1 10*3/uL (ref 0.0–0.5)
Eosinophils Relative: 1 %
HCT: 43.1 % (ref 39.0–52.0)
Hemoglobin: 14.2 g/dL (ref 13.0–17.0)
Immature Granulocytes: 0 %
Lymphocytes Relative: 27 %
Lymphs Abs: 1.3 10*3/uL (ref 0.7–4.0)
MCH: 31.4 pg (ref 26.0–34.0)
MCHC: 32.9 g/dL (ref 30.0–36.0)
MCV: 95.4 fL (ref 80.0–100.0)
Monocytes Absolute: 0.5 10*3/uL (ref 0.1–1.0)
Monocytes Relative: 10 %
Neutro Abs: 2.9 10*3/uL (ref 1.7–7.7)
Neutrophils Relative %: 61 %
Platelet Count: 199 10*3/uL (ref 150–400)
RBC: 4.52 MIL/uL (ref 4.22–5.81)
RDW: 13.3 % (ref 11.5–15.5)
WBC Count: 4.8 10*3/uL (ref 4.0–10.5)
nRBC: 0 % (ref 0.0–0.2)

## 2023-03-29 LAB — GENETIC SCREENING ORDER

## 2023-03-29 NOTE — Progress Notes (Signed)
REFERRING PROVIDER: Melven Sartorius, MD [address] [place],  [place] [address]  PRIMARY PROVIDER:  Octavia Heir, NP  PRIMARY REASON FOR VISIT:  Encounter Diagnoses  Name Primary?   Prostate cancer metastatic to bone [facility]) Yes   Family history of breast cancer    HISTORY OF PRESENT ILLNESS:   David [name], a 77 y.o. male, was seen for a [place] cancer genetics consultation at the request of Dr. Cherly Hensen due to a personal history of metastatic prostate cancer.  David [name] presents to clinic today to discuss the possibility of a hereditary predisposition to cancer, to discuss genetic testing, and to further clarify his future cancer risks, as well as potential cancer risks for family members.   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   CANCER HISTORY:  Oncology History  Prostate cancer metastatic to bone [facility])  08/2022 Surgery   TURP for BPH: Prostatic adenocarcinoma, Gleason score 4+5=9 (grade group5) involves 80% of the resected tissue.  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    08/2022 Initial Diagnosis   Prostate cancer metastatic to bone (HCC)   09/28/2022 Tumor Marker   PSA 33.5   10/30/2022 Imaging   PSMA PET IMPRESSION: 1. There is diffuse radiotracer uptake throughout the prostate gland compatible with residual/recurrent prostate cancer. 2. Bilateral tracer avid pelvic lymph nodes compatible with nodal metastasis. 3. Multifocal tracer avid bone metastases. 4. Solitary tracer avid pulmonary nodule within the right upper lobe is identified. 5. 5.2 cm infrarenal abdominal aortic aneurysm. Recommend follow-up every 6 months and vascular consultation. This recommendation follows ACR consensus guidelines: White Paper of the ACR  Incidental Findings Committee II on Vascular Findings. J Am Coll Radiol 2013; 10:789-794. Aortic aneurysm NOS (ICD10-I71.9). 6.  Aortic Atherosclerosis (ICD10-I70.0).   11/2022 -  Chemotherapy   started Orgovynx and converted to Eligard.   03/02/2023 Tumor Marker   PSA 14.8 testosterone 19.7. alk phos 77     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    Flatulence, eructation, and gas pain    Headache(784.0)    Hemorrhoids, external    Herpes zoster with unspecified complication    Hyperpotassemia    Insomnia, unspecified    Lumbago    Lumbago    Myalgia and myositis, unspecified    Nocturia    Obesity, unspecified    Osteoarthrosis, unspecified whether generalized or localized, unspecified site    Other abnormal blood chemistry    Other headache syndromes(339.89)    Pre-diabetes    Pure hyperglyceridemia    Rash and other nonspecific skin eruption    Rotator cuff (capsule) sprain    Routine general medical examination at a health care facility    Tobacco use disorder    Unspecified essential hypertension    Unspecified hypertrophic and atrophic condition of skin    Unspecified sleep apnea     Past Surgical History:  Procedure Laterality Date   CYSTOSCOPY  N/A 08/22/2022   Procedure: CYSTOSCOPY;  Surgeon: Belva Agee, MD;  Location: [facility];  Service: Urology;  Laterality: N/A;   DENTAL SURGERY  05/10/2021   Per patient   HEMORRHOID SURGERY     TRANSURETHRAL RESECTION OF PROSTATE N/A 08/22/2022   Procedure: TRANSURETHRAL RESECTION OF THE PROSTATE (TURP);  Surgeon: Belva Agee, MD;  Location: [facility];  Service: Urology;  Laterality: N/A;   tumorectomy Left    Removed tumor from his chest.   VARICOSE VEIN SURGERY      FAMILY HISTORY:  We obtained a detailed, 4-generation family history.  Significant diagnoses are listed below: Family History  Problem Relation Age of Onset   Leukemia Maternal Aunt        dx >50   Breast cancer Paternal Aunt        dx >50   Stomach cancer Cousin        d. 96; pat male cousin     David [name] is unaware of previous family history of genetic testing for hereditary cancer risks.  There is no reported Ashkenazi Jewish ancestry. There is no known consanguinity.  GENETIC COUNSELING ASSESSMENT: David [name] is a 77 y.o. male with a personal history of metastatic prostate cancer, which is somewhat suggestive of a hereditary cancer syndrome. We, therefore, discussed and recommended the following at today's visit.   DISCUSSION: We discussed that 5 - 10% of cancer is hereditary.  Most cases of hereditary prostate cancer are associated with mutations in BRCA1/2.  There are other genes that can be associated with hereditary prostate cancer syndromes.  We discussed that testing is beneficial for several reasons including knowing how to follow individuals for their cancer risks, identifying whether potential treatment options, such as PARP inhibitors, would be beneficial, and understanding if other family members could be at risk for cancer and allowing them to undergo genetic testing.   We reviewed the characteristics, features and inheritance patterns of hereditary cancer syndromes. We also discussed genetic testing, including the appropriate family members to test, the process of testing, insurance coverage and turn-around-time for results. We discussed the implications of a negative, positive, carrier and/or variant of uncertain significant result. We recommended David [name] pursue genetic testing for a panel that includes genes associated with prostate cancer  and other cancers.   The CustomNext-Cancer+RNAinsight panel offered by [facility] includes sequencing, rearrangement, and RNA analysis for the following 47 genes:  APC, ATM, AXIN2, BAP1, BARD1, BMPR1A, BRCA1, BRCA2, BRIP1, CDH1, CDK4, CDKN2A, CHEK2, CTNNA1, DICER1, EPCAM, FH, GREM1, HOXB13, KIT, MBD4, MEN1, MLH1, MSH2, MSH3, MSH6, MUTYH, NF1, NTHL1, PALB2, PDGFRA, PMS2, POLD1, POLE, PTEN, RAD51C, RAD51D, SDHA, SDHB, SDHC, SDHD, SMAD4, SMARCA4, STK11, TP53, TSC1, TSC2, VHL.    Based on David [name] personal history of metastatic prostate cancer, he meets medical criteria for genetic testing. Despite that he meets criteria, he may still have an out of pocket cost. If his out of pocket cost for testing is over $100, the laboratory should contact him and discuss the self-pay prices and/or patient pay assistance programs.    PLAN: After considering the risks, benefits, and limitations, David [name] provided informed consent to pursue genetic testing and the blood sample was sent to [facility] for analysis of the CustomNext-Cancer+RNAinsight Panel. Results should be available within approximately 2-3 weeks' time, at which point they will be disclosed by telephone to David [name], as will any additional recommendations warranted by these results. David [name] will receive a  summary of his genetic counseling visit and a copy of his results once available. This information will also be available in Epic.   David [name] questions were answered to his satisfaction today. Our contact information was provided should additional questions or concerns arise. Thank you for the referral and allowing [place] to share in the care of your patient.   Keonta Monceaux M. Rennie Plowman, MS, [facility] Genetic Counselor [email or web address] (P) [phone]  The patient was seen for a total of 20 minutes in face-to-face genetic counseling.  The patient was seen alone.  Drs. Gunnar Bulla, and/or Lyons were available to discuss this case  as needed.  _______________________________________________________________________ For Office Staff:  Number of people involved in session: 1 Was an Intern/ student involved with case: no

## 2023-03-29 NOTE — Assessment & Plan Note (Addendum)
Obtain US Refer to vascular surgery

## 2023-03-29 NOTE — Patient Instructions (Addendum)
We discussed your prostate cancer is stage 4. It is not curable but treatable. Normally we will give chemotherapy called docetaxel on top of injection you had and another pill to help fight this cancer. Study showed this combination can improve survival.  However, given his abdominal aortic aneurysm, the concern is worsening aneurysm.  Aortic rupture has been reported.  This is life-threatening and can cause death.  I recommend follow-up with an ultrasound and then see a vascular surgeon for evaluation.  The pill Urology gave you will help your prostate cancer in the meantime. We will try to figure out the status for you. You will continue your injection that your started for the cancer.  We will need to monitor your blood pressure.   Please take vitamin D 1000-2000 international unit  with 1200 mg Calcium daily.  You will see a genetic counselor to check blood. This will help [place] determine future treatment option for your prostate cancer.  You may return to see me in about 4 weeks.

## 2023-03-30 NOTE — Progress Notes (Addendum)
RN left message for call back.  RN spoke with [facility] regarding medication, Nubeqa, and they are still working to obtain authorization for this medication.   Patient is set up for u/s of abd to evaluate his AAA on 9/25.

## 2023-03-30 NOTE — Progress Notes (Signed)
Introduced myself to the patient as the prostate nurse navigator.  He is here to discuss his medical oncology treatment options with Dr. Cherly Hensen. Patient is unsure if he is taking medication, Nubeqa, prescribed at the [facility] at [facility].  RN will reach out to [facility] to see if we can obtain any information on his starting of this medication.

## 2023-03-31 LAB — TESTOSTERONE: Testosterone: 12 ng/dL — ABNORMAL LOW (ref 264–916)

## 2023-03-31 LAB — PSA, TOTAL AND FREE
PSA, Free Pct: 11 %
PSA, Free: 1.85 ng/mL
Prostate Specific Ag, Serum: 16.8 ng/mL — ABNORMAL HIGH (ref 0.0–4.0)

## 2023-04-02 DIAGNOSIS — Z961 Presence of intraocular lens: Secondary | ICD-10-CM | POA: Diagnosis not present

## 2023-04-02 DIAGNOSIS — H524 Presbyopia: Secondary | ICD-10-CM | POA: Diagnosis not present

## 2023-04-02 DIAGNOSIS — H26493 Other secondary cataract, bilateral: Secondary | ICD-10-CM | POA: Diagnosis not present

## 2023-04-02 NOTE — Progress Notes (Signed)
RN spoke with patient to update on status of Nubeqa.  Patient is set up with vascular for evaluation on his AAA with imaging prior on 11/6, therefore we will cancel his u/s scheduled for 9/25.  RN provided update to patient and will mail detailed calendar.

## 2023-04-04 ENCOUNTER — Other Ambulatory Visit: Payer: Self-pay

## 2023-04-04 ENCOUNTER — Ambulatory Visit (HOSPITAL_COMMUNITY): Payer: Medicare PPO

## 2023-04-04 ENCOUNTER — Telehealth: Payer: Self-pay

## 2023-04-04 ENCOUNTER — Telehealth: Payer: Self-pay | Admitting: Radiation Oncology

## 2023-04-04 DIAGNOSIS — C61 Malignant neoplasm of prostate: Secondary | ICD-10-CM

## 2023-04-04 NOTE — Telephone Encounter (Signed)
TC to patient per Dr. Nelta Numbers request to inform him that Dr. Cherly Hensen has placed a referral to radiation oncology due to his bone lesion in L femoral area, in hopes of preventing a potential pathological fracture. Pt aware to be expecting a phone call to set up appointment. Pt verbalizes frustration over having "too many appointments to keep up with." Expressed empathy and reviewed upcoming appointments with pt. Per notes of nurse navigator, pt has been mailed a calendar of upcoming appointments (he does not have a computer). Instructed pt to call office for any questions or concerns.

## 2023-04-04 NOTE — Telephone Encounter (Signed)
Called patient to schedule a consultation w. Dr. Kathrynn Running. No answer, unable to LVM, line just rings.

## 2023-04-04 NOTE — Progress Notes (Signed)
Case discussed at [facility], volume to disease too high for definitive radiation. The is a concern for left side pathologic fracture. Referral placed to radiation oncology for evaluation for radiation.  David [name] would you let patient know I am refering him to radiation oncology. Radiation to the left femoral area to prevent fracture. Thank you.

## 2023-04-05 DIAGNOSIS — C778 Secondary and unspecified malignant neoplasm of lymph nodes of multiple regions: Secondary | ICD-10-CM | POA: Diagnosis not present

## 2023-04-05 DIAGNOSIS — C7951 Secondary malignant neoplasm of bone: Secondary | ICD-10-CM | POA: Diagnosis not present

## 2023-04-05 DIAGNOSIS — R338 Other retention of urine: Secondary | ICD-10-CM | POA: Diagnosis not present

## 2023-04-05 DIAGNOSIS — C61 Malignant neoplasm of prostate: Secondary | ICD-10-CM | POA: Diagnosis not present

## 2023-04-05 NOTE — Addendum Note (Signed)
Addended by: Deberah Castle on: 04/05/2023 03:18 PM   Modules accepted: Orders

## 2023-04-05 NOTE — Progress Notes (Addendum)
RN spoke with patient to review MD's recommendations to proceed with Rad Onc Consult.  Pt verbalized understanding.  RN reviewed upcoming appointments in detail and will mail new updated calendar out as well.    Patient was not approved for Nubeqa.  Patient has been started on [facility] after seeing [facility].

## 2023-04-06 ENCOUNTER — Ambulatory Visit (HOSPITAL_BASED_OUTPATIENT_CLINIC_OR_DEPARTMENT_OTHER)
Admission: RE | Admit: 2023-04-06 | Discharge: 2023-04-06 | Disposition: A | Discharge status: Home / Self Care | Payer: Medicare PPO | Source: Ambulatory Visit

## 2023-04-06 DIAGNOSIS — Z9189 Other specified personal risk factors, not elsewhere classified: Secondary | ICD-10-CM | POA: Insufficient documentation

## 2023-04-06 DIAGNOSIS — M85851 Other specified disorders of bone density and structure, right thigh: Secondary | ICD-10-CM | POA: Diagnosis not present

## 2023-04-06 DIAGNOSIS — Z13820 Encounter for screening for osteoporosis: Secondary | ICD-10-CM | POA: Insufficient documentation

## 2023-04-06 DIAGNOSIS — C7951 Secondary malignant neoplasm of bone: Secondary | ICD-10-CM | POA: Insufficient documentation

## 2023-04-06 DIAGNOSIS — C61 Malignant neoplasm of prostate: Secondary | ICD-10-CM | POA: Insufficient documentation

## 2023-04-11 NOTE — Progress Notes (Signed)
Histology and Location of Primary Cancer: Prostate Cancer metastatic to bone  Gleason Score is (4 + 5) and PSA is (16.8 on 03/29/2023)  Sites of Visceral and Bony Metastatic Disease: Left hip  Location(s) of Symptomatic Metastases: Left hip  PSA 14.8 on 03/03/2023 PSA 33.5 on 09/28/2022  Biopsy   10/30/2022 Jennaya L. Earlene Plater, NP [place] PET (PSMA) Skull to Mid Thigh CLINICAL DATA: Prostate carcinoma. PSA of 33.5.   IMPRESSION: 1. There is diffuse radiotracer uptake throughout the prostate gland compatible with residual/recurrent prostate cancer. 2. Bilateral tracer avid pelvic lymph nodes compatible with nodal metastasis. 3. Multifocal tracer avid bone metastases. 4. Solitary tracer avid pulmonary nodule within the right upper lobe is identified. 5. 5.2 cm infrarenal abdominal aortic aneurysm. Recommend follow-up every 6 months and vascular consultation. This recommendation follows ACR consensus guidelines: White Paper of the ACR Incidental Findings Committee II on Vascular Findings. J Am Coll Radiol 2013; 10:789-794. Aortic aneurysm NOS (ICD10-I71.9). 6.  Aortic Atherosclerosis (ICD10-I70.0).  Past/Anticipated interventions by urology, if any: NA  Past/Anticipated interventions by medical oncology, if any: NA  Weight changes, if any: No  If Spine Met(s), symptoms, if any, include: Bowel/Bladder retention or incontinence (please describe):  Has urinary catheter.  No bowel issues. Numbness or weakness in extremities (please describe): No Current Decadron regimen, if applicable: No Pain on a scale of 0-10 is:  4/10 Groin  Ambulatory status? Walker? Wheelchair?: Independent  SAFETY ISSUES: Prior radiation? No Pacemaker/ICD? No Possible current pregnancy? Male Is the patient on methotrexate? No  Current Complaints / other details:  Has urinary catheter.

## 2023-04-12 ENCOUNTER — Encounter: Payer: Self-pay | Admitting: Genetic Counselor

## 2023-04-12 ENCOUNTER — Telehealth: Payer: Self-pay | Admitting: Genetic Counselor

## 2023-04-12 DIAGNOSIS — Z1379 Encounter for other screening for genetic and chromosomal anomalies: Secondary | ICD-10-CM | POA: Insufficient documentation

## 2023-04-12 NOTE — Telephone Encounter (Signed)
Called in attempt to disclose genetic testing results.  No answer and unable to LVM.  First attempt.

## 2023-04-12 NOTE — Progress Notes (Signed)
RN spoke with David [name] at [facility].  Patient will be picking up Xtandi on 10/24.  RN spoke with patient and reviewed upcoming appointments. Patient aware, no additional needs at this time.

## 2023-04-14 DIAGNOSIS — C61 Malignant neoplasm of prostate: Secondary | ICD-10-CM | POA: Diagnosis not present

## 2023-04-15 DIAGNOSIS — C61 Malignant neoplasm of prostate: Secondary | ICD-10-CM | POA: Diagnosis not present

## 2023-04-15 DIAGNOSIS — C7951 Secondary malignant neoplasm of bone: Secondary | ICD-10-CM | POA: Diagnosis not present

## 2023-04-15 NOTE — Progress Notes (Signed)
Radiation Oncology         [phone]) [phone] ________________________________  Name: David [name]. MRN: [id number]  Date: 04/16/2023  DOB: [birth year]  SIMULATION AND TREATMENT PLANNING NOTE    ICD-10-CM   1. Prostate cancer metastatic to bone [facility])  C61    C79.51       DIAGNOSIS:  77 yo man with Stage IV prostate cancer and left proximal femur metastasis  NARRATIVE:  The patient was brought to the CT Simulation planning suite.  Identity was confirmed.  All relevant records and images related to the planned course of therapy were reviewed.  The patient freely provided informed written consent to proceed with treatment after reviewing the details related to the planned course of therapy. The consent form was witnessed and verified by the simulation staff.  Then, the patient was set-up in a stable reproducible  supine position for radiation therapy.  CT images were obtained.  Surface markings were placed.  The CT images were loaded into the planning software.  Then the target and avoidance structures were contoured.  Treatment planning then occurred.  The radiation prescription was entered and confirmed.  Then, I designed and supervised the construction of a total of 3 medically necessary complex treatment devices consisting of leg positioner and MLC apertures to cover the treated hip area.  I have requested : 3D Simulation  I have requested a DVH of the following structures: Rectum, Bladder, femoral heads and target.  PLAN:  The patient will receive 30 Gy in 10 fractions.  ________________________________  Artist Pais Kathrynn Running, M.D.

## 2023-04-16 ENCOUNTER — Ambulatory Visit
Admission: RE | Admit: 2023-04-16 | Discharge: 2023-04-16 | Disposition: A | Discharge status: Home / Self Care | Payer: Medicare PPO | Source: Ambulatory Visit | Attending: Radiation Oncology | Admitting: Radiation Oncology

## 2023-04-16 ENCOUNTER — Ambulatory Visit: Payer: Self-pay | Admitting: Genetic Counselor

## 2023-04-16 ENCOUNTER — Telehealth: Payer: Self-pay | Admitting: Genetic Counselor

## 2023-04-16 ENCOUNTER — Encounter: Payer: Self-pay | Admitting: Radiation Oncology

## 2023-04-16 VITALS — BP 136/78 | HR 98 | Temp 97.0°F | Resp 18 | Ht 70.0 in | Wt 207.5 lb

## 2023-04-16 DIAGNOSIS — Z79899 Other long term (current) drug therapy: Secondary | ICD-10-CM | POA: Diagnosis not present

## 2023-04-16 DIAGNOSIS — M810 Age-related osteoporosis without current pathological fracture: Secondary | ICD-10-CM | POA: Diagnosis not present

## 2023-04-16 DIAGNOSIS — G4700 Insomnia, unspecified: Secondary | ICD-10-CM | POA: Insufficient documentation

## 2023-04-16 DIAGNOSIS — C7951 Secondary malignant neoplasm of bone: Secondary | ICD-10-CM | POA: Diagnosis present

## 2023-04-16 DIAGNOSIS — C61 Malignant neoplasm of prostate: Secondary | ICD-10-CM | POA: Insufficient documentation

## 2023-04-16 DIAGNOSIS — R21 Rash and other nonspecific skin eruption: Secondary | ICD-10-CM | POA: Insufficient documentation

## 2023-04-16 DIAGNOSIS — I70209 Unspecified atherosclerosis of native arteries of extremities, unspecified extremity: Secondary | ICD-10-CM | POA: Diagnosis not present

## 2023-04-16 DIAGNOSIS — Z87891 Personal history of nicotine dependence: Secondary | ICD-10-CM | POA: Insufficient documentation

## 2023-04-16 DIAGNOSIS — K449 Diaphragmatic hernia without obstruction or gangrene: Secondary | ICD-10-CM | POA: Insufficient documentation

## 2023-04-16 DIAGNOSIS — Z1379 Encounter for other screening for genetic and chromosomal anomalies: Secondary | ICD-10-CM

## 2023-04-16 DIAGNOSIS — Z510 Encounter for antineoplastic radiation therapy: Secondary | ICD-10-CM | POA: Insufficient documentation

## 2023-04-16 DIAGNOSIS — I10 Essential (primary) hypertension: Secondary | ICD-10-CM | POA: Diagnosis not present

## 2023-04-16 DIAGNOSIS — R918 Other nonspecific abnormal finding of lung field: Secondary | ICD-10-CM | POA: Diagnosis not present

## 2023-04-16 DIAGNOSIS — E875 Hyperkalemia: Secondary | ICD-10-CM | POA: Diagnosis not present

## 2023-04-16 DIAGNOSIS — Z803 Family history of malignant neoplasm of breast: Secondary | ICD-10-CM

## 2023-04-16 DIAGNOSIS — M1990 Unspecified osteoarthritis, unspecified site: Secondary | ICD-10-CM | POA: Diagnosis not present

## 2023-04-16 HISTORY — DX: Elevated prostate specific antigen (PSA): R97.20

## 2023-04-16 NOTE — Progress Notes (Signed)
Radiation Oncology         [phone]) [phone] ________________________________  Initial outpatient Consultation  Name: David [name]. 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 Malignant neoplasm of prostate metastatic to bone (HCC)  C61    C79.51     2. Prostate cancer metastatic to bone [facility])  C61    C79.51       HISTORY OF PRESENT ILLNESS: David [name]. is a 77 y.o. male seen at the request of Dr. Cherly Hensen. He was initially referred to Dr. Benancio Deeds in 12/2021 for BPH with LUTS. He had been started on finasteride by his PCP, followed by silodosin by Dr. Benancio Deeds. He developed urinary retention in 03/2022, and foley catheter was placed. He was subsequently taken for cystoscopy with TURP on 08/22/22 under Dr. Benancio Deeds. Pathology from the resected prostate showed Gleason 4+5 prostatic adenocarcinoma, involving 80% of tissue. A PSA was obtained on 09/28/22 showing significant elevation to 33.5. He underwent staging PSMA PET scan on 10/30/22 showing: diffuse radiotracer uptake throughout prostate gland; bilateral tracer-avid pelvic lymph nodes; multifocal tracer-avid bone metastases (left iliac, left femur, right 2nd rib); solitary tracer-avid pulmonary nodule within RUL. He was subsequently started on ADT with Orgovynx in 11/2022. He was switched to Eligard, which he continues on.  Since starting treatment, his PSA dropped to 14.8 on 03/02/23. He was referred to Dr. Cherly Hensen in medical oncology on 03/29/23 for evaluation and discussion of treatment options. A repeat PSA obtained that day showed a rise to 16.8. Of note, he also underwent genetic testing the same day, and results were negative.  PREVIOUS RADIATION THERAPY: No  PAST MEDICAL HISTORY:  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    Elevated PSA    Flatulence, eructation, and gas pain    Headache(784.0)    Hemorrhoids, external    Herpes zoster with unspecified complication    Hyperpotassemia    Insomnia, unspecified    Lumbago    Lumbago    Myalgia and myositis, unspecified    Nocturia    Obesity, unspecified    Osteoarthrosis, unspecified whether generalized or localized, unspecified site    Other abnormal blood chemistry    Other headache syndromes(339.89)    Pre-diabetes    Pure hyperglyceridemia    Rash and other nonspecific skin eruption    Rotator cuff (capsule) sprain    Routine general medical examination at a health care facility    Tobacco use disorder    Unspecified essential hypertension    Unspecified hypertrophic and atrophic condition of skin    Unspecified sleep apnea       PAST SURGICAL HISTORY: Past Surgical History:  Procedure Laterality Date   CYSTOSCOPY N/A 08/22/2022   Procedure: CYSTOSCOPY;  Surgeon: Belva Agee, MD;  Location: [facility];  Service: Urology;  Laterality: N/A;   DENTAL SURGERY  05/10/2021   Per patient  Radiation Oncology         [phone]) [phone] ________________________________  Initial outpatient Consultation  Name: David [name]. 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 Malignant neoplasm of prostate metastatic to bone (HCC)  C61    C79.51     2. Prostate cancer metastatic to bone [facility])  C61    C79.51       HISTORY OF PRESENT ILLNESS: David [name]. is a 77 y.o. male seen at the request of Dr. Cherly Hensen. He was initially referred to Dr. Benancio Deeds in 12/2021 for BPH with LUTS. He had been started on finasteride by his PCP, followed by silodosin by Dr. Benancio Deeds. He developed urinary retention in 03/2022, and foley catheter was placed. He was subsequently taken for cystoscopy with TURP on 08/22/22 under Dr. Benancio Deeds. Pathology from the resected prostate showed Gleason 4+5 prostatic adenocarcinoma, involving 80% of tissue. A PSA was obtained on 09/28/22 showing significant elevation to 33.5. He underwent staging PSMA PET scan on 10/30/22 showing: diffuse radiotracer uptake throughout prostate gland; bilateral tracer-avid pelvic lymph nodes; multifocal tracer-avid bone metastases (left iliac, left femur, right 2nd rib); solitary tracer-avid pulmonary nodule within RUL. He was subsequently started on ADT with Orgovynx in 11/2022. He was switched to Eligard, which he continues on.  Since starting treatment, his PSA dropped to 14.8 on 03/02/23. He was referred to Dr. Cherly Hensen in medical oncology on 03/29/23 for evaluation and discussion of treatment options. A repeat PSA obtained that day showed a rise to 16.8. Of note, he also underwent genetic testing the same day, and results were negative.  PREVIOUS RADIATION THERAPY: No  PAST MEDICAL HISTORY:  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    Elevated PSA    Flatulence, eructation, and gas pain    Headache(784.0)    Hemorrhoids, external    Herpes zoster with unspecified complication    Hyperpotassemia    Insomnia, unspecified    Lumbago    Lumbago    Myalgia and myositis, unspecified    Nocturia    Obesity, unspecified    Osteoarthrosis, unspecified whether generalized or localized, unspecified site    Other abnormal blood chemistry    Other headache syndromes(339.89)    Pre-diabetes    Pure hyperglyceridemia    Rash and other nonspecific skin eruption    Rotator cuff (capsule) sprain    Routine general medical examination at a health care facility    Tobacco use disorder    Unspecified essential hypertension    Unspecified hypertrophic and atrophic condition of skin    Unspecified sleep apnea       PAST SURGICAL HISTORY: Past Surgical History:  Procedure Laterality Date   CYSTOSCOPY N/A 08/22/2022   Procedure: CYSTOSCOPY;  Surgeon: Belva Agee, MD;  Location: [facility];  Service: Urology;  Laterality: N/A;   DENTAL SURGERY  05/10/2021   Per patient  of the nitrogen mustards in the palliative treatment of carcinoma: with particular reference to bronchogenic carcinoma Cancer 1 634-56  LABORATORY DATA:  Lab Results  Component Value  Date   WBC 4.8 03/29/2023   HGB 14.2 03/29/2023   HCT 43.1 03/29/2023   MCV 95.4 03/29/2023   PLT 199 03/29/2023   Lab Results  Component Value Date   NA 140 03/29/2023   K 4.4 03/29/2023   CL 105 03/29/2023   CO2 29 03/29/2023   Lab Results  Component Value Date   ALT 13 03/29/2023   AST 17 03/29/2023   ALKPHOS 94 03/29/2023   BILITOT 0.5 03/29/2023     RADIOGRAPHY: DG BONE DENSITY (DXA)  Result Date: 04/06/2023 EXAM: DUAL X-RAY ABSORPTIOMETRY (DXA) FOR BONE MINERAL DENSITY IMPRESSION: Referring Physician:  Melven Sartorius Your patient completed a bone mineral density test using GE Lunar iDXA system (analysis version: 16). Technologist: CAM PATIENT: Name: Hillel, Card Patient ID: [id number] Birth Date: 1945-10-13 Height: 70.0 in. Sex: Male Measured: 04/06/2023 Weight: 208.2 lbs. Indications: Advanced Age, Caucasian, Prostate Cancer Fractures: Ankle Treatments: Prolia ASSESSMENT: The BMD measured at Right Femur Neck is 0.791 g/cm2 with a T-score of -1.8. This patient is considered to have osteopenia/low bone mass according to World Health Organization [facility]) criteria. FRAX was not calclated due to: on BBM. Scan quality is good. Exclusions: Left femur and lumbar spine due to degenerative changes. Site Region Measured Date Measured Age YA BMD Significant CHANGE T-score Right Femur Neck 04/06/2023 77.7 -1.8 0.791 g/cm2 Left Forearm Radius 33% 04/06/2023 77.7 0.3 0.900 g/cm2 World Health Organization [facility]) criteria for post-menopausal, Caucasian Women: Normal       T-score at or above -1 SD Osteopenia   T-score between -1 and -2.5 SD Osteoporosis T-score at or below -2.5 SD RECOMMENDATION: 1. All patients should optimize calcium and vitamin D intake. 2. Consider FDA approved medical therapies in postmenopausal women and men aged 27 years and older, based on the following: a. A hip or vertebral (clinical or morphometric) fracture b. T-score = -2.5 at the femoral neck or spine after  appropriate evaluation to exclude secondary causes c. Low bone mass (T-score between -1.0 and -2.5 at the femoral neck or spine) and a 10- year probability of a hip fracture = 3% or a 10 year probability of a major osteoporosis-related fracture = 20% based on the [place]-adapted WHO algorithm. 3. Clinician judgement and/or patient preference may indicate treatment for people with10-year fracture probabilities above or below these levels. (Sample) All patients should ensure an adequate intake of dietary calcium and vitamin D. The NOF recommends adults under age 29 need 1,000 mg of calcium and 400-800 IU of vitamin D daily. Adults 50 and over need 1,200 mg of calcium and 800-1,000 IU of vitamin D daily. FOLLOW-UP: Patients with diagnosis of osteoporosis or at high risk for fracture should have regular bone mineral density tests . For patients eligible for Medicare routine testing is allowed once every 2 years. The testing frequency can be increased to one year for patients who have rapidly progressing disease, those who are receiving or discontinuing medical therapy to restore bone mass, or have additional risk factors. (Sample) People with diagnosed cases of osteoporosis or at high risk for fracture should have regular bone mineral density tests. For patients eligible for Medicare, routine testing is allowed once every 2 years. The testing frequency can be increased to one year for patients who have rapidly progressing disease, those who are receiving or discontinuing  of the nitrogen mustards in the palliative treatment of carcinoma: with particular reference to bronchogenic carcinoma Cancer 1 634-56  LABORATORY DATA:  Lab Results  Component Value  Date   WBC 4.8 03/29/2023   HGB 14.2 03/29/2023   HCT 43.1 03/29/2023   MCV 95.4 03/29/2023   PLT 199 03/29/2023   Lab Results  Component Value Date   NA 140 03/29/2023   K 4.4 03/29/2023   CL 105 03/29/2023   CO2 29 03/29/2023   Lab Results  Component Value Date   ALT 13 03/29/2023   AST 17 03/29/2023   ALKPHOS 94 03/29/2023   BILITOT 0.5 03/29/2023     RADIOGRAPHY: DG BONE DENSITY (DXA)  Result Date: 04/06/2023 EXAM: DUAL X-RAY ABSORPTIOMETRY (DXA) FOR BONE MINERAL DENSITY IMPRESSION: Referring Physician:  Melven Sartorius Your patient completed a bone mineral density test using GE Lunar iDXA system (analysis version: 16). Technologist: CAM PATIENT: Name: Hillel, Card Patient ID: [id number] Birth Date: 1945-10-13 Height: 70.0 in. Sex: Male Measured: 04/06/2023 Weight: 208.2 lbs. Indications: Advanced Age, Caucasian, Prostate Cancer Fractures: Ankle Treatments: Prolia ASSESSMENT: The BMD measured at Right Femur Neck is 0.791 g/cm2 with a T-score of -1.8. This patient is considered to have osteopenia/low bone mass according to World Health Organization [facility]) criteria. FRAX was not calclated due to: on BBM. Scan quality is good. Exclusions: Left femur and lumbar spine due to degenerative changes. Site Region Measured Date Measured Age YA BMD Significant CHANGE T-score Right Femur Neck 04/06/2023 77.7 -1.8 0.791 g/cm2 Left Forearm Radius 33% 04/06/2023 77.7 0.3 0.900 g/cm2 World Health Organization [facility]) criteria for post-menopausal, Caucasian Women: Normal       T-score at or above -1 SD Osteopenia   T-score between -1 and -2.5 SD Osteoporosis T-score at or below -2.5 SD RECOMMENDATION: 1. All patients should optimize calcium and vitamin D intake. 2. Consider FDA approved medical therapies in postmenopausal women and men aged 27 years and older, based on the following: a. A hip or vertebral (clinical or morphometric) fracture b. T-score = -2.5 at the femoral neck or spine after  appropriate evaluation to exclude secondary causes c. Low bone mass (T-score between -1.0 and -2.5 at the femoral neck or spine) and a 10- year probability of a hip fracture = 3% or a 10 year probability of a major osteoporosis-related fracture = 20% based on the [place]-adapted WHO algorithm. 3. Clinician judgement and/or patient preference may indicate treatment for people with10-year fracture probabilities above or below these levels. (Sample) All patients should ensure an adequate intake of dietary calcium and vitamin D. The NOF recommends adults under age 29 need 1,000 mg of calcium and 400-800 IU of vitamin D daily. Adults 50 and over need 1,200 mg of calcium and 800-1,000 IU of vitamin D daily. FOLLOW-UP: Patients with diagnosis of osteoporosis or at high risk for fracture should have regular bone mineral density tests . For patients eligible for Medicare routine testing is allowed once every 2 years. The testing frequency can be increased to one year for patients who have rapidly progressing disease, those who are receiving or discontinuing medical therapy to restore bone mass, or have additional risk factors. (Sample) People with diagnosed cases of osteoporosis or at high risk for fracture should have regular bone mineral density tests. For patients eligible for Medicare, routine testing is allowed once every 2 years. The testing frequency can be increased to one year for patients who have rapidly progressing disease, those who are receiving or discontinuing  Radiation Oncology         [phone]) [phone] ________________________________  Initial outpatient Consultation  Name: David [name]. 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 Malignant neoplasm of prostate metastatic to bone (HCC)  C61    C79.51     2. Prostate cancer metastatic to bone [facility])  C61    C79.51       HISTORY OF PRESENT ILLNESS: David [name]. is a 77 y.o. male seen at the request of Dr. Cherly Hensen. He was initially referred to Dr. Benancio Deeds in 12/2021 for BPH with LUTS. He had been started on finasteride by his PCP, followed by silodosin by Dr. Benancio Deeds. He developed urinary retention in 03/2022, and foley catheter was placed. He was subsequently taken for cystoscopy with TURP on 08/22/22 under Dr. Benancio Deeds. Pathology from the resected prostate showed Gleason 4+5 prostatic adenocarcinoma, involving 80% of tissue. A PSA was obtained on 09/28/22 showing significant elevation to 33.5. He underwent staging PSMA PET scan on 10/30/22 showing: diffuse radiotracer uptake throughout prostate gland; bilateral tracer-avid pelvic lymph nodes; multifocal tracer-avid bone metastases (left iliac, left femur, right 2nd rib); solitary tracer-avid pulmonary nodule within RUL. He was subsequently started on ADT with Orgovynx in 11/2022. He was switched to Eligard, which he continues on.  Since starting treatment, his PSA dropped to 14.8 on 03/02/23. He was referred to Dr. Cherly Hensen in medical oncology on 03/29/23 for evaluation and discussion of treatment options. A repeat PSA obtained that day showed a rise to 16.8. Of note, he also underwent genetic testing the same day, and results were negative.  PREVIOUS RADIATION THERAPY: No  PAST MEDICAL HISTORY:  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    Elevated PSA    Flatulence, eructation, and gas pain    Headache(784.0)    Hemorrhoids, external    Herpes zoster with unspecified complication    Hyperpotassemia    Insomnia, unspecified    Lumbago    Lumbago    Myalgia and myositis, unspecified    Nocturia    Obesity, unspecified    Osteoarthrosis, unspecified whether generalized or localized, unspecified site    Other abnormal blood chemistry    Other headache syndromes(339.89)    Pre-diabetes    Pure hyperglyceridemia    Rash and other nonspecific skin eruption    Rotator cuff (capsule) sprain    Routine general medical examination at a health care facility    Tobacco use disorder    Unspecified essential hypertension    Unspecified hypertrophic and atrophic condition of skin    Unspecified sleep apnea       PAST SURGICAL HISTORY: Past Surgical History:  Procedure Laterality Date   CYSTOSCOPY N/A 08/22/2022   Procedure: CYSTOSCOPY;  Surgeon: Belva Agee, MD;  Location: [facility];  Service: Urology;  Laterality: N/A;   DENTAL SURGERY  05/10/2021   Per patient

## 2023-04-16 NOTE — Telephone Encounter (Signed)
Disclosed negative genetics and VUS in [facility]SC[facility]. David [name]

## 2023-04-17 NOTE — Progress Notes (Signed)
HPI:   [name] was previously seen in the [facility] due to a personal history of metastatic prostate cancer and concerns regarding a hereditary predisposition to cancer.    [name] recent genetic test results were disclosed to him by telephone. These results and recommendations are discussed in more detail below.  CANCER HISTORY:  Oncology History  Prostate cancer metastatic to bone [facility])  08/2022 Surgery   TURP for BPH: Prostatic adenocarcinoma, Gleason score 4+5=9 (grade group5) involves 80% of the resected tissue.  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    08/2022 Initial Diagnosis   Prostate cancer metastatic to bone (HCC)   09/28/2022 Tumor Marker   PSA 33.5   10/30/2022 Imaging   PSMA PET IMPRESSION: 1. There is diffuse radiotracer uptake throughout the prostate gland compatible with residual/recurrent prostate cancer. 2. Bilateral tracer avid pelvic lymph nodes compatible with nodal metastasis. 3. Multifocal tracer avid bone metastases. 4. Solitary tracer avid pulmonary nodule within the right upper lobe is identified. 5. 5.2 cm infrarenal abdominal aortic aneurysm. Recommend follow-up every 6 months and vascular consultation. This recommendation follows ACR consensus guidelines: White Paper of the ACR Incidental Findings Committee II on Vascular Findings. J Am Coll Radiol 2013; 10:789-794. Aortic aneurysm NOS (ICD10-I71.9). 6.  Aortic Atherosclerosis (ICD10-I70.0).   11/2022 -  Chemotherapy   started Orgovynx and converted to Eligard.   03/02/2023 Tumor Marker   PSA 14.8 testosterone 19.7. alk phos 77   04/09/2023 Genetic Testing   Negative CustomNext-Cancer +RNAinsight Panel.  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  Report date is 04/09/2023.   The CustomNext-Cancer+RNAinsight panel offered by  [facility] includes sequencing, rearrangement, and RNA analysis for the following 47 genes:  APC, ATM, AXIN2, BAP1, BARD1, BMPR1A, BRCA1, BRCA2, BRIP1, CDH1, CDK4, CDKN2A, CHEK2, CTNNA1, DICER1, EPCAM, FH, GREM1, HOXB13, KIT, MEN1, MLH1, MSH2, MSH3, MSH6, MUTYH, NF1, NTHL1, PALB2, PDGFRA, PMS2, POLD1, POLE, PTEN, RAD51C, RAD51D, SDHA, SDHB, SDHC, SDHD, SMAD4, SMARCA4, STK11, TP53, TSC1, TSC2, VHL.       FAMILY HISTORY:  We obtained a detailed, 4-generation family history.  Significant diagnoses are listed below:      Family History  Problem Relation Age of Onset   Leukemia Maternal Aunt          dx >50   Breast cancer Paternal Aunt          dx >50   Stomach cancer Cousin          d. 77; pat male cousin       [name] is unaware of previous family history of genetic testing for hereditary cancer risks.  There is no reported Ashkenazi Jewish ancestry. There is no known consanguinity.   GENETIC TEST RESULTS:  The [facility] CustomNext-Cancer +RNAinsight Panel found no pathogenic mutations.    The CustomNext-Cancer+RNAinsight panel offered by [facility] includes sequencing, rearrangement, and RNA analysis for the following 47 genes:  APC, ATM, AXIN2, BAP1, BARD1, BMPR1A, BRCA1, BRCA2, BRIP1, CDH1, CDK4, CDKN2A, CHEK2, CTNNA1, DICER1, EPCAM, FH, GREM1, HOXB13, KIT, MEN1, MLH1, MSH2, MSH3, MSH6, MUTYH, NF1, NTHL1, PALB2, PDGFRA, PMS2, POLD1, POLE, PTEN, RAD51C, RAD51D, SDHA, SDHB, SDHC, SDHD, SMAD4, SMARCA4, STK11, TP53, TSC1, TSC2, VHL.    The test report has been scanned into EPIC and is located under the Molecular Pathology section of the Results Review tab.  A portion of the result  report is included below for reference. Genetic testing reported out on April 09, 2023.    Genetic testing identified a variant of uncertain significance (VUS) in the TSC1 gene called p.T574S (c.1721C>G).  At this time, it is unknown if this variant is associated with an increased risk for cancer or if  it is benign, but most uncertain variants are reclassified to benign. It should not be used to make medical management decisions. With time, we suspect the laboratory will determine the significance of this variant, if any. If the laboratory reclassifies this variant, we will attempt to contact [name] to discuss it further.   Even though a pathogenic variant was not identified, possible explanations for the cancer in the family may include: There may be no hereditary risk for cancer in the family. The cancers in [name] and/or his family may be sporadic/familial or due to other genetic and environmental factors. There may be a gene mutation in one of these genes that current testing methods cannot detect but that chance is small. There could be another gene that has not yet been discovered, or that we have not yet tested, that is responsible for the cancer diagnoses in the family.  It is also possible there is a hereditary cause for the cancer in the family that [name] did not inherit.   Therefore, it is important to remain in touch with cancer genetics in the future so that we can continue to offer [name] the most up to date genetic testing.    ADDITIONAL GENETIC TESTING:   [name] genetic testing was fairly extensive.  If there are additional relevant genes identified to increase cancer risk that can be analyzed in the future, we would be happy to discuss and coordinate this testing at that time.     CANCER SCREENING RECOMMENDATIONS:  [name] test result is considered negative (normal).  This means that we have not identified a hereditary cause for his personal history of prostate cancer at this time.   An individual's cancer risk and medical management are not determined by genetic test results alone. Overall cancer risk assessment incorporates additional factors, including personal medical history, family history, and any available genetic information that may  result in a personalized plan for cancer prevention and surveillance. Therefore, it is recommended he continue to follow the cancer management and screening guidelines provided by his oncology and primary healthcare provider.   RECOMMENDATIONS FOR FAMILY MEMBERS:   Individuals in this family might be at some increased risk of developing cancer, over the general population risk, due to the family history of cancer.  Individuals in the family should notify their providers of the family history of cancer. We recommend women in this family have a yearly mammogram beginning at age 59, or 62 years younger than the earliest onset of cancer, an annual clinical breast exam, and perform monthly breast self-exams.  Risk models that take into account family history and hormonal history may be helpful in determining appropriate breast cancer screening options for family members. Male relatives should speak with their providers about prostate cancer screening.  We do not recommend familial testing for the TSC1 variant of uncertain significance (VUS).  FOLLOW-UP:  Cancer genetics is a rapidly advancing field and it is possible that new genetic tests will be appropriate for him and/or his family members in the future. We encourage [name] to remain in contact with cancer genetics, so we can update his personal and family histories and let  him know of advances in cancer genetics that may benefit this family.   Our contact number was provided.  They are welcome to call us at anytime with additional questions or concerns.   Shelisa Fern M. Rennie Plowman, MS, [facility] Genetic Counselor [email or web address] (P) [phone]

## 2023-04-17 NOTE — Addendum Note (Signed)
Encounter addended by: Margaretmary Dys, MD on: 04/17/2023 3:52 PM  Actions taken: Problem List reviewed, Medication List reviewed, Allergies reviewed

## 2023-04-20 NOTE — Progress Notes (Addendum)
RN called patient to review additional recommendations after seeing Rad Onc to assess any new needs/questions.   Per wife, patient unavailable.  RN will follow up at a later time.   RN spoke with patient.  No additional questions at this time regarding radiation.  Pt is aware of upcoming appointments starting 10/17.

## 2023-04-22 ENCOUNTER — Encounter (HOSPITAL_COMMUNITY): Payer: Self-pay

## 2023-04-22 ENCOUNTER — Other Ambulatory Visit: Payer: Self-pay

## 2023-04-22 ENCOUNTER — Emergency Department (HOSPITAL_COMMUNITY)
Admission: EM | Admit: 2023-04-22 | Discharge: 2023-04-22 | Disposition: A | Discharge status: Home / Self Care | Payer: Medicare PPO | Attending: Emergency Medicine | Admitting: Emergency Medicine

## 2023-04-22 DIAGNOSIS — T83091A Other mechanical complication of indwelling urethral catheter, initial encounter: Secondary | ICD-10-CM | POA: Diagnosis not present

## 2023-04-22 DIAGNOSIS — R339 Retention of urine, unspecified: Secondary | ICD-10-CM | POA: Insufficient documentation

## 2023-04-22 DIAGNOSIS — Z79899 Other long term (current) drug therapy: Secondary | ICD-10-CM | POA: Insufficient documentation

## 2023-04-22 DIAGNOSIS — I10 Essential (primary) hypertension: Secondary | ICD-10-CM | POA: Diagnosis not present

## 2023-04-22 LAB — URINALYSIS, ROUTINE W REFLEX MICROSCOPIC
Bilirubin Urine: NEGATIVE
Glucose, UA: NEGATIVE mg/dL
Ketones, ur: 5 mg/dL — AB
Nitrite: NEGATIVE
Protein, ur: >=300 mg/dL — AB
Specific Gravity, Urine: 1.016 (ref 1.005–1.030)
WBC, UA: >50 WBC/hpf (ref 0–5)
pH: 8 (ref 5.0–8.0)

## 2023-04-22 MED ORDER — CEFPODOXIME PROXETIL 200 MG PO TABS: 200.0000 mg | ORAL_TABLET | Freq: Two times a day (BID) | ORAL | 0 refills | Status: DC

## 2023-04-22 MED ORDER — CIPROFLOXACIN HCL 500 MG PO TABS
500.0000 mg | ORAL_TABLET | Freq: Once | ORAL | Status: AC
  Administered 2023-04-22: 500 mg via ORAL
  Filled 2023-04-22: qty 1

## 2023-04-22 NOTE — Discharge Instructions (Addendum)
Dear David [name],  It was a pleasure taking care of you at [facility]. You were seen for possible urinary retention and treated for UTI. We are discharging you home now that you are doing better. Please follow the following instructions.  1) PLEASE pick up the prescribed medication from your pharmacy and take it as instructed for 10 days   Take care!  Take care,  Dr. Kathleen Lime, MD

## 2023-04-22 NOTE — ED Notes (Signed)
Pt has greater than 343 ml in his bladder at this time per the bladder scanner.

## 2023-04-22 NOTE — ED Provider Notes (Cosign Needed Addendum)
[place] EMERGENCY DEPARTMENT AT [facility] Provider Note   CSN: [id number] Arrival date & time: 04/22/23  1221     History  Chief Complaint  Patient presents with   Urinary Retention    David [name]. is a 77 y.o. male.  HPI  77 year old male history of UTI, BPH, and hypertension presenting to the ED due to concerns for urinary retention.  Has a Foley placed since October but reports that his Foley has not been draining for the past day, and he feels severe burning sensation at his lower abdomen and groin area. Denies any fevers or chills.   Home Medications Prior to Admission medications   Medication Sig Start Date End Date Taking? Authorizing Provider  acetaminophen (TYLENOL) 325 MG tablet Take 650 mg by mouth every 6 (six) hours as needed (pain and sleep aid).   Yes [provider]  cefpodoxime (VANTIN) 200 MG tablet Take 1 tablet (200 mg total) by mouth 2 (two) times daily. 04/22/23  Yes Kathleen Lime, MD  cetirizine (ZYRTEC) 10 MG tablet Take 1 tablet (10 mg total) by mouth daily. 12/28/22  Yes Fargo, Amy E, NP  finasteride (PROSCAR) 5 MG tablet TAKE 1 TABLET(5 MG) BY MOUTH DAILY 08/21/22  Yes Fargo, Amy E, NP  losartan (COZAAR) 50 MG tablet Take one tablet by mouth daily 04/27/22  Yes Fargo, Amy E, NP  silodosin (RAPAFLO) 8 MG CAPS capsule Take 8 mg by mouth daily with breakfast.   Yes [provider]  simvastatin (ZOCOR) 20 MG tablet TAKE 1 TABLET(20 MG) BY MOUTH DAILY AT 6 PM 09/20/22  Yes Fargo, Amy E, NP  traZODone (DESYREL) 100 MG tablet TAKE 1 TABLET(100 MG) BY MOUTH AT BEDTIME 12/28/22  Yes Fargo, Amy E, NP  enzalutamide (XTANDI) 40 MG tablet Take 160 mg by mouth daily. Patient not taking: Reported on 04/22/2023    [provider]      Allergies    Oxycontin [oxycodone hcl], Penicillins, Celebrex [celecoxib], Erythromycin, and Tetracyclines & related    Review of Systems   Review of Systems  Physical Exam Updated  Vital Signs BP (!) 158/122 (BP Location: Right Arm)   Pulse 70   Temp 97.7 F (36.5 C) (Oral)   Resp 18   Ht 5\' 8"  (1.727 m)   Wt 95.3 kg   SpO2 96%   BMI 31.93 kg/m  Physical Exam Constitutional:      General: He is in acute distress.     Appearance: He is obese. He is not ill-appearing, toxic-appearing or diaphoretic.  Genitourinary:    Penis: Normal.      Comments: Foley in place , draining dark urine  Neurological:     Mental Status: He is alert.     ED Results / Procedures / Treatments   Labs (all labs ordered are listed, but only abnormal results are displayed) Labs Reviewed  URINALYSIS, ROUTINE W REFLEX MICROSCOPIC - Abnormal; Notable for the following components:      Result Value   APPearance CLOUDY (*)    Hgb urine dipstick SMALL (*)    Ketones, ur 5 (*)    Protein, ur >=300 (*)    Leukocytes,Ua SMALL (*)    Bacteria, UA MANY (*)    Non Squamous Epithelial 0-5 (*)    All other components within normal limits  URINE CULTURE    EKG None  Radiology No results found.  Procedures Procedures    Medications Ordered in ED Medications  ciprofloxacin (  CIPRO) tablet 500 mg (500 mg Oral Given 04/22/23 1403)    ED Course/ Medical Decision Making/ A&P                                 Medical Decision Making Amount and/or Complexity of Data Reviewed Labs: ordered.  Risk Prescription drug management.   This patient is a 77 y.o. male who presents to the ED for concern of urinary retention, this involves an extensive number of treatment options, and is a complaint that carries with it a high risk of complications and morbidity. The emergent differential diagnosis prior to evaluation includes, but is not limited to urethral blockage ,UTI  . This is not an exhaustive differential.   Medications: I ordered medication including ciprofloxacin for UTI . Reevaluation of the patient after these medicines showed that the patient improved. I have reviewed the  patients home medicines and have made adjustments as needed.  Consultations Obtained: I requested consultation with Pharmacy for complicated UTI regimen,  and discussed lab and imaging findings as well as pertinent plan - they recommend cefpodoxime 200mg  BID for 10 days    Disposition: After consideration of the diagnostic results and the patients response to treatment, I feel that the emergency department workup does not suggest an emergent condition requiring admission or immediate intervention beyond what has been performed at this time. The plan is discharge with abx. The patient is safe for discharge and has been instructed to return immediately for worsening symptoms, change in symptoms or any other concerns. I discussed this case with my attending physician Dr. Rush Landmark who cosigned this note including patient's presenting symptoms, physical exam, and planned diagnostics and interventions. Attending physician stated agreement with plan or made changes to plan which were implemented.      Final Clinical Impression(s) / ED Diagnoses Final diagnoses:  None    Rx / DC Orders ED Discharge Orders          Ordered    cefpodoxime (VANTIN) 200 MG tablet  2 times daily        04/22/23 1435              Kathleen Lime, MD 04/22/23 1438    Kathleen Lime, MD 04/22/23 1529    Kathleen Lime, MD 04/22/23 1546    Tegeler, Canary Brim, MD 04/25/23 [phone]

## 2023-04-22 NOTE — ED Triage Notes (Signed)
Pt arrived POV from home c/o having issues urinating. Pt states he has had a foley with a leg bad since Oct of last year but since last night nothing has drained out and he feels like he going to explode.

## 2023-04-22 NOTE — ED Notes (Signed)
Pt foley bag was changed to a leg bag for d/c.

## 2023-04-23 DIAGNOSIS — C61 Malignant neoplasm of prostate: Secondary | ICD-10-CM | POA: Diagnosis not present

## 2023-04-23 DIAGNOSIS — C7951 Secondary malignant neoplasm of bone: Secondary | ICD-10-CM | POA: Diagnosis not present

## 2023-04-23 DIAGNOSIS — Z510 Encounter for antineoplastic radiation therapy: Secondary | ICD-10-CM | POA: Diagnosis not present

## 2023-04-25 ENCOUNTER — Ambulatory Visit: Payer: Medicare PPO

## 2023-04-25 LAB — URINE CULTURE: Culture: >=100000 — AB

## 2023-04-26 ENCOUNTER — Telehealth (HOSPITAL_BASED_OUTPATIENT_CLINIC_OR_DEPARTMENT_OTHER): Payer: Self-pay

## 2023-04-26 ENCOUNTER — Ambulatory Visit: Payer: Medicare PPO

## 2023-04-26 ENCOUNTER — Inpatient Hospital Stay: Payer: Medicare PPO

## 2023-04-26 ENCOUNTER — Other Ambulatory Visit: Payer: Self-pay

## 2023-04-26 VITALS — BP 153/91 | HR 98 | Temp 98.0°F | Resp 18 | Wt 208.1 lb

## 2023-04-26 DIAGNOSIS — Z79899 Other long term (current) drug therapy: Secondary | ICD-10-CM | POA: Insufficient documentation

## 2023-04-26 DIAGNOSIS — C7951 Secondary malignant neoplasm of bone: Secondary | ICD-10-CM

## 2023-04-26 DIAGNOSIS — C61 Malignant neoplasm of prostate: Secondary | ICD-10-CM | POA: Diagnosis not present

## 2023-04-26 DIAGNOSIS — I7143 Infrarenal abdominal aortic aneurysm, without rupture: Secondary | ICD-10-CM

## 2023-04-26 DIAGNOSIS — Z87891 Personal history of nicotine dependence: Secondary | ICD-10-CM | POA: Diagnosis not present

## 2023-04-26 DIAGNOSIS — Z9189 Other specified personal risk factors, not elsewhere classified: Secondary | ICD-10-CM | POA: Diagnosis not present

## 2023-04-26 DIAGNOSIS — W57XXXA Bitten or stung by nonvenomous insect and other nonvenomous arthropods, initial encounter: Secondary | ICD-10-CM

## 2023-04-26 LAB — CMP (CANCER CENTER ONLY)
ALT: 11 U/L (ref 0–44)
AST: 15 U/L (ref 15–41)
Albumin: 4.2 g/dL (ref 3.5–5.0)
Alkaline Phosphatase: 92 U/L (ref 38–126)
Anion gap: 9 (ref 5–15)
BUN: 14 mg/dL (ref 8–23)
CO2: 27 mmol/L (ref 22–32)
Calcium: 9.4 mg/dL (ref 8.9–10.3)
Chloride: 104 mmol/L (ref 98–111)
Creatinine: 0.64 mg/dL (ref 0.61–1.24)
GFR, Estimated: >60 mL/min (ref >60)
Glucose, Bld: 175 mg/dL — ABNORMAL HIGH (ref 70–99)
Potassium: 4.3 mmol/L (ref 3.5–5.1)
Sodium: 140 mmol/L (ref 135–145)
Total Bilirubin: 0.4 mg/dL (ref 0.3–1.2)
Total Protein: 8.1 g/dL (ref 6.5–8.1)

## 2023-04-26 LAB — CBC WITH DIFFERENTIAL (CANCER CENTER ONLY)
Abs Immature Granulocytes: 0.02 10*3/uL (ref 0.00–0.07)
Basophils Absolute: 0.1 10*3/uL (ref 0.0–0.1)
Basophils Relative: 1 %
Eosinophils Absolute: 0.1 10*3/uL (ref 0.0–0.5)
Eosinophils Relative: 2 %
HCT: 42.6 % (ref 39.0–52.0)
Hemoglobin: 14.1 g/dL (ref 13.0–17.0)
Immature Granulocytes: 0 %
Lymphocytes Relative: 24 %
Lymphs Abs: 1.2 10*3/uL (ref 0.7–4.0)
MCH: 31.6 pg (ref 26.0–34.0)
MCHC: 33.1 g/dL (ref 30.0–36.0)
MCV: 95.5 fL (ref 80.0–100.0)
Monocytes Absolute: 0.4 10*3/uL (ref 0.1–1.0)
Monocytes Relative: 9 %
Neutro Abs: 3.2 10*3/uL (ref 1.7–7.7)
Neutrophils Relative %: 64 %
Platelet Count: 217 10*3/uL (ref 150–400)
RBC: 4.46 MIL/uL (ref 4.22–5.81)
RDW: 13.2 % (ref 11.5–15.5)
WBC Count: 4.9 10*3/uL (ref 4.0–10.5)
nRBC: 0 % (ref 0.0–0.2)

## 2023-04-26 NOTE — Assessment & Plan Note (Signed)
Supportive baseline bone mineral density study and then every 2 years calcium (1000-1200 mg daily from food and supplements) and vitamin D3 (1000 IU daily) Control and prevent diabetes Weight-bearing exercises (30 minutes per day) Limit alcohol consumption and avoid smoking Monitoring control blood pressure

## 2023-04-26 NOTE — Assessment & Plan Note (Addendum)
He was prescribed enzalutamide and will pick up on 10/24.   Continue ADT and will pick up enzalutamide which Urology had ordered next week. He is on low-dose simvastatin, will monitor for any toxicity. Return to see me with PSA, testosterone, CMP and CBC in 4-5 weeks. Radiation to the L femur to be started but patient reports bed bugs. I contact [facility] to let them know.

## 2023-04-26 NOTE — Progress Notes (Signed)
Patient Care Team: Octavia Heir, NP as PCP - General (Adult Health Nurse Practitioner) Antony Contras, MD as Consulting Physician (Ophthalmology) Octavia Heir, NP as Nurse Practitioner (Adult Health Nurse Practitioner) Cherlyn Cushing, RN as Oncology Nurse Navigator  Clinic Day:  04/26/2023  Referring physician: Octavia Heir, NP  ASSESSMENT & PLAN:   Assessment & Plan:  David [name] is a 77 y.o. man with history of hypertension, hyperlipidemia here for follow-up prostate cancer  Current Diagnosis: mHSPC Initial diagnosis: 08/2022. mHSPC with lymph node, bone, lung metastases. Current Treatment: ADT, darolutamide. Genetic testing for metastatic prostate cancer: neg. 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   Somatic testing, no actionable mutation.  Variant of SF3B1, TP53, CTNNB1. pMMR. TMB 3.7. No HRR mutation.  We discussed the results from hereditary and somatic testing did not show HRR or actionable mutation.  Prostate cancer metastatic to bone [facility]) He was prescribed enzalutamide and able to start on   Continue ADT and will pick up enzalutamide which Urology had ordered next week. He is on low-dose simvastatin, will monitor for any toxicity. Return to see me with PSA, testosterone, CMP and CBC in 4-5 weeks. Radiation to the L femur to be started but patient reports bed bugs. I contact [facility] to let them know.  At risk for side effect of medication Supportive baseline bone mineral density study and then every 2 years calcium (1000-1200 mg daily from food and supplements) and vitamin D3 (1000 IU daily) Control and prevent diabetes Weight-bearing exercises (30 minutes per day) Limit alcohol consumption and avoid smoking Monitoring control blood pressure  Abdominal aortic aneurysm (AAA) (HCC) Obtain US follow up with vascular surgery regarding risk of future chemotherapy usage  The patient understands the plans discussed today and is in  agreement with them.  He knows to contact our office if he develops concerns prior to his next appointment. Recommend referral to social worker to help with financial difficulties.  Melven Sartorius, MD  [facility] [address] [place] [address] Dept: [phone] Dept Fax: [phone]   Orders Placed This Encounter  Procedures   Ambulatory referral to Social Work    Referral Priority:   Routine    Referral Type:   Consultation    Referral Reason:   Specialty Services Required    Number of Visits Requested:   1      CHIEF COMPLAINT:  CC: prostate caner  Current Treatment: Orgovyx with Diana Eves  INTERVAL HISTORY:  David [name] is here today for repeat clinical assessment. He denies any new symptoms.  Recently he was unable to urinate and presented to the ED for evaluation.  He was given antibiotics.  He is feeling better.  He denies significant bone pain, hip pain.  Overall his performance status is still pretty good.  He does not feel his prostate cancer is causing much symptoms.   I have reviewed the past medical history, past surgical history, social history and family history with the patient and they are unchanged from previous note.  ALLERGIES:  is allergic to oxycontin [oxycodone hcl], penicillins, celebrex [celecoxib], erythromycin, and tetracyclines & related.  MEDICATIONS:  Current Outpatient Medications  Medication Sig Dispense Refill   acetaminophen (TYLENOL) 325 MG tablet Take 650 mg by mouth every 6 (six) hours as needed (pain and sleep aid).     cefpodoxime (VANTIN) 200 MG tablet Take 1 tablet (200 mg total) by  mouth 2 (two) times daily. 20 tablet 0   cetirizine (ZYRTEC) 10 MG tablet Take 1 tablet (10 mg total) by mouth daily. 30 tablet 11   finasteride (PROSCAR) 5 MG tablet TAKE 1 TABLET(5 MG) BY MOUTH DAILY 90 tablet 3   losartan (COZAAR) 50 MG tablet Take one tablet by mouth daily 90 tablet  3   silodosin (RAPAFLO) 8 MG CAPS capsule Take 8 mg by mouth daily with breakfast.     simvastatin (ZOCOR) 20 MG tablet TAKE 1 TABLET(20 MG) BY MOUTH DAILY AT 6 PM 90 tablet 1   traZODone (DESYREL) 100 MG tablet TAKE 1 TABLET(100 MG) BY MOUTH AT BEDTIME 90 tablet 1   enzalutamide (XTANDI) 40 MG tablet Take 160 mg by mouth daily.     No current facility-administered medications for this visit.    HISTORY OF PRESENT ILLNESS:   Oncology History  Prostate cancer metastatic to bone [facility])  08/2022 Surgery   TURP for BPH: Prostatic adenocarcinoma, Gleason score 4+5=9 (grade group5) involves 80% of the resected tissue.  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    08/2022 Initial Diagnosis   Prostate cancer metastatic to bone (HCC)   09/28/2022 Tumor Marker   PSA 33.5   10/30/2022 Imaging   PSMA PET IMPRESSION: 1. There is diffuse radiotracer uptake throughout the prostate gland compatible with residual/recurrent prostate cancer. 2. Bilateral tracer avid pelvic lymph nodes compatible with nodal metastasis. 3. Multifocal tracer avid bone metastases. 4. Solitary tracer avid pulmonary nodule within the right upper lobe is identified. 5. 5.2 cm infrarenal abdominal aortic aneurysm. Recommend follow-up every 6 months and vascular consultation. This recommendation follows ACR consensus guidelines: White Paper of the ACR Incidental Findings Committee II on Vascular Findings. J Am Coll Radiol 2013; 10:789-794. Aortic aneurysm NOS (ICD10-I71.9). 6.  Aortic Atherosclerosis (ICD10-I70.0).   11/2022 -  Chemotherapy   started Orgovynx and converted to Eligard.   03/02/2023 Tumor Marker   PSA 14.8 testosterone 19.7. alk phos 77   04/09/2023 Genetic Testing   Negative CustomNext-Cancer +RNAinsight Panel.  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  Report date is  04/09/2023.   The CustomNext-Cancer+RNAinsight panel offered by [facility] includes sequencing, rearrangement, and RNA analysis for the following 47 genes:  APC, ATM, AXIN2, BAP1, BARD1, BMPR1A, BRCA1, BRCA2, BRIP1, CDH1, CDK4, CDKN2A, CHEK2, CTNNA1, DICER1, EPCAM, FH, GREM1, HOXB13, KIT, MEN1, MLH1, MSH2, MSH3, MSH6, MUTYH, NF1, NTHL1, PALB2, PDGFRA, PMS2, POLD1, POLE, PTEN, RAD51C, RAD51D, SDHA, SDHB, SDHC, SDHD, SMAD4, SMARCA4, STK11, TP53, TSC1, TSC2, VHL.       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    Elevated PSA    Flatulence, eructation, and gas pain    Headache(784.0)    Hemorrhoids, external    Herpes zoster with unspecified complication    Hyperpotassemia    Insomnia, unspecified    Lumbago    Lumbago    Myalgia and myositis, unspecified    Nocturia    Obesity, unspecified    Osteoarthrosis, unspecified whether generalized or localized,  unspecified site    Other abnormal blood chemistry    Other headache syndromes(339.89)    Pre-diabetes    Pure hyperglyceridemia    Rash and other nonspecific skin eruption    Rotator cuff (capsule) sprain    Routine general medical examination at a health care facility    Tobacco use disorder    Unspecified essential hypertension    Unspecified hypertrophic and atrophic condition of skin    Unspecified sleep apnea      REVIEW OF SYSTEMS:   Constitutional: Denies fevers, chills or abnormal weight loss Eyes: Denies blurriness of vision Ears, nose, mouth, throat, and face: Denies mucositis or sore throat Respiratory: Denies cough, dyspnea or wheezes Cardiovascular: Denies palpitation, chest  discomfort or lower extremity swelling Gastrointestinal:  Denies nausea, heartburn or change in bowel habits Skin: Denies abnormal skin rashes Lymphatics: Denies new lymphadenopathy or easy bruising Neurological:Denies numbness, tingling or new weaknesses Behavioral/Psych: Mood is stable, no new changes  All other systems were reviewed with the patient and are negative.   VITALS:  Blood pressure (!) 153/91, pulse 98, temperature 98 F (36.7 C), resp. rate 18, weight 208 lb 1.6 oz (94.4 kg), SpO2 98%.  Wt Readings from Last 3 Encounters:  04/26/23 208 lb 1.6 oz (94.4 kg)  04/22/23 210 lb (95.3 kg)  04/16/23 207 lb 8 oz (94.1 kg)    Body mass index is 31.64 kg/m.  Performance status (ECOG): 1 - Symptomatic but completely ambulatory  PHYSICAL EXAM:   GENERAL:alert, no distress and comfortable SKIN: skin color normal, bilateral arm rashes   LABORATORY DATA:  I have reviewed the data as listed    Component Value Date/Time   NA 140 04/26/2023 1002   NA 138 11/03/2015 0835   K 4.3 04/26/2023 1002   CL 104 04/26/2023 1002   CO2 27 04/26/2023 1002   GLUCOSE 175 (H) 04/26/2023 1002   BUN 14 04/26/2023 1002   BUN 16 11/03/2015 0835   CREATININE 0.64 04/26/2023 1002   CREATININE 0.71 12/25/2022 0825   CALCIUM 9.4 04/26/2023 1002   PROT 8.1 04/26/2023 1002   PROT 7.1 11/03/2015 0835   ALBUMIN 4.2 04/26/2023 1002   ALBUMIN 4.4 11/03/2015 0835   AST 15 04/26/2023 1002   ALT 11 04/26/2023 1002   ALKPHOS 92 04/26/2023 1002   BILITOT 0.4 04/26/2023 1002   GFRNONAA >60 04/26/2023 1002   GFRNONAA 87 11/12/2020 1158   GFRAA 101 11/12/2020 1158    No results found for: "SPEP", "UPEP"  Lab Results  Component Value Date   WBC 4.9 04/26/2023   NEUTROABS 3.2 04/26/2023   HGB 14.1 04/26/2023   HCT 42.6 04/26/2023   MCV 95.5 04/26/2023   PLT 217 04/26/2023      Chemistry      Component Value Date/Time   NA 140 04/26/2023 1002   NA 138 11/03/2015 0835   K 4.3 04/26/2023  1002   CL 104 04/26/2023 1002   CO2 27 04/26/2023 1002   BUN 14 04/26/2023 1002   BUN 16 11/03/2015 0835   CREATININE 0.64 04/26/2023 1002   CREATININE 0.71 12/25/2022 0825      Component Value Date/Time   CALCIUM 9.4 04/26/2023 1002   ALKPHOS 92 04/26/2023 1002   AST 15 04/26/2023 1002   ALT 11 04/26/2023 1002   BILITOT 0.4 04/26/2023 1002       RADIOGRAPHIC STUDIES: I have personally reviewed the radiological images as listed and agreed with the findings in the report. DG BONE  DENSITY (DXA)  Result Date: 04/06/2023 EXAM: DUAL X-RAY ABSORPTIOMETRY (DXA) FOR BONE MINERAL DENSITY IMPRESSION: Referring Physician:  Melven Sartorius Your patient completed a bone mineral density test using GE Lunar iDXA system (analysis version: 16). Technologist: CAM PATIENT: Name: David [name], David [name] Patient ID: [id number] Birth Date: April 04, 1946 Height: 70.0 in. Sex: Male Measured: 04/06/2023 Weight: 208.2 lbs. Indications: Advanced Age, Caucasian, Prostate Cancer Fractures: Ankle Treatments: Prolia ASSESSMENT: The BMD measured at Right Femur Neck is 0.791 g/cm2 with a T-score of -1.8. This patient is considered to have osteopenia/low bone mass according to World Health Organization [facility]) criteria. FRAX was not calclated due to: on BBM. Scan quality is good. Exclusions: Left femur and lumbar spine due to degenerative changes. Site Region Measured Date Measured Age YA BMD Significant CHANGE T-score Right Femur Neck 04/06/2023 77.7 -1.8 0.791 g/cm2 Left Forearm Radius 33% 04[age]06/2023 77.7 0.3 0.900 g/cm2 World Health Organization [facility]) criteria for post-menopausal, Caucasian Women: Normal       T-score at or above -1 SD Osteopenia   T-score between -1 and -2.5 SD Osteoporosis T-score at or below -2.5 SD RECOMMENDATION: 1. All patients should optimize calcium and vitamin D intake. 2. Consider FDA approved medical therapies in postmenopausal women and men aged 20 years and older, based on the following: a. A hip or  vertebral (clinical or morphometric) fracture b. T-score = -2.5 at the femoral neck or spine after appropriate evaluation to exclude secondary causes c. Low bone mass (T-score between -1.0 and -2.5 at the femoral neck or spine) and a 10- year probability of a hip fracture = 3% or a 10 year probability of a major osteoporosis-related fracture = 20% based on the [place]-adapted WHO algorithm. 3. Clinician judgement and/or patient preference may indicate treatment for people with10-year fracture probabilities above or below these levels. (Sample) All patients should ensure an adequate intake of dietary calcium and vitamin D. The NOF recommends adults under age 44 need 1,000 mg of calcium and 400-800 IU of vitamin D daily. Adults 50 and over need 1,200 mg of calcium and 800-1,000 IU of vitamin D daily. FOLLOW-UP: Patients with diagnosis of osteoporosis or at high risk for fracture should have regular bone mineral density tests . For patients eligible for Medicare routine testing is allowed once every 2 years. The testing frequency can be increased to one year for patients who have rapidly progressing disease, those who are receiving or discontinuing medical therapy to restore bone mass, or have additional risk factors. (Sample) People with diagnosed cases of osteoporosis or at high risk for fracture should have regular bone mineral density tests. For patients eligible for Medicare, routine testing is allowed once every 2 years. The testing frequency can be increased to one year for patients who have rapidly progressing disease, those who are receiving or discontinuing medical therapy to restore bone mass, or have additional risk factors. Patients with diagnosis of osteoporosis or at high risk for fracture should have regular bone mineral density tests. For patients eligible for Medicare routine testing is allowed once every 2 years. The testing frequency can be increased to one year for patients who have rapidly progressing  disease, those who are receiving or discontinuing medical therapy to restore bone mass, or have additional risk factors. I have reviewed this study and agree with the findings. [facility] Electronically Signed   By: Romona Curls M.D.   On: 04/06/2023 10:45

## 2023-04-26 NOTE — Progress Notes (Signed)
Rad Onc notified of recent bed bug encounter and they will work to reschedule patient to start treatment on Monday to allow for treatment at end of day.   RN placed referral to social worker as well to assess any resources to assist patient.

## 2023-04-26 NOTE — Assessment & Plan Note (Signed)
Obtain US follow up with vascular surgery regarding risk of future chemotherapy usage

## 2023-04-26 NOTE — Telephone Encounter (Signed)
Post ED Visit - Positive Culture Follow-up: Unsuccessful Patient Follow-up  Culture assessed and recommendations reviewed by:  []  Enzo Bi, Pharm.D. []  Celedonio Miyamoto, [address].D., BCPS AQ-ID []  Garvin Fila, Pharm.D., BCPS [x]  Wilburn Cornelia, [address].D., BCPS []  Roy, [address].D., BCPS, AAHIVP []  Estella Husk, Pharm.D., BCPS, AAHIVP []  Sherlynn Carbon, PharmD []  Pollyann Samples, PharmD, BCPS  Positive urine culture  []  Patient discharged without antimicrobial prescription and treatment is now indicated [x]  Organism is resistant to prescribed ED discharge antimicrobial []  Patient with positive blood cultures  Plan: Stop Cefpodoxime and start Ciprofloxacin 500 mg po BID x 10 days per ED provider Gwyneth Sprout, MD  Unable to contact patient after 3 attempts, letter will be sent to address on file  Sandria Senter 04/26/2023, 1:16 PM

## 2023-04-26 NOTE — Progress Notes (Addendum)
ED Antimicrobial Stewardship Positive Culture Follow Up   David Dhondt. is an 77 y.o. male who presented to [facility] on 04/22/2023 with a chief complaint of  Chief Complaint  Patient presents with   Urinary Retention    Recent Results (from the past 720 hour(s))  Urine Culture     Status: Abnormal   Collection Time: 04/22/23  2:16 PM   Specimen: Urine, Clean Catch  Result Value Ref Range Status   Specimen Description URINE, CLEAN CATCH  Final   Special Requests   Final    NONE Performed at [facility], [address], [place], [place] [address]    Culture >=100,000 COLONIES/mL PROTEUS PENNERI (A)  Final   Report Status 04/25/2023 FINAL  Final   Organism ID, Bacteria PROTEUS PENNERI (A)  Final      Susceptibility   Proteus penneri - MIC*    AMPICILLIN >=32 RESISTANT Resistant     CEFEPIME <=0.12 SENSITIVE Sensitive     CEFTRIAXONE 32 RESISTANT Resistant     CIPROFLOXACIN <=0.25 SENSITIVE Sensitive     GENTAMICIN <=1 SENSITIVE Sensitive     IMIPENEM 2 SENSITIVE Sensitive     NITROFURANTOIN RESISTANT Resistant     TRIMETH/SULFA <=20 SENSITIVE Sensitive     AMPICILLIN/SULBACTAM >=32 RESISTANT Resistant     PIP/TAZO <=4 SENSITIVE Sensitive ug/mL    * >=100,000 COLONIES/mL PROTEUS PENNERI    [x]  Treated with cefpodoxime, organism not susceptile to prescribed antimicrobial  New antibiotic prescription: Ciprofloxacin 500mg  po BID x 10 days  ED Provider: Dr. Smith Robert, PharmD, BCPS 04/26/2023, 11:53 AM Clinical Pharmacist Monday - Friday phone -  [phone] Saturday - Sunday phone - [phone]

## 2023-04-27 ENCOUNTER — Inpatient Hospital Stay: Payer: Medicare PPO

## 2023-04-27 ENCOUNTER — Ambulatory Visit: Payer: Medicare PPO

## 2023-04-27 LAB — PSA, TOTAL AND FREE
PSA, Free Pct: 8.4 %
PSA, Free: 1.66 ng/mL
Prostate Specific Ag, Serum: 19.7 ng/mL — ABNORMAL HIGH (ref 0.0–4.0)

## 2023-04-27 LAB — TESTOSTERONE: Testosterone: 18 ng/dL — ABNORMAL LOW (ref 264–916)

## 2023-04-27 NOTE — Progress Notes (Signed)
[facility] Clinical Social Work  Clinical Social Work was referred by Statistician due to reported and documented concerns with bed bugs.  Clinical Social Worker contacted patient by phone to assess for needs and explore reported concern. Patient owns home, reported source of pest to be sister, no pets, and limited income to pay for treatment.  Patient has been instructed to bring in financial documentation for proof of income to see if he qualifies for assistance with Bedbug treatment through the CC.   Marguerita Merles, LCSWA  Clinical Social Worker [facility]

## 2023-04-30 ENCOUNTER — Other Ambulatory Visit: Payer: Medicare PPO

## 2023-04-30 ENCOUNTER — Ambulatory Visit: Admission: RE | Admit: 2023-04-30 | Payer: Medicare PPO | Source: Ambulatory Visit

## 2023-05-01 ENCOUNTER — Telehealth: Payer: Self-pay

## 2023-05-01 ENCOUNTER — Ambulatory Visit: Payer: Medicare PPO

## 2023-05-01 NOTE — Telephone Encounter (Signed)
[facility] CSW Progress Note  Clinical Child psychotherapist  contacted patient   to follow up on documents requested for funds to asssit with bed bug treatment.Patient stated that he was unable to locate any documentation in his home (Tax Documents or Award Letters). CSW reminded patient that the checking statement with SSI deposits are also appropriate for proof of income. Patient verbalized understanding.  Marguerita Merles, LCSWA Clinical Social Worker [facility]

## 2023-05-01 NOTE — Telephone Encounter (Signed)
TC to patient to follow up about a financial assistance form for [employer] that was left for him to sign in radiation therapy yesterday prior to his appt. Pt states that he missed his radiation appt yesterday because he forgot about it. He verbalizes frustration regarding the precautions that are needed due to his bedbug infestation. He reports that no one has treated his home yet, stating that he first must provide proof of his income. He asks questions about how he is supposed to enter the facility to have his radiation treatment tomorrow afternoon. Informed that I would have someone call him about this. Discussed situation w/ Cherlyn Cushing, Nurse Navigator and [place], [place], who state they will follow up with pt this afternoon. Discussed [employer] financial aid sheet w/ pt and instructed him to obtain this from radiation therapy receptionist tomorrow and sign it so that it can be faxed to [employer] before he gets billed for testing that has already been done. Pt verbalizes understanding.

## 2023-05-02 ENCOUNTER — Other Ambulatory Visit: Payer: Self-pay

## 2023-05-02 ENCOUNTER — Ambulatory Visit
Admission: RE | Admit: 2023-05-02 | Discharge: 2023-05-02 | Discharge status: Home / Self Care | Payer: Medicare PPO | Source: Ambulatory Visit | Attending: Radiation Oncology

## 2023-05-02 DIAGNOSIS — Z23 Encounter for immunization: Secondary | ICD-10-CM | POA: Diagnosis not present

## 2023-05-02 DIAGNOSIS — Z510 Encounter for antineoplastic radiation therapy: Secondary | ICD-10-CM | POA: Diagnosis not present

## 2023-05-02 DIAGNOSIS — C7951 Secondary malignant neoplasm of bone: Secondary | ICD-10-CM | POA: Diagnosis not present

## 2023-05-02 DIAGNOSIS — C61 Malignant neoplasm of prostate: Secondary | ICD-10-CM | POA: Diagnosis not present

## 2023-05-02 DIAGNOSIS — R339 Retention of urine, unspecified: Secondary | ICD-10-CM | POA: Diagnosis not present

## 2023-05-02 DIAGNOSIS — I7143 Infrarenal abdominal aortic aneurysm, without rupture: Secondary | ICD-10-CM | POA: Diagnosis not present

## 2023-05-02 DIAGNOSIS — W57XXXA Bitten or stung by nonvenomous insect and other nonvenomous arthropods, initial encounter: Secondary | ICD-10-CM | POA: Diagnosis not present

## 2023-05-02 DIAGNOSIS — M85851 Other specified disorders of bone density and structure, right thigh: Secondary | ICD-10-CM | POA: Diagnosis not present

## 2023-05-02 DIAGNOSIS — I10 Essential (primary) hypertension: Secondary | ICD-10-CM | POA: Diagnosis not present

## 2023-05-02 LAB — RAD ONC ARIA SESSION SUMMARY
Course Elapsed Days: 0
Plan Fractions Treated to Date: 1
Plan Prescribed Dose Per Fraction: 3 Gy
Plan Total Fractions Prescribed: 10
Plan Total Prescribed Dose: 30 Gy
Reference Point Dosage Given to Date: 3 Gy
Reference Point Session Dosage Given: 3 Gy
Session Number: 1

## 2023-05-03 ENCOUNTER — Encounter: Payer: Self-pay | Admitting: Orthopedic Surgery

## 2023-05-03 ENCOUNTER — Other Ambulatory Visit: Payer: Self-pay

## 2023-05-03 ENCOUNTER — Ambulatory Visit: Payer: Medicare PPO | Admitting: Orthopedic Surgery

## 2023-05-03 ENCOUNTER — Telehealth (HOSPITAL_BASED_OUTPATIENT_CLINIC_OR_DEPARTMENT_OTHER): Payer: Self-pay

## 2023-05-03 ENCOUNTER — Ambulatory Visit
Admission: RE | Admit: 2023-05-03 | Discharge: 2023-05-03 | Disposition: A | Discharge status: Home / Self Care | Payer: Medicare PPO | Source: Ambulatory Visit | Attending: Radiation Oncology | Admitting: Radiation Oncology

## 2023-05-03 VITALS — BP 138/88 | HR 61 | Temp 98.1°F | Ht 68.0 in | Wt 212.6 lb

## 2023-05-03 DIAGNOSIS — Z23 Encounter for immunization: Secondary | ICD-10-CM | POA: Diagnosis not present

## 2023-05-03 DIAGNOSIS — C61 Malignant neoplasm of prostate: Secondary | ICD-10-CM

## 2023-05-03 DIAGNOSIS — I10 Essential (primary) hypertension: Secondary | ICD-10-CM

## 2023-05-03 DIAGNOSIS — C7951 Secondary malignant neoplasm of bone: Secondary | ICD-10-CM | POA: Diagnosis not present

## 2023-05-03 DIAGNOSIS — M85851 Other specified disorders of bone density and structure, right thigh: Secondary | ICD-10-CM | POA: Diagnosis not present

## 2023-05-03 DIAGNOSIS — I7143 Infrarenal abdominal aortic aneurysm, without rupture: Secondary | ICD-10-CM

## 2023-05-03 DIAGNOSIS — R338 Other retention of urine: Secondary | ICD-10-CM | POA: Diagnosis not present

## 2023-05-03 DIAGNOSIS — Z510 Encounter for antineoplastic radiation therapy: Secondary | ICD-10-CM | POA: Diagnosis not present

## 2023-05-03 DIAGNOSIS — W57XXXA Bitten or stung by nonvenomous insect and other nonvenomous arthropods, initial encounter: Secondary | ICD-10-CM

## 2023-05-03 DIAGNOSIS — R339 Retention of urine, unspecified: Secondary | ICD-10-CM | POA: Diagnosis not present

## 2023-05-03 LAB — RAD ONC ARIA SESSION SUMMARY
Course Elapsed Days: 1
Plan Fractions Treated to Date: 2
Plan Prescribed Dose Per Fraction: 3 Gy
Plan Total Fractions Prescribed: 10
Plan Total Prescribed Dose: 30 Gy
Reference Point Dosage Given to Date: 6 Gy
Reference Point Session Dosage Given: 3 Gy
Session Number: 2

## 2023-05-03 MED ORDER — TRIAMCINOLONE ACETONIDE 0.1 % EX CREA
1.0000 | TOPICAL_CREAM | Freq: Two times a day (BID) | CUTANEOUS | 4 refills | Status: DC
Start: 2023-05-03 — End: 2024-03-21

## 2023-05-03 NOTE — Telephone Encounter (Signed)
Post ED Visit - Positive Culture Follow-up: Successful Patient Follow-Up  Culture assessed and recommendations reviewed by:  [x]  Wilburn Cornelia, Pharm.D. []  Celedonio Miyamoto, [address]D., BCPS AQ-ID []  Garvin Fila, Pharm.D., BCPS []  Georgina Pillion, Pharm.D., BCPS []  Captain Cook, [address].D., BCPS, AAHIVP []  Estella Husk, Pharm.D., BCPS, AAHIVP []  Lysle Pearl, PharmD, BCPS []  Phillips Climes, PharmD, BCPS []  Agapito Games, PharmD, BCPS []  Verlan Friends, PharmD  Positive urine culture  []  Patient discharged without antimicrobial prescription and treatment is now indicated [x]  Organism is resistant to prescribed ED discharge antimicrobial []  Patient with positive blood cultures  Changes discussed with ED provider: Gwyneth Sprout, MD New antibiotic prescription Ciprofloxacin 500 mg po BID x 10 days Called to [facility] [employer] called back and received information, new abx called in.  Contacted patient, date 05/03/23, time 12:35 pm   Sandria Senter 05/03/2023, 12:38 PM

## 2023-05-03 NOTE — Progress Notes (Signed)
Careteam: Patient Care Team: Octavia Heir, NP as PCP - General (Adult Health Nurse Practitioner) Antony Contras, MD as Consulting Physician (Ophthalmology) Octavia Heir, NP as Nurse Practitioner (Adult Health Nurse Practitioner) Cherlyn Cushing, RN as Oncology Nurse Navigator  Seen by: Hazle Nordmann, AGNP-C  PLACE OF SERVICE:  [facility]  Advanced Directive information    Allergies  Allergen Reactions   Oxycontin [Oxycodone Hcl] Hives and Shortness Of Breath   Penicillins Hives and Shortness Of Breath   Celebrex [Celecoxib]    Erythromycin    Tetracyclines & Related     No chief complaint on file.    HPI: Patient is a 77 y.o. male seen today for medical management of chronic conditions.   Prostate cancer with mets to bone- followed by oncology and urology, started radiation to left femur yesterday> some claustrophobia> plans to start Xtandi oral chemo soon.   Urinary retention- indwelling foley in place, remains on finasteride and silodosin  AAA- followed by vascular surgery, ultrasound needed  03/2023 DEXA scan indicated osteopenia, he reports starting injections, but not listed in EPIC.   Reports smoking for 52 years. Quit 7 years ago. CT chest ?   Bed bugs> reports having infestation > 6 months. He believes he got them from sisters dogs. He will wake up and starting itching. Rash to right forearm, neck and scalp. He cannot afford pest control. Oncology made referral for social worker> he has not been contacted.   Flu vaccine given today.   Review of Systems:  Review of Systems  Constitutional: Negative.   HENT: Negative.    Eyes: Negative.   Respiratory: Negative.    Cardiovascular: Negative.   Gastrointestinal:  Positive for constipation and diarrhea.  Genitourinary:  Positive for hematuria.       Indwelling foley  Musculoskeletal: Negative.   Skin: Negative.   Neurological: Negative.   Psychiatric/Behavioral: Negative.      Past Medical History:   Diagnosis Date   Allergic rhinitis due to pollen    Anxiety state, unspecified    Asymptomatic varicose veins    Atherosclerosis of native arteries of the extremities, unspecified    Cataract 10/17/2016   L Eye   Contact dermatitis and other eczema due to other chemical products    Diaphragmatic hernia without mention of obstruction or gangrene    Elevated PSA    Flatulence, eructation, and gas pain    Headache(784.0)    Hemorrhoids, external    Herpes zoster with unspecified complication    Hyperpotassemia    Insomnia, unspecified    Lumbago    Lumbago    Myalgia and myositis, unspecified    Nocturia    Obesity, unspecified    Osteoarthrosis, unspecified whether generalized or localized, unspecified site    Other abnormal blood chemistry    Other headache syndromes(339.89)    Pre-diabetes    Pure hyperglyceridemia    Rash and other nonspecific skin eruption    Rotator cuff (capsule) sprain    Routine general medical examination at a health care facility    Tobacco use disorder    Unspecified essential hypertension    Unspecified hypertrophic and atrophic condition of skin    Unspecified sleep apnea    Past Surgical History:  Procedure Laterality Date   CYSTOSCOPY N/A 08/22/2022   Procedure: CYSTOSCOPY;  Surgeon: Belva Agee, MD;  Location: [facility];  Service: Urology;  Laterality: N/A;   DENTAL SURGERY  05/10/2021   Per patient  HEMORRHOID SURGERY     PROSTATE BIOPSY     TRANSURETHRAL RESECTION OF PROSTATE N/A 08/22/2022   Procedure: TRANSURETHRAL RESECTION OF THE PROSTATE (TURP);  Surgeon: Belva Agee, MD;  Location: [facility];  Service: Urology;  Laterality: N/A;   tumorectomy Left    Removed tumor from his chest.   VARICOSE VEIN SURGERY     Social History:   reports that he quit smoking about 7 years ago. His smoking use included cigarettes. He started smoking about 62 years ago. He has a 82.5 pack-year smoking  history. He has never used smokeless tobacco. He reports that he does not drink alcohol and does not use drugs.  Family History  Problem Relation Age of Onset   Diabetes Mother    Heart disease Mother    Stroke Mother    Stroke Father    Leukemia Maternal Aunt        dx >50   Breast cancer Paternal Aunt        dx >50   Stomach cancer Cousin        d. 70; pat male cousin    Medications: Patient's Medications  New Prescriptions   No medications on file  Previous Medications   ACETAMINOPHEN (TYLENOL) 325 MG TABLET    Take 650 mg by mouth every 6 (six) hours as needed (pain and sleep aid).   CEFPODOXIME (VANTIN) 200 MG TABLET    Take 1 tablet (200 mg total) by mouth 2 (two) times daily.   CETIRIZINE (ZYRTEC) 10 MG TABLET    Take 1 tablet (10 mg total) by mouth daily.   ENZALUTAMIDE (XTANDI) 40 MG TABLET    Take 160 mg by mouth daily.   FINASTERIDE (PROSCAR) 5 MG TABLET    TAKE 1 TABLET(5 MG) BY MOUTH DAILY   LOSARTAN (COZAAR) 50 MG TABLET    Take one tablet by mouth daily   SILODOSIN (RAPAFLO) 8 MG CAPS CAPSULE    Take 8 mg by mouth daily with breakfast.   SIMVASTATIN (ZOCOR) 20 MG TABLET    TAKE 1 TABLET(20 MG) BY MOUTH DAILY AT 6 PM   TRAZODONE (DESYREL) 100 MG TABLET    TAKE 1 TABLET(100 MG) BY MOUTH AT BEDTIME  Modified Medications   No medications on file  Discontinued Medications   No medications on file    Physical Exam:  There were no vitals filed for this visit. There is no height or weight on file to calculate BMI. Wt Readings from Last 3 Encounters:  04/26/23 208 lb 1.6 oz (94.4 kg)  04/22/23 210 lb (95.3 kg)  04/16/23 207 lb 8 oz (94.1 kg)    Physical Exam Vitals reviewed.  Constitutional:      General: He is not in acute distress. HENT:     Head: Normocephalic.  Eyes:     General:        Right eye: No discharge.        Left eye: No discharge.  Cardiovascular:     Rate and Rhythm: Normal rate and regular rhythm.     Pulses: Normal pulses.     Heart  sounds: Normal heart sounds.  Pulmonary:     Effort: Pulmonary effort is normal. No respiratory distress.     Breath sounds: Normal breath sounds. No wheezing.  Abdominal:     General: Bowel sounds are normal.     Palpations: Abdomen is soft.  Musculoskeletal:     Cervical back: Neck supple.  Right lower leg: No edema.     Left lower leg: No edema.  Skin:    General: Skin is warm.     Capillary Refill: Capillary refill takes less than 2 seconds.     Findings: Rash present.     Comments: Small clusters of red papular rash to right forearm, neck and scalp. Scalp and neck with linear pattern. No sign of infection.   Neurological:     General: No focal deficit present.     Mental Status: He is alert and oriented to person, place, and time.  Psychiatric:        Mood and Affect: Mood normal.     Labs reviewed: Basic Metabolic Panel: Recent Labs    12/25/22 0825 03/29/23 1248 04/26/23 1002  NA 140 140 140  K 4.2 4.4 4.3  CL 102 105 104  CO2 28 29 27   GLUCOSE 120* 102* 175*  BUN 14 19 14   CREATININE 0.71 0.63 0.64  CALCIUM 9.3 9.8 9.4   Liver Function Tests: Recent Labs    12/25/22 0825 03/29/23 1248 04/26/23 1002  AST 15 17 15   ALT 12 13 11   ALKPHOS  --  94 92  BILITOT 0.4 0.5 0.4  PROT 7.6 8.2* 8.1  ALBUMIN  --  4.4 4.2   No results for input(s): "LIPASE", "AMYLASE" in the last 8760 hours. No results for input(s): "AMMONIA" in the last 8760 hours. CBC: Recent Labs    12/25/22 0825 03/29/23 1248 04/26/23 1002  WBC 4.7 4.8 4.9  NEUTROABS 2,735 2.9 3.2  HGB 13.6 14.2 14.1  HCT 41.8 43.1 42.6  MCV 93.3 95.4 95.5  PLT 276 199 217   Lipid Panel: Recent Labs    08/29/22 0000 12/25/22 0825  CHOL 188 106  HDL 35* 33*  LDLCALC 112* 52  TRIG 287* 129  CHOLHDL 5.4* 3.2   TSH: No results for input(s): "TSH" in the last 8760 hours. A1C: Lab Results  Component Value Date   HGBA1C 6.2 (H) 12/25/2022     Assessment/Plan 1. Prostate cancer  metastatic to bone [facility]) - followed by oncology - first radiation yesterday to left femur - about to start oral chemo Xtandi  2. Essential hypertension - controlled without medication  3. Infrarenal abdominal aortic aneurysm (AAA) without rupture (HCC) - scheduled to see vascular surgery 11/06 - 10/2022 PET scan noted 5.2 cm infrarenal AAA> f/u every 6 months and vascular consult recommended  4. Osteopenia of neck of right femur - noted DEXA 03/2023 - reports having injectable> ? Prolia  5. Encounter for immunization - Flu Vaccine Trivalent High Dose (Fluad)  6. Bedbug bite, initial encounter - papular rash to right forearm, neck and scalp - ongoing > 6 months - unable to afford pest control services - oncology made referral for social worker assistance - triamcinolone cream (KENALOG) 0.1 %; Apply 1 Application topically 2 (two) times daily.  Dispense: 30 g; Refill: 4  7. Urinary retention - ongoing - followed by urology  - cont monthly foley changes    Total time: 32 minutes. Greater than 50% of total time spent doing patient education regarding prostate cancer, osteopenia, urinary retention, and bed bugs including symptom/medication management.     Next appt: Visit date not found  Dodi Leu Scherry Ran  [facility] [phone]

## 2023-05-03 NOTE — Patient Instructions (Addendum)
Vacuuming. A thorough vacuuming of crevices can remove bedbugs from an area but likely won't capture all of them. Empty the vacuum after each use.  Laundering. Washing items in water that's at least 120 F (48.9 C) can kill bedbugs. As will putting them in a dryer on a high setting for 20 minutes.  Placing items in an enclosed vehicle. If you live in a very hot place, bag up the infested items and place them in a car parked in the sun with the windows rolled up for a day. The target temperature is at least 120 F (48.9 C).  In some cases, you may have to throw out heavily infested items such as mattresses or couches. Make it obvious the item is unusable so no one else picks it up and gets bed bugs.  Triamcinolone cream sent to help with itching

## 2023-05-04 ENCOUNTER — Other Ambulatory Visit: Payer: Self-pay

## 2023-05-04 ENCOUNTER — Ambulatory Visit
Admission: RE | Admit: 2023-05-04 | Discharge: 2023-05-04 | Disposition: A | Discharge status: Home / Self Care | Payer: Medicare PPO | Source: Ambulatory Visit | Attending: Radiation Oncology | Admitting: Radiation Oncology

## 2023-05-04 ENCOUNTER — Ambulatory Visit
Admission: RE | Admit: 2023-05-04 | Discharge: 2023-05-04 | Disposition: A | Discharge status: Home / Self Care | Payer: Medicare PPO | Source: Ambulatory Visit | Attending: Radiation Oncology

## 2023-05-04 DIAGNOSIS — Z510 Encounter for antineoplastic radiation therapy: Secondary | ICD-10-CM | POA: Diagnosis not present

## 2023-05-04 DIAGNOSIS — C7951 Secondary malignant neoplasm of bone: Secondary | ICD-10-CM | POA: Diagnosis not present

## 2023-05-04 DIAGNOSIS — C61 Malignant neoplasm of prostate: Secondary | ICD-10-CM | POA: Diagnosis not present

## 2023-05-04 LAB — RAD ONC ARIA SESSION SUMMARY
Course Elapsed Days: 2
Plan Fractions Treated to Date: 3
Plan Prescribed Dose Per Fraction: 3 Gy
Plan Total Fractions Prescribed: 10
Plan Total Prescribed Dose: 30 Gy
Reference Point Dosage Given to Date: 9 Gy
Reference Point Session Dosage Given: 3 Gy
Session Number: 3

## 2023-05-07 ENCOUNTER — Ambulatory Visit
Admission: RE | Admit: 2023-05-07 | Discharge: 2023-05-07 | Disposition: A | Discharge status: Home / Self Care | Payer: Medicare PPO | Source: Ambulatory Visit | Attending: Radiation Oncology

## 2023-05-07 ENCOUNTER — Other Ambulatory Visit: Payer: Self-pay

## 2023-05-07 DIAGNOSIS — C61 Malignant neoplasm of prostate: Secondary | ICD-10-CM | POA: Diagnosis not present

## 2023-05-07 DIAGNOSIS — Z510 Encounter for antineoplastic radiation therapy: Secondary | ICD-10-CM | POA: Diagnosis not present

## 2023-05-07 DIAGNOSIS — C7951 Secondary malignant neoplasm of bone: Secondary | ICD-10-CM | POA: Diagnosis not present

## 2023-05-07 LAB — RAD ONC ARIA SESSION SUMMARY
Course Elapsed Days: 5
Plan Fractions Treated to Date: 4
Plan Prescribed Dose Per Fraction: 3 Gy
Plan Total Fractions Prescribed: 10
Plan Total Prescribed Dose: 30 Gy
Reference Point Dosage Given to Date: 12 Gy
Reference Point Session Dosage Given: 3 Gy
Session Number: 4

## 2023-05-08 ENCOUNTER — Ambulatory Visit: Payer: Medicare PPO

## 2023-05-08 ENCOUNTER — Other Ambulatory Visit: Payer: Self-pay | Admitting: *Deleted

## 2023-05-08 DIAGNOSIS — I714 Abdominal aortic aneurysm, without rupture, unspecified: Secondary | ICD-10-CM

## 2023-05-09 ENCOUNTER — Ambulatory Visit: Payer: Medicare PPO

## 2023-05-09 ENCOUNTER — Other Ambulatory Visit: Payer: Self-pay

## 2023-05-09 ENCOUNTER — Ambulatory Visit
Admission: RE | Admit: 2023-05-09 | Discharge: 2023-05-09 | Disposition: A | Discharge status: Home / Self Care | Payer: Medicare PPO | Source: Ambulatory Visit | Attending: Radiation Oncology | Admitting: Radiation Oncology

## 2023-05-09 DIAGNOSIS — C61 Malignant neoplasm of prostate: Secondary | ICD-10-CM | POA: Diagnosis not present

## 2023-05-09 DIAGNOSIS — C7951 Secondary malignant neoplasm of bone: Secondary | ICD-10-CM | POA: Diagnosis not present

## 2023-05-09 DIAGNOSIS — Z510 Encounter for antineoplastic radiation therapy: Secondary | ICD-10-CM | POA: Diagnosis not present

## 2023-05-09 LAB — RAD ONC ARIA SESSION SUMMARY
Course Elapsed Days: 7
Plan Fractions Treated to Date: 5
Plan Prescribed Dose Per Fraction: 3 Gy
Plan Total Fractions Prescribed: 10
Plan Total Prescribed Dose: 30 Gy
Reference Point Dosage Given to Date: 15 Gy
Reference Point Session Dosage Given: 3 Gy
Session Number: 5

## 2023-05-10 ENCOUNTER — Telehealth: Payer: Self-pay

## 2023-05-10 ENCOUNTER — Ambulatory Visit: Payer: Medicare PPO

## 2023-05-10 ENCOUNTER — Other Ambulatory Visit: Payer: Self-pay

## 2023-05-10 ENCOUNTER — Ambulatory Visit
Admission: RE | Admit: 2023-05-10 | Discharge: 2023-05-10 | Disposition: A | Discharge status: Home / Self Care | Payer: Medicare PPO | Source: Ambulatory Visit | Attending: Radiation Oncology | Admitting: Radiation Oncology

## 2023-05-10 DIAGNOSIS — C7951 Secondary malignant neoplasm of bone: Secondary | ICD-10-CM | POA: Diagnosis not present

## 2023-05-10 DIAGNOSIS — J309 Allergic rhinitis, unspecified: Secondary | ICD-10-CM | POA: Diagnosis not present

## 2023-05-10 DIAGNOSIS — C61 Malignant neoplasm of prostate: Secondary | ICD-10-CM | POA: Diagnosis not present

## 2023-05-10 DIAGNOSIS — H1045 Other chronic allergic conjunctivitis: Secondary | ICD-10-CM | POA: Diagnosis not present

## 2023-05-10 DIAGNOSIS — R21 Rash and other nonspecific skin eruption: Secondary | ICD-10-CM | POA: Diagnosis not present

## 2023-05-10 DIAGNOSIS — R634 Abnormal weight loss: Secondary | ICD-10-CM | POA: Diagnosis not present

## 2023-05-10 DIAGNOSIS — Z510 Encounter for antineoplastic radiation therapy: Secondary | ICD-10-CM | POA: Diagnosis not present

## 2023-05-10 LAB — RAD ONC ARIA SESSION SUMMARY
Course Elapsed Days: 8
Plan Fractions Treated to Date: 6
Plan Prescribed Dose Per Fraction: 3 Gy
Plan Total Fractions Prescribed: 10
Plan Total Prescribed Dose: 30 Gy
Reference Point Dosage Given to Date: 18 Gy
Reference Point Session Dosage Given: 3 Gy
Session Number: 6

## 2023-05-10 NOTE — Telephone Encounter (Signed)
[facility] Clinical Social Work  CSW was contacted by [employer] Nurse requesting CSW reach out to patient regarding bed bug treatment. CSW reached out to patient to discuss treatment to and  proof of income documents required to determined eligibility. CSW reminded patient of previous conversations about said documentation. Patient informed CSW that he turned in proof of income documents ([employer] award letter) to rad onc staff who stated they would get the documents to this CSW. CSW was able to locate to documents. Documents patient submitted were documents for his Med ONC lab testing (confirmed with nurse). Patient has not submitted requested documents to complete request for pest treatment. Pest treatment is for patients in active treatment being completed at [facility]. Patient has four treatments left and due to the delay of bring in documents is not eligible for pest treatment at this time. Request for treatment can be revisited if treatment plan changes.   Marguerita Merles, LCSWA Clinical Social Worker [facility]

## 2023-05-11 ENCOUNTER — Ambulatory Visit
Admission: RE | Admit: 2023-05-11 | Discharge: 2023-05-11 | Disposition: A | Discharge status: Home / Self Care | Payer: Medicare PPO | Source: Ambulatory Visit | Attending: Radiation Oncology | Admitting: Radiation Oncology

## 2023-05-11 ENCOUNTER — Other Ambulatory Visit: Payer: Self-pay | Admitting: Radiation Oncology

## 2023-05-11 ENCOUNTER — Ambulatory Visit: Payer: Medicare PPO

## 2023-05-11 ENCOUNTER — Other Ambulatory Visit: Payer: Self-pay

## 2023-05-11 ENCOUNTER — Telehealth: Payer: Self-pay

## 2023-05-11 DIAGNOSIS — C61 Malignant neoplasm of prostate: Secondary | ICD-10-CM | POA: Insufficient documentation

## 2023-05-11 DIAGNOSIS — R21 Rash and other nonspecific skin eruption: Secondary | ICD-10-CM | POA: Diagnosis not present

## 2023-05-11 DIAGNOSIS — R634 Abnormal weight loss: Secondary | ICD-10-CM | POA: Diagnosis not present

## 2023-05-11 DIAGNOSIS — Z510 Encounter for antineoplastic radiation therapy: Secondary | ICD-10-CM | POA: Diagnosis not present

## 2023-05-11 DIAGNOSIS — C7951 Secondary malignant neoplasm of bone: Secondary | ICD-10-CM | POA: Diagnosis not present

## 2023-05-11 DIAGNOSIS — J309 Allergic rhinitis, unspecified: Secondary | ICD-10-CM | POA: Diagnosis not present

## 2023-05-11 DIAGNOSIS — H1045 Other chronic allergic conjunctivitis: Secondary | ICD-10-CM | POA: Diagnosis not present

## 2023-05-11 LAB — RAD ONC ARIA SESSION SUMMARY
Course Elapsed Days: 9
Plan Fractions Treated to Date: 7
Plan Prescribed Dose Per Fraction: 3 Gy
Plan Total Fractions Prescribed: 10
Plan Total Prescribed Dose: 30 Gy
Reference Point Dosage Given to Date: 21 Gy
Reference Point Session Dosage Given: 3 Gy
Session Number: 7

## 2023-05-11 MED ORDER — CIPROFLOXACIN HCL 500 MG PO TABS
500.0000 mg | ORAL_TABLET | Freq: Two times a day (BID) | ORAL | 0 refills | Status: AC
Start: 2023-05-11 — End: 2023-05-21

## 2023-05-11 NOTE — Telephone Encounter (Signed)
[facility] CSW Progress Note  CSW spoke with patient about pest treatment. Due to patient finishing treatment next week patient is not eligible for pest treatment at this time. Patient verbalized understanding. Patient stated he will bring in income documents for CSW to have on file, should it become needed at a later date. CSW informed Rad Freight forwarder and Med Onc Nurse.   Marguerita Merles, LCSWA Clinical Social Worker [facility]

## 2023-05-14 ENCOUNTER — Ambulatory Visit: Admission: RE | Admit: 2023-05-14 | Payer: Medicare PPO | Source: Ambulatory Visit

## 2023-05-15 ENCOUNTER — Ambulatory Visit: Payer: Medicare PPO

## 2023-05-15 NOTE — Progress Notes (Signed)
RN spoke with patient to remind him of upcoming appointment for evaluation of his AAA.  Patient is aware and confirmed time, date, and location.    RN provided education on next steps to include follow up with Dr. Cherly Hensen to review if he will be a candidate for chemotherapy based on tomorrow's evaluation.  Verbalized understanding.  Will follow up with patient on 11/15 to remind of appointment on 11/18 per request due to patient reporting forgetfulness and feeling overwhelmed with appointments.

## 2023-05-16 ENCOUNTER — Ambulatory Visit
Admission: RE | Admit: 2023-05-16 | Discharge: 2023-05-16 | Disposition: A | Discharge status: Home / Self Care | Payer: Medicare PPO | Source: Ambulatory Visit | Attending: Radiation Oncology

## 2023-05-16 ENCOUNTER — Ambulatory Visit: Payer: Medicare PPO

## 2023-05-16 ENCOUNTER — Ambulatory Visit (HOSPITAL_COMMUNITY)
Admission: RE | Admit: 2023-05-16 | Discharge: 2023-05-16 | Disposition: A | Discharge status: Home / Self Care | Payer: Medicare PPO | Source: Ambulatory Visit | Attending: Vascular Surgery | Admitting: Vascular Surgery

## 2023-05-16 ENCOUNTER — Ambulatory Visit (INDEPENDENT_AMBULATORY_CARE_PROVIDER_SITE_OTHER): Payer: Medicare PPO | Admitting: Vascular Surgery

## 2023-05-16 ENCOUNTER — Other Ambulatory Visit: Payer: Self-pay

## 2023-05-16 VITALS — BP 138/80 | HR 73 | Temp 97.9°F | Wt 209.5 lb

## 2023-05-16 DIAGNOSIS — I7143 Infrarenal abdominal aortic aneurysm, without rupture: Secondary | ICD-10-CM

## 2023-05-16 DIAGNOSIS — I714 Abdominal aortic aneurysm, without rupture, unspecified: Secondary | ICD-10-CM | POA: Diagnosis not present

## 2023-05-16 DIAGNOSIS — C7951 Secondary malignant neoplasm of bone: Secondary | ICD-10-CM | POA: Diagnosis not present

## 2023-05-16 DIAGNOSIS — C61 Malignant neoplasm of prostate: Secondary | ICD-10-CM | POA: Diagnosis not present

## 2023-05-16 DIAGNOSIS — Z510 Encounter for antineoplastic radiation therapy: Secondary | ICD-10-CM | POA: Diagnosis not present

## 2023-05-16 LAB — RAD ONC ARIA SESSION SUMMARY
Course Elapsed Days: 14
Plan Fractions Treated to Date: 8
Plan Prescribed Dose Per Fraction: 3 Gy
Plan Total Fractions Prescribed: 10
Plan Total Prescribed Dose: 30 Gy
Reference Point Dosage Given to Date: 24 Gy
Reference Point Session Dosage Given: 3 Gy
Session Number: 8

## 2023-05-16 NOTE — Progress Notes (Signed)
VASCULAR AND VEIN SPECIALISTS OF   ASSESSMENT / PLAN: David [name]. is a 77 y.o. male with a infrarenal abdominal aortic aneurysm measuring 52mm.  Recommend:  Abstinence from all tobacco products. Blood glucose control with goal A1c < 7%. Blood pressure control with goal blood pressure < 140/90 mmHg. Lipid reduction therapy with goal LDL-C <100 mg/dL.  Aspirin 81mg  PO QD.  Atorvastatin 40-80mg  PO QD (or other "high intensity" statin therapy).  Patient counseled that repair would be recommended at 55 mm.  Repeat AAA duplex in 6 months.  CHIEF COMPLAINT: Abdominal aortic aneurysm identified  HISTORY OF PRESENT ILLNESS: David [name]. is a 77 y.o. male referred to clinic for incidental discovery of a 52 mm infrarenal abdominal aortic aneurysm on PET/CT scan.  Patient is undergoing treatment for metastatic prostate cancer.  He has been getting chemotherapy and radiotherapy.  He is doing well overall, by his estimation.  We reviewed his CT scan findings in detail.  I reviewed the natural history of abdominal aortic aneurysm disease.  I encouraged him to follow-up with me in 6 months with repeat duplex.  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    Elevated PSA    Flatulence, eructation, and gas pain    Headache(784.0)    Hemorrhoids, external    Herpes zoster with unspecified complication    Hyperpotassemia    Insomnia, unspecified    Lumbago    Lumbago    Myalgia and myositis, unspecified    Nocturia    Obesity, unspecified    Osteoarthrosis, unspecified whether generalized or localized, unspecified site    Other abnormal blood  chemistry    Other headache syndromes(339.89)    Pre-diabetes    Pure hyperglyceridemia    Rash and other nonspecific skin eruption    Rotator cuff (capsule) sprain    Routine general medical examination at a health care facility    Tobacco use disorder    Unspecified essential hypertension    Unspecified hypertrophic and atrophic condition of skin    Unspecified sleep apnea     Past Surgical History:  Procedure Laterality Date   CYSTOSCOPY N/A 08/22/2022   Procedure: CYSTOSCOPY;  Surgeon: Belva Agee, MD;  Location: [facility];  Service: Urology;  Laterality: N/A;   DENTAL SURGERY  05/10/2021   Per patient   HEMORRHOID SURGERY     PROSTATE BIOPSY     TRANSURETHRAL RESECTION OF PROSTATE N/A 08/22/2022   Procedure: TRANSURETHRAL RESECTION OF THE PROSTATE (TURP);  Surgeon: Belva Agee, MD;  Location: [facility];  Service: Urology;  Laterality: N/A;   tumorectomy Left    Removed tumor from his chest.   VARICOSE VEIN SURGERY      Family History  Problem Relation Age of Onset   Diabetes Mother    Heart disease Mother    Stroke Mother    Stroke Father    Leukemia Maternal Aunt        dx >50   Breast cancer Paternal Aunt        dx >50   Stomach cancer Cousin  d. 19; pat male cousin    Social History   Socioeconomic History   Marital status: Married    Spouse name: Not on file   Number of children: Not on file   Years of education: Not on file   Highest education level: Not on file  Occupational History   Not on file  Tobacco Use   Smoking status: Former    Current packs/day: 0.00    Average packs/day: 1.5 packs/day for 55.0 years (82.5 ttl pk-yrs)    Types: Cigarettes    Start date: 07/29/1960    Quit date: 07/30/2015    Years since quitting: 7.8   Smokeless tobacco: Never   Tobacco comments:    pt thinks increased effexor dose helped him to quit  Vaping Use   Vaping status: Never Used  Substance and Sexual  Activity   Alcohol use: No    Alcohol/week: 0.0 standard drinks of alcohol   Drug use: No   Sexual activity: Not Currently  Other Topics Concern   Not on file  Social History Narrative   Married   Stopped smoking 07/30/15   Alcohol none   Exercise none   [employer] 1987    Social Determinants of Health   Financial Resource Strain: Medium Risk (10/19/2022)   Overall Financial Resource Strain (CARDIA)    Difficulty of Paying Living Expenses: Somewhat hard  Food Insecurity: Food Insecurity Present (04/16/2023)   Hunger Vital Sign    Worried About Running Out of Food in the Last Year: Sometimes true    Ran Out of Food in the Last Year: Sometimes true  Transportation Needs: No Transportation Needs (10/19/2022)   PRAPARE - Administrator, Civil Service (Medical): No    Lack of Transportation (Non-Medical): No  Physical Activity: Inactive (10/19/2022)   Exercise Vital Sign    Days of Exercise per Week: 0 days    Minutes of Exercise per Session: 0 min  Stress: No Stress Concern Present (10/19/2022)   [employer] of Occupational Health - Occupational Stress Questionnaire    Feeling of Stress : Only a little  Social Connections: Moderately Isolated (10/19/2022)   Social Connection and Isolation Panel [NHANES]    Frequency of Communication with Friends and Family: Once a week    Frequency of Social Gatherings with Friends and Family: Once a week    Attends Religious Services: More than 4 times per year    Active Member of [employer] or Organizations: No    Attends Banker Meetings: Never    Marital Status: Married  Catering manager Violence: Not At Risk (04/16/2023)   Humiliation, Afraid, Rape, and Kick questionnaire    Fear of Current or Ex-Partner: No    Emotionally Abused: No    Physically Abused: No    Sexually Abused: No    Allergies  Allergen Reactions   Oxycontin [Oxycodone Hcl] Hives and Shortness Of Breath   Penicillins Hives and Shortness Of  Breath   Celebrex [Celecoxib]    Erythromycin    Tetracyclines & Related     Current Outpatient Medications  Medication Sig Dispense Refill   acetaminophen (TYLENOL) 325 MG tablet Take 650 mg by mouth every 6 (six) hours as needed (pain and sleep aid).     cetirizine (ZYRTEC) 10 MG tablet Take 1 tablet (10 mg total) by mouth daily. 30 tablet 11   ciprofloxacin (CIPRO) 500 MG tablet Take 1 tablet (500 mg total) by mouth 2 (two) times daily for  10 days. 20 tablet 0   enzalutamide (XTANDI) 40 MG tablet Take 160 mg by mouth daily.     finasteride (PROSCAR) 5 MG tablet TAKE 1 TABLET(5 MG) BY MOUTH DAILY 90 tablet 3   losartan (COZAAR) 50 MG tablet Take one tablet by mouth daily 90 tablet 3   silodosin (RAPAFLO) 8 MG CAPS capsule Take 8 mg by mouth daily with breakfast.     simvastatin (ZOCOR) 20 MG tablet TAKE 1 TABLET(20 MG) BY MOUTH DAILY AT 6 PM 90 tablet 1   traZODone (DESYREL) 100 MG tablet TAKE 1 TABLET(100 MG) BY MOUTH AT BEDTIME 90 tablet 1   triamcinolone cream (KENALOG) 0.1 % Apply 1 Application topically 2 (two) times daily. 30 g 4   cefpodoxime (VANTIN) 200 MG tablet Take 1 tablet (200 mg total) by mouth 2 (two) times daily. (Patient not taking: Reported on 05/16/2023) 20 tablet 0   No current facility-administered medications for this visit.    PHYSICAL EXAM Vitals:   05/16/23 0937  BP: 138/80  Pulse: 73  Temp: 97.9 F (36.6 C)  TempSrc: Temporal  SpO2: 97%  Weight: 209 lb 8 oz (95 kg)   Elderly man in no distress Regular rate and rhythm Unlabored breathing Soft nontender abdomen   PERTINENT LABORATORY AND RADIOLOGIC DATA  Most recent CBC    Latest Ref Rng & Units 04/26/2023   10:02 AM 03/29/2023   12:48 PM 12/25/2022    8:25 AM  CBC  WBC 4.0 - 10.5 K/uL 4.9  4.8  4.7   Hemoglobin 13.0 - 17.0 g/dL 16.1  09.6  04.5   Hematocrit 39.0 - 52.0 % 42.6  43.1  41.8   Platelets 150 - 400 K/uL 217  199  276      Most recent CMP    Latest Ref Rng & Units 04/26/2023    10:02 AM 03/29/2023   12:48 PM 12/25/2022    8:25 AM  CMP  Glucose 70 - 99 mg/dL 409  811  914   BUN 8 - 23 mg/dL 14  19  14    Creatinine 0.61 - 1.24 mg/dL 7.82  9.56  2.13   Sodium 135 - 145 mmol/L 140  140  140   Potassium 3.5 - 5.1 mmol/L 4.3  4.4  4.2   Chloride 98 - 111 mmol/L 104  105  102   CO2 22 - 32 mmol/L 27  29  28    Calcium 8.9 - 10.3 mg/dL 9.4  9.8  9.3   Total Protein 6.5 - 8.1 g/dL 8.1  8.2  7.6   Total Bilirubin 0.3 - 1.2 mg/dL 0.4  0.5  0.4   Alkaline Phos 38 - 126 U/L 92  94    AST 15 - 41 U/L 15  17  15    ALT 0 - 44 U/L 11  13  12      Renal function Estimated Creatinine Clearance: 86.4 mL/min (by C-G formula based on SCr of 0.64 mg/dL).  Hgb A1c MFr Bld (% of total Hgb)  Date Value  12/25/2022 6.2 (H)    LDL Cholesterol (Calc)  Date Value Ref Range Status  12/25/2022 52 mg/dL (calc) Final    Comment:    Reference range: <100 . Desirable range <100 mg/dL for primary prevention;   <70 mg/dL for patients with CHD or diabetic patients  with > or = 2 CHD risk factors. Marland Kitchen LDL-C is now calculated using the Martin-Hopkins  calculation, which is a validated novel method  providing  better accuracy than the Friedewald equation in the  estimation of LDL-C.  Horald Pollen et al. Lenox Ahr. 1610;960(45): 2061-2068  (http://education.[employer].com/faq/FAQ164)     PET CT scan 10/30/2022 Infrarenal abdominal aortic aneurysm measuring 52 mm in greatest dimension  Rande Brunt. Lenell Antu, MD [facility] Vascular and Vein Specialists of [facility] Phone Number: [phone] 05/16/2023 12:10 PM   Total time spent on preparing this encounter including chart review, data review, collecting history, examining the patient, coordinating care for this new patient, 60 minutes.  Portions of this report may have been transcribed using voice recognition software.  Every effort has been made to ensure accuracy; however, inadvertent computerized transcription errors may still be  present.

## 2023-05-17 ENCOUNTER — Ambulatory Visit
Admission: RE | Admit: 2023-05-17 | Discharge: 2023-05-17 | Disposition: A | Discharge status: Home / Self Care | Payer: Medicare PPO | Source: Ambulatory Visit | Attending: Radiation Oncology | Admitting: Radiation Oncology

## 2023-05-17 ENCOUNTER — Ambulatory Visit: Payer: Medicare PPO

## 2023-05-17 ENCOUNTER — Other Ambulatory Visit: Payer: Self-pay

## 2023-05-17 ENCOUNTER — Other Ambulatory Visit: Payer: Self-pay | Admitting: Orthopedic Surgery

## 2023-05-17 DIAGNOSIS — C61 Malignant neoplasm of prostate: Secondary | ICD-10-CM | POA: Diagnosis not present

## 2023-05-17 DIAGNOSIS — Z9109 Other allergy status, other than to drugs and biological substances: Secondary | ICD-10-CM

## 2023-05-17 DIAGNOSIS — C7951 Secondary malignant neoplasm of bone: Secondary | ICD-10-CM | POA: Diagnosis not present

## 2023-05-17 DIAGNOSIS — Z510 Encounter for antineoplastic radiation therapy: Secondary | ICD-10-CM | POA: Diagnosis not present

## 2023-05-17 LAB — RAD ONC ARIA SESSION SUMMARY
Course Elapsed Days: 15
Plan Fractions Treated to Date: 9
Plan Prescribed Dose Per Fraction: 3 Gy
Plan Total Fractions Prescribed: 10
Plan Total Prescribed Dose: 30 Gy
Reference Point Dosage Given to Date: 27 Gy
Reference Point Session Dosage Given: 3 Gy
Session Number: 9

## 2023-05-18 ENCOUNTER — Other Ambulatory Visit: Payer: Self-pay | Admitting: Radiation Oncology

## 2023-05-18 ENCOUNTER — Other Ambulatory Visit: Payer: Self-pay

## 2023-05-18 ENCOUNTER — Ambulatory Visit
Admission: RE | Admit: 2023-05-18 | Discharge: 2023-05-18 | Disposition: A | Discharge status: Home / Self Care | Payer: Medicare PPO | Source: Ambulatory Visit | Attending: Radiation Oncology | Admitting: Radiation Oncology

## 2023-05-18 ENCOUNTER — Ambulatory Visit
Admission: RE | Admit: 2023-05-18 | Discharge: 2023-05-18 | Disposition: A | Discharge status: Home / Self Care | Payer: Medicare PPO | Source: Ambulatory Visit | Attending: Radiation Oncology

## 2023-05-18 DIAGNOSIS — Z510 Encounter for antineoplastic radiation therapy: Secondary | ICD-10-CM | POA: Diagnosis not present

## 2023-05-18 DIAGNOSIS — C7951 Secondary malignant neoplasm of bone: Secondary | ICD-10-CM | POA: Diagnosis not present

## 2023-05-18 DIAGNOSIS — C61 Malignant neoplasm of prostate: Secondary | ICD-10-CM | POA: Diagnosis not present

## 2023-05-18 LAB — RAD ONC ARIA SESSION SUMMARY
Course Elapsed Days: 16
Plan Fractions Treated to Date: 10
Plan Prescribed Dose Per Fraction: 3 Gy
Plan Total Fractions Prescribed: 10
Plan Total Prescribed Dose: 30 Gy
Reference Point Dosage Given to Date: 30 Gy
Reference Point Session Dosage Given: 3 Gy
Session Number: 10

## 2023-05-21 NOTE — Radiation Completion Notes (Addendum)
  Radiation Oncology         [phone]) [phone] ________________________________  Name: David [name]. MRN: [id number]  Date: 05/18/2023  DOB: 07/21/1945  Referring Physician: Hazle Nordmann, M.D. Date of Service: 2023-05-21 Radiation Oncologist: Margaretmary Bayley, M.D. [facility]     RADIATION ONCOLOGY END OF TREATMENT NOTE     Diagnosis: 77 yo man with left femoral bone metastasis from stage IV prostate cancer   Intent: Palliative     ==========DELIVERED PLANS==========  First Treatment Date: 2023-05-02 - Last Treatment Date: 2023-05-18   Plan Name: Pelvis_L_Hip Site: Hip, Left Technique: 3D Mode: Photon Dose Per Fraction: 3 Gy Prescribed Dose (Delivered / Prescribed): 30 Gy / 30 Gy Prescribed Fxs (Delivered / Prescribed): 10 / 10     ==========ON TREATMENT VISIT DATES========== 2023-05-04, 2023-05-11, 2023-05-18     See weekly On Treatment Notes in Epic for details.  He tolerated the daily radiation treatments well with only modest fatigue.  The patient will receive a call in about one month from the radiation oncology department. He will continue follow up with his urologist and Dr. Cherly Hensen in medical oncology as well.  ------------------------------------------------   Margaretmary Dys, MD [facility]  Radiation Oncology Direct Dial: [phone]  Fax: [phone] [email or web address]  Skype  [email or web address]

## 2023-05-23 ENCOUNTER — Other Ambulatory Visit: Payer: Self-pay | Admitting: Orthopedic Surgery

## 2023-05-23 DIAGNOSIS — I10 Essential (primary) hypertension: Secondary | ICD-10-CM

## 2023-05-24 DIAGNOSIS — Z85828 Personal history of other malignant neoplasm of skin: Secondary | ICD-10-CM | POA: Diagnosis not present

## 2023-05-24 DIAGNOSIS — Z85820 Personal history of malignant melanoma of skin: Secondary | ICD-10-CM | POA: Diagnosis not present

## 2023-05-24 DIAGNOSIS — L739 Follicular disorder, unspecified: Secondary | ICD-10-CM | POA: Diagnosis not present

## 2023-05-24 DIAGNOSIS — Z09 Encounter for follow-up examination after completed treatment for conditions other than malignant neoplasm: Secondary | ICD-10-CM | POA: Diagnosis not present

## 2023-05-24 DIAGNOSIS — Z86007 Personal history of in-situ neoplasm of skin: Secondary | ICD-10-CM | POA: Diagnosis not present

## 2023-05-24 DIAGNOSIS — L821 Other seborrheic keratosis: Secondary | ICD-10-CM | POA: Diagnosis not present

## 2023-05-24 DIAGNOSIS — L814 Other melanin hyperpigmentation: Secondary | ICD-10-CM | POA: Diagnosis not present

## 2023-05-24 DIAGNOSIS — Z08 Encounter for follow-up examination after completed treatment for malignant neoplasm: Secondary | ICD-10-CM | POA: Diagnosis not present

## 2023-05-24 DIAGNOSIS — D225 Melanocytic nevi of trunk: Secondary | ICD-10-CM | POA: Diagnosis not present

## 2023-05-24 DIAGNOSIS — Z872 Personal history of diseases of the skin and subcutaneous tissue: Secondary | ICD-10-CM | POA: Diagnosis not present

## 2023-05-25 ENCOUNTER — Other Ambulatory Visit: Payer: Self-pay | Admitting: *Deleted

## 2023-05-25 DIAGNOSIS — C61 Malignant neoplasm of prostate: Secondary | ICD-10-CM

## 2023-05-25 NOTE — Progress Notes (Signed)
RN spoke with patient to remind him of upcoming appointment with Dr. Cherly Hensen on 11/18.  Patient understands this appointment will be to review potential options for treatment based on recent evaluation of his AAA.

## 2023-05-25 NOTE — Assessment & Plan Note (Signed)
Continue ADT  Start enzalutamide on  He is on low-dose simvastatin, will monitor for any toxicity. Discussed docetaxel. Given the AAA, discuss potential rupture which we cannot predict. After discussion will use lower dose Return to see me with PSA, testosterone, CMP and CBC in  Radiation to the L femur to be started but patient reports bed bugs. I contact NN to let them know.

## 2023-05-25 NOTE — Progress Notes (Unsigned)
Patient Care Team: Octavia Heir, NP as PCP - General (Adult Health Nurse Practitioner) Antony Contras, MD as Consulting Physician (Ophthalmology) Octavia Heir, NP as Nurse Practitioner (Adult Health Nurse Practitioner) Cherlyn Cushing, RN as Oncology Nurse Navigator  Clinic Day:  05/28/2023  Referring physician: Octavia Heir, NP  ASSESSMENT & PLAN:   Assessment & Plan: David [name] is a 77 y.o. man with history of hypertension, hyperlipidemia here for follow-up prostate cancer   Current Diagnosis: mHSPC Initial diagnosis: 08/2022. mHSPC with lymph node, bone, lung metastases. Current Treatment: ADT, darolutamide. Genetic testing for metastatic prostate cancer: neg. 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   Somatic testing, no actionable mutation.  Variant of SF3B1, TP53, CTNNB1. pMMR. TMB 3.7. No HRR mutation.  We discussed adding chemotherapy as well.  He recently started enzalutamide having some dizziness with it.  He is at older age and we should prevent falls.  I recommend changing to darolutamide.  Given his AAA and age, consider chemotherapy with docetaxel at lower dose 50 mg/m 2 weeks interval.  We discussed potential side effects of chemotherapy today.  Information was given to him.  After discussion, he would like to proceed.  Prostate cancer metastatic to bone [facility]) Continue ADT  Start enzalutamide in Oct. Given dizziness will switch to darolutamide.  He is on low-dose simvastatin, will monitor for any toxicity. Discussed docetaxel. Given the AAA, discuss potential rupture which we cannot predict. After discussion will use lower dose Return to see me with PSA, testosterone, CMP and CBC in 3-4 weeks with starting of docetaxel. Radiation completed on L hip/pelvis 30 Gy/10 fx  At risk for side effect of medication Supportive baseline bone mineral density: 04/06/2023 Right Femur Neck  T= -1.8  calcium (1000-1200 mg daily from food and  supplements) and vitamin D3 (1000 IU daily) Control and prevent diabetes Did not have time to address dental evaluation today exercises (30 minutes per day) Limit alcohol consumption and avoid smoking Monitoring control blood pressure  Genetic testing VUS in TSC1 at p.T574S (c.1721C>G).   Infestation by bed bug Appreciate our Social worker helping with arrangement for home decontamination and treatment.  Essential hypertension On losartan 50 mg daily Will increase to 100 mg daily. Goal is <140/90    The patient understands the plans discussed today and is in agreement with them.  He knows to contact our office if he develops concerns prior to his next appointment.  Plan to start docetaxel every 2 weeks as tolerated in December.  Melven Sartorius, MD  [facility] [address] [place] [address] Dept: [phone] Dept Fax: [phone]   Orders Placed This Encounter  Procedures   CBC with Differential (Cancer Center Only)    Standing Status:   Future    Standing Expiration Date:   06/28/2024   CMP (Cancer Center only)    Standing Status:   Future    Standing Expiration Date:   06/28/2024   CBC with Differential (Cancer Center Only)    Standing Status:   Future    Standing Expiration Date:   07/12/2024   CMP (Cancer Center only)    Standing Status:   Future    Standing Expiration Date:   07/12/2024   PSA, total and free    Standing Status:   Future    Standing Expiration Date:   06/28/2024  Testosterone    Standing Status:   Future    Standing Expiration Date:   06/28/2024   PSA, total and free    Standing Status:   Future    Standing Expiration Date:   07/12/2024   Testosterone    Standing Status:   Future    Standing Expiration Date:   07/12/2024   PSA, total and free    Standing Status:   Future    Standing Expiration Date:   07/26/2024   Testosterone    Standing Status:    Future    Standing Expiration Date:   07/26/2024   CBC with Differential (Cancer Center Only)    Standing Status:   Future    Standing Expiration Date:   07/26/2024   CMP (Cancer Center only)    Standing Status:   Future    Standing Expiration Date:   07/26/2024   PSA, total and free    Standing Status:   Future    Standing Expiration Date:   08/09/2024   Testosterone    Standing Status:   Future    Standing Expiration Date:   08/09/2024   CBC with Differential (Cancer Center Only)    Standing Status:   Future    Standing Expiration Date:   08/09/2024   CMP (Cancer Center only)    Standing Status:   Future    Standing Expiration Date:   08/09/2024   PSA, total and free    Standing Status:   Future    Standing Expiration Date:   08/23/2024   Testosterone    Standing Status:   Future    Standing Expiration Date:   08/23/2024   CBC with Differential (Cancer Center Only)    Standing Status:   Future    Standing Expiration Date:   08/23/2024   CMP (Cancer Center only)    Standing Status:   Future    Standing Expiration Date:   08/23/2024   PSA, total and free    Standing Status:   Future    Standing Expiration Date:   09/06/2024   Testosterone    Standing Status:   Future    Standing Expiration Date:   09/06/2024   CBC with Differential (Cancer Center Only)    Standing Status:   Future    Standing Expiration Date:   09/06/2024   CMP (Cancer Center only)    Standing Status:   Future    Standing Expiration Date:   09/06/2024   PSA, total and free    Standing Status:   Future    Standing Expiration Date:   09/20/2024   Testosterone    Standing Status:   Future    Standing Expiration Date:   09/20/2024   CBC with Differential (Cancer Center Only)    Standing Status:   Future    Standing Expiration Date:   09/20/2024   CMP (Cancer Center only)    Standing Status:   Future    Standing Expiration Date:   09/20/2024   PSA, total and free    Standing Status:   Future    Standing Expiration  Date:   10/04/2024   Testosterone    Standing Status:   Future    Standing Expiration Date:   10/04/2024   CBC with Differential (Cancer Center Only)    Standing Status:   Future    Standing Expiration Date:   10/04/2024   CMP (Cancer Center only)    Standing Status:   Future  Standing Expiration Date:   10/04/2024      CHIEF COMPLAINT:  CC: prostate cancer  Current Treatment:  enza/ADT  INTERVAL HISTORY:  David [name] is here today for repeat clinical assessment.  No falling or seizure. Dizziness that is lasting maybe 15 to 20 minutes. Some hot flashes and sweating. No nausea, vomiting or stomach pain.  Making good u/o. Color is mostly clear except. He does not drinks much water.  No chest pain, headache, short of breath.  He saw vascular surgeon and has no concerns. Plan to follow up in 6 months.  I have reviewed the past medical history, past surgical history, social history and family history with the patient and they are unchanged from previous note.  ALLERGIES:  is allergic to oxycontin [oxycodone hcl], penicillins, celebrex [celecoxib], erythromycin, and tetracyclines & related.  MEDICATIONS:  Current Outpatient Medications  Medication Sig Dispense Refill   darolutamide (NUBEQA) 300 MG tablet Take 2 tablets (600 mg total) by mouth 2 (two) times daily with a meal. 120 tablet 11   acetaminophen (TYLENOL) 325 MG tablet Take 650 mg by mouth every 6 (six) hours as needed (pain and sleep aid).     cetirizine (ZYRTEC) 10 MG tablet Take 1 tablet (10 mg total) by mouth daily. 30 tablet 11   finasteride (PROSCAR) 5 MG tablet TAKE 1 TABLET(5 MG) BY MOUTH DAILY 90 tablet 3   losartan (COZAAR) 100 MG tablet TAKE 1 TABLET BY MOUTH DAILY 90 tablet 3   silodosin (RAPAFLO) 8 MG CAPS capsule Take 8 mg by mouth daily with breakfast.     simvastatin (ZOCOR) 20 MG tablet TAKE 1 TABLET(20 MG) BY MOUTH DAILY AT 6 PM 90 tablet 1   traZODone (DESYREL) 100 MG tablet TAKE 1 TABLET(100 MG) BY MOUTH AT  BEDTIME 90 tablet 1   triamcinolone cream (KENALOG) 0.1 % Apply 1 Application topically 2 (two) times daily. 30 g 4   No current facility-administered medications for this visit.    HISTORY OF PRESENT ILLNESS:   Oncology History  Prostate cancer metastatic to bone [facility])  08/2022 Surgery   TURP for BPH: Prostatic adenocarcinoma, Gleason score 4+5=9 (grade group5) involves 80% of the resected tissue.  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    08/2022 Initial Diagnosis   Prostate cancer metastatic to bone (HCC)   09/28/2022 Tumor Marker   PSA 33.5   10/30/2022 Imaging   PSMA PET IMPRESSION: 1. There is diffuse radiotracer uptake throughout the prostate gland compatible with residual/recurrent prostate cancer. 2. Bilateral tracer avid pelvic lymph nodes compatible with nodal metastasis. 3. Multifocal tracer avid bone metastases. 4. Solitary tracer avid pulmonary nodule within the right upper lobe is identified. 5. 5.2 cm infrarenal abdominal aortic aneurysm. Recommend follow-up every 6 months and vascular consultation. This recommendation follows ACR consensus guidelines: White Paper of the ACR Incidental Findings Committee II on Vascular Findings. J Am Coll Radiol 2013; 10:789-794. Aortic aneurysm NOS (ICD10-I71.9). 6.  Aortic Atherosclerosis (ICD10-I70.0).   11/2022 -  Chemotherapy   started Orgovynx and converted to Eligard.   03/02/2023 Tumor Marker   PSA 14.8 testosterone 19.7. alk phos 77   04/09/2023 Genetic Testing   Negative CustomNext-Cancer +RNAinsight Panel.  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  Report date is 04/09/2023.   The CustomNext-Cancer+RNAinsight panel offered by [employer] includes sequencing, rearrangement, and RNA analysis for the following 47  genes:  APC, ATM, AXIN2, BAP1, BARD1, BMPR1A, BRCA1, BRCA2,  BRIP1, CDH1, CDK4, CDKN2A, CHEK2, CTNNA1, DICER1, EPCAM, FH, GREM1, HOXB13, KIT, MEN1, MLH1, MSH2, MSH3, MSH6, MUTYH, NF1, NTHL1, PALB2, PDGFRA, PMS2, POLD1, POLE, PTEN, RAD51C, RAD51D, SDHA, SDHB, SDHC, SDHD, SMAD4, SMARCA4, STK11, TP53, TSC1, TSC2, VHL.     04/2023 -  Chemotherapy   Started Xtandi 160 mg.   06/29/2023 -  Chemotherapy   Patient is on Treatment Plan : PROSTATE Docetaxel (50) q14d         REVIEW OF SYSTEMS:   All relevant systems were reviewed with the patient and are negative.   VITALS:  Blood pressure (!) 186/78, pulse 61, temperature (!) 97.3 F (36.3 C), temperature source Temporal, resp. rate 15, weight 206 lb 12.8 oz (93.8 kg), SpO2 97%.  Wt Readings from Last 3 Encounters:  05/28/23 206 lb 12.8 oz (93.8 kg)  05/16/23 209 lb 8 oz (95 kg)  05/03/23 212 lb 9.6 oz (96.4 kg)    Body mass index is 31.44 kg/m.  Performance status (ECOG): 1 - Symptomatic but completely ambulatory  PHYSICAL EXAM:   GENERAL:alert, no distress and comfortable SKIN: skin color normal, rashes over neck area EYES: sclera clear LUNGS: normal breathing effort. Musculoskeletal: no edema  LABORATORY DATA:  I have reviewed the data as listed    Component Value Date/Time   NA 137 05/28/2023 0825   NA 138 11/03/2015 0835   K 4.5 05/28/2023 0825   CL 104 05/28/2023 0825   CO2 27 05/28/2023 0825   GLUCOSE 163 (H) 05/28/2023 0825   BUN 19 05/28/2023 0825   BUN 16 11/03/2015 0835   CREATININE 0.66 05/28/2023 0825   CREATININE 0.71 12/25/2022 0825   CALCIUM 9.4 05/28/2023 0825   PROT 8.0 05/28/2023 0825   PROT 7.1 11/03/2015 0835   ALBUMIN 4.3 05/28/2023 0825   ALBUMIN 4.4 11/03/2015 0835   AST 18 05/28/2023 0825   ALT 15 05/28/2023 0825   ALKPHOS 70 05/28/2023 0825   BILITOT 0.4 05/28/2023 0825   GFRNONAA >60 05/28/2023 0825   GFRNONAA 87 11/12/2020 1158   GFRAA 101 11/12/2020 1158    No results found for: "SPEP", "UPEP"  Lab Results  Component Value Date   WBC 5.3  05/28/2023   NEUTROABS 3.2 05/28/2023   HGB 14.6 05/28/2023   HCT 44.1 05/28/2023   MCV 94.6 05/28/2023   PLT 201 05/28/2023      Chemistry      Component Value Date/Time   NA 137 05/28/2023 0825   NA 138 11/03/2015 0835   K 4.5 05/28/2023 0825   CL 104 05/28/2023 0825   CO2 27 05/28/2023 0825   BUN 19 05/28/2023 0825   BUN 16 11/03/2015 0835   CREATININE 0.66 05/28/2023 0825   CREATININE 0.71 12/25/2022 0825      Component Value Date/Time   CALCIUM 9.4 05/28/2023 0825   ALKPHOS 70 05/28/2023 0825   AST 18 05/28/2023 0825   ALT 15 05/28/2023 0825   BILITOT 0.4 05/28/2023 0825       RADIOGRAPHIC STUDIES: I have personally reviewed the radiological images as listed and agreed with the findings in the report. VAS [place] AAA DUPLEX  Result Date: 05/16/2023 ABDOMINAL AORTA STUDY Patient Name:  David [name].  Date of Exam:   05/16/2023 Medical Rec #: [id number]              Accession #:    [id number] Date of Birth: 1946-03-18  Patient Gender: M Patient Age:   41 years Exam Location:  [facility] Procedure:      VAS [place] AAA DUPLEX Referring Phys: Lemar Livings --------------------------------------------------------------------------------  Indications: Follow up AAA seen on PET scan Risk Factors: Hypertension, hyperlipidemia, past history of smoking.  Comparison Study: No prior study Performing Technologist: Gertie Fey MHA, RDMS, RVT, RDCS  Examination Guidelines: A complete evaluation includes B-mode imaging, spectral Doppler, color Doppler, and power Doppler as needed of all accessible portions of each vessel. Bilateral testing is considered an integral part of a complete examination. Limited examinations for reoccurring indications may be performed as noted.  Abdominal Aorta Findings: +-----------+-------+----------+----------+--------+--------+--------+ Location   AP (cm)Trans (cm)PSV (cm/s)WaveformThrombusComments  +-----------+-------+----------+----------+--------+--------+--------+ Proximal   2.67   2.64      90                                 +-----------+-------+----------+----------+--------+--------+--------+ Mid        4.91   5.54      64                                 +-----------+-------+----------+----------+--------+--------+--------+ Distal     2.91   2.91      73                                 +-----------+-------+----------+----------+--------+--------+--------+ RT CIA Prox3.6    3.4       26                                 +-----------+-------+----------+----------+--------+--------+--------+ LT CIA Prox2.5    2.2       41                                 +-----------+-------+----------+----------+--------+--------+--------+  Summary: Abdominal Aorta: There is evidence of abnormal dilatation of the mid Abdominal aorta. There is evidence of abnormal dilation of the Right Common Iliac artery.  *See table(s) above for measurements and observations.  Electronically signed by Lemar Livings MD on 05/16/2023 at 4:22:45 PM.    Final

## 2023-05-28 ENCOUNTER — Telehealth: Payer: Self-pay

## 2023-05-28 ENCOUNTER — Inpatient Hospital Stay: Payer: Medicare PPO

## 2023-05-28 ENCOUNTER — Other Ambulatory Visit: Payer: Self-pay

## 2023-05-28 ENCOUNTER — Other Ambulatory Visit (HOSPITAL_COMMUNITY): Payer: Self-pay

## 2023-05-28 ENCOUNTER — Telehealth: Payer: Self-pay | Admitting: Pharmacy Technician

## 2023-05-28 ENCOUNTER — Inpatient Hospital Stay (HOSPITAL_BASED_OUTPATIENT_CLINIC_OR_DEPARTMENT_OTHER): Payer: Medicare PPO

## 2023-05-28 VITALS — BP 186/78 | HR 61 | Temp 97.3°F | Resp 15 | Wt 206.8 lb

## 2023-05-28 DIAGNOSIS — Z87891 Personal history of nicotine dependence: Secondary | ICD-10-CM | POA: Diagnosis not present

## 2023-05-28 DIAGNOSIS — R42 Dizziness and giddiness: Secondary | ICD-10-CM | POA: Diagnosis not present

## 2023-05-28 DIAGNOSIS — I10 Essential (primary) hypertension: Secondary | ICD-10-CM | POA: Diagnosis not present

## 2023-05-28 DIAGNOSIS — R232 Flushing: Secondary | ICD-10-CM | POA: Diagnosis not present

## 2023-05-28 DIAGNOSIS — Z1379 Encounter for other screening for genetic and chromosomal anomalies: Secondary | ICD-10-CM | POA: Diagnosis not present

## 2023-05-28 DIAGNOSIS — C7951 Secondary malignant neoplasm of bone: Secondary | ICD-10-CM | POA: Insufficient documentation

## 2023-05-28 DIAGNOSIS — Z923 Personal history of irradiation: Secondary | ICD-10-CM | POA: Diagnosis not present

## 2023-05-28 DIAGNOSIS — I7143 Infrarenal abdominal aortic aneurysm, without rupture: Secondary | ICD-10-CM | POA: Diagnosis not present

## 2023-05-28 DIAGNOSIS — B888 Other specified infestations: Secondary | ICD-10-CM | POA: Insufficient documentation

## 2023-05-28 DIAGNOSIS — Z9189 Other specified personal risk factors, not elsewhere classified: Secondary | ICD-10-CM

## 2023-05-28 DIAGNOSIS — C61 Malignant neoplasm of prostate: Secondary | ICD-10-CM | POA: Insufficient documentation

## 2023-05-28 DIAGNOSIS — Z79899 Other long term (current) drug therapy: Secondary | ICD-10-CM | POA: Diagnosis not present

## 2023-05-28 LAB — CBC WITH DIFFERENTIAL (CANCER CENTER ONLY)
Abs Immature Granulocytes: 0.02 10*3/uL (ref 0.00–0.07)
Basophils Absolute: 0.1 10*3/uL (ref 0.0–0.1)
Basophils Relative: 1 %
Eosinophils Absolute: 0.1 10*3/uL (ref 0.0–0.5)
Eosinophils Relative: 2 %
HCT: 44.1 % (ref 39.0–52.0)
Hemoglobin: 14.6 g/dL (ref 13.0–17.0)
Immature Granulocytes: 0 %
Lymphocytes Relative: 25 %
Lymphs Abs: 1.3 10*3/uL (ref 0.7–4.0)
MCH: 31.3 pg (ref 26.0–34.0)
MCHC: 33.1 g/dL (ref 30.0–36.0)
MCV: 94.6 fL (ref 80.0–100.0)
Monocytes Absolute: 0.6 10*3/uL (ref 0.1–1.0)
Monocytes Relative: 11 %
Neutro Abs: 3.2 10*3/uL (ref 1.7–7.7)
Neutrophils Relative %: 61 %
Platelet Count: 201 10*3/uL (ref 150–400)
RBC: 4.66 MIL/uL (ref 4.22–5.81)
RDW: 13.2 % (ref 11.5–15.5)
WBC Count: 5.3 10*3/uL (ref 4.0–10.5)
nRBC: 0 % (ref 0.0–0.2)

## 2023-05-28 LAB — CMP (CANCER CENTER ONLY)
ALT: 15 U/L (ref 0–44)
AST: 18 U/L (ref 15–41)
Albumin: 4.3 g/dL (ref 3.5–5.0)
Alkaline Phosphatase: 70 U/L (ref 38–126)
Anion gap: 6 (ref 5–15)
BUN: 19 mg/dL (ref 8–23)
CO2: 27 mmol/L (ref 22–32)
Calcium: 9.4 mg/dL (ref 8.9–10.3)
Chloride: 104 mmol/L (ref 98–111)
Creatinine: 0.66 mg/dL (ref 0.61–1.24)
GFR, Estimated: >60 mL/min (ref >60)
Glucose, Bld: 163 mg/dL — ABNORMAL HIGH (ref 70–99)
Potassium: 4.5 mmol/L (ref 3.5–5.1)
Sodium: 137 mmol/L (ref 135–145)
Total Bilirubin: 0.4 mg/dL (ref <1.2)
Total Protein: 8 g/dL (ref 6.5–8.1)

## 2023-05-28 MED ORDER — LOSARTAN POTASSIUM 100 MG PO TABS: ORAL_TABLET | ORAL | 3 refills | Status: DC

## 2023-05-28 MED ORDER — DAROLUTAMIDE 300 MG PO TABS: 600.0000 mg | ORAL_TABLET | Freq: Two times a day (BID) | ORAL | 11 refills | Status: DC

## 2023-05-28 NOTE — Assessment & Plan Note (Signed)
Appreciate our Social worker helping with arrangement for home decontamination and treatment.

## 2023-05-28 NOTE — Progress Notes (Signed)
[facility] CSW Progress Note  Visual merchandiser  spoke with patient on this date  to discuss pest treatment at home. CSW and patient discussed providers plans to begin chemotherapy in coming weeks. CSW assessed if pest treatment is still needed, patient verbalized need. CSW informed patient that [employer]  will contact him to schedule time to come to home to treat pest (verbal authorization was given). CSW informed patient to attempt to get earliest appointment available, patient verbalized understanding. Patient provider verbal authorization for [employer]- to leave vm on his phone.  Medical team has been notified.   Marguerita Merles, LCSW Clinical Social Worker [facility]

## 2023-05-28 NOTE — Assessment & Plan Note (Addendum)
Supportive baseline bone mineral density: 04/06/2023 Right Femur Neck  T= -1.8  calcium (1000-1200 mg daily from food and supplements) and vitamin D3 (1000 IU daily) Control and prevent diabetes Did not have time to address dental evaluation today exercises (30 minutes per day) Limit alcohol consumption and avoid smoking Monitoring control blood pressure

## 2023-05-28 NOTE — Telephone Encounter (Signed)
Oral Oncology Patient Advocate Encounter   Received notification that prior authorization for Nubeqa is required.   PA submitted on 05/28/23 Key [id number] Status is pending     [name], CPhT-Adv Oncology Pharmacy Patient Advocate [facility] Direct Number: [phone]  Fax: [phone],

## 2023-05-28 NOTE — Telephone Encounter (Signed)
[facility] CSW Progress Note  Clinical Social Worker  attempted to reach patient following provider appointment  to discuss pest treatment. No answer, unable to leave vm. CSW will attempt to reach again.    Marguerita Merles, LCSW Clinical Social Worker [facility]

## 2023-05-28 NOTE — Patient Instructions (Signed)
We will see if we can get darolutamide (nubeqa) approved for you. In the meantime finish your enzalutamide [facility]) supplies.  You can consider adding chemotherapy once every 2 weeks at lower dose. We discussed potential side effects.

## 2023-05-28 NOTE — Assessment & Plan Note (Signed)
VUS in TSC1 at p.T574S (c.1721C>G).

## 2023-05-28 NOTE — Progress Notes (Signed)
START ON PATHWAY REGIMEN - Prostate     Darolutamide: A cycle is every 28 days:     Darolutamide    Docetaxel cycles 1 through 6: A cycle is every 21 days:     Docetaxel   **Always confirm dose/schedule in your pharmacy ordering system**  Patient Characteristics: Adenocarcinoma, Recurrent/New Systemic Disease (Including Biochemical Recurrence), Non-Castrate, M1 Histology: Adenocarcinoma Therapeutic Status: Recurrent/New Systemic Disease (Including Biochemical Recurrence) Intent of Therapy: Non-Curative / Palliative Intent, Not Discussed with Patient

## 2023-05-28 NOTE — Telephone Encounter (Signed)
Oral Oncology Patient Advocate Encounter  Prior Authorization for David [name] has been approved.    PA# [id number] Effective dates: 04/28/23 through 11/07/18/23  Patients co-pay is $43.    [name], CPhT-Adv Oncology Pharmacy Patient Advocate [facility] Direct Number: [phone]  Fax: [phone]

## 2023-05-28 NOTE — Assessment & Plan Note (Addendum)
On losartan 50 mg daily Will increase to 100 mg daily. Goal is <140/90

## 2023-05-29 ENCOUNTER — Other Ambulatory Visit: Payer: Self-pay

## 2023-05-29 LAB — PSA, TOTAL AND FREE
PSA, Free Pct: 15 %
PSA, Free: 0.36 ng/mL
Prostate Specific Ag, Serum: 2.4 ng/mL (ref 0.0–4.0)

## 2023-05-29 LAB — TESTOSTERONE: Testosterone: 15 ng/dL — ABNORMAL LOW (ref 264–916)

## 2023-05-29 NOTE — Telephone Encounter (Addendum)
Oral Oncology Pharmacist Encounter  Received new prescription for Nubeqa (Darolutamide) for the treatment of metastatic, hormone sensitive, prostate cancer in conjuction with docetaxel, planned duration until disease progression or unacceptable toxicity.  Labs from 05/28/23 assessed, CMP WNL, Scr 0.66 (CrCl 124.36). CBC WNL, ANC 3.2 K/uL.  Current medication list in Epic reviewed, DDIs with Nubeqa identified: - Category C DDI with Nubeqa and Simvastatin. Nubeqa may increase the serum concentration of Simvastatin. Will counsel patient to look out for simvastatin toxicities (muscle pain).  Evaluated chart and no patient barriers to medication adherence noted.   Patient agreement for treatment documented in MD note on 05/28/23.  Prescription has been e-scribed to the [facility] for benefits analysis and approval.  Oral Oncology Clinic will continue to follow for insurance authorization, copayment issues, initial counseling and start date.  Francetta Found, PharmD Candidate Class of 2025  05/29/2023 9:40 AM Oral Oncology Clinic [phone]

## 2023-05-31 ENCOUNTER — Other Ambulatory Visit: Payer: Self-pay

## 2023-05-31 DIAGNOSIS — I7143 Infrarenal abdominal aortic aneurysm, without rupture: Secondary | ICD-10-CM

## 2023-06-05 ENCOUNTER — Other Ambulatory Visit (HOSPITAL_COMMUNITY): Payer: Self-pay

## 2023-06-05 NOTE — Progress Notes (Signed)
RN returned call.  No answer, no option to leave voicemail.  Will attempt again at later time.

## 2023-06-06 DIAGNOSIS — C7951 Secondary malignant neoplasm of bone: Secondary | ICD-10-CM | POA: Diagnosis not present

## 2023-06-06 DIAGNOSIS — R338 Other retention of urine: Secondary | ICD-10-CM | POA: Diagnosis not present

## 2023-06-06 DIAGNOSIS — C61 Malignant neoplasm of prostate: Secondary | ICD-10-CM | POA: Diagnosis not present

## 2023-06-19 ENCOUNTER — Ambulatory Visit
Admission: RE | Admit: 2023-06-19 | Discharge: 2023-06-19 | Disposition: A | Discharge status: Home / Self Care | Payer: Medicare PPO | Source: Ambulatory Visit | Attending: Radiation Oncology | Admitting: Radiation Oncology

## 2023-06-19 NOTE — Progress Notes (Signed)
  Radiation Oncology         [phone]) [phone] ________________________________  Name: David [name]. MRN: [id number]  Date of Service: 06/19/2023  DOB: 16-Apr-1946  Post Treatment Telephone Note  Diagnosis:  C79.51 Secondary malignant neoplasm of bone (as documented in provider EOT note)  The patient was not available for call today.  The patient is scheduled for ongoing care with Dr. Cherly Hensen in medical oncology. The patient was encouraged to call if he  develops concerns or questions regarding radiation.    Ruel Favors, LPN

## 2023-06-20 ENCOUNTER — Telehealth: Payer: Self-pay

## 2023-06-20 ENCOUNTER — Telehealth: Payer: Self-pay | Admitting: Pharmacy Technician

## 2023-06-20 ENCOUNTER — Other Ambulatory Visit (HOSPITAL_COMMUNITY): Payer: Self-pay

## 2023-06-20 DIAGNOSIS — C61 Malignant neoplasm of prostate: Secondary | ICD-10-CM | POA: Diagnosis not present

## 2023-06-20 NOTE — Telephone Encounter (Signed)
Oral Oncology Patient Advocate Encounter   Began application for assistance for Nubeqa through [employer].   Application will be submitted upon completion of necessary supporting documentation.   [employer] phone number [phone].   I will continue to check the status until final determination.   [name], CPhT-Adv Oncology Pharmacy Patient Advocate [facility] Direct Number: [phone]  Fax: [phone]

## 2023-06-20 NOTE — Telephone Encounter (Signed)
[facility] CSW Progress Note  Pharmacist Rosalita Levan requested patient be assessed for [employer] for medication assistance. Patient unfortunately, is not eligible for [employer] at this time due to patient being insured / housed / and not meeting presumptive eligibility. Pharmacist will continue to explore assistance options.     Marguerita Merles, LCSW Clinical Social Worker [facility]

## 2023-06-20 NOTE — Telephone Encounter (Signed)
Was asked by pharmacist, Rosalita Levan, who is working on pt's oral chemo regimen, to f/u with pt after it was reported to her that pt had an infection. TC to pt, and he reports that [facility] collected a urine sample and put him on an antibiotic approx one week ago. He had a f/u with Dr. Alvester Morin today and was told that his medication needed to be switched to Cefpodoxime-pro (100 mg bid x 7 days), starting today. Pt has questions and concerns regarding oral chemo due to the fact that he cannot afford Nubeqa any longer. He is wondering exactly when to take the last dose of xtandi. He also states he has a urology appt on 12/16 and cannot meet Marisue Ivan, RN navigator that morning due to this. Advised pt that pharmacist and L. Shumate, RN would be notified of his questions/concerns.

## 2023-06-21 ENCOUNTER — Other Ambulatory Visit (HOSPITAL_COMMUNITY): Payer: Self-pay

## 2023-06-21 NOTE — Telephone Encounter (Signed)
 Oral Oncology Patient Advocate Encounter   Submitted application for assistance for Nubeqa to [facility].   Application submitted via e-fax to [phone]   [employer] phone number [phone].   I will continue to check the status until final determination.   David [name], CPhT-Adv Oncology Pharmacy Patient Advocate [facility] Direct Number: [phone]  Fax: [phone]

## 2023-06-21 NOTE — Telephone Encounter (Signed)
Oral Oncology Patient Advocate Encounter  Patient came to lobby and signed application. Application is now pending MD signatures.  [name], CPhT-Adv Oncology Pharmacy Patient Advocate [facility] Direct Number: [phone]  Fax: [phone]

## 2023-06-21 NOTE — Progress Notes (Signed)
Pharmacist Chemotherapy Monitoring - Initial Assessment    Anticipated start date: 06/29/23   The following has been reviewed per standard work regarding the patient's treatment regimen: The patient's diagnosis, treatment plan and drug doses, and organ/hematologic function Lab orders and baseline tests specific to treatment regimen  The treatment plan start date, drug sequencing, and pre-medications Prior authorization status  Patient's documented medication list, including drug-drug interaction screen and prescriptions for anti-emetics and supportive care specific to the treatment regimen The drug concentrations, fluid compatibility, administration routes, and timing of the medications to be used The patient's access for treatment and lifetime cumulative dose history, if applicable  The patient's medication allergies and previous infusion related reactions, if applicable   Changes made to treatment plan:  switch to insurance preferred biosimilar Fulphila substituted for Udenyca due to [employer] shortage  Follow up needed:  N/A   Drusilla Kanner, PharmD, MBA

## 2023-06-25 ENCOUNTER — Telehealth: Payer: Self-pay

## 2023-06-25 ENCOUNTER — Telehealth: Payer: Self-pay | Admitting: Pain Medicine

## 2023-06-27 ENCOUNTER — Other Ambulatory Visit: Payer: Medicare PPO

## 2023-06-27 ENCOUNTER — Other Ambulatory Visit: Payer: Self-pay

## 2023-06-27 DIAGNOSIS — R3914 Feeling of incomplete bladder emptying: Secondary | ICD-10-CM | POA: Diagnosis not present

## 2023-06-27 DIAGNOSIS — C61 Malignant neoplasm of prostate: Secondary | ICD-10-CM

## 2023-06-27 DIAGNOSIS — C778 Secondary and unspecified malignant neoplasm of lymph nodes of multiple regions: Secondary | ICD-10-CM | POA: Diagnosis not present

## 2023-06-27 DIAGNOSIS — C7951 Secondary malignant neoplasm of bone: Secondary | ICD-10-CM | POA: Diagnosis not present

## 2023-06-27 MED ORDER — DEXAMETHASONE 4 MG PO TABS: ORAL_TABLET | ORAL | 1 refills | Status: DC

## 2023-06-27 MED ORDER — ONDANSETRON HCL 8 MG PO TABS: 8.0000 mg | ORAL_TABLET | Freq: Three times a day (TID) | ORAL | 1 refills | Status: DC | PRN

## 2023-06-27 MED ORDER — PROCHLORPERAZINE MALEATE 10 MG PO TABS: 10.0000 mg | ORAL_TABLET | Freq: Four times a day (QID) | ORAL | 1 refills | Status: DC | PRN

## 2023-06-28 ENCOUNTER — Inpatient Hospital Stay: Payer: Medicare PPO

## 2023-06-28 DIAGNOSIS — Z79899 Other long term (current) drug therapy: Secondary | ICD-10-CM | POA: Insufficient documentation

## 2023-06-28 DIAGNOSIS — Z5111 Encounter for antineoplastic chemotherapy: Secondary | ICD-10-CM | POA: Insufficient documentation

## 2023-06-28 DIAGNOSIS — C61 Malignant neoplasm of prostate: Secondary | ICD-10-CM | POA: Insufficient documentation

## 2023-06-28 DIAGNOSIS — I7143 Infrarenal abdominal aortic aneurysm, without rupture: Secondary | ICD-10-CM | POA: Insufficient documentation

## 2023-06-28 DIAGNOSIS — C7951 Secondary malignant neoplasm of bone: Secondary | ICD-10-CM | POA: Insufficient documentation

## 2023-06-28 DIAGNOSIS — Z87891 Personal history of nicotine dependence: Secondary | ICD-10-CM | POA: Insufficient documentation

## 2023-06-28 DIAGNOSIS — E785 Hyperlipidemia, unspecified: Secondary | ICD-10-CM | POA: Insufficient documentation

## 2023-06-28 DIAGNOSIS — Z923 Personal history of irradiation: Secondary | ICD-10-CM | POA: Insufficient documentation

## 2023-06-28 DIAGNOSIS — I10 Essential (primary) hypertension: Secondary | ICD-10-CM | POA: Insufficient documentation

## 2023-06-28 DIAGNOSIS — Z5919 Other inadequate housing: Secondary | ICD-10-CM | POA: Insufficient documentation

## 2023-06-28 NOTE — Assessment & Plan Note (Signed)
baseline bone mineral density: 04/06/2023 Right Femur Neck  T= -1.8 osteopenia  calcium (1000-1200 mg daily from food and supplements) and vitamin D3 (1000 IU daily) Control and prevent diabetes Did not have time to address dental evaluation today exercises (30 minutes per day) Limit alcohol consumption and avoid smoking Monitoring control blood pressure

## 2023-06-28 NOTE — Assessment & Plan Note (Signed)
Continue ADT  Start enzalutamide in Oct. Given dizziness now switching to darolutamide.  He is on low-dose simvastatin, will monitor for any toxicity. Discussed docetaxel. Given the AAA, discuss potential rupture which we cannot predict. After discussion will use lower dose also with his age Return to see me with PSA, testosterone, CMP and CBC in 2 weeks as scheduled for docetaxel. Radiation completed on L hip/pelvis 30 Gy/10 fx

## 2023-06-28 NOTE — Assessment & Plan Note (Signed)
 Appreciate our Social worker helping with arrangement for home decontamination and treatment.

## 2023-06-28 NOTE — Progress Notes (Signed)
Patient Care Team: Octavia Heir, NP as PCP - General (Adult Health Nurse Practitioner) Antony Contras, MD as Consulting Physician (Ophthalmology) Octavia Heir, NP as Nurse Practitioner (Adult Health Nurse Practitioner) Cherlyn Cushing, RN as Oncology Nurse Navigator  Clinic Day:  06/29/2023  Referring physician: Melven Sartorius, MD  ASSESSMENT & PLAN:   Assessment & Plan: David [name] is a 77 y.o. man with history of hypertension, hyperlipidemia here for follow-up prostate cancer   Current Diagnosis: mHSPC Initial diagnosis: 08/2022. mHSPC with lymph node, bone, lung metastases. Current Treatment: ADT, darolutamide. Genetic testing for metastatic prostate cancer: neg. 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   Somatic testing, no actionable mutation.  Variant of SF3B1, TP53, CTNNB1. pMMR. TMB 3.7. No HRR mutation.   David [name] is ready for chemotherapy today.  He is in the process of obtaining darolutamide.  He was transitioned to darolutamide when he gets the new pills.  In the meantime, he will continue enzalutamide.   Prostate cancer metastatic to bone [facility]) Continue ADT  Start enzalutamide in Oct. Given dizziness now switching to darolutamide.  He is on low-dose simvastatin, will monitor for any toxicity. Discussed docetaxel. Given the AAA, discuss potential rupture which we cannot predict. After discussion will use lower dose also with his age Return to see me with PSA, testosterone, CMP and CBC in 2 weeks as scheduled for docetaxel. Radiation completed on L hip/pelvis 30 Gy/10 fx  At risk for side effect of medication baseline bone mineral density: 04/06/2023 Right Femur Neck  T= -1.8 osteopenia  calcium (1000-1200 mg daily from food and supplements) and vitamin D3 (1000 IU daily) Control and prevent diabetes Did not have time to address dental evaluation today exercises (30 minutes per day) Limit alcohol consumption and avoid  smoking Monitoring control blood pressure  Infestation by bed bug Appreciate our Social worker helping with arrangement for home decontamination and treatment.   The patient understands the plans discussed today and is in agreement with them.  He knows to contact our office if he develops concerns prior to his next appointment.  He will return to see me as scheduled  Melven Sartorius, MD  [facility] - A DEPT [facility] [address] [place] [address] Dept: [phone] Dept Fax: [phone]   No orders of the defined types were placed in this encounter.     CHIEF COMPLAINT:  CC: Metastatic prostate cancer  INTERVAL HISTORY:  David [name] is here today for repeat clinical assessment.  No clinical changes.  Reported good urine output.  I have reviewed the past medical history, past surgical history, social history and family history with the patient and they are unchanged from previous note.  ALLERGIES:  is allergic to oxycontin [oxycodone hcl], penicillins, celebrex [celecoxib], erythromycin, and tetracyclines & related.  MEDICATIONS:  Current Outpatient Medications  Medication Sig Dispense Refill   acetaminophen (TYLENOL) 325 MG tablet Take 650 mg by mouth every 6 (six) hours as needed (pain and sleep aid).     cetirizine (ZYRTEC) 10 MG tablet Take 1 tablet (10 mg total) by mouth daily. 30 tablet 11   darolutamide (NUBEQA) 300 MG tablet Take 2 tablets (600 mg total) by mouth 2 (two) times daily with a meal. 120 tablet 11   dexamethasone (DECADRON) 4 MG tablet Take 2 tabs by mouth 2 times daily starting day before chemo. Then take 2 tabs  daily for 2 days starting day after chemo. Take with food. 30 tablet 1   finasteride (PROSCAR) 5 MG tablet TAKE 1 TABLET(5 MG) BY MOUTH DAILY 90 tablet 3   losartan (COZAAR) 100 MG tablet TAKE 1 TABLET BY MOUTH DAILY 90 tablet 3   ondansetron (ZOFRAN) 8 MG tablet Take 1  tablet (8 mg total) by mouth every 8 (eight) hours as needed for nausea or vomiting. 30 tablet 1   prochlorperazine (COMPAZINE) 10 MG tablet Take 1 tablet (10 mg total) by mouth every 6 (six) hours as needed for nausea or vomiting. 30 tablet 1   silodosin (RAPAFLO) 8 MG CAPS capsule Take 8 mg by mouth daily with breakfast.     simvastatin (ZOCOR) 20 MG tablet TAKE 1 TABLET(20 MG) BY MOUTH DAILY AT 6 PM 90 tablet 1   traZODone (DESYREL) 100 MG tablet TAKE 1 TABLET(100 MG) BY MOUTH AT BEDTIME 90 tablet 1   triamcinolone cream (KENALOG) 0.1 % Apply 1 Application topically 2 (two) times daily. 30 g 4   No current facility-administered medications for this visit.   Facility-Administered Medications Ordered in Other Visits  Medication Dose Route Frequency Provider Last Rate Last Admin   0.9 %  sodium chloride infusion   Intravenous Continuous Melven Sartorius, MD 10 mL/hr at 06/29/23 1036 New Bag at 06/29/23 1036   DOCEtaxel (TAXOTERE) 64 mg in sodium chloride 0.9 % 150 mL chemo infusion  30 mg/m2 (Treatment Plan Recorded) Intravenous Once Melven Sartorius, MD       heparin lock flush 100 unit/mL  500 Units Intracatheter Once PRN Melven Sartorius, MD       sodium chloride flush (NS) 0.9 % injection 10 mL  10 mL Intracatheter PRN Melven Sartorius, MD        HISTORY OF PRESENT ILLNESS:   Oncology History  Prostate cancer metastatic to bone [facility])  08/2022 Surgery   TURP for BPH: Prostatic adenocarcinoma, Gleason score 4+5=9 (grade group5) involves 80% of the resected tissue.  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    08/2022 Initial Diagnosis   Prostate cancer metastatic to bone (HCC)   09/28/2022 Tumor Marker   PSA 33.5   10/30/2022 Imaging   PSMA PET IMPRESSION: 1. There is diffuse radiotracer uptake throughout the prostate gland compatible with  residual/recurrent prostate cancer. 2. Bilateral tracer avid pelvic lymph nodes compatible with nodal metastasis. 3. Multifocal tracer avid bone metastases. 4. Solitary tracer avid pulmonary nodule within the right upper lobe is identified. 5. 5.2 cm infrarenal abdominal aortic aneurysm. Recommend follow-up every 6 months and vascular consultation. This recommendation follows ACR consensus guidelines: White Paper of the ACR Incidental Findings Committee II on Vascular Findings. J Am Coll Radiol 2013; 10:789-794. Aortic aneurysm NOS (ICD10-I71.9). 6.  Aortic Atherosclerosis (ICD10-I70.0).   11/2022 -  Chemotherapy   started Orgovynx and converted to Eligard.   03/02/2023 Tumor Marker   PSA 14.8 testosterone 19.7. alk phos 77   04/09/2023 Genetic Testing   Negative CustomNext-Cancer +RNAinsight Panel.  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  Report date is 04/09/2023.   The CustomNext-Cancer+RNAinsight panel offered by [facility] includes sequencing, rearrangement, and RNA analysis for the following 47 genes:  APC, ATM, AXIN2, BAP1, BARD1, BMPR1A, BRCA1, BRCA2, BRIP1, CDH1, CDK4, CDKN2A, CHEK2, CTNNA1, DICER1, EPCAM, FH, GREM1, HOXB13, KIT, MEN1, MLH1, MSH2, MSH3, MSH6, MUTYH, NF1,  NTHL1, PALB2, PDGFRA, PMS2, POLD1, POLE, PTEN, RAD51C, RAD51D, SDHA, SDHB, SDHC, SDHD, SMAD4, SMARCA4, STK11, TP53, TSC1, TSC2, VHL.     04/2023 -  Chemotherapy   Started Xtandi 160 mg.   06/29/2023 -  Chemotherapy   Patient is on Treatment Plan : PROSTATE Docetaxel (50) q14d         REVIEW OF SYSTEMS:   All relevant systems were reviewed with the patient and are negative.   VITALS:  There were no vitals taken for this visit.  Wt Readings from Last 3 Encounters:  05/28/23 206 lb 12.8 oz (93.8 kg)  05/16/23 209 lb 8 oz (95 kg)  05/03/23 212 lb 9.6 oz (96.4 kg)    There is no height or weight on file to calculate BMI.  Performance status (ECOG): 1 - Symptomatic but completely ambulatory  PHYSICAL  EXAM:   GENERAL:alert, no distress and comfortable LUNGS: normal breathing effort.    LABORATORY DATA:  I have reviewed the data as listed    Component Value Date/Time   NA 137 06/29/2023 0934   NA 138 11/03/2015 0835   K 3.9 06/29/2023 0934   CL 104 06/29/2023 0934   CO2 25 06/29/2023 0934   GLUCOSE 134 (H) 06/29/2023 0934   BUN 15 06/29/2023 0934   BUN 16 11/03/2015 0835   CREATININE 0.60 (L) 06/29/2023 0934   CREATININE 0.71 12/25/2022 0825   CALCIUM 9.5 06/29/2023 0934   PROT 7.4 06/29/2023 0934   PROT 7.1 11/03/2015 0835   ALBUMIN 4.2 06/29/2023 0934   ALBUMIN 4.4 11/03/2015 0835   AST 14 (L) 06/29/2023 0934   ALT 11 06/29/2023 0934   ALKPHOS 58 06/29/2023 0934   BILITOT 0.4 06/29/2023 0934   GFRNONAA >60 06/29/2023 0934   GFRNONAA 87 11/12/2020 1158   GFRAA 101 11/12/2020 1158    No results found for: "SPEP", "UPEP"  Lab Results  Component Value Date   WBC 5.3 06/29/2023   NEUTROABS 3.9 06/29/2023   HGB 14.2 06/29/2023   HCT 41.7 06/29/2023   MCV 91.9 06/29/2023   PLT 212 06/29/2023      Chemistry      Component Value Date/Time   NA 137 06/29/2023 0934   NA 138 11/03/2015 0835   K 3.9 06/29/2023 0934   CL 104 06/29/2023 0934   CO2 25 06/29/2023 0934   BUN 15 06/29/2023 0934   BUN 16 11/03/2015 0835   CREATININE 0.60 (L) 06/29/2023 0934   CREATININE 0.71 12/25/2022 0825      Component Value Date/Time   CALCIUM 9.5 06/29/2023 0934   ALKPHOS 58 06/29/2023 0934   AST 14 (L) 06/29/2023 0934   ALT 11 06/29/2023 0934   BILITOT 0.4 06/29/2023 0934       RADIOGRAPHIC STUDIES: I have personally reviewed the radiological images as listed and agreed with the findings in the report. No results found.

## 2023-06-28 NOTE — Telephone Encounter (Addendum)
Oral Oncology Patient Advocate Encounter   Received notification that the application for assistance for Nubeqa through [employer] has been approved.   [employer] phone number [phone].   Effective dates: 06/28/23 through 07/09/24  I have spoken to the patient.  [name], CPhT-Adv Oncology Pharmacy Patient Advocate [facility] Direct Number: [phone]  Fax: [phone]

## 2023-06-28 NOTE — Telephone Encounter (Signed)
Oral Chemotherapy Pharmacist Encounter   Patient Education I spoke with patient in clinic for overview of new oral chemotherapy medication: Nubeqa (darolutamide) for the treatment of metastatic, hormone sensitive, prostate cancer in conjunction with docetaxel, planned duration for Nubeqa until disease progression or unacceptable drug toxicity.   Pt is doing well. Counseled patient on administration, dosing, side effects, monitoring, drug-food interactions, safe handling, storage, and disposal. Prescription dose and frequency assessed for appropriateness.    Patient will take Nubeqa 300 mg tablet, 2 tablets (600 mg total) by mouth 2 (two) times daily with a meal. Patient knows to avoid grapefruit and grapefruit juice while on Nubeqa.   Start date: pending shipment; docetaxel start date: 06/29/23   Side effects include but not limited to: changes in LFTs, decreased blood counts, fatigue.     Reviewed with patient importance of keeping a medication schedule and plan for any missed doses. Patient made aware to stop the xtandi at this time.    After discussion with patient no patient barriers to medication adherence identified.  Patient voiced understanding and appreciation. All questions answered. Medication handout provided in clinic. Patient is receiving medication from [employer] patient assistance program.    Provided patient with Oral Chemotherapy Navigation Clinic phone number. Patient knows to call the office with questions or concerns.  David [name], PharmD Hematology/Oncology Clinical Pharmacist [facility] Oral Chemotherapy Navigation Clinic [phone] 06/28/2023 9:23 AM

## 2023-06-29 ENCOUNTER — Inpatient Hospital Stay: Payer: Medicare PPO

## 2023-06-29 ENCOUNTER — Inpatient Hospital Stay (HOSPITAL_BASED_OUTPATIENT_CLINIC_OR_DEPARTMENT_OTHER): Payer: Medicare PPO

## 2023-06-29 VITALS — BP 138/80 | HR 64 | Temp 98.1°F | Resp 16

## 2023-06-29 DIAGNOSIS — I7143 Infrarenal abdominal aortic aneurysm, without rupture: Secondary | ICD-10-CM | POA: Diagnosis not present

## 2023-06-29 DIAGNOSIS — I10 Essential (primary) hypertension: Secondary | ICD-10-CM | POA: Diagnosis not present

## 2023-06-29 DIAGNOSIS — B888 Other specified infestations: Secondary | ICD-10-CM

## 2023-06-29 DIAGNOSIS — C7951 Secondary malignant neoplasm of bone: Secondary | ICD-10-CM

## 2023-06-29 DIAGNOSIS — C61 Malignant neoplasm of prostate: Secondary | ICD-10-CM | POA: Diagnosis not present

## 2023-06-29 DIAGNOSIS — E785 Hyperlipidemia, unspecified: Secondary | ICD-10-CM | POA: Diagnosis not present

## 2023-06-29 DIAGNOSIS — Z923 Personal history of irradiation: Secondary | ICD-10-CM | POA: Diagnosis not present

## 2023-06-29 DIAGNOSIS — Z5919 Other inadequate housing: Secondary | ICD-10-CM | POA: Diagnosis not present

## 2023-06-29 DIAGNOSIS — Z9189 Other specified personal risk factors, not elsewhere classified: Secondary | ICD-10-CM | POA: Diagnosis not present

## 2023-06-29 DIAGNOSIS — Z5111 Encounter for antineoplastic chemotherapy: Secondary | ICD-10-CM | POA: Diagnosis not present

## 2023-06-29 DIAGNOSIS — Z79899 Other long term (current) drug therapy: Secondary | ICD-10-CM | POA: Diagnosis not present

## 2023-06-29 DIAGNOSIS — Z87891 Personal history of nicotine dependence: Secondary | ICD-10-CM | POA: Diagnosis not present

## 2023-06-29 LAB — CMP (CANCER CENTER ONLY)
ALT: 11 U/L (ref 0–44)
AST: 14 U/L — ABNORMAL LOW (ref 15–41)
Albumin: 4.2 g/dL (ref 3.5–5.0)
Alkaline Phosphatase: 58 U/L (ref 38–126)
Anion gap: 8 (ref 5–15)
BUN: 15 mg/dL (ref 8–23)
CO2: 25 mmol/L (ref 22–32)
Calcium: 9.5 mg/dL (ref 8.9–10.3)
Chloride: 104 mmol/L (ref 98–111)
Creatinine: 0.6 mg/dL — ABNORMAL LOW (ref 0.61–1.24)
GFR, Estimated: >60 mL/min (ref >60)
Glucose, Bld: 134 mg/dL — ABNORMAL HIGH (ref 70–99)
Potassium: 3.9 mmol/L (ref 3.5–5.1)
Sodium: 137 mmol/L (ref 135–145)
Total Bilirubin: 0.4 mg/dL (ref <1.2)
Total Protein: 7.4 g/dL (ref 6.5–8.1)

## 2023-06-29 LAB — CBC WITH DIFFERENTIAL (CANCER CENTER ONLY)
Abs Immature Granulocytes: 0.02 10*3/uL (ref 0.00–0.07)
Basophils Absolute: 0 10*3/uL (ref 0.0–0.1)
Basophils Relative: 0 %
Eosinophils Absolute: 0 10*3/uL (ref 0.0–0.5)
Eosinophils Relative: 1 %
HCT: 41.7 % (ref 39.0–52.0)
Hemoglobin: 14.2 g/dL (ref 13.0–17.0)
Immature Granulocytes: 0 %
Lymphocytes Relative: 15 %
Lymphs Abs: 0.8 10*3/uL (ref 0.7–4.0)
MCH: 31.3 pg (ref 26.0–34.0)
MCHC: 34.1 g/dL (ref 30.0–36.0)
MCV: 91.9 fL (ref 80.0–100.0)
Monocytes Absolute: 0.5 10*3/uL (ref 0.1–1.0)
Monocytes Relative: 9 %
Neutro Abs: 3.9 10*3/uL (ref 1.7–7.7)
Neutrophils Relative %: 75 %
Platelet Count: 212 10*3/uL (ref 150–400)
RBC: 4.54 MIL/uL (ref 4.22–5.81)
RDW: 13.8 % (ref 11.5–15.5)
WBC Count: 5.3 10*3/uL (ref 4.0–10.5)
nRBC: 0 % (ref 0.0–0.2)

## 2023-06-29 MED ORDER — DEXAMETHASONE SODIUM PHOSPHATE 10 MG/ML IJ SOLN
10.0000 mg | Freq: Once | INTRAMUSCULAR | Status: AC
  Administered 2023-06-29: 10 mg via INTRAVENOUS
  Filled 2023-06-29: qty 1

## 2023-06-29 MED ORDER — SODIUM CHLORIDE 0.9% FLUSH
10.0000 mL | INTRAVENOUS | Status: DC | PRN
Start: 2023-06-29 — End: 2023-06-29

## 2023-06-29 MED ORDER — HEPARIN SOD (PORK) LOCK FLUSH 100 UNIT/ML IV SOLN: 500.0000 [IU] | Freq: Once | INTRAVENOUS | Status: DC | PRN

## 2023-06-29 MED ORDER — DOCETAXEL CHEMO INJECTION 160 MG/16ML
30.0000 mg/m2 | Freq: Once | INTRAVENOUS | Status: AC
  Administered 2023-06-29: 64 mg via INTRAVENOUS
  Filled 2023-06-29: qty 6.4

## 2023-06-29 MED ORDER — SODIUM CHLORIDE 0.9 % IV SOLN: INTRAVENOUS | Status: DC

## 2023-06-29 NOTE — Patient Instructions (Signed)
 [facility] - A DEPT OF [facility]  Discharge Instructions: Thank you for choosing [facility] to provide your oncology and hematology care.   If you have a lab appointment with the Cancer Center, please go directly to the Cancer Center and check in at the registration area.   Wear comfortable clothing and clothing appropriate for easy access to any Portacath or PICC line.   We strive to give you quality time with your provider. You may need to reschedule your appointment if you arrive late (15 or more minutes).  Arriving late affects you and other patients whose appointments are after yours.  Also, if you miss three or more appointments without notifying the office, you may be dismissed from the clinic at the provider's discretion.      For prescription refill requests, have your pharmacy contact our office and allow 72 hours for refills to be completed.    Today you received the following chemotherapy and/or immunotherapy agents:Taxotere      To help prevent nausea and vomiting after your treatment, we encourage you to take your nausea medication as directed.  BELOW ARE SYMPTOMS THAT SHOULD BE REPORTED IMMEDIATELY: *FEVER GREATER THAN 100.4 F (38 C) OR HIGHER *CHILLS OR SWEATING *NAUSEA AND VOMITING THAT IS NOT CONTROLLED WITH YOUR NAUSEA MEDICATION *UNUSUAL SHORTNESS OF BREATH *UNUSUAL BRUISING OR BLEEDING *URINARY PROBLEMS (pain or burning when urinating, or frequent urination) *BOWEL PROBLEMS (unusual diarrhea, constipation, pain near the anus) TENDERNESS IN MOUTH AND THROAT WITH OR WITHOUT PRESENCE OF ULCERS (sore throat, sores in mouth, or a toothache) UNUSUAL RASH, SWELLING OR PAIN  UNUSUAL VAGINAL DISCHARGE OR ITCHING   Items with * indicate a potential emergency and should be followed up as soon as possible or go to the Emergency Department if any problems should occur.  Please show the CHEMOTHERAPY ALERT CARD or IMMUNOTHERAPY  ALERT CARD at check-in to the Emergency Department and triage nurse.  Should you have questions after your visit or need to cancel or reschedule your appointment, please contact [facility] - [facility]  Dept: [phone]  and follow the prompts.  Office hours are 8:00 a.m. to 4:30 p.m. Monday - Friday. Please note that voicemails left after 4:00 p.m. may not be returned until the following business day.  We are closed weekends and major holidays. You have access to a nurse at all times for urgent questions. Please call the main number to the clinic Dept: [phone] and follow the prompts.   For any non-urgent questions, you may also contact your provider using MyChart. We now offer e-Visits for anyone 5 and older to request care online for non-urgent symptoms. For details visit [email or web address].   Also download the MyChart app! Go to the app store, search "MyChart", open the app, select [place], and log in with your MyChart username and password.  Docetaxel Injection What is this medication? DOCETAXEL (doe se TAX el) treats some types of cancer. It works by slowing down the growth of cancer cells. This medicine may be used for other purposes; ask your health care provider or pharmacist if you have questions. COMMON BRAND NAME(S): Docefrez, Docivyx, Taxotere What should I tell my care team before I take this medication? They need to know if you have any of these conditions: Kidney disease Liver disease Low white blood cell levels Tingling of the fingers or toes or other nerve disorder  An unusual or allergic reaction to docetaxel, polysorbate 80, other medications, foods, dyes, or preservatives Pregnant or trying to get pregnant Breast-feeding How should I use this medication? This medication is injected into a vein. It is given by your care team in a hospital or clinic setting. Talk to your care team about the use of this medication  in children. Special care may be needed. Overdosage: If you think you have taken too much of this medicine contact a poison control center or emergency room at once. NOTE: This medicine is only for you. Do not share this medicine with others. What if I miss a dose? Keep appointments for follow-up doses. It is important not to miss your dose. Call your care team if you are unable to keep an appointment. What may interact with this medication? Do not take this medication with any of the following: Live virus vaccines This medication may also interact with the following: Certain antibiotics, such as clarithromycin, telithromycin Certain antivirals for HIV or hepatitis Certain medications for fungal infections, such as itraconazole, ketoconazole, voriconazole Grapefruit juice Nefazodone Supplements, such as St. John's wort This list may not describe all possible interactions. Give your health care provider a list of all the medicines, herbs, non-prescription drugs, or dietary supplements you use. Also tell them if you smoke, drink alcohol, or use illegal drugs. Some items may interact with your medicine. What should I watch for while using this medication? This medication may make you feel generally unwell. This is not uncommon as chemotherapy can affect healthy cells as well as cancer cells. Report any side effects. Continue your course of treatment even though you feel ill unless your care team tells you to stop. You may need blood work done while you are taking this medication. This medication can cause serious side effects and infusion reactions. To reduce the risk, your care team may give you other medications to take before receiving this one. Be sure to follow the directions from your care team. This medication may increase your risk of getting an infection. Call your care team for advice if you get a fever, chills, sore throat, or other symptoms of a cold or flu. Do not treat yourself. Try to  avoid being around people who are sick. Avoid taking medications that contain aspirin, acetaminophen, ibuprofen, naproxen, or ketoprofen unless instructed by your care team. These medications may hide a fever. Be careful brushing or flossing your teeth or using a toothpick because you may get an infection or bleed more easily. If you have any dental work done, tell your dentist you are receiving this medication. Some products may contain alcohol. Ask your care team if this medication contains alcohol. Be sure to tell all care teams you are taking this medicine. Certain medications, like metronidazole and disulfiram, can cause an unpleasant reaction when taken with alcohol. The reaction includes flushing, headache, nausea, vomiting, sweating, and increased thirst. The reaction can last from 30 minutes to several hours. This medication may affect your coordination, reaction time, or judgement. Do not drive or operate machinery until you know how this medication affects you. Sit up or stand slowly to reduce the risk of dizzy or fainting spells. Drinking alcohol with this medication can increase the risk of these side effects. Talk to your care team about your risk of cancer. You may be more at risk for certain types of cancer if you take this medication. Talk to your care team if you wish to become pregnant or think you  might be pregnant. This medication can cause serious birth defects if taken during pregnancy or if you get pregnant within 2 months after stopping therapy. A negative pregnancy test is required before starting this medication. A reliable form of contraception is recommended while taking this medication and for 2 months after stopping it. Talk to your care team about reliable forms of contraception. Do not breast-feed while taking this medication and for 1 week after stopping therapy. Use a condom during sex and for 4 months after stopping therapy. Tell your care team right away if you think your  partner might be pregnant. This medication can cause serious birth defects. This medication may cause infertility. Talk to your care team if you are concerned about your fertility. What side effects may I notice from receiving this medication? Side effects that you should report to your care team as soon as possible: Allergic reactions--skin rash, itching, hives, swelling of the face, lips, tongue, or throat Change in vision such as blurry vision, seeing halos around lights, vision loss Infection--fever, chills, cough, or sore throat Infusion reactions--chest pain, shortness of breath or trouble breathing, feeling faint or lightheaded Low red blood cell level--unusual weakness or fatigue, dizziness, headache, trouble breathing Pain, tingling, or numbness in the hands or feet Painful swelling, warmth, or redness of the skin, blisters or sores at the infusion site Redness, blistering, peeling, or loosening of the skin, including inside the mouth Sudden or severe stomach pain, bloody diarrhea, fever, nausea, vomiting Swelling of the ankles, hands, or feet Tumor lysis syndrome (TLS)--nausea, vomiting, diarrhea, decrease in the amount of urine, dark urine, unusual weakness or fatigue, confusion, muscle pain or cramps, fast or irregular heartbeat, joint pain Unusual bruising or bleeding Side effects that usually do not require medical attention (report to your care team if they continue or are bothersome): Change in nail shape, thickness, or color Change in taste Hair loss Increased tears This list may not describe all possible side effects. Call your doctor for medical advice about side effects. You may report side effects to FDA at 1-800-FDA-1088. Where should I keep my medication? This medication is given in a hospital or clinic. It will not be stored at home. NOTE: This sheet is a summary. It may not cover all possible information. If you have questions about this medicine, talk to your doctor,  pharmacist, or health care provider.  2024 Elsevier/Gold Standard (2021-09-01 00:00:00)

## 2023-06-30 LAB — PSA, TOTAL AND FREE
PSA, Free Pct: 26.5 %
PSA, Free: 0.45 ng/mL
Prostate Specific Ag, Serum: 1.7 ng/mL (ref 0.0–4.0)

## 2023-06-30 LAB — TESTOSTERONE: Testosterone: <3 ng/dL — ABNORMAL LOW (ref 264–916)

## 2023-07-01 ENCOUNTER — Other Ambulatory Visit: Payer: Self-pay

## 2023-07-02 ENCOUNTER — Other Ambulatory Visit: Payer: Self-pay | Admitting: Orthopedic Surgery

## 2023-07-02 ENCOUNTER — Telehealth: Payer: Self-pay

## 2023-07-02 ENCOUNTER — Other Ambulatory Visit: Payer: Self-pay | Admitting: Internal Medicine

## 2023-07-02 ENCOUNTER — Inpatient Hospital Stay: Payer: Medicare PPO

## 2023-07-02 DIAGNOSIS — E781 Pure hyperglyceridemia: Secondary | ICD-10-CM

## 2023-07-02 NOTE — Telephone Encounter (Signed)
Earlier this AM, pt showed up for Udenyca injection, but no orders were in for this medication. Consulted Dr. Cherly Hensen, who stated that pt does not need Udenyca. Informed pt in the lobby, and he asked for a copy of his upcoming schedule. This was printed and handed to him, and he stated that Dr. Cherly Hensen told him he did not need any additional infusions, but these appts were on his schedule. Advised that Dr. Cherly Hensen would be consulted. After talking w/ Dr. Cherly Hensen, it is confirmed that pt does not need any additional infusion or injection appts. He states that pt only needs the currently scheduled lab and OV appts on 07/27/23. TC to inform pt of this. He verbalizes understanding that all appts are now cancelled except for 1/17 appts, but asks that a calendar be mailed to him. Printed out a patient calendar/schedule for January and placed in outgoing mailbox.

## 2023-07-02 NOTE — Telephone Encounter (Signed)
-----   Message from Nurse Kasandra Knudsen sent at 06/29/2023  2:40 PM EST ----- Regarding: David [name]- First time Taxotere Patient tolerated treatment well

## 2023-07-02 NOTE — Telephone Encounter (Signed)
 LM for patient that this nurse was calling to see how they were doing after their treatment. Please call back to Dr. Nelta Numbers nurse at [phone] if they have any questions or concerns regarding the treatment.

## 2023-07-03 ENCOUNTER — Other Ambulatory Visit: Payer: Self-pay | Admitting: *Deleted

## 2023-07-03 NOTE — Progress Notes (Signed)
RN left message for patient to call back to review status of medication delivery, N[facility].

## 2023-07-06 NOTE — Progress Notes (Signed)
RN spoke with patient and confirmed he received medication, Darolutamide, on 12/24 and started taking on 12/25.  RN reviewed how patient is taking medication, confirmed appropriate instructions.  RN also reinforced that he will no longer need to continue with Xtandi, verbalized understanding.    Patient has not yet been contacted by [employer], will reach out to LCSW to ensure process remains active.   Patient encouraged to call with any questions.

## 2023-07-13 ENCOUNTER — Ambulatory Visit: Payer: Medicare PPO

## 2023-07-13 ENCOUNTER — Other Ambulatory Visit: Payer: Medicare PPO

## 2023-07-16 ENCOUNTER — Ambulatory Visit: Payer: Medicare PPO

## 2023-07-18 ENCOUNTER — Telehealth: Payer: Self-pay

## 2023-07-18 DIAGNOSIS — R3914 Feeling of incomplete bladder emptying: Secondary | ICD-10-CM | POA: Diagnosis not present

## 2023-07-18 NOTE — Telephone Encounter (Signed)
 [facility] CSW Progress Note  Clinical Social Worker  received email from [employer] on 07/12/22 stating they attempted to reach patient  to assist with pest control. CSW attempted to reach patient to confirm if appointment has been set up.  Lizbeth Sprague, LCSW Clinical Social Worker [facility]

## 2023-07-23 ENCOUNTER — Telehealth: Payer: Self-pay

## 2023-07-23 ENCOUNTER — Inpatient Hospital Stay: Payer: Medicare PPO

## 2023-07-23 DIAGNOSIS — Z87891 Personal history of nicotine dependence: Secondary | ICD-10-CM | POA: Insufficient documentation

## 2023-07-23 DIAGNOSIS — Z5919 Other inadequate housing: Secondary | ICD-10-CM | POA: Insufficient documentation

## 2023-07-23 DIAGNOSIS — C61 Malignant neoplasm of prostate: Secondary | ICD-10-CM | POA: Insufficient documentation

## 2023-07-23 DIAGNOSIS — I7143 Infrarenal abdominal aortic aneurysm, without rupture: Secondary | ICD-10-CM | POA: Insufficient documentation

## 2023-07-23 DIAGNOSIS — Z923 Personal history of irradiation: Secondary | ICD-10-CM | POA: Insufficient documentation

## 2023-07-23 DIAGNOSIS — C7951 Secondary malignant neoplasm of bone: Secondary | ICD-10-CM | POA: Insufficient documentation

## 2023-07-23 DIAGNOSIS — I10 Essential (primary) hypertension: Secondary | ICD-10-CM | POA: Insufficient documentation

## 2023-07-23 DIAGNOSIS — E785 Hyperlipidemia, unspecified: Secondary | ICD-10-CM | POA: Insufficient documentation

## 2023-07-23 DIAGNOSIS — Z79899 Other long term (current) drug therapy: Secondary | ICD-10-CM | POA: Insufficient documentation

## 2023-07-23 NOTE — Progress Notes (Signed)
 [facility] CSW Progress Note  Addendum to note that Pest Treatment was provided due to patient meeting [employer] Eligibility based on income and active treatment.     Marguerita Merles, LCSW Clinical Social Worker [facility]

## 2023-07-23 NOTE — Telephone Encounter (Signed)
 Error

## 2023-07-23 NOTE — Progress Notes (Signed)
 [facility] CSW Progress Note  Clinical Child Psychotherapist contacted patient by phone to provide updates on [employer]. [employer] has approved the treatment, [employer] will schedule the treatment with patient. Patient is relieved and happy to hear that treatment has been approved. Medical Team has been updated.  Lizbeth Sprague, LCSW Clinical Social Worker [facility]

## 2023-07-25 DIAGNOSIS — L570 Actinic keratosis: Secondary | ICD-10-CM | POA: Diagnosis not present

## 2023-07-25 DIAGNOSIS — L02821 Furuncle of head [any part, except face]: Secondary | ICD-10-CM | POA: Diagnosis not present

## 2023-07-26 ENCOUNTER — Other Ambulatory Visit: Payer: Self-pay

## 2023-07-26 DIAGNOSIS — C61 Malignant neoplasm of prostate: Secondary | ICD-10-CM

## 2023-07-26 NOTE — Progress Notes (Signed)
Patient Care Team: Octavia Heir, NP as PCP - General (Adult Health Nurse Practitioner) Antony Contras, MD as Consulting Physician (Ophthalmology) Octavia Heir, NP as Nurse Practitioner (Adult Health Nurse Practitioner) Cherlyn Cushing, RN as Oncology Nurse Navigator  Clinic Day:  07/27/2023  Referring physician: Melven Sartorius, MD  ASSESSMENT & PLAN:   Assessment & Plan: David [name] is a 78 y.o. man with history of hypertension, hyperlipidemia here for follow-up prostate cancer   Current Diagnosis: mHSPC Initial diagnosis: 08/2022. mHSPC with lymph node, bone, lung metastases. Current Treatment: ADT, darolutamide. Genetic testing for metastatic prostate cancer: neg. 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   Somatic testing, no actionable mutation.  Variant of SF3B1, TP53, CTNNB1. pMMR. TMB 3.7. No HRR mutation.   He received Darolutamide on 12/24 and started taking on 07/04/23.  BP acceptable losartan.  He gets call for delivery.  ADT at [facility].  Recommend him to start taking vitamin D and calcium.  I wrote down the dose for him today.  Prostate cancer metastatic to bone [facility]) Continue ADT  Was on enzalutamide in Oct. Given dizziness now switching to darolutamide.  Started darolutamide on 12/25 He is on low-dose simvastatin, will monitor for any toxicity. Had one dose of docetaxel. Given the AAA, discuss potential rupture which we cannot predict. After discussion will use lower dose also with his age.  Return to see me with PSA, testosterone in about 6 weeks Radiation completed on L hip/pelvis 30 Gy/10 fx  At risk for side effect of medication baseline bone mineral density: 04/06/2023 Right Femur Neck  T= -1.8 osteopenia  calcium (1000-1200 mg daily from food and supplements) and vitamin D3 (1000 IU daily) Control and prevent diabetes Did not have time to address dental evaluation today exercises (30 minutes per day) Limit alcohol  consumption and avoid smoking Monitoring control blood pressure  Essential hypertension On losartan increased to 100 mg daily last month. Goal is <140/90    The patient understands the plans discussed today and is in agreement with them.  He knows to contact our office if he develops concerns prior to his next appointment.  Melven Sartorius, MD  [facility] - [facility]PT [facility] [address] [place] [address] Dept: [phone] Dept Fax: [phone]   Orders Placed This Encounter  Procedures   CBC with Differential (Cancer Center Only)    Standing Status:   Future    Expiration Date:   07/26/2024   CMP (Cancer Center only)    Standing Status:   Future    Expiration Date:   07/26/2024   Prostate-Specific AG, Serum    Standing Status:   Future    Expiration Date:   07/26/2024   Testosterone    Standing Status:   Future    Expiration Date:   07/26/2024      CHIEF COMPLAINT:  CC: HSPC  Current Treatment:  daro ADT  INTERVAL HISTORY:  David [name] is here today for repeat clinical assessment.   He reports feeling well overall.  Docetaxel went well for him.  Report of a few diarrhea and constipation that resolved spontaneously.  No other side effects.  He is waiting for bed back to be treated.  He has been taking his darolutamide.  No missing doses.  He got a phone call for next delivery.  [facility]  6 months for ADT.  I have reviewed the past medical history, past surgical history, social history and family history with the patient and they are unchanged from previous note.  ALLERGIES:  is allergic to oxycontin [oxycodone hcl], penicillins, celebrex [celecoxib], erythromycin, and tetracyclines & related.  MEDICATIONS:  Current Outpatient Medications  Medication Sig Dispense Refill   acetaminophen (TYLENOL) 325 MG tablet Take 650 mg by mouth every 6 (six) hours as needed  (pain and sleep aid).     cetirizine (ZYRTEC) 10 MG tablet Take 1 tablet (10 mg total) by mouth daily. 30 tablet 11   darolutamide (NUBEQA) 300 MG tablet Take 2 tablets (600 mg total) by mouth 2 (two) times daily with a meal. 120 tablet 11   dexamethasone (DECADRON) 4 MG tablet Take 2 tabs by mouth 2 times daily starting day before chemo. Then take 2 tabs daily for 2 days starting day after chemo. Take with food. 30 tablet 1   finasteride (PROSCAR) 5 MG tablet TAKE 1 TABLET(5 MG) BY MOUTH DAILY 90 tablet 3   losartan (COZAAR) 100 MG tablet TAKE 1 TABLET BY MOUTH DAILY 90 tablet 3   ondansetron (ZOFRAN) 8 MG tablet Take 1 tablet (8 mg total) by mouth every 8 (eight) hours as needed for nausea or vomiting. 30 tablet 1   prochlorperazine (COMPAZINE) 10 MG tablet Take 1 tablet (10 mg total) by mouth every 6 (six) hours as needed for nausea or vomiting. 30 tablet 1   silodosin (RAPAFLO) 8 MG CAPS capsule Take 8 mg by mouth daily with breakfast.     simvastatin (ZOCOR) 20 MG tablet TAKE 1 TABLET(20 MG) BY MOUTH DAILY AT 6 PM 90 tablet 1   traZODone (DESYREL) 100 MG tablet TAKE 1 TABLET(100 MG) BY MOUTH AT BEDTIME 90 tablet 1   triamcinolone cream (KENALOG) 0.1 % Apply 1 Application topically 2 (two) times daily. 30 g 4   No current facility-administered medications for this visit.    HISTORY OF PRESENT ILLNESS:   Oncology History  Prostate cancer metastatic to bone [facility])  08/2022 Surgery   TURP for BPH: Prostatic adenocarcinoma, Gleason score 4+5=9 (grade group5) involves 80% of the resected tissue.  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    08/2022 Initial Diagnosis   Prostate cancer metastatic to bone (HCC)   09/28/2022 Tumor Marker   PSA 33.5   10/30/2022 Imaging   PSMA PET IMPRESSION: 1. There is diffuse radiotracer uptake throughout the  prostate gland compatible with residual/recurrent prostate cancer. 2. Bilateral tracer avid pelvic lymph nodes compatible with nodal metastasis. 3. Multifocal tracer avid bone metastases. 4. Solitary tracer avid pulmonary nodule within the right upper lobe is identified. 5. 5.2 cm infrarenal abdominal aortic aneurysm. Recommend follow-up every 6 months and vascular consultation. This recommendation follows ACR consensus guidelines: White Paper of the ACR Incidental Findings Committee II on Vascular Findings. J Am Coll Radiol 2013; 10:789-794. Aortic aneurysm NOS (ICD10-I71.9). 6.  Aortic Atherosclerosis (ICD10-I70.0).   11/2022 -  Chemotherapy   started Orgovynx and converted to Eligard.   03/02/2023 Tumor Marker   PSA 14.8 testosterone 19.7. alk phos 77   04/09/2023 Genetic Testing   Negative CustomNext-Cancer +RNAinsight Panel.  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  Report date is 04/09/2023.   The CustomNext-Cancer+RNAinsight panel offered by [facility] includes sequencing, rearrangement, and RNA analysis for the following 47  genes:  APC, ATM, AXIN2, BAP1, BARD1, BMPR1A, BRCA1, BRCA2, BRIP1, CDH1, CDK4, CDKN2A, CHEK2, CTNNA1, DICER1, EPCAM, FH, GREM1, HOXB13, KIT, MEN1, MLH1, MSH2, MSH3, MSH6, MUTYH, NF1, NTHL1, PALB2, PDGFRA, PMS2, POLD1, POLE, PTEN, RAD51C, RAD51D, SDHA, SDHB, SDHC, SDHD, SMAD4, SMARCA4, STK11, TP53, TSC1, TSC2, VHL.     04/2023 -  Chemotherapy   Started Xtandi 160 mg.   06/29/2023 -  Chemotherapy   Patient is on Treatment Plan : PROSTATE Docetaxel (50) q14d         REVIEW OF SYSTEMS:   All relevant systems were reviewed with the patient and are negative.   VITALS:  Blood pressure 138/89, pulse (!) 53, temperature 97.7 F (36.5 C), temperature source Temporal, resp. rate 16, weight 204 lb 1.6 oz (92.6 kg), SpO2 100%.  Wt Readings from Last 3 Encounters:  07/27/23 204 lb 1.6 oz (92.6 kg)  05/28/23 206 lb 12.8 oz (93.8 kg)  05/16/23 209 lb 8 oz (95 kg)     Body mass index is 31.03 kg/m.  Performance status (ECOG): 2 - Symptomatic, <50% confined to bed  PHYSICAL EXAM:   GENERAL:alert, no distress and comfortable SKIN:   rashes    LABORATORY DATA:  I have reviewed the data as listed    Component Value Date/Time   NA 140 07/27/2023 1050   NA 138 11/03/2015 0835   K 4.9 07/27/2023 1050   CL 106 07/27/2023 1050   CO2 28 07/27/2023 1050   GLUCOSE 140 (H) 07/27/2023 1050   BUN 17 07/27/2023 1050   BUN 16 11/03/2015 0835   CREATININE 0.64 07/27/2023 1050   CREATININE 0.71 12/25/2022 0825   CALCIUM 9.3 07/27/2023 1050   PROT 7.4 07/27/2023 1050   PROT 7.1 11/03/2015 0835   ALBUMIN 4.2 07/27/2023 1050   ALBUMIN 4.4 11/03/2015 0835   AST 13 (L) 07/27/2023 1050   ALT 7 07/27/2023 1050   ALKPHOS 56 07/27/2023 1050   BILITOT 0.4 07/27/2023 1050   GFRNONAA >60 07/27/2023 1050   GFRNONAA 87 11/12/2020 1158   GFRAA 101 11/12/2020 1158    No results found for: "SPEP", "UPEP"  Lab Results  Component Value Date   WBC 4.3 07/27/2023   NEUTROABS 2.9 07/27/2023   HGB 13.9 07/27/2023   HCT 42.3 07/27/2023   MCV 95.9 07/27/2023   PLT 180 07/27/2023      Chemistry      Component Value Date/Time   NA 140 07/27/2023 1050   NA 138 11/03/2015 0835   K 4.9 07/27/2023 1050   CL 106 07/27/2023 1050   CO2 28 07/27/2023 1050   BUN 17 07/27/2023 1050   BUN 16 11/03/2015 0835   CREATININE 0.64 07/27/2023 1050   CREATININE 0.71 12/25/2022 0825      Component Value Date/Time   CALCIUM 9.3 07/27/2023 1050   ALKPHOS 56 07/27/2023 1050   AST 13 (L) 07/27/2023 1050   ALT 7 07/27/2023 1050   BILITOT 0.4 07/27/2023 1050       RADIOGRAPHIC STUDIES: I have personally reviewed the radiological images as listed and agreed with the findings in the report. No results found.

## 2023-07-26 NOTE — Assessment & Plan Note (Signed)
 baseline bone mineral density: 04/06/2023 Right Femur Neck  T= -1.8 osteopenia  calcium (1000-1200 mg daily from food and supplements) and vitamin D3 (1000 IU daily) Control and prevent diabetes Did not have time to address dental evaluation today exercises (30 minutes per day) Limit alcohol consumption and avoid smoking Monitoring control blood pressure

## 2023-07-26 NOTE — Assessment & Plan Note (Addendum)
Continue ADT  Was on enzalutamide in Oct. Given dizziness now switching to darolutamide.  Started darolutamide on 12/25 He is on low-dose simvastatin, will monitor for any toxicity. Had one dose of docetaxel. Given the AAA, discuss potential rupture which we cannot predict. After discussion will use lower dose also with his age.  Return to see me with PSA, testosterone in about 6 weeks Radiation completed on L hip/pelvis 30 Gy/10 fx

## 2023-07-27 ENCOUNTER — Inpatient Hospital Stay: Payer: Medicare PPO

## 2023-07-27 ENCOUNTER — Ambulatory Visit: Payer: Medicare PPO

## 2023-07-27 ENCOUNTER — Inpatient Hospital Stay (HOSPITAL_BASED_OUTPATIENT_CLINIC_OR_DEPARTMENT_OTHER): Payer: Medicare PPO

## 2023-07-27 ENCOUNTER — Telehealth: Payer: Self-pay

## 2023-07-27 VITALS — BP 138/89 | HR 72 | Temp 97.7°F | Resp 16 | Wt 204.1 lb

## 2023-07-27 DIAGNOSIS — Z5919 Other inadequate housing: Secondary | ICD-10-CM | POA: Diagnosis not present

## 2023-07-27 DIAGNOSIS — C7951 Secondary malignant neoplasm of bone: Secondary | ICD-10-CM | POA: Diagnosis not present

## 2023-07-27 DIAGNOSIS — Z79899 Other long term (current) drug therapy: Secondary | ICD-10-CM | POA: Diagnosis not present

## 2023-07-27 DIAGNOSIS — Z9189 Other specified personal risk factors, not elsewhere classified: Secondary | ICD-10-CM

## 2023-07-27 DIAGNOSIS — C61 Malignant neoplasm of prostate: Secondary | ICD-10-CM

## 2023-07-27 DIAGNOSIS — Z923 Personal history of irradiation: Secondary | ICD-10-CM | POA: Diagnosis not present

## 2023-07-27 DIAGNOSIS — I10 Essential (primary) hypertension: Secondary | ICD-10-CM

## 2023-07-27 DIAGNOSIS — E785 Hyperlipidemia, unspecified: Secondary | ICD-10-CM | POA: Diagnosis not present

## 2023-07-27 DIAGNOSIS — Z87891 Personal history of nicotine dependence: Secondary | ICD-10-CM | POA: Diagnosis not present

## 2023-07-27 DIAGNOSIS — I7143 Infrarenal abdominal aortic aneurysm, without rupture: Secondary | ICD-10-CM | POA: Diagnosis not present

## 2023-07-27 LAB — CBC WITH DIFFERENTIAL (CANCER CENTER ONLY)
Abs Immature Granulocytes: 0.02 10*3/uL (ref 0.00–0.07)
Basophils Absolute: 0 10*3/uL (ref 0.0–0.1)
Basophils Relative: 1 %
Eosinophils Absolute: 0 10*3/uL (ref 0.0–0.5)
Eosinophils Relative: 1 %
HCT: 42.3 % (ref 39.0–52.0)
Hemoglobin: 13.9 g/dL (ref 13.0–17.0)
Immature Granulocytes: 1 %
Lymphocytes Relative: 21 %
Lymphs Abs: 0.9 10*3/uL (ref 0.7–4.0)
MCH: 31.5 pg (ref 26.0–34.0)
MCHC: 32.9 g/dL (ref 30.0–36.0)
MCV: 95.9 fL (ref 80.0–100.0)
Monocytes Absolute: 0.4 10*3/uL (ref 0.1–1.0)
Monocytes Relative: 9 %
Neutro Abs: 2.9 10*3/uL (ref 1.7–7.7)
Neutrophils Relative %: 67 %
Platelet Count: 180 10*3/uL (ref 150–400)
RBC: 4.41 MIL/uL (ref 4.22–5.81)
RDW: 14.5 % (ref 11.5–15.5)
WBC Count: 4.3 10*3/uL (ref 4.0–10.5)
nRBC: 0 % (ref 0.0–0.2)

## 2023-07-27 LAB — CMP (CANCER CENTER ONLY)
ALT: 7 U/L (ref 0–44)
AST: 13 U/L — ABNORMAL LOW (ref 15–41)
Albumin: 4.2 g/dL (ref 3.5–5.0)
Alkaline Phosphatase: 56 U/L (ref 38–126)
Anion gap: 6 (ref 5–15)
BUN: 17 mg/dL (ref 8–23)
CO2: 28 mmol/L (ref 22–32)
Calcium: 9.3 mg/dL (ref 8.9–10.3)
Chloride: 106 mmol/L (ref 98–111)
Creatinine: 0.64 mg/dL (ref 0.61–1.24)
GFR, Estimated: >60 mL/min (ref >60)
Glucose, Bld: 140 mg/dL — ABNORMAL HIGH (ref 70–99)
Potassium: 4.9 mmol/L (ref 3.5–5.1)
Sodium: 140 mmol/L (ref 135–145)
Total Bilirubin: 0.4 mg/dL (ref 0.0–1.2)
Total Protein: 7.4 g/dL (ref 6.5–8.1)

## 2023-07-27 NOTE — Assessment & Plan Note (Addendum)
On losartan increased to 100 mg daily last month. Goal is <140/90

## 2023-07-27 NOTE — Telephone Encounter (Signed)
[facility] CSW Progress Note  Clinical Child psychotherapist  spoke with patient and confirmed appointment with pest treatment for Thursday  1/23    Marguerita Merles, [place] Clinical Social Worker [facility]

## 2023-07-27 NOTE — Progress Notes (Signed)
[facility] CSW Progress Note  Clinical Child psychotherapist  met  with patient during provider appointment. Patient confirmed treatment is estimated to start next week. Patient denied any other needs at this time.  Marguerita Merles, LCSW Clinical Social Worker [facility]

## 2023-07-28 LAB — PROSTATE-SPECIFIC AG, SERUM (LABCORP): Prostate Specific Ag, Serum: 1 ng/mL (ref 0.0–4.0)

## 2023-07-28 LAB — TESTOSTERONE: Testosterone: 25 ng/dL — ABNORMAL LOW (ref 264–916)

## 2023-07-30 ENCOUNTER — Ambulatory Visit: Payer: Medicare PPO

## 2023-08-10 ENCOUNTER — Ambulatory Visit: Payer: Medicare PPO

## 2023-08-10 ENCOUNTER — Other Ambulatory Visit: Payer: Medicare PPO

## 2023-08-13 ENCOUNTER — Ambulatory Visit: Payer: Medicare PPO

## 2023-08-16 ENCOUNTER — Other Ambulatory Visit: Payer: Self-pay

## 2023-09-11 NOTE — Assessment & Plan Note (Signed)
 baseline bone mineral density: 04/06/2023 Right Femur Neck  T= -1.8 osteopenia  calcium (1000-1200 mg daily from food and supplements) and vitamin D3 (1000 IU daily) Control and prevent diabetes Did not have time to address dental evaluation today exercises (30 minutes per day) Limit alcohol consumption and avoid smoking Monitoring control blood pressure

## 2023-09-11 NOTE — Assessment & Plan Note (Signed)
 Continue ADT  Continue darolutamide.  He is on low-dose simvastatin, will monitor for any toxicity. Had one dose of docetaxel. Given the AAA, discuss potential rupture which we cannot predict. After discussion will use lower dose also with his age.  Return to see me with PSA, testosterone in about 6 weeks Radiation completed on L hip/pelvis 30 Gy/10 fx

## 2023-09-11 NOTE — Assessment & Plan Note (Addendum)
 On losartan 100 mg daily

## 2023-09-11 NOTE — Assessment & Plan Note (Signed)
 On simvastatin. No toxicities with daro.

## 2023-09-11 NOTE — Progress Notes (Signed)
 Patient Care Team: Octavia Heir, NP as PCP - General (Adult Health Nurse Practitioner) Antony Contras, MD as Consulting Physician (Ophthalmology) Octavia Heir, NP as Nurse Practitioner (Adult Health Nurse Practitioner) Cherlyn Cushing, RN as Oncology Nurse Navigator  Clinic Day:  09/14/2023  Referring physician: Octavia Heir, NP  ASSESSMENT & PLAN:   Assessment & Plan: [name] is a 78 y.o. man with history of hypertension, hyperlipidemia here for follow-up prostate cancer   Current Diagnosis: mHSPC Initial diagnosis: 08/2022. mHSPC with lymph node, bone, lung metastases. Current Treatment: ADT, darolutamide. Genetic testing for metastatic prostate cancer: neg. 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   Somatic testing, no actionable mutation.  Variant of SF3B1, TP53, CTNNB1. pMMR. TMB 3.7. No HRR mutation.  Getting ADT through [facility].   Report he can see me any day but Tue.   Tolerating treatment well. Will continue ADT/daro.  Prostate cancer metastatic to bone [facility]) Continue ADT  Continue darolutamide.  He is on low-dose simvastatin, will monitor for any toxicity. Had one dose of docetaxel. Given the AAA, discuss potential rupture which we cannot predict. After discussion will use lower dose also with his age.  Return to see me with PSA, testosterone in about 6 weeks Radiation completed on L hip/pelvis 30 Gy/10 fx  Essential hypertension On losartan 100 mg daily   Mixed hyperlipidemia On simvastatin. No toxicities with daro.  At risk for side effect of medication baseline bone mineral density: 04/06/2023 Right Femur Neck  T= -1.8 osteopenia  calcium (1000-1200 mg daily from food and supplements) and vitamin D3 (1000 IU daily) Control and prevent diabetes Did not have time to address dental evaluation today exercises (30 minutes per day) Limit alcohol consumption and avoid smoking Monitoring control blood pressure    The patient  understands the plans discussed today and is in agreement with them.  He knows to contact our office if he develops concerns prior to his next appointment.  Melven Sartorius, MD  [facility]C - A DEPT OF [facility] [address] [place] [address] Dept: [phone] Dept Fax: [phone]   Orders Placed This Encounter  Procedures   CBC with Differential (Cancer Center Only)    Standing Status:   Future    Expiration Date:   09/13/2024   CMP (Cancer Center only)    Standing Status:   Future    Expiration Date:   09/13/2024   Testosterone    Standing Status:   Future    Expiration Date:   09/13/2024   Prostate-Specific AG, Serum    Standing Status:   Future    Expiration Date:   09/13/2024      CHIEF COMPLAINT:  CC: mHSPC  Treatment: ADT with darolutamide INTERVAL HISTORY:  [name] is here today for repeat clinical assessment.  He is taking daro and only missed 1 or 2 doses. No hot flashes. Some fatigue. He drinks about 30-40 oz a day. He goes back to Urology every 25-30 days and catheter still in place. No chest pain, short of breath or stomach pain.  Sometimes has to strain for bowel movement but not always. No bloody stool. No rash, dizziness or falling. No coughing. No muscle pain.  I have reviewed the past medical history, past surgical history, social history and family history with the patient and they are unchanged from previous note.  ALLERGIES:  is allergic to  oxycontin [oxycodone hcl], penicillins, celebrex [celecoxib], erythromycin, and tetracyclines & related.  MEDICATIONS:  Current Outpatient Medications  Medication Sig Dispense Refill   acetaminophen (TYLENOL) 325 MG tablet Take 650 mg by mouth every 6 (six) hours as needed (pain and sleep aid).     cetirizine (ZYRTEC) 10 MG tablet Take 1 tablet (10 mg total) by mouth daily. 30 tablet 11   darolutamide (NUBEQA) 300 MG tablet Take 2 tablets (600  mg total) by mouth 2 (two) times daily with a meal. 120 tablet 11   finasteride (PROSCAR) 5 MG tablet TAKE 1 TABLET(5 MG) BY MOUTH DAILY 90 tablet 3   losartan (COZAAR) 100 MG tablet TAKE 1 TABLET BY MOUTH DAILY 90 tablet 3   silodosin (RAPAFLO) 8 MG CAPS capsule Take 8 mg by mouth daily with breakfast.     simvastatin (ZOCOR) 20 MG tablet TAKE 1 TABLET(20 MG) BY MOUTH DAILY AT 6 PM 90 tablet 1   traZODone (DESYREL) 100 MG tablet TAKE 1 TABLET(100 MG) BY MOUTH AT BEDTIME 90 tablet 1   triamcinolone cream (KENALOG) 0.1 % Apply 1 Application topically 2 (two) times daily. 30 g 4   No current facility-administered medications for this visit.    HISTORY OF PRESENT ILLNESS:   Oncology History  Prostate cancer metastatic to bone [facility])  08/2022 Surgery   TURP for BPH: Prostatic adenocarcinoma, Gleason score 4+5=9 (grade group5) involves 80% of the resected tissue.  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    08/2022 Initial Diagnosis   Prostate cancer metastatic to bone (HCC)   09/28/2022 Tumor Marker   PSA 33.5   10/30/2022 Imaging   PSMA PET IMPRESSION: 1. There is diffuse radiotracer uptake throughout the prostate gland compatible with residual/recurrent prostate cancer. 2. Bilateral tracer avid pelvic lymph nodes compatible with nodal metastasis. 3. Multifocal tracer avid bone metastases. 4. Solitary tracer avid pulmonary nodule within the right upper lobe is identified. 5. 5.2 cm infrarenal abdominal aortic aneurysm. Recommend follow-up every 6 months and vascular consultation. This recommendation follows ACR consensus guidelines: White Paper of the ACR Incidental Findings Committee II on Vascular Findings. J Am Coll Radiol 2013; 10:789-794. Aortic aneurysm NOS (ICD10-I71.9). 6.  Aortic Atherosclerosis (ICD10-I70.0).   11/2022 -   Chemotherapy   started Orgovynx and converted to Eligard.   03/02/2023 Tumor Marker   PSA 14.8 testosterone 19.7. alk phos 77   04/09/2023 Genetic Testing   Negative CustomNext-Cancer +RNAinsight Panel.  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  Report date is 04/09/2023.   The CustomNext-Cancer+RNAinsight panel offered by [facility] includes sequencing, rearrangement, and RNA analysis for the following 47 genes:  APC, ATM, AXIN2, BAP1, BARD1, BMPR1A, BRCA1, BRCA2, BRIP1, CDH1, CDK4, CDKN2A, CHEK2, CTNNA1, DICER1, EPCAM, FH, GREM1, HOXB13, KIT, MEN1, MLH1, MSH2, MSH3, MSH6, MUTYH, NF1, NTHL1, PALB2, PDGFRA, PMS2, POLD1, POLE, PTEN, RAD51C, RAD51D, SDHA, SDHB, SDHC, SDHD, SMAD4, SMARCA4, STK11, TP53, TSC1, TSC2, VHL.     04/2023 -  Chemotherapy   Started Xtandi 160 mg.   06/29/2023 - 06/29/2023 Chemotherapy   Patient is on Treatment Plan : PROSTATE Docetaxel (50) q14d         REVIEW OF SYSTEMS:   All relevant systems were reviewed with the patient and are negative.   VITALS:  Blood pressure (!) 157/65, pulse 66, temperature (!) 97.5 F (36.4 C), temperature source Temporal, resp. rate 16, height 5'  8" (1.727 m), weight 212 lb 4.8 oz (96.3 kg), SpO2 97%.  Wt Readings from Last 3 Encounters:  09/14/23 212 lb 4.8 oz (96.3 kg)  07/27/23 204 lb 1.6 oz (92.6 kg)  05/28/23 206 lb 12.8 oz (93.8 kg)    Body mass index is 32.28 kg/m.  Performance status (ECOG): 2 - Symptomatic, <50% confined to bed  PHYSICAL EXAM:   GENERAL:alert, no distress and comfortable SKIN: skin color normal, no rashes and jaundice EYES: normal, sclera clear LUNGS: normal breathing effort.   ABDOMEN: abdomen soft, non-tender and nondistended Musculoskeletal: no edema  LABORATORY DATA:  I have reviewed the data as listed    Component Value Date/Time   NA 139 09/14/2023 0727   NA 138 11/03/2015 0835   K 3.6 09/14/2023 0727   CL 106 09/14/2023 0727   CO2 26 09/14/2023 0727   GLUCOSE 122 (H) 09/14/2023  0727   BUN 22 09/14/2023 0727   BUN 16 11/03/2015 0835   CREATININE 0.59 (L) 09/14/2023 0727   CREATININE 0.71 12/25/2022 0825   CALCIUM 8.7 (L) 09/14/2023 0727   PROT 6.8 09/14/2023 0727   PROT 7.1 11/03/2015 0835   ALBUMIN 4.1 09/14/2023 0727   ALBUMIN 4.4 11/03/2015 0835   AST 17 09/14/2023 0727   ALT 19 09/14/2023 0727   ALKPHOS 58 09/14/2023 0727   BILITOT 0.4 09/14/2023 0727   GFRNONAA >60 09/14/2023 0727   GFRNONAA 87 11/12/2020 1158   GFRAA 101 11/12/2020 1158    No results found for: "SPEP", "UPEP"  Lab Results  Component Value Date   WBC 4.0 09/14/2023   NEUTROABS 2.1 09/14/2023   HGB 13.5 09/14/2023   HCT 40.5 09/14/2023   MCV 96.0 09/14/2023   PLT 208 09/14/2023      Chemistry      Component Value Date/Time   NA 139 09/14/2023 0727   NA 138 11/03/2015 0835   K 3.6 09/14/2023 0727   CL 106 09/14/2023 0727   CO2 26 09/14/2023 0727   BUN 22 09/14/2023 0727   BUN 16 11/03/2015 0835   CREATININE 0.59 (L) 09/14/2023 0727   CREATININE 0.71 12/25/2022 0825      Component Value Date/Time   CALCIUM 8.7 (L) 09/14/2023 0727   ALKPHOS 58 09/14/2023 0727   AST 17 09/14/2023 0727   ALT 19 09/14/2023 0727   BILITOT 0.4 09/14/2023 0727       RADIOGRAPHIC STUDIES: I have personally reviewed the radiological images as listed and agreed with the findings in the report. No results found.

## 2023-09-14 ENCOUNTER — Other Ambulatory Visit: Payer: Medicare PPO

## 2023-09-14 ENCOUNTER — Inpatient Hospital Stay: Payer: Medicare PPO

## 2023-09-14 ENCOUNTER — Ambulatory Visit: Payer: Medicare PPO

## 2023-09-14 VITALS — BP 157/65 | HR 66 | Temp 97.5°F | Resp 16 | Ht 68.0 in | Wt 212.3 lb

## 2023-09-14 DIAGNOSIS — C61 Malignant neoplasm of prostate: Secondary | ICD-10-CM | POA: Insufficient documentation

## 2023-09-14 DIAGNOSIS — Z923 Personal history of irradiation: Secondary | ICD-10-CM | POA: Insufficient documentation

## 2023-09-14 DIAGNOSIS — C7951 Secondary malignant neoplasm of bone: Secondary | ICD-10-CM | POA: Diagnosis not present

## 2023-09-14 DIAGNOSIS — I10 Essential (primary) hypertension: Secondary | ICD-10-CM | POA: Diagnosis not present

## 2023-09-14 DIAGNOSIS — E782 Mixed hyperlipidemia: Secondary | ICD-10-CM | POA: Diagnosis not present

## 2023-09-14 DIAGNOSIS — Z9189 Other specified personal risk factors, not elsewhere classified: Secondary | ICD-10-CM

## 2023-09-14 DIAGNOSIS — Z79899 Other long term (current) drug therapy: Secondary | ICD-10-CM | POA: Diagnosis not present

## 2023-09-14 LAB — CBC WITH DIFFERENTIAL (CANCER CENTER ONLY)
Abs Immature Granulocytes: 0.02 10*3/uL (ref 0.00–0.07)
Basophils Absolute: 0.1 10*3/uL (ref 0.0–0.1)
Basophils Relative: 2 %
Eosinophils Absolute: 0.1 10*3/uL (ref 0.0–0.5)
Eosinophils Relative: 3 %
HCT: 40.5 % (ref 39.0–52.0)
Hemoglobin: 13.5 g/dL (ref 13.0–17.0)
Immature Granulocytes: 1 %
Lymphocytes Relative: 33 %
Lymphs Abs: 1.3 10*3/uL (ref 0.7–4.0)
MCH: 32 pg (ref 26.0–34.0)
MCHC: 33.3 g/dL (ref 30.0–36.0)
MCV: 96 fL (ref 80.0–100.0)
Monocytes Absolute: 0.4 10*3/uL (ref 0.1–1.0)
Monocytes Relative: 10 %
Neutro Abs: 2.1 10*3/uL (ref 1.7–7.7)
Neutrophils Relative %: 51 %
Platelet Count: 208 10*3/uL (ref 150–400)
RBC: 4.22 MIL/uL (ref 4.22–5.81)
RDW: 13.9 % (ref 11.5–15.5)
WBC Count: 4 10*3/uL (ref 4.0–10.5)
nRBC: 0 % (ref 0.0–0.2)

## 2023-09-14 LAB — CMP (CANCER CENTER ONLY)
ALT: 19 U/L (ref 0–44)
AST: 17 U/L (ref 15–41)
Albumin: 4.1 g/dL (ref 3.5–5.0)
Alkaline Phosphatase: 58 U/L (ref 38–126)
Anion gap: 7 (ref 5–15)
BUN: 22 mg/dL (ref 8–23)
CO2: 26 mmol/L (ref 22–32)
Calcium: 8.7 mg/dL — ABNORMAL LOW (ref 8.9–10.3)
Chloride: 106 mmol/L (ref 98–111)
Creatinine: 0.59 mg/dL — ABNORMAL LOW (ref 0.61–1.24)
GFR, Estimated: >60 mL/min (ref >60)
Glucose, Bld: 122 mg/dL — ABNORMAL HIGH (ref 70–99)
Potassium: 3.6 mmol/L (ref 3.5–5.1)
Sodium: 139 mmol/L (ref 135–145)
Total Bilirubin: 0.4 mg/dL (ref 0.0–1.2)
Total Protein: 6.8 g/dL (ref 6.5–8.1)

## 2023-09-15 LAB — PROSTATE-SPECIFIC AG, SERUM (LABCORP): Prostate Specific Ag, Serum: 0.8 ng/mL (ref 0.0–4.0)

## 2023-09-15 LAB — TESTOSTERONE: Testosterone: 15 ng/dL — ABNORMAL LOW (ref 264–916)

## 2023-10-03 ENCOUNTER — Other Ambulatory Visit: Payer: Self-pay | Admitting: Orthopedic Surgery

## 2023-10-03 DIAGNOSIS — G4709 Other insomnia: Secondary | ICD-10-CM

## 2023-10-25 ENCOUNTER — Encounter: Payer: Medicare PPO | Admitting: Adult Health

## 2023-10-26 ENCOUNTER — Encounter (HOSPITAL_COMMUNITY): Payer: Self-pay | Admitting: Emergency Medicine

## 2023-10-26 ENCOUNTER — Ambulatory Visit (HOSPITAL_COMMUNITY)
Admission: EM | Admit: 2023-10-26 | Discharge: 2023-10-26 | Disposition: A | Discharge status: Home / Self Care | Attending: Physician Assistant | Admitting: Physician Assistant

## 2023-10-26 DIAGNOSIS — N390 Urinary tract infection, site not specified: Secondary | ICD-10-CM | POA: Diagnosis not present

## 2023-10-26 DIAGNOSIS — I10 Essential (primary) hypertension: Secondary | ICD-10-CM

## 2023-10-26 DIAGNOSIS — T83511A Infection and inflammatory reaction due to indwelling urethral catheter, initial encounter: Secondary | ICD-10-CM | POA: Diagnosis not present

## 2023-10-26 MED ORDER — CIPROFLOXACIN HCL 500 MG PO TABS: 500.0000 mg | ORAL_TABLET | Freq: Two times a day (BID) | ORAL | 0 refills | Status: DC

## 2023-10-26 NOTE — ED Triage Notes (Signed)
 Patient states that he had his foley catheter replaced yesterday, given 2 injections for an infection.  Patient's urologists was suppose to send in an antibiotic yesterday and never did.

## 2023-10-26 NOTE — Discharge Instructions (Signed)
 We are treating you for urinary tract infection.  Please start ciprofloxacin  twice daily for a week.  Follow-up with your urologist as soon as possible.  If you have any worsening symptoms including fever, abdominal pain, nausea, vomiting, blood in your urine please go to the emergency room immediately.  Your blood pressure was elevated.  Please take your blood pressure medicine when you get home.  If you develop any chest pain, shortness of breath, headache, vision change, dizziness in the setting of hyper pressure you need to go to the emergency room.

## 2023-10-26 NOTE — ED Provider Notes (Signed)
 [facility]    CSN: [id number] Arrival date & time: 10/26/23  1108      History   Chief Complaint Chief Complaint  Patient presents with   Possible Urinary Infection    HPI David Lakeman. is a 78 y.o. male.   Patient presents today with a weeklong history of dysuria and penile burning sensation.  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  He is followed by urology and has a chronic indwelling Foley catheter.  He was seen by urology a few days ago where they replaced his catheter and gave him 2 injections to help with infection.  He is not remember the name of these medicines.  He was supposed to be prescribed a medicine to take by mouth but when he went to the pharmacy today there was nothing available.  He was requesting that we start a medication as his symptoms are consistent with previous episodes of UTI.  He denies any fever, nausea, vomiting.  He believes that his urologist obtained a culture.  He is scheduled to see oncology 10/29/2023 and they will do blood work so he declined blood work today.  Patient's blood pressure is elevated.  He has not taken his medication.  He denies any chest pain, shortness of breath, headache, vision change, dizziness.    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    Elevated PSA    Flatulence, eructation, and gas pain    Headache(784.0)    Hemorrhoids, external    Herpes zoster with unspecified complication    Hyperpotassemia    Insomnia, unspecified    Lumbago    Lumbago    Myalgia and myositis, unspecified    Nocturia    Obesity, unspecified     Osteoarthrosis, unspecified whether generalized or localized, unspecified site    Other abnormal blood chemistry    Other headache syndromes(339.89)    Pre-diabetes    Pure hyperglyceridemia    Rash and other nonspecific skin eruption    Rotator cuff (capsule) sprain    Routine general medical examination at a health care facility    Tobacco use disorder    Unspecified essential hypertension    Unspecified hypertrophic and atrophic condition of skin    Unspecified sleep apnea     Patient Active Problem List   Diagnosis Date Noted   Infestation by bed bug 05/28/2023   Genetic testing 04/12/2023   Abdominal aortic aneurysm (AAA) (HCC) 03/29/2023   Prostate cancer metastatic to bone (HCC) 03/27/2023   At risk for side effect of medication 03/27/2023   BPH (benign prostatic hyperplasia) 08/22/2022   Pain in left shoulder 11/03/2020   History of melanoma 04/05/2020   Left sided sciatica 12/01/2019   Postherpetic neuralgia 11/28/2018   Primary osteoarthritis of left knee 11/28/2018   Need for influenza vaccination 03/28/2018   S/P cataract extraction and insertion of intraocular lens, right 01/08/2017   Environmental allergies 11/08/2015   Seasonal allergies 11/08/2015   Encounter for smoking cessation counseling 11/08/2015   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   Hypertriglyceridemia 09/11/2013   Depression 09/11/2013  Insomnia 09/11/2013   Generalized anxiety disorder 09/11/2013   Dry mouth 09/11/2013   Hyperglycemia 09/11/2013   Mixed hyperlipidemia 10/28/2012   Essential hypertension 10/28/2012   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   Obesity, unspecified 10/28/2012    Past Surgical History:  Procedure Laterality Date   CYSTOSCOPY N/A 08/22/2022   Procedure: CYSTOSCOPY;  Surgeon: Rosalind Zachary NOVAK, MD;  Location: [facility];  Service: Urology;  Laterality: N/A;   DENTAL SURGERY  05/10/2021   Per patient   HEMORRHOID SURGERY      PROSTATE BIOPSY     TRANSURETHRAL RESECTION OF PROSTATE N/A 08/22/2022   Procedure: TRANSURETHRAL RESECTION OF THE PROSTATE (TURP);  Surgeon: Rosalind Zachary NOVAK, MD;  Location: [facility];  Service: Urology;  Laterality: N/A;   tumorectomy Left    Removed tumor from his chest.   VARICOSE VEIN SURGERY         Home Medications    Prior to Admission medications   Medication Sig Start Date End Date Taking? Authorizing Provider  acetaminophen  (TYLENOL ) 325 MG tablet Take 650 mg by mouth every 6 (six) hours as needed (pain and sleep aid).   Yes [provider]  cetirizine  (ZYRTEC ) 10 MG tablet Take 1 tablet (10 mg total) by mouth daily. 12/28/22  Yes Fargo, Amy E, NP  ciprofloxacin  (CIPRO ) 500 MG tablet Take 1 tablet (500 mg total) by mouth every 12 (twelve) hours. 10/26/23  Yes Eufemio Strahm K, PA-C  darolutamide  (NUBEQA ) 300 MG tablet Take 2 tablets (600 mg total) by mouth 2 (two) times daily with a meal. 05/28/23  Yes Tina Pauletta BROCKS, MD  finasteride  (PROSCAR ) 5 MG tablet TAKE 1 TABLET(5 MG) BY MOUTH DAILY 08/21/22  Yes Fargo, Amy E, NP  losartan  (COZAAR ) 100 MG tablet TAKE 1 TABLET BY MOUTH DAILY 05/28/23  Yes Tina Pauletta BROCKS, MD  silodosin  (RAPAFLO ) 8 MG CAPS capsule Take 8 mg by mouth daily with breakfast.   Yes [provider]  simvastatin  (ZOCOR ) 20 MG tablet TAKE 1 TABLET(20 MG) BY MOUTH DAILY AT 6 PM 07/02/23  Yes Fargo, Amy E, NP  traZODone  (DESYREL ) 100 MG tablet TAKE 1 TABLET(100 MG) BY MOUTH AT BEDTIME 10/03/23  Yes Fargo, Amy E, NP  triamcinolone  cream (KENALOG ) 0.1 % Apply 1 Application topically 2 (two) times daily. 05/03/23  Yes Fargo, Amy E, NP    Family History Family History  Problem Relation Age of Onset   Diabetes Mother    Heart disease Mother    Stroke Mother    Stroke Father    Leukemia Maternal Aunt        dx >50   Breast cancer Paternal Aunt        dx >50   Stomach cancer Cousin        d. 66; pat male cousin    Social  History Social History   Tobacco Use   Smoking status: Former    Current packs/day: 0.00    Average packs/day: 1.5 packs/day for 55.0 years (82.5 ttl pk-yrs)    Types: Cigarettes    Start date: 07/29/1960    Quit date: 07/30/2015    Years since quitting: 8.2   Smokeless tobacco: Never   Tobacco comments:    pt thinks increased effexor  dose helped him to quit  Vaping Use   Vaping status: Never Used  Substance Use Topics   Alcohol use: No    Alcohol/week: 0.0 standard drinks of alcohol   Drug use:  No     Allergies   Oxycontin  [oxycodone  hcl], Penicillins, Celebrex [celecoxib], Erythromycin, and Tetracyclines & related   Review of Systems Review of Systems  Constitutional:  Negative for activity change, appetite change, fatigue and fever.  Eyes:  Negative for visual disturbance.  Respiratory:  Negative for shortness of breath.   Cardiovascular:  Negative for chest pain.  Gastrointestinal:  Negative for abdominal pain, diarrhea, nausea and vomiting.  Genitourinary:  Positive for dysuria. Negative for frequency and urgency.  Neurological:  Negative for dizziness, light-headedness and headaches.     Physical Exam Triage Vital Signs ED Triage Vitals  Encounter Vitals Group     BP 10/26/23 1235 (!) 171/71     Systolic BP Percentile --      Diastolic BP Percentile --      Pulse Rate 10/26/23 1235 69     Resp 10/26/23 1235 18     Temp 10/26/23 1235 97.7 F (36.5 C)     Temp Source 10/26/23 1235 Oral     SpO2 10/26/23 1235 93 %     Weight --      Height --      Head Circumference --      Peak Flow --      Pain Score 10/26/23 1236 3     Pain Loc --      Pain Education --      Exclude from Growth Chart --    No data found.  Updated Vital Signs BP (!) 171/71 (BP Location: Left Arm)   Pulse 69   Temp 97.7 F (36.5 C) (Oral)   Resp 18   SpO2 93%   Visual Acuity Right Eye Distance:   Left Eye Distance:   Bilateral Distance:    Right Eye Near:   Left Eye Near:     Bilateral Near:     Physical Exam Vitals reviewed.  Constitutional:      General: He is awake.     Appearance: Normal appearance. He is well-developed. He is not ill-appearing.     Comments: Very pleasant male appear stated age in no acute distress sitting comfortably in exam room  HENT:     Head: Normocephalic and atraumatic.  Cardiovascular:     Rate and Rhythm: Normal rate and regular rhythm.     Heart sounds: Normal heart sounds, S1 normal and S2 normal. No murmur heard. Pulmonary:     Effort: Pulmonary effort is normal.     Breath sounds: Normal breath sounds. No stridor. No wheezing, rhonchi or rales.     Comments: Clear to auscultation bilaterally Abdominal:     Palpations: Abdomen is soft.     Tenderness: There is no abdominal tenderness.  Genitourinary:    Penis: Normal and uncircumcised.      Comments: Indwelling Foley catheter Neurological:     Mental Status: He is alert.  Psychiatric:        Behavior: Behavior is cooperative.      UC Treatments / Results  Labs (all labs ordered are listed, but only abnormal results are displayed) Labs Reviewed - No data to display  EKG   Radiology No results found.  Procedures Procedures (including critical care time)  Medications Ordered in UC Medications - No data to display  Initial Impression / Assessment and Plan / UC Course  I have reviewed the triage vital signs and the nursing notes.  Pertinent labs & imaging results that were available during my care of the patient were reviewed by  me and considered in my medical decision making (see chart for details).     Patient is well-appearing, afebrile, nontoxic, nontachycardic.  Concern for UTI given his clinical presentation with indwelling catheter.  I did offer him a gram of Rocephin in clinic but he declined this as he was given injection at his urologist in does not want another injection.  We will start ciprofloxacin  twice daily for 7 days.  No indication  for dose adjustment based on metabolic panel from 09/14/2023 with creatinine of 0.59 and calculated creatinine clearance of 140.35 mL/min.  I did offer basic blood work including CBC and BMP to make sure that he does not have significant leukocytosis or AKI, however, patient reports that he is going to see his oncologist first thing next week and will have blood work with them.  He was encouraged to contact his urologist first thing next week to ensure that culture was obtained during Foley change and determine if additional antibiotic treatment is required.  If he has any worsening or changing symptoms including blood in his urine, fever, nausea, vomiting, weakness he needs to be seen emergently.  Strict return precautions given.  All questions were answered to patient satisfaction.  His blood pressure was elevated.  He denies any signs or symptoms of endorgan damage.  He was encouraged to take his medicine when he gets home.  He has an appointment with medical provider on Monday and will have his blood pressure rechecked.  We discussed that if he develops any chest pain, shortness of breath, headache, vision change, dizziness in setting of high blood pressure he needs to go to the emergency room.  All questions were answered to patient satisfaction.  Final Clinical Impressions(s) / UC Diagnoses   Final diagnoses:  Urinary tract infection associated with indwelling urethral catheter, initial encounter (HCC)  Elevated blood pressure reading in office with diagnosis of hypertension     Discharge Instructions      We are treating you for urinary tract infection.  Please start ciprofloxacin  twice daily for a week.  Follow-up with your urologist as soon as possible.  If you have any worsening symptoms including fever, abdominal pain, nausea, vomiting, blood in your urine please go to the emergency room immediately.  Your blood pressure was elevated.  Please take your blood pressure medicine when you get  home.  If you develop any chest pain, shortness of breath, headache, vision change, dizziness in the setting of hyper pressure you need to go to the emergency room.     ED Prescriptions     Medication Sig Dispense Auth. Provider   ciprofloxacin  (CIPRO ) 500 MG tablet Take 1 tablet (500 mg total) by mouth every 12 (twelve) hours. 14 tablet Shemiah Rosch K, PA-C      PDMP not reviewed this encounter.   Sherrell Rocky POUR, PA-C 10/26/23 1303

## 2023-10-29 ENCOUNTER — Inpatient Hospital Stay

## 2023-10-29 ENCOUNTER — Inpatient Hospital Stay (HOSPITAL_BASED_OUTPATIENT_CLINIC_OR_DEPARTMENT_OTHER)

## 2023-10-29 VITALS — BP 140/84 | HR 79 | Temp 97.7°F | Resp 15 | Wt 211.5 lb

## 2023-10-29 DIAGNOSIS — C7951 Secondary malignant neoplasm of bone: Secondary | ICD-10-CM | POA: Insufficient documentation

## 2023-10-29 DIAGNOSIS — R5383 Other fatigue: Secondary | ICD-10-CM | POA: Insufficient documentation

## 2023-10-29 DIAGNOSIS — C61 Malignant neoplasm of prostate: Secondary | ICD-10-CM | POA: Diagnosis present

## 2023-10-29 DIAGNOSIS — Z79899 Other long term (current) drug therapy: Secondary | ICD-10-CM | POA: Insufficient documentation

## 2023-10-29 DIAGNOSIS — E782 Mixed hyperlipidemia: Secondary | ICD-10-CM | POA: Diagnosis not present

## 2023-10-29 DIAGNOSIS — I10 Essential (primary) hypertension: Secondary | ICD-10-CM | POA: Diagnosis not present

## 2023-10-29 DIAGNOSIS — Z9189 Other specified personal risk factors, not elsewhere classified: Secondary | ICD-10-CM | POA: Diagnosis not present

## 2023-10-29 DIAGNOSIS — Z923 Personal history of irradiation: Secondary | ICD-10-CM | POA: Diagnosis not present

## 2023-10-29 DIAGNOSIS — R232 Flushing: Secondary | ICD-10-CM | POA: Insufficient documentation

## 2023-10-29 LAB — CMP (CANCER CENTER ONLY)
ALT: 16 U/L (ref 0–44)
AST: 17 U/L (ref 15–41)
Albumin: 4.3 g/dL (ref 3.5–5.0)
Alkaline Phosphatase: 50 U/L (ref 38–126)
Anion gap: 6 (ref 5–15)
BUN: 24 mg/dL — ABNORMAL HIGH (ref 8–23)
CO2: 27 mmol/L (ref 22–32)
Calcium: 9.5 mg/dL (ref 8.9–10.3)
Chloride: 106 mmol/L (ref 98–111)
Creatinine: 0.74 mg/dL (ref 0.61–1.24)
GFR, Estimated: >60 mL/min (ref >60)
Glucose, Bld: 158 mg/dL — ABNORMAL HIGH (ref 70–99)
Potassium: 3.8 mmol/L (ref 3.5–5.1)
Sodium: 139 mmol/L (ref 135–145)
Total Bilirubin: 0.5 mg/dL (ref 0.0–1.2)
Total Protein: 7.2 g/dL (ref 6.5–8.1)

## 2023-10-29 LAB — CBC WITH DIFFERENTIAL (CANCER CENTER ONLY)
Abs Immature Granulocytes: 0.02 10*3/uL (ref 0.00–0.07)
Basophils Absolute: 0.1 10*3/uL (ref 0.0–0.1)
Basophils Relative: 1 %
Eosinophils Absolute: 0.1 10*3/uL (ref 0.0–0.5)
Eosinophils Relative: 3 %
HCT: 40.7 % (ref 39.0–52.0)
Hemoglobin: 13.7 g/dL (ref 13.0–17.0)
Immature Granulocytes: 1 %
Lymphocytes Relative: 29 %
Lymphs Abs: 1.3 10*3/uL (ref 0.7–4.0)
MCH: 33 pg (ref 26.0–34.0)
MCHC: 33.7 g/dL (ref 30.0–36.0)
MCV: 98.1 fL (ref 80.0–100.0)
Monocytes Absolute: 0.5 10*3/uL (ref 0.1–1.0)
Monocytes Relative: 12 %
Neutro Abs: 2.4 10*3/uL (ref 1.7–7.7)
Neutrophils Relative %: 54 %
Platelet Count: 183 10*3/uL (ref 150–400)
RBC: 4.15 MIL/uL — ABNORMAL LOW (ref 4.22–5.81)
RDW: 13.1 % (ref 11.5–15.5)
WBC Count: 4.4 10*3/uL (ref 4.0–10.5)
nRBC: 0 % (ref 0.0–0.2)

## 2023-10-29 NOTE — Assessment & Plan Note (Signed)
 Continue ADT  Continue darolutamide .  He is on low-dose simvastatin , will monitor for any toxicity. Return to see me with PSA, testosterone  in about 6 weeks Radiation completed on L hip/pelvis 30 Gy/10 fx

## 2023-10-29 NOTE — Assessment & Plan Note (Signed)
 baseline bone mineral density: 04/06/2023 Right Femur Neck  T= -1.8 osteopenia  calcium (1000-1200 mg daily from food and supplements) and vitamin D3 (1000 IU daily) Control and prevent diabetes Did not have time to address dental evaluation today exercises (30 minutes per day) Limit alcohol consumption and avoid smoking Monitoring control blood pressure

## 2023-10-29 NOTE — Assessment & Plan Note (Signed)
 On simvastatin. No toxicities with daro.

## 2023-10-29 NOTE — Progress Notes (Signed)
 Patient Care Team: Arnetha Bhat, NP as PCP - General (Adult Health Nurse Practitioner) Alvina Axon, MD as Consulting Physician (Ophthalmology) Arnetha Bhat, NP as Nurse Practitioner (Adult Health Nurse Practitioner) Katheleen Palmer, RN as Oncology Nurse Navigator  Clinic Day:  10/29/2023  Referring physician: Arnetha Bhat, NP  ASSESSMENT & PLAN:   Assessment & Plan: David [name] is a 78 y.o. man with history of hypertension, hyperlipidemia here for follow-up prostate cancer   Current Diagnosis: mHSPC Initial diagnosis: 08/2022. mHSPC with lymph node, bone, lung metastases. Previous treatment: Had one dose of docetaxel . Given the AAA, discuss potential rupture which we cannot predict. After discussion will use lower dose also with his age.  Current Treatment: ADT, darolutamide . Genetic testing for metastatic prostate cancer: neg. 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   Somatic testing, no actionable mutation.  Variant of SF3B1, TP53, CTNNB1. pMMR. TMB 3.7. No HRR mutation.  Getting ADT through [facility].   Report he can see me any day but Tue.   Tolerating treatment well. Will continue ADT/daro.  Prostate cancer metastatic to bone (HCC) Continue ADT  Continue darolutamide .  He is on low-dose simvastatin , will monitor for any toxicity. Return to see me with PSA, testosterone  in about 6 weeks Radiation completed on L hip/pelvis 30 Gy/10 fx  Mixed hyperlipidemia On simvastatin . No toxicities with daro.  At risk for side effect of medication baseline bone mineral density: 04/06/2023 Right Femur Neck  T= -1.8 osteopenia  calcium (1000-1200 mg daily from food and supplements) and vitamin D3 (1000 IU daily) Control and prevent diabetes Did not have time to address dental evaluation today exercises (30 minutes per day) Limit alcohol consumption and avoid smoking Monitoring control blood pressure  The patient understands the plans discussed  today and is in agreement with them.  He knows to contact our office if he develops concerns prior to his next appointment.  Lowanda Ruddy, MD  [facility] - A DEPT OF [facility] [address] [place] [address] Dept: [phone] Dept Fax: [phone]   Orders Placed This Encounter  Procedures   CBC with Differential (Cancer Center Only)    Standing Status:   Future    Expiration Date:   10/28/2024   CMP (Cancer Center only)    Standing Status:   Future    Expiration Date:   10/28/2024   Prostate-Specific AG, Serum    Standing Status:   Future    Expiration Date:   10/28/2024   Testosterone     Standing Status:   Future    Expiration Date:   10/28/2024      CHIEF COMPLAINT:  CC: mHSPC  Treatment: ADT with darolutamide  INTERVAL HISTORY:  David [name] is here today for repeat clinical assessment.  He is taking daro and no missed doses. Some hot flashes. Tolerable fatigue. He starts to drink more but not close to 60 oz a day. He goes back to Urology every month and catheter still in place. No chest pain, short of breath or stomach pain.  No new rash, dizziness or falling. No muscle pain.  I have reviewed the past medical history, past surgical history, social history and family history with the patient and they are unchanged from previous note.  ALLERGIES:  is allergic to oxycontin [oxycodone hcl], penicillins, celebrex [celecoxib], erythromycin, and tetracyclines & related.  MEDICATIONS:  Current Outpatient Medications  Medication Sig Dispense  Refill   acetaminophen  (TYLENOL ) 325 MG tablet Take 650 mg by mouth every 6 (six) hours as needed (pain and sleep aid).     cetirizine  (ZYRTEC ) 10 MG tablet Take 1 tablet (10 mg total) by mouth daily. 30 tablet 11   ciprofloxacin  (CIPRO ) 500 MG tablet Take 1 tablet (500 mg total) by mouth every 12 (twelve) hours. 14 tablet 0   darolutamide  (NUBEQA ) 300 MG tablet Take 2  tablets (600 mg total) by mouth 2 (two) times daily with a meal. 120 tablet 11   finasteride  (PROSCAR ) 5 MG tablet TAKE 1 TABLET(5 MG) BY MOUTH DAILY 90 tablet 3   losartan  (COZAAR ) 100 MG tablet TAKE 1 TABLET BY MOUTH DAILY 90 tablet 3   silodosin (RAPAFLO) 8 MG CAPS capsule Take 8 mg by mouth daily with breakfast.     simvastatin  (ZOCOR ) 20 MG tablet TAKE 1 TABLET(20 MG) BY MOUTH DAILY AT 6 PM 90 tablet 1   traZODone  (DESYREL ) 100 MG tablet TAKE 1 TABLET(100 MG) BY MOUTH AT BEDTIME 90 tablet 1   triamcinolone  cream (KENALOG ) 0.1 % Apply 1 Application topically 2 (two) times daily. 30 g 4   No current facility-administered medications for this visit.    HISTORY OF PRESENT ILLNESS:   Oncology History  Prostate cancer metastatic to bone [facility])  08/2022 Surgery   TURP for BPH: Prostatic adenocarcinoma, Gleason score 4+5=9 (grade group5) involves 80% of the resected tissue.  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    08/2022 Initial Diagnosis   Prostate cancer metastatic to bone (HCC)   09/28/2022 Tumor Marker   PSA 33.5   10/30/2022 Imaging   PSMA PET IMPRESSION: 1. There is diffuse radiotracer uptake throughout the prostate gland compatible with residual/recurrent prostate cancer. 2. Bilateral tracer avid pelvic lymph nodes compatible with nodal metastasis. 3. Multifocal tracer avid bone metastases. 4. Solitary tracer avid pulmonary nodule within the right upper lobe is identified. 5. 5.2 cm infrarenal abdominal aortic aneurysm. Recommend follow-up every 6 months and vascular consultation. This recommendation follows ACR consensus guidelines: White Paper of the ACR Incidental Findings Committee II on Vascular Findings. J Am Coll Radiol 2013; 10:789-794. Aortic aneurysm NOS (ICD10-I71.9). 6.  Aortic Atherosclerosis (ICD10-I70.0).    11/2022 -  Chemotherapy   started Orgovynx and converted to Eligard .   03/02/2023 Tumor Marker   PSA 14.8 testosterone  19.7. alk phos 77   04/09/2023 Genetic Testing   Negative CustomNext-Cancer +RNAinsight Panel.  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  Report date is 04/09/2023.   The CustomNext-Cancer+RNAinsight panel offered by [facility] includes sequencing, rearrangement, and RNA analysis for the following 47 genes:  APC, ATM, AXIN2, BAP1, BARD1, BMPR1A, BRCA1, BRCA2, BRIP1, CDH1, CDK4, CDKN2A, CHEK2, CTNNA1, DICER1, EPCAM, FH, GREM1, HOXB13, KIT, MEN1, MLH1, MSH2, MSH3, MSH6, MUTYH, NF1, NTHL1, PALB2, PDGFRA, PMS2, POLD1, POLE, PTEN, RAD51C, RAD51D, SDHA, SDHB, SDHC, SDHD, SMAD4, SMARCA4, STK11, TP53, TSC1, TSC2, VHL.     04/2023 -  Chemotherapy   Started Xtandi 160 mg.   06/29/2023 - 06/29/2023 Chemotherapy   Patient is on Treatment Plan : PROSTATE Docetaxel  (50) q14d       REVIEW OF SYSTEMS:   All relevant systems were reviewed with the patient and are negative.   VITALS:  Blood pressure (!) 140/84, pulse 79, temperature 97.7 F (36.5 C), temperature source Temporal, resp. rate 15, weight 211 lb 8  oz (95.9 kg), SpO2 98%.  Wt Readings from Last 3 Encounters:  10/29/23 211 lb 8 oz (95.9 kg)  09/14/23 212 lb 4.8 oz (96.3 kg)  07/27/23 204 lb 1.6 oz (92.6 kg)    Body mass index is 32.16 kg/m.  Performance status (ECOG): 2 - Symptomatic, <50% confined to bed  PHYSICAL EXAM:   GENERAL:alert, no distress and comfortable  LABORATORY DATA:  I have reviewed the data as listed    Component Value Date/Time   NA 139 10/29/2023 0817   NA 138 11/03/2015 0835   K 3.8 10/29/2023 0817   CL 106 10/29/2023 0817   CO2 27 10/29/2023 0817   GLUCOSE 158 (H) 10/29/2023 0817   BUN 24 (H) 10/29/2023 0817   BUN 16 11/03/2015 0835   CREATININE 0.74 10/29/2023 0817   CREATININE 0.71 12/25/2022 0825   CALCIUM 9.5 10/29/2023 0817   PROT 7.2 10/29/2023 0817   PROT 7.1 11/03/2015 0835    ALBUMIN 4.3 10/29/2023 0817   ALBUMIN 4.4 11/03/2015 0835   AST 17 10/29/2023 0817   ALT 16 10/29/2023 0817   ALKPHOS 50 10/29/2023 0817   BILITOT 0.5 10/29/2023 0817   GFRNONAA >60 10/29/2023 0817   GFRNONAA 87 11/12/2020 1158   GFRAA 101 11/12/2020 1158    No results found for: "SPEP", "UPEP"  Lab Results  Component Value Date   WBC 4.4 10/29/2023   NEUTROABS 2.4 10/29/2023   HGB 13.7 10/29/2023   HCT 40.7 10/29/2023   MCV 98.1 10/29/2023   PLT 183 10/29/2023      Chemistry      Component Value Date/Time   NA 139 10/29/2023 0817   NA 138 11/03/2015 0835   K 3.8 10/29/2023 0817   CL 106 10/29/2023 0817   CO2 27 10/29/2023 0817   BUN 24 (H) 10/29/2023 0817   BUN 16 11/03/2015 0835   CREATININE 0.74 10/29/2023 0817   CREATININE 0.71 12/25/2022 0825      Component Value Date/Time   CALCIUM 9.5 10/29/2023 0817   ALKPHOS 50 10/29/2023 0817   AST 17 10/29/2023 0817   ALT 16 10/29/2023 0817   BILITOT 0.5 10/29/2023 0817       RADIOGRAPHIC STUDIES: I have personally reviewed the radiological images as listed and agreed with the findings in the report. No results found.

## 2023-10-30 LAB — PROSTATE-SPECIFIC AG, SERUM (LABCORP): Prostate Specific Ag, Serum: 0.7 ng/mL (ref 0.0–4.0)

## 2023-10-30 LAB — TESTOSTERONE: Testosterone: 14 ng/dL — ABNORMAL LOW (ref 264–916)

## 2023-11-01 ENCOUNTER — Ambulatory Visit (INDEPENDENT_AMBULATORY_CARE_PROVIDER_SITE_OTHER): Payer: Medicare PPO | Admitting: Orthopedic Surgery

## 2023-11-01 ENCOUNTER — Encounter: Payer: Self-pay | Admitting: Orthopedic Surgery

## 2023-11-01 VITALS — BP 122/78 | HR 77 | Temp 97.1°F | Ht 68.0 in | Wt 215.0 lb

## 2023-11-01 DIAGNOSIS — M85851 Other specified disorders of bone density and structure, right thigh: Secondary | ICD-10-CM | POA: Diagnosis not present

## 2023-11-01 DIAGNOSIS — I10 Essential (primary) hypertension: Secondary | ICD-10-CM

## 2023-11-01 DIAGNOSIS — C7951 Secondary malignant neoplasm of bone: Secondary | ICD-10-CM

## 2023-11-01 DIAGNOSIS — E782 Mixed hyperlipidemia: Secondary | ICD-10-CM

## 2023-11-01 DIAGNOSIS — C61 Malignant neoplasm of prostate: Secondary | ICD-10-CM | POA: Diagnosis not present

## 2023-11-01 DIAGNOSIS — R7303 Prediabetes: Secondary | ICD-10-CM

## 2023-11-01 DIAGNOSIS — I7143 Infrarenal abdominal aortic aneurysm, without rupture: Secondary | ICD-10-CM

## 2023-11-01 DIAGNOSIS — F4321 Adjustment disorder with depressed mood: Secondary | ICD-10-CM

## 2023-11-01 MED ORDER — TRAZODONE HCL 150 MG PO TABS: 150.0000 mg | ORAL_TABLET | Freq: Every day | ORAL | 2 refills | Status: DC

## 2023-11-01 NOTE — Progress Notes (Signed)
 Careteam: Patient Care Team: Arnetha Bhat, NP as PCP - General (Adult Health Nurse Practitioner) Alvina Axon, MD as Consulting Physician (Ophthalmology) Arnetha Bhat, NP as Nurse Practitioner (Adult Health Nurse Practitioner) Katheleen Palmer, RN as Oncology Nurse Navigator  Seen by: Ulyses Gandy, AGNP-C  PLACE OF SERVICE:  [facility]  Advanced Directive information    Allergies  Allergen Reactions   Oxycontin [Oxycodone Hcl] Hives and Shortness Of Breath   Penicillins Hives and Shortness Of Breath   Celebrex [Celecoxib]    Erythromycin    Tetracyclines & Related     Chief Complaint  Patient presents with   Medical Management of Chronic Issues    6 month follow up. Discuss the need for DTAP vaccine, and AWV (pending for 11/08/23).     HPI: Patient is a 78 y.o. male seen today for medical management of chronic conditions.    Discussed the use of AI scribe software for clinical note transcription with the patient, who gave verbal consent to proceed.  History of Present Illness    He recently completed radiation treatments for prostate cancer with mets and is awaiting results from a recent blood work 04/21 to determine if chemotherapy is necessary. His recent prostate-specific marker was within normal range.  He continues to have foley due to urinary retention secondary to prostate cancer.  April 18th he went to urgent care due to concerns about a urinary tract infection (UTI) and was prescribed Cipro  x days. There is improvement in his urine color, noting it is bright yellow in the morning and clears up later in the day. He has increased his water  intake to three to four bottles a day to help prevent further UTIs.  He reports increased depression related to cancer diagnosis and insomnia. He is currently taking Trazodone  100 mg at bedtime. No plan to harm self. Requesting increase in medication today.   He is on losartan  for blood pressure management and simvastatin  for  cholesterol, which he takes at night.   He is also taking vitamin D for osteopenia. He has not started calcium supplement yet.   Discussed need for Tdap vaccination.     Review of Systems:  Review of Systems  Constitutional: Negative.   HENT: Negative.    Respiratory:  Positive for shortness of breath.   Cardiovascular: Negative.   Gastrointestinal:  Positive for constipation and diarrhea. Negative for blood in stool.  Genitourinary:  Negative for flank pain and hematuria.  Musculoskeletal:  Negative for falls and joint pain.  Skin: Negative.   Neurological: Negative.   Psychiatric/Behavioral:  Positive for depression. Negative for memory loss and suicidal ideas. The patient has insomnia. The patient is not nervous/anxious.     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    Elevated PSA    Flatulence, eructation, and gas pain    Headache(784.0)    Hemorrhoids, external    Herpes zoster with unspecified complication    Hyperpotassemia    Insomnia, unspecified    Lumbago    Lumbago    Myalgia and myositis, unspecified    Nocturia    Obesity, unspecified  Osteoarthrosis, unspecified whether generalized or localized, unspecified site    Other abnormal blood chemistry    Other headache syndromes(339.89)    Pre-diabetes    Pure hyperglyceridemia    Rash and other nonspecific skin eruption    Rotator cuff (capsule) sprain    Routine general medical examination at a health care facility    Tobacco use disorder    Unspecified essential hypertension    Unspecified hypertrophic and atrophic condition of skin    Unspecified sleep apnea    Past  Surgical History:  Procedure Laterality Date   CYSTOSCOPY N/A 08/22/2022   Procedure: CYSTOSCOPY;  Surgeon: Sherlyn Ditto, MD;  Location: [facility];  Service: Urology;  Laterality: N/A;   DENTAL SURGERY  05/10/2021   Per patient   HEMORRHOID SURGERY     PROSTATE BIOPSY     TRANSURETHRAL RESECTION OF PROSTATE N/A 08/22/2022   Procedure: TRANSURETHRAL RESECTION OF THE PROSTATE (TURP);  Surgeon: Sherlyn Ditto, MD;  Location: [facility];  Service: Urology;  Laterality: N/A;   tumorectomy Left    Removed tumor from his chest.   VARICOSE VEIN SURGERY     Social History:   reports that he quit smoking about 8 years ago. His smoking use included cigarettes. He started smoking about 63 years ago. He has a 82.5 pack-year smoking history. He has never used smokeless tobacco. He reports that he does not drink alcohol and does not use drugs.  Family History  Problem Relation Age of Onset   Diabetes Mother    Heart disease Mother    Stroke Mother    Stroke Father    Leukemia Maternal Aunt        dx >50   Breast cancer Paternal Aunt        dx >50   Stomach cancer Cousin        d. 76; pat male cousin    Medications: Patient's Medications  New Prescriptions   No medications on file  Previous Medications   ACETAMINOPHEN  (TYLENOL ) 325 MG TABLET    Take 650 mg by mouth every 6 (six) hours as needed (pain and sleep aid).   CETIRIZINE  (ZYRTEC ) 10 MG TABLET    Take 1 tablet (10 mg total) by mouth daily.   CIPROFLOXACIN  (CIPRO ) 500 MG TABLET    Take 1 tablet (500 mg total) by mouth every 12 (twelve) hours.   DAROLUTAMIDE  (NUBEQA ) 300 MG TABLET    Take 2 tablets (600 mg total) by mouth 2 (two) times daily with a meal.   FINASTERIDE  (PROSCAR ) 5 MG TABLET    TAKE 1 TABLET(5 MG) BY MOUTH DAILY   LOSARTAN  (COZAAR ) 100 MG TABLET    TAKE 1 TABLET BY MOUTH DAILY   SILODOSIN (RAPAFLO) 8 MG CAPS CAPSULE    Take 8 mg by mouth daily with breakfast.   SIMVASTATIN   (ZOCOR ) 20 MG TABLET    TAKE 1 TABLET(20 MG) BY MOUTH DAILY AT 6 PM   TRAZODONE  (DESYREL ) 100 MG TABLET    TAKE 1 TABLET(100 MG) BY MOUTH AT BEDTIME   TRIAMCINOLONE  CREAM (KENALOG ) 0.1 %    Apply 1 Application topically 2 (two) times daily.  Modified Medications   No medications on file  Discontinued Medications   No medications on file    Physical Exam:  There were no vitals filed for this visit. There is no height or weight on file to calculate BMI. Wt Readings from Last 3 Encounters:  10/29/23 211 lb  8 oz (95.9 kg)  09/14/23 212 lb 4.8 oz (96.3 kg)  07/27/23 204 lb 1.6 oz (92.6 kg)    Physical Exam Vitals reviewed.  Constitutional:      General: He is not in acute distress. HENT:     Head: Normocephalic.  Eyes:     General:        Right eye: No discharge.        Left eye: No discharge.  Cardiovascular:     Rate and Rhythm: Normal rate and regular rhythm.     Pulses: Normal pulses.     Heart sounds: Normal heart sounds.  Pulmonary:     Effort: Pulmonary effort is normal.     Breath sounds: Normal breath sounds.  Abdominal:     General: Bowel sounds are normal.     Palpations: Abdomen is soft.  Genitourinary:    Comments: Indewelling foley Musculoskeletal:        General: Normal range of motion.     Cervical back: Neck supple.  Skin:    General: Skin is warm.     Capillary Refill: Capillary refill takes less than 2 seconds.  Neurological:     General: No focal deficit present.     Mental Status: He is alert and oriented to person, place, and time.     Gait: Gait normal.  Psychiatric:        Mood and Affect: Mood normal.     Labs reviewed: Basic Metabolic Panel: Recent Labs    07/27/23 1050 09/14/23 0727 10/29/23 0817  NA 140 139 139  K 4.9 3.6 3.8  CL 106 106 106  CO2 28 26 27   GLUCOSE 140* 122* 158*  BUN 17 22 24*  CREATININE 0.64 0.59* 0.74  CALCIUM 9.3 8.7* 9.5   Liver Function Tests: Recent Labs    07/27/23 1050 09/14/23 0727  10/29/23 0817  AST 13* 17 17  ALT 7 19 16   ALKPHOS 56 58 50  BILITOT 0.4 0.4 0.5  PROT 7.4 6.8 7.2  ALBUMIN 4.2 4.1 4.3   No results for input(s): "LIPASE", "AMYLASE" in the last 8760 hours. No results for input(s): "AMMONIA" in the last 8760 hours. CBC: Recent Labs    07/27/23 1050 09/14/23 0727 10/29/23 0817  WBC 4.3 4.0 4.4  NEUTROABS 2.9 2.1 2.4  HGB 13.9 13.5 13.7  HCT 42.3 40.5 40.7  MCV 95.9 96.0 98.1  PLT 180 208 183   Lipid Panel: Recent Labs    12/25/22 0825  CHOL 106  HDL 33*  LDLCALC 52  TRIG 161  CHOLHDL 3.2   TSH: No results for input(s): "TSH" in the last 8760 hours. A1C: Lab Results  Component Value Date   HGBA1C 6.2 (H) 12/25/2022     Assessment/Plan 1. Prostate cancer metastatic to bone [facility]) (Primary) - followed by Dr. Alita Irwin - completed radiation - awaiting lab work results to determine chemo  2. Essential hypertension - controlled with losartan   3. Mixed hyperlipidemia - cont simvastatin  - Lipid Panel; Future  4. Osteopenia of neck of right femur - DEXA 03/2023, t score -1.8 right femur neck - on vitamin D - advised to add calcium supplement> discussed Caltrate  5. Infrarenal abdominal aortic aneurysm (AAA) without rupture (HCC) - followed by vascular - repeat U/S 11/16/2023  6. Situational depression - ongoing - associated with prostate cancer and caregiver for wife - will increase Trazodone  150 mg at bedtime> tolerated well in past - traZODone  (DESYREL ) 150 MG tablet; Take 1 tablet (  150 mg total) by mouth at bedtime.  Dispense: 90 tablet; Refill: 2  7. Prediabetes - A1c 6.2 12/2022 - not on medication - diet controlled - Hemoglobin A1c; Future  Total time: 32 minutes. Greater than 50% of total time spent doing patient education regarding prostate cancer, HTN, HLD, prediabetes, depression and insomnia including symptom/medication management.    Next appt: 11/08/2023  Ulyses Gandy, Joyice Nodal  [facility] [phone]

## 2023-11-01 NOTE — Patient Instructions (Addendum)
 Please get tetanus at local pharmacy> good for 10 years  Trazodone  increased to 150 mg every night   HIDE TRAZODONE  FROM WIFE!!!  Drink adequate water  daily to prevent urine infection  Wash tip of foley with soap and water    Consider cranberry supplement daily for urine infection prevention

## 2023-11-02 NOTE — Progress Notes (Signed)
 Patient received ADT, Eligard  45mg , 6 month dose at [facility] on 12/18.    Upcoming catheter change set up for 5/15.   Will follow to ensure next ADT injection.

## 2023-11-08 ENCOUNTER — Encounter: Payer: Self-pay | Admitting: Orthopedic Surgery

## 2023-11-08 ENCOUNTER — Ambulatory Visit (INDEPENDENT_AMBULATORY_CARE_PROVIDER_SITE_OTHER): Payer: Medicare PPO | Admitting: Orthopedic Surgery

## 2023-11-08 VITALS — BP 127/76 | HR 60 | Temp 97.3°F | Resp 18 | Wt 217.6 lb

## 2023-11-08 DIAGNOSIS — E782 Mixed hyperlipidemia: Secondary | ICD-10-CM | POA: Diagnosis not present

## 2023-11-08 DIAGNOSIS — Z0000 Encounter for general adult medical examination without abnormal findings: Secondary | ICD-10-CM | POA: Diagnosis not present

## 2023-11-08 DIAGNOSIS — R7303 Prediabetes: Secondary | ICD-10-CM | POA: Diagnosis not present

## 2023-11-08 NOTE — Patient Instructions (Signed)
  David [name] , Thank you for taking time to come for your Medicare Wellness Visit. I appreciate your ongoing commitment to your health goals. Please review the following plan we discussed and let me know if I can assist you in the future.   These are the goals we discussed:  Goals      lose weight     Starting 11/16/16 I will start trying to walk to lose weight.      Maintain Mobility and Function     Evidence-based guidance:  Acknowledge and validate impact of pain, loss of strength and potential disfigurement (hand osteoarthritis) on mental health and daily life, such as social isolation, anxiety, depression, impaired sexual relationship and   injury from falls.  Anticipate referral to physical or occupational therapy for assessment, therapeutic exercise and recommendation for adaptive equipment or assistive devices; encourage participation.  Assess impact on ability to perform activities of daily living, as well as engage in sports and leisure events or requirements of work or school.  Provide anticipatory guidance and reassurance about the benefit of exercise to maintain function; acknowledge and normalize fear that exercise may worsen symptoms.  Encourage regular exercise, at least 10 minutes at a time for 45 minutes per week; consider yoga, water  exercise and proprioceptive exercises; encourage use of wearable activity tracker to increase motivation and adherence.  Encourage maintenance or resumption of daily activities, including employment, as pain allows and with minimal exposure to trauma.  Assist patient to advocate for adaptations to the work environment.  Consider level of pain and function, gender, age, lifestyle, patient preference, quality of life, readiness and ?ocapacity to benefit? when recommending patients for orthopaedic surgery consultation.  Explore strategies, such as changes to medication regimen or activity that enables patient to anticipate and manage flare-ups  that increase deconditioning and disability.  Explore patient preferences; encourage exposure to a broader range of activities that have been avoided for fear of experiencing pain.  Identify barriers to participation in therapy or exercise, such as pain with activity, anticipated or imagined pain.  Monitor postoperative joint replacement or any preexisting joint replacement for ongoing pain and loss of function; provide social support and encouragement throughout recovery.   Notes:      Quit smoking / using tobacco     Has quit smoking since 2017         This is a list of the screening recommended for you and due dates:  Health Maintenance  Topic Date Due   Screening for Lung Cancer  05/02/2024*   DTaP/Tdap/Td vaccine (3 - Tdap) 05/02/2024*   Flu Shot  02/08/2024   Medicare Annual Wellness Visit  11/07/2024   Pneumonia Vaccine  Completed   Hepatitis C Screening  Completed   Zoster (Shingles) Vaccine  Completed   HPV Vaccine  Aged Out   Meningitis B Vaccine  Aged Out   Colon Cancer Screening  Discontinued   COVID-19 Vaccine  Discontinued  *Topic was postponed. The date shown is not the original due date.

## 2023-11-08 NOTE — Progress Notes (Signed)
 Subjective:   David [name]. is a 78 y.o. male who presents for Medicare Annual/Subsequent preventive examination.  Visit Complete: In person  Patient Medicare AWV questionnaire was completed by the patient on 11/08/2023; I have confirmed that all information answered by patient is correct and no changes since this date.  Cardiac Risk Factors include: advanced age (>38men, >42 women);hypertension;male gender;obesity (BMI >30kg/m2);sedentary lifestyle     Objective:    Today's Vitals   11/08/23 0854 11/08/23 0857  BP: 127/76   Pulse: 60   Resp: 18   Temp: (!) 97.3 F (36.3 C)   SpO2: 96%   Weight: 217 lb 9.6 oz (98.7 kg)   PainSc:  0-No pain   Body mass index is 33.09 kg/m.     05/03/2023    8:40 AM 04/16/2023   12:43 PM 12/28/2022    8:22 AM 10/19/2022    8:51 AM 08/31/2022    8:56 AM 08/22/2022    9:23 AM 04/27/2022    8:52 AM  Advanced Directives  Does Patient Have a Medical Advance Directive? No No No No No No No  Would patient like information on creating a medical advance directive? No - Patient declined No - Patient declined No - Patient declined No - Patient declined No - Patient declined No - Patient declined     Current Medications (verified) Outpatient Encounter Medications as of 11/08/2023  Medication Sig   acetaminophen  (TYLENOL ) 325 MG tablet Take 650 mg by mouth every 6 (six) hours as needed (pain and sleep aid).   cetirizine  (ZYRTEC ) 10 MG tablet Take 1 tablet (10 mg total) by mouth daily.   Cholecalciferol (VITAMIN D3) 25 MCG (1000 UT) CAPS Take 1 capsule by mouth daily.   ciprofloxacin  (CIPRO ) 500 MG tablet Take 1 tablet (500 mg total) by mouth every 12 (twelve) hours.   darolutamide  (NUBEQA ) 300 MG tablet Take 2 tablets (600 mg total) by mouth 2 (two) times daily with a meal.   finasteride  (PROSCAR ) 5 MG tablet TAKE 1 TABLET(5 MG) BY MOUTH DAILY   losartan  (COZAAR ) 100 MG tablet TAKE 1 TABLET BY MOUTH DAILY   silodosin (RAPAFLO) 8 MG CAPS  capsule Take 8 mg by mouth daily with breakfast.   simvastatin  (ZOCOR ) 20 MG tablet TAKE 1 TABLET(20 MG) BY MOUTH DAILY AT 6 PM   traZODone  (DESYREL ) 150 MG tablet Take 1 tablet (150 mg total) by mouth at bedtime.   triamcinolone  cream (KENALOG ) 0.1 % Apply 1 Application topically 2 (two) times daily.   No facility-administered encounter medications on file as of 11/08/2023.    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    Elevated PSA    Flatulence, eructation, and gas pain    Headache(784.0)    Hemorrhoids, external    Herpes zoster with unspecified complication    Hyperpotassemia    Insomnia, unspecified    Lumbago    Lumbago    Myalgia and myositis, unspecified    Nocturia    Obesity, unspecified    Osteoarthrosis, unspecified whether generalized  or localized, unspecified site    Other abnormal blood chemistry    Other headache syndromes(339.89)    Pre-diabetes    Pure hyperglyceridemia    Rash and other nonspecific skin eruption    Rotator cuff (capsule) sprain    Routine general medical examination at a health care facility    Tobacco use disorder    Unspecified essential hypertension    Unspecified hypertrophic and atrophic condition of skin    Unspecified sleep apnea    Past Surgical History:  Procedure Laterality Date   CYSTOSCOPY N/A 08/22/2022   Procedure: CYSTOSCOPY;  Surgeon: Sherlyn Ditto, MD;  Location: [facility];  Service: Urology;  Laterality: N/A;   DENTAL SURGERY  05/10/2021    Per patient   HEMORRHOID SURGERY     PROSTATE BIOPSY     TRANSURETHRAL RESECTION OF PROSTATE N/A 08/22/2022   Procedure: TRANSURETHRAL RESECTION OF THE PROSTATE (TURP);  Surgeon: Sherlyn Ditto, MD;  Location: [facility];  Service: Urology;  Laterality: N/A;   tumorectomy Left    Removed tumor from his chest.   VARICOSE VEIN SURGERY     Family History  Problem Relation Age of Onset   Diabetes Mother    Heart disease Mother    Stroke Mother    Stroke Father    Leukemia Maternal Aunt        dx >50   Breast cancer Paternal Aunt        dx >50   Stomach cancer Cousin        d. 99; pat male cousin   Social History   Socioeconomic History   Marital status: Married    Spouse name: Not on file   Number of children: Not on file   Years of education: Not on file   Highest education level: Not on file  Occupational History   Not on file  Tobacco Use   Smoking status: Former    Current packs/day: 0.00    Average packs/day: 1.5 packs/day for 55.0 years (82.5 ttl pk-yrs)    Types: Cigarettes    Start date: 07/29/1960    Quit date: 07/30/2015    Years since quitting: 8.2   Smokeless tobacco: Never   Tobacco comments:    pt thinks increased effexor  dose helped him to quit  Vaping Use   Vaping status: Never Used  Substance and Sexual Activity   Alcohol use: No    Alcohol/week: 0.0 standard drinks of alcohol   Drug use: No   Sexual activity: Not Currently  Other Topics Concern   Not on file  Social History Narrative   Married   Stopped smoking 07/30/15   Alcohol none   Exercise none   [employer] [phone] 1987    Social Drivers of Health   Financial Resource Strain: Medium Risk (11/08/2023)   Overall Financial Resource Strain (CARDIA)    Difficulty of Paying Living Expenses: Somewhat hard  Food Insecurity: Food Insecurity Present (11/08/2023)   Hunger Vital Sign    Worried About Running Out of Food in the Last Year: Sometimes true    Ran Out of Food in the  Last Year: Sometimes true  Transportation Needs: No Transportation Needs (11/08/2023)   PRAPARE - Administrator, Civil Service (Medical): No    Lack of Transportation (Non-Medical): No  Physical Activity: Inactive (11/08/2023)   Exercise Vital Sign    Days of Exercise per Week: 0 days    Minutes of Exercise per  Session: 0 min  Stress: No Stress Concern Present (11/08/2023)   [employer] of Occupational Health - Occupational Stress Questionnaire    Feeling of Stress : Only a little  Social Connections: Socially Isolated (11/08/2023)   Social Connection and Isolation Panel [NHANES]    Frequency of Communication with Friends and Family: Once a week    Frequency of Social Gatherings with Friends and Family: Once a week    Attends Religious Services: Never    Database administrator or Organizations: No    Attends Engineer, structural: Never    Marital Status: Married    Tobacco Counseling Counseling given: Not Answered Tobacco comments: pt thinks increased effexor  dose helped him to quit   Clinical Intake:  Pre-visit preparation completed: Yes  Pain : No/denies pain Pain Score: 0-No pain     BMI - recorded: 33.09 Nutritional Status: BMI > 30  Obese Nutritional Risks: None Diabetes: Yes CBG done?: No Did pt. bring in CBG monitor from home?: No  How often do you need to have someone help you when you read instructions, pamphlets, or other written materials from your doctor or pharmacy?: 2 - Rarely What is the last grade level you completed in school?: High School  Interpreter Needed?: No      Activities of Daily Living    11/08/2023    9:00 AM 11/08/2023    8:48 AM  In your present state of health, do you have any difficulty performing the following activities:  Hearing?  0  Vision?  0  Difficulty concentrating or making decisions?  0  Walking or climbing stairs?  0  Dressing or bathing?  0  Doing errands, shopping?  0  Preparing Food and eating  ? N   Using the Toilet? N   In the past six months, have you accidently leaked urine? Y   Comment indwelling foley   Do you have problems with loss of bowel control? Y   Managing your Medications? N   Managing your Finances? N   Housekeeping or managing your Housekeeping? N     Patient Care Team: Arnetha Bhat, NP as PCP - General (Adult Health Nurse Practitioner) Alvina Axon, MD as Consulting Physician (Ophthalmology) Arnetha Bhat, NP as Nurse Practitioner (Adult Health Nurse Practitioner) Katheleen Palmer, RN as Oncology Nurse Navigator  Indicate any recent Medical Services you may have received from other than [facility] providers in the past year (date may be approximate).     Assessment:   This is a routine wellness examination for Jakiem.  Hearing/Vision screen No results found.   Goals Addressed             This Visit's Progress    lose weight   Not on track    Starting 11/16/16 I will start trying to walk to lose weight.      Maintain Mobility and Function   On track    Evidence-based guidance:  Acknowledge and validate impact of pain, loss of strength and potential disfigurement (hand osteoarthritis) on mental health and daily life, such as social isolation, anxiety, depression, impaired sexual relationship and   injury from falls.  Anticipate referral to physical or occupational therapy for assessment, therapeutic exercise and recommendation for adaptive equipment or assistive devices; encourage participation.  Assess impact on ability to perform activities of daily living, as well as engage in sports and leisure events or requirements of work or school.  Provide anticipatory guidance and reassurance about the benefit of  exercise to maintain function; acknowledge and normalize fear that exercise may worsen symptoms.  Encourage regular exercise, at least 10 minutes at a time for 45 minutes per week; consider yoga, water  exercise and proprioceptive exercises; encourage use of  wearable activity tracker to increase motivation and adherence.  Encourage maintenance or resumption of daily activities, including employment, as pain allows and with minimal exposure to trauma.  Assist patient to advocate for adaptations to the work environment.  Consider level of pain and function, gender, age, lifestyle, patient preference, quality of life, readiness and ?ocapacity to benefit? when recommending patients for orthopaedic surgery consultation.  Explore strategies, such as changes to medication regimen or activity that enables patient to anticipate and manage flare-ups that increase deconditioning and disability.  Explore patient preferences; encourage exposure to a broader range of activities that have been avoided for fear of experiencing pain.  Identify barriers to participation in therapy or exercise, such as pain with activity, anticipated or imagined pain.  Monitor postoperative joint replacement or any preexisting joint replacement for ongoing pain and loss of function; provide social support and encouragement throughout recovery.   Notes:      Quit smoking / using tobacco   On track    Has quit smoking since 2017        Depression Screen    11/08/2023    8:51 AM 11/01/2023   11:01 AM 05/03/2023    8:41 AM 04/16/2023   12:55 PM 10/19/2022    9:07 AM 10/19/2022    8:46 AM 01/05/2022    9:57 AM  PHQ 2/9 Scores  PHQ - 2 Score 0 2 0 2 0 0 1  PHQ- 9 Score 0 4     2    Fall Risk    11/08/2023    8:50 AM 05/03/2023    8:41 AM 12/28/2022    8:22 AM 10/19/2022    9:08 AM 10/19/2022    8:46 AM  Fall Risk   Falls in the past year? 1 1 0 0 0  Number falls in past yr: 1 0 0 0 0  Injury with Fall? 0 0 0 0 0  Risk for fall due to : History of fall(s)  No Fall Risks No Fall Risks No Fall Risks  Follow up Falls evaluation completed;Education provided;Falls prevention discussed  Falls evaluation completed Falls evaluation completed;Education provided;Falls prevention discussed  Falls evaluation completed    MEDICARE RISK AT HOME: Medicare Risk at Home Any stairs in or around the home?: Yes If so, are there any without handrails?: No Home free of loose throw rugs in walkways, pet beds, electrical cords, etc?: Yes Adequate lighting in your home to reduce risk of falls?: Yes Life alert?: No Use of a cane, walker or w/c?: No Grab bars in the bathroom?: No Shower chair or bench in shower?: No Elevated toilet seat or a handicapped toilet?: Yes  TIMED UP AND GO:  Was the test performed?  Yes  Length of time to ambulate 10 feet: 5 sec Gait steady and fast without use of assistive device    Cognitive Function:    10/19/2022    8:46 AM 01/05/2022    9:59 AM 12/31/2020    9:51 AM 12/24/2018    9:36 AM 12/20/2017    8:37 AM  MMSE - Mini Mental State Exam  Orientation to time 5 5 5 5 5   Orientation to Place 5 5 5 5 5   Registration 3 3 3 3 3   Attention/ Calculation  5 5 5 5 5   Recall 2 3 2 3 3   Language- name 2 objects 2 2 2 2 2   Language- repeat 1 1 1 1 1   Language- follow 3 step command 3 3 3 3 3   Language- read & follow direction 1 1 1 1 1   Write a sentence 1 1 1 1 1   Copy design 1 1 1 1 1   Total score 29 30 29 30 30         11/08/2023    8:51 AM 12/30/2019    9:15 AM  6CIT Screen  What Year? 0 points 0 points  What month? 0 points 0 points  What time? 0 points 0 points  Count back from 20 0 points 0 points  Months in reverse 0 points 2 points  Repeat phrase 4 points 0 points  Total Score 4 points 2 points    Immunizations Immunization History  Administered Date(s) Administered   Fluad Quad(high Dose 65+) 03/31/2019, 04/05/2020, 06/09/2021, 04/27/2022   Fluad Trivalent(High Dose 65+) 05/03/2023   Influenza, High Dose Seasonal PF 04/03/2017, 05/02/2017, 03/28/2018, 05/07/2018, 05/08/2019, 05/10/2020   Influenza,inj,Quad PF,6+ Mos 06/13/2013, 04/10/2014, 05/06/2015, 05/12/2016   Influenza-Unspecified 04/09/2010, 05/09/2012   Moderna Covid-19  Vaccine Bivalent Booster 14yrs & up 05/06/2021   PFIZER(Purple Top)SARS-COV-2 Vaccination 09/07/2019, 10/07/2019, 05/06/2020   Pneumococcal Conjugate-13 04/16/2014   Pneumococcal Polysaccharide-23 01/26/2011, 11/25/2015, 05/02/2017, 05/07/2018, 05/08/2019, 05/10/2020, 05/10/2021, 05/11/2023   Td 07/10/2000, 06/13/2013   Zoster Recombinant(Shingrix) 11/20/2016, 03/28/2017   Zoster, Live 08/05/2012    TDAP status: Due, Education has been provided regarding the importance of this vaccine. Advised may receive this vaccine at local pharmacy or Health Dept. Aware to provide a copy of the vaccination record if obtained from local pharmacy or Health Dept. Verbalized acceptance and understanding.  Flu Vaccine status: Up to date  Pneumococcal vaccine status: Up to date  Covid-19 vaccine status: Completed vaccines  Qualifies for Shingles Vaccine? Yes   Zostavax completed Yes   Shingrix Completed?: Yes  Screening Tests Health Maintenance  Topic Date Due   Lung Cancer Screening  05/02/2024 (Originally 06/24/1995)   DTaP/Tdap/Td (3 - Tdap) 05/02/2024 (Originally 06/14/2023)   INFLUENZA VACCINE  02/08/2024   Medicare Annual Wellness (AWV)  11/07/2024   Pneumonia Vaccine 63+ Years old  Completed   Hepatitis C Screening  Completed   Zoster Vaccines- Shingrix  Completed   HPV VACCINES  Aged Out   Meningococcal B Vaccine  Aged Out   Colonoscopy  Discontinued   COVID-19 Vaccine  Discontinued    Health Maintenance  There are no preventive care reminders to display for this patient.   Colorectal cancer screening: No longer required.   Lung Cancer Screening: (Low Dose CT Chest recommended if Age 44-80 years, 20 pack-year currently smoking OR have quit w/in 15years.) does not qualify.   Lung Cancer Screening Referral: No  Additional Screening:  Hepatitis C Screening: does not qualify; Completed   Vision Screening: Recommended annual ophthalmology exams for early detection of glaucoma and  other disorders of the eye. Is the patient up to date with their annual eye exam?  Yes  Who is the provider or what is the name of the office in which the patient attends annual eye exams? Dr. Terrall Ferraris If pt is not established with a provider, would they like to be referred to a provider to establish care? No .   Dental Screening: Recommended annual dental exams for proper oral hygiene  Diabetic Foot Exam: Diabetic Foot Exam: Completed 11/08/2023  Community Resource Referral / Chronic Care Management: CRR required this visit?  No   CCM required this visit?  No     Plan:     I have personally reviewed and noted the following in the patient's chart:   Medical and social history Use of alcohol, tobacco or illicit drugs  Current medications and supplements including opioid prescriptions. Patient is not currently taking opioid prescriptions. Functional ability and status Nutritional status Physical activity Advanced directives List of other physicians Hospitalizations, surgeries, and ER visits in previous 12 months Vitals Screenings to include cognitive, depression, and falls Referrals and appointments  In addition, I have reviewed and discussed with patient certain preventive protocols, quality metrics, and best practice recommendations. A written personalized care plan for preventive services as well as general preventive health recommendations were provided to patient.     Arnetha Bhat, NP   11/08/2023   After Visit Summary: (MyChart) Due to this being a telephonic visit, the after visit summary with patients personalized plan was offered to patient via MyChart   Nurse Notes: CIT score 4. Plans to get Tdap at local pharmacy. Followed by Dr. Terrall Ferraris for eye exam.

## 2023-11-09 ENCOUNTER — Other Ambulatory Visit: Payer: Self-pay | Admitting: Orthopedic Surgery

## 2023-11-09 DIAGNOSIS — E781 Pure hyperglyceridemia: Secondary | ICD-10-CM

## 2023-11-09 LAB — LIPID PANEL
Cholesterol: 139 mg/dL (ref <200)
HDL: 50 mg/dL (ref >=40)
LDL Cholesterol (Calc): 65 mg/dL
Non-HDL Cholesterol (Calc): 89 mg/dL (ref <130)
Total CHOL/HDL Ratio: 2.8 (calc) (ref <5.0)
Triglycerides: 153 mg/dL — ABNORMAL HIGH (ref <150)

## 2023-11-09 LAB — HEMOGLOBIN A1C
Hgb A1c MFr Bld: 6 % — ABNORMAL HIGH (ref <5.7)
Mean Plasma Glucose: 126 mg/dL
eAG (mmol/L): 7 mmol/L

## 2023-11-14 NOTE — Progress Notes (Signed)
 Patient ID: David [name]., male   DOB: 14-Jul-1945, 78 y.o.   MRN: [id number]  Reason for Consult: Follow-up   Referred by Arnetha Bhat, NP  Subjective:     HPI  David [name]. is a 78 y.o. male presenting for follow-up of infrarenal AAA.  He denies any new or unusual abdominal or back pain.  He is overall doing well.  He continues to undergo treatment for prostate cancer.  He reports that he completed 1 round of chemotherapy but has an appointment with his oncologist next month to discuss the possible need for a second round.  He has also had radiation therapy.  He is a former smoker and quit in 2017.  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    Elevated PSA    Flatulence, eructation, and gas pain    Headache(784.0)    Hemorrhoids, external    Herpes zoster with unspecified complication    Hyperpotassemia    Insomnia, unspecified    Lumbago    Lumbago    Myalgia and myositis, unspecified    Nocturia    Obesity, unspecified    Osteoarthrosis, unspecified whether generalized or localized, unspecified site    Other abnormal blood chemistry    Other headache syndromes(339.89)    Pre-diabetes    Pure hyperglyceridemia    Rash and other nonspecific skin eruption    Rotator cuff (capsule) sprain    Routine general medical examination at a health care facility    Tobacco use disorder    Unspecified essential hypertension    Unspecified hypertrophic and atrophic condition of skin    Unspecified sleep apnea    Family History  Problem Relation Age of Onset   Diabetes Mother    Heart disease Mother    Stroke Mother    Stroke Father     Leukemia Maternal Aunt        dx >50   Breast cancer Paternal Aunt        dx >50   Stomach cancer Cousin        d. 53; pat male cousin   Past Surgical History:  Procedure Laterality Date   CYSTOSCOPY N/A 08/22/2022   Procedure: CYSTOSCOPY;  Surgeon: Sherlyn Ditto, MD;  Location: [facility];  Service: Urology;  Laterality: N/A;   DENTAL SURGERY  05/10/2021   Per patient   HEMORRHOID SURGERY     PROSTATE BIOPSY     TRANSURETHRAL RESECTION OF PROSTATE N/A 08/22/2022   Procedure: TRANSURETHRAL RESECTION OF THE PROSTATE (TURP);  Surgeon: Sherlyn Ditto, MD;  Location: [facility];  Service: Urology;  Laterality: N/A;   tumorectomy Left    Removed tumor from his chest.   VARICOSE VEIN SURGERY      Short Social History:  Social History   Tobacco Use   Smoking status: Former    Current packs/day: 0.00    Average packs/day: 1.5 packs/day for 55.0 years (82.5 ttl pk-yrs)    Types: Cigarettes    Start date: 07/29/1960    Quit date: 07/30/2015    Years since quitting: 8.3  Smokeless tobacco: Never   Tobacco comments:    pt thinks increased effexor  dose helped him to quit  Substance Use Topics   Alcohol use: No    Alcohol/week: 0.0 standard drinks of alcohol    Allergies  Allergen Reactions   Oxycontin [Oxycodone Hcl] Hives and Shortness Of Breath   Penicillins Hives and Shortness Of Breath   Celebrex [Celecoxib]    Erythromycin    Tetracyclines & Related     Current Outpatient Medications  Medication Sig Dispense Refill   acetaminophen  (TYLENOL ) 325 MG tablet Take 650 mg by mouth every 6 (six) hours as needed (pain and sleep aid).     cetirizine  (ZYRTEC ) 10 MG tablet Take 1 tablet (10 mg total) by mouth daily. 30 tablet 11   Cholecalciferol (VITAMIN D3) 25 MCG (1000 UT) CAPS Take 1 capsule by mouth daily.     ciprofloxacin  (CIPRO ) 500 MG tablet Take 1 tablet (500 mg total) by mouth every 12 (twelve) hours. 14 tablet 0   darolutamide   (NUBEQA ) 300 MG tablet Take 2 tablets (600 mg total) by mouth 2 (two) times daily with a meal. 120 tablet 11   finasteride  (PROSCAR ) 5 MG tablet TAKE 1 TABLET(5 MG) BY MOUTH DAILY 90 tablet 3   losartan  (COZAAR ) 100 MG tablet TAKE 1 TABLET BY MOUTH DAILY 90 tablet 3   silodosin (RAPAFLO) 8 MG CAPS capsule Take 8 mg by mouth daily with breakfast.     simvastatin  (ZOCOR ) 20 MG tablet TAKE 1 TABLET(20 MG) BY MOUTH DAILY AT 6 PM 90 tablet 1   traZODone  (DESYREL ) 150 MG tablet Take 1 tablet (150 mg total) by mouth at bedtime. 90 tablet 2   triamcinolone  cream (KENALOG ) 0.1 % Apply 1 Application topically 2 (two) times daily. 30 g 4   No current facility-administered medications for this visit.    REVIEW OF SYSTEMS All other systems were reviewed and are negative     Objective:  Objective   Vitals:   11/16/23 0921  BP: (!) 150/78  Pulse: 64  Resp: 18  Temp: 97.9 F (36.6 C)  TempSrc: Temporal  SpO2: 100%  Weight: 215 lb (97.5 kg)  Height: 5\' 8"  (1.727 m)   Body mass index is 32.69 kg/m.  Physical Exam General: no acute distress Cardiac: hemodynamically stable Pulm: normal work of breathing Abdomen: non-tender, no pulsatile mass palpated due to body habitus Neuro: alert, no focal deficit Extremities: no edema, cyanosis or wounds Vascular:   Right: palpable femoral, PT, radial  Left: palpable femoral, PT, radial   Data: AAA duplex +-------------+-------+----------+----------+---------------------+  Location    AP (cm)Trans (cm)PSV (cm/s)Comments               +-------------+-------+----------+----------+---------------------+  Proximal    2.63   2.43      82                               +-------------+-------+----------+----------+---------------------+  Mid         4.91   5.46      66        Prior: 4.91 x 5.54 cm  +-------------+-------+----------+----------+---------------------+  Distal      2.69   2.78      46                                +-------------+-------+----------+----------+---------------------+  RT CIA Prox  3.5  3.5       24        Prior: 3.6 x 3.4 cm    +-------------+-------+----------+----------+---------------------+  RT EIA Distal1.2    1.2       101                              +-------------+-------+----------+----------+---------------------+  LT CIA Prox  2.1    2.1       75                               +-------------+-------+----------+----------+---------------------+  LT EIA Distal1.2    1.3       111       Prior: 2.5 x 2.2 cm    +-------------+-------+----------+----------+---------------------+  12 months ago it measured 4.9 cm on PET CT.  CMP reviewed, creatinine 0.74  A1c reviewed, 6.0     Assessment/Plan:     Karam Goguen. is a 78 y.o. male with an asymptomatic 5.4 cm AAA on duplex, 1 year ago it measured 4.9 on PET CT in maximal diameter.  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   Based on his previous CT in April I do think that he would be a complex repair given the concomitant right iliac aneurysm as well as the conical infrarenal neck although evaluation is limited as this is not a CTA for that reason we will plan to wait until he is greater than 5.5 cm and I explained that I would like to obtain a CTA in the next few months and at that time if he has completed chemotherapy we will discuss the possibility of aneurysm repair.  We reviewed the red flag symptoms of rupture and he was instructed present to the ED and notify them that he has an abdominal aortic aneurysm if he were to experience any of the symptoms.  SVS AAA Surveillance Guidelines  < 3.0 cm  10 years 3.0 - 3.9 cm  3 years 4.0 - 4.9 cm  1 year   (consider repair at 5cm) 5.0 - 5.4 cm  6 months  Consider repair for women and low surgical risk men > 4.9cm Recommend repair if > 5.4 or growth > 1.0cm/yr  or > 0.5cm/1month  Recommendations to optimize cardiovascular risk: Abstinence from all tobacco products. Blood glucose control with goal A1c < 7%. Blood pressure control with goal blood pressure < 140/90 mmHg. Lipid reduction therapy with goal LDL-C <100 mg/dL  Aspirin 81mg  PO QD.  Atorvastatin 40-80mg  PO QD (or other "high intensity" statin therapy).     Philipp Brawn MD [facility]

## 2023-11-16 ENCOUNTER — Encounter: Payer: Self-pay | Admitting: Vascular Surgery

## 2023-11-16 ENCOUNTER — Ambulatory Visit (HOSPITAL_COMMUNITY)
Admission: RE | Admit: 2023-11-16 | Discharge: 2023-11-16 | Disposition: A | Discharge status: Home / Self Care | Payer: Medicare PPO | Source: Ambulatory Visit | Attending: Vascular Surgery | Admitting: Vascular Surgery

## 2023-11-16 ENCOUNTER — Ambulatory Visit: Payer: Medicare PPO | Attending: Vascular Surgery | Admitting: Vascular Surgery

## 2023-11-16 VITALS — BP 150/78 | HR 64 | Temp 97.9°F | Resp 18 | Ht 68.0 in | Wt 215.0 lb

## 2023-11-16 DIAGNOSIS — I7143 Infrarenal abdominal aortic aneurysm, without rupture: Secondary | ICD-10-CM | POA: Diagnosis present

## 2023-11-17 ENCOUNTER — Other Ambulatory Visit: Payer: Self-pay | Admitting: Orthopedic Surgery

## 2023-11-17 DIAGNOSIS — R338 Other retention of urine: Secondary | ICD-10-CM

## 2023-12-12 ENCOUNTER — Inpatient Hospital Stay

## 2023-12-12 ENCOUNTER — Inpatient Hospital Stay: Admitting: *Deleted

## 2023-12-12 ENCOUNTER — Other Ambulatory Visit: Payer: Self-pay | Admitting: *Deleted

## 2023-12-12 VITALS — BP 157/83 | HR 65 | Temp 97.6°F | Resp 17 | Wt 216.4 lb

## 2023-12-12 DIAGNOSIS — Z9189 Other specified personal risk factors, not elsewhere classified: Secondary | ICD-10-CM

## 2023-12-12 DIAGNOSIS — Z79899 Other long term (current) drug therapy: Secondary | ICD-10-CM | POA: Insufficient documentation

## 2023-12-12 DIAGNOSIS — Z923 Personal history of irradiation: Secondary | ICD-10-CM | POA: Insufficient documentation

## 2023-12-12 DIAGNOSIS — C61 Malignant neoplasm of prostate: Secondary | ICD-10-CM

## 2023-12-12 DIAGNOSIS — C7951 Secondary malignant neoplasm of bone: Secondary | ICD-10-CM | POA: Diagnosis present

## 2023-12-12 DIAGNOSIS — E782 Mixed hyperlipidemia: Secondary | ICD-10-CM | POA: Diagnosis not present

## 2023-12-12 DIAGNOSIS — C7800 Secondary malignant neoplasm of unspecified lung: Secondary | ICD-10-CM | POA: Diagnosis not present

## 2023-12-12 DIAGNOSIS — I10 Essential (primary) hypertension: Secondary | ICD-10-CM | POA: Diagnosis not present

## 2023-12-12 LAB — CBC WITH DIFFERENTIAL (CANCER CENTER ONLY)
Abs Immature Granulocytes: 0.03 10*3/uL (ref 0.00–0.07)
Basophils Absolute: 0.1 10*3/uL (ref 0.0–0.1)
Basophils Relative: 1 %
Eosinophils Absolute: 0.1 10*3/uL (ref 0.0–0.5)
Eosinophils Relative: 2 %
HCT: 40.5 % (ref 39.0–52.0)
Hemoglobin: 13.6 g/dL (ref 13.0–17.0)
Immature Granulocytes: 1 %
Lymphocytes Relative: 27 %
Lymphs Abs: 1.2 10*3/uL (ref 0.7–4.0)
MCH: 32.5 pg (ref 26.0–34.0)
MCHC: 33.6 g/dL (ref 30.0–36.0)
MCV: 96.9 fL (ref 80.0–100.0)
Monocytes Absolute: 0.5 10*3/uL (ref 0.1–1.0)
Monocytes Relative: 10 %
Neutro Abs: 2.7 10*3/uL (ref 1.7–7.7)
Neutrophils Relative %: 59 %
Platelet Count: 178 10*3/uL (ref 150–400)
RBC: 4.18 MIL/uL — ABNORMAL LOW (ref 4.22–5.81)
RDW: 12.8 % (ref 11.5–15.5)
WBC Count: 4.6 10*3/uL (ref 4.0–10.5)
nRBC: 0 % (ref 0.0–0.2)

## 2023-12-12 LAB — CMP (CANCER CENTER ONLY)
ALT: 10 U/L (ref 0–44)
AST: 13 U/L — ABNORMAL LOW (ref 15–41)
Albumin: 4.3 g/dL (ref 3.5–5.0)
Alkaline Phosphatase: 51 U/L (ref 38–126)
Anion gap: 9 (ref 5–15)
BUN: 21 mg/dL (ref 8–23)
CO2: 26 mmol/L (ref 22–32)
Calcium: 9.5 mg/dL (ref 8.9–10.3)
Chloride: 107 mmol/L (ref 98–111)
Creatinine: 0.71 mg/dL (ref 0.61–1.24)
GFR, Estimated: >60 mL/min (ref >60)
Glucose, Bld: 152 mg/dL — ABNORMAL HIGH (ref 70–99)
Potassium: 5 mmol/L (ref 3.5–5.1)
Sodium: 142 mmol/L (ref 135–145)
Total Bilirubin: 0.5 mg/dL (ref 0.0–1.2)
Total Protein: 7 g/dL (ref 6.5–8.1)

## 2023-12-12 NOTE — Progress Notes (Signed)
 [facility] CSW Progress Note  Clinical Child psychotherapist referred by medical oncology. Patient called LPN and notified his water  was shut off. CSW spoke with the patient, he stated he was behind three months on water  bill and his water  was shut off today. CSW made referral to local organization, [facility], for utility assistance. [facility] team paid water  bill immediately on patient's behalf- was notified water  should be turned on at his residence today. CSW followed up with patient again by telephone and provided this information regarding water  services.  CSW and patient discussed strategies to remember when to make monthly payment for daily living expenses.     Jocelyne Mull, LCSW Clinical Social Worker [facility]

## 2023-12-12 NOTE — Assessment & Plan Note (Addendum)
 baseline bone mineral density: 04/06/2023 Right Femur Neck  T= -1.8 osteopenia  calcium (1000-1200 mg daily from food and supplements) and vitamin D3 (1000 IU daily) Control and prevent diabetes Did not have time to address dental evaluation today exercises (30 minutes per day) Limit alcohol consumption and avoid smoking Monitoring control blood pressure

## 2023-12-12 NOTE — Progress Notes (Signed)
 Pt called stating that when he reached home that his water  had been turned off and what can we do to help him? Advised pt that I would reach out to one of the department heads to contact him and navigate the issue from there. Spoke with Barbra Ley and she stated she will call pt to f/u

## 2023-12-12 NOTE — Assessment & Plan Note (Addendum)
 On losartan 100 mg daily

## 2023-12-12 NOTE — Assessment & Plan Note (Addendum)
 Continue ADT (06/27/23 6 mo. Dose) Continue darolutamide  (06/29/23).  He is on low-dose simvastatin , no added toxicity. Return to see me with PSA, testosterone  in about 6-8 weeks Radiation completed on L hip/pelvis 30 Gy/10 fx

## 2023-12-12 NOTE — Progress Notes (Signed)
 [facility] OFFICE PROGRESS NOTE  Patient Care Team: Arnetha Bhat, NP as PCP - General (Adult Health Nurse Practitioner) Alvina Axon, MD as Consulting Physician (Ophthalmology) Arnetha Bhat, NP as Nurse Practitioner (Adult Health Nurse Practitioner) Katheleen Palmer, RN as Oncology Nurse Navigator  Mr. Martensen is a 78 y.o. man with history of hypertension, hyperlipidemia here for follow-up prostate cancer   Current Diagnosis: mHSPC Initial diagnosis: 08/2022. mHSPC with lymph node, bone, lung metastases. Previous treatment: Had one dose of docetaxel . Given the AAA, discuss potential rupture which we cannot predict. After discussion will use lower dose also with his age.  Current Treatment: ADT, darolutamide  (started 06/29/2023). Genetic testing for metastatic prostate cancer: neg. 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   Somatic testing, no actionable mutation.  Variant of SF3B1, TP53, CTNNB1. pMMR. TMB 3.7. No HRR mutation.   Getting ADT through [facility].   Report some burning sensation but since last cath change in May. Will reach out to Urology for evaluation. Report his activities are limited by the catheter and not consuming as much fluid as a result of the catheter. Encouraged him to increase fluid intake to prevent renal injury. Will reach out to Urology.   Report he can see me any day but Tue. Will follow up in July.   Report he can't get through tricare for his coverage and needs help sorting out this medicare coverage. Will reach out to SW. Assessment & Plan Prostate cancer metastatic to bone (HCC) Continue ADT (06/27/23 6 mo. Dose) Continue darolutamide  (06/29/23).  He is on low-dose simvastatin , no added toxicity. Return to see me with PSA, testosterone  in about 6-8 weeks Radiation completed on L hip/pelvis 30 Gy/10 fx At risk for side effect of medication baseline bone mineral density: 04/06/2023 Right Femur Neck  T= -1.8  osteopenia  calcium (1000-1200 mg daily from food and supplements) and vitamin D3 (1000 IU daily) Control and prevent diabetes Did not have time to address dental evaluation today exercises (30 minutes per day) Limit alcohol consumption and avoid smoking Monitoring control blood pressure Mixed hyperlipidemia On simvastatin . No toxicities with daro. Essential hypertension On losartan  100 mg daily   Orders Placed This Encounter  Procedures   CBC with Differential (Cancer Center Only)    Standing Status:   Future    Expiration Date:   12/11/2024   CMP (Cancer Center only)    Standing Status:   Future    Expiration Date:   12/11/2024   Testosterone     Standing Status:   Future    Expiration Date:   12/11/2024   Prostate-Specific AG, Serum    Standing Status:   Future    Expiration Date:   12/11/2024     Lowanda Ruddy, MD  INTERVAL HISTORY: Patient returns for follow-up. Report some burning sensation but since last cath change in May. Some fatigue. He reports not drinking enough due to the need to empty the bag. No fever or chill. Report burning sensation since last exchange. Not sure when he will see urology.  Oncology History  Prostate cancer metastatic to bone [facility])  08/2022 Surgery   TURP for BPH: Prostatic adenocarcinoma, Gleason score 4+5=9 (grade group5) involves 80% of the resected tissue.  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    08/2022 Initial Diagnosis   Prostate cancer metastatic  to bone [facility])   09/28/2022 Tumor Marker   PSA 33.5   10/30/2022 Imaging   PSMA PET IMPRESSION: 1. There is diffuse radiotracer uptake throughout the prostate gland compatible with residual/recurrent prostate cancer. 2. Bilateral tracer avid pelvic lymph nodes compatible with nodal metastasis. 3. Multifocal tracer avid bone metastases. 4.  Solitary tracer avid pulmonary nodule within the right upper lobe is identified. 5. 5.2 cm infrarenal abdominal aortic aneurysm. Recommend follow-up every 6 months and vascular consultation. This recommendation follows ACR consensus guidelines: White Paper of the ACR Incidental Findings Committee II on Vascular Findings. J Am Coll Radiol 2013; 10:789-794. Aortic aneurysm NOS (ICD10-I71.9). 6.  Aortic Atherosclerosis (ICD10-I70.0).   11/2022 -  Chemotherapy   started Orgovynx and converted to Eligard .   03/02/2023 Tumor Marker   PSA 14.8 testosterone  19.7. alk phos 77   04/09/2023 Genetic Testing   Negative CustomNext-Cancer +RNAinsight Panel.  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  Report date is 04/09/2023.   The CustomNext-Cancer+RNAinsight panel offered by [facility] includes sequencing, rearrangement, and RNA analysis for the following 47 genes:  APC, ATM, AXIN2, BAP1, BARD1, BMPR1A, BRCA1, BRCA2, BRIP1, CDH1, CDK4, CDKN2A, CHEK2, CTNNA1, DICER1, EPCAM, FH, GREM1, HOXB13, KIT, MEN1, MLH1, MSH2, MSH3, MSH6, MUTYH, NF1, NTHL1, PALB2, PDGFRA, PMS2, POLD1, POLE, PTEN, RAD51C, RAD51D, SDHA, SDHB, SDHC, SDHD, SMAD4, SMARCA4, STK11, TP53, TSC1, TSC2, VHL.     04/2023 -  Chemotherapy   Started Xtandi 160 mg.   06/29/2023 - 06/29/2023 Chemotherapy   Patient is on Treatment Plan : PROSTATE Docetaxel  (50) q14d        PHYSICAL EXAMINATION: ECOG PERFORMANCE STATUS: 2 - Symptomatic, <50% confined to bed  Vitals:   12/12/23 0832  BP: (!) 157/83  Pulse: 65  Resp: 17  Temp: 97.6 F (36.4 C)  SpO2: 97%   Filed Weights   12/12/23 0832  Weight: 216 lb 6.4 oz (98.2 kg)   Catheter in place. Yellowish urine.  Relevant data reviewed during this visit included labs.

## 2023-12-12 NOTE — Assessment & Plan Note (Addendum)
 On simvastatin. No toxicities with daro.

## 2023-12-13 ENCOUNTER — Ambulatory Visit: Payer: Self-pay

## 2023-12-13 LAB — TESTOSTERONE: Testosterone: 17 ng/dL — ABNORMAL LOW (ref 264–916)

## 2023-12-13 LAB — PROSTATE-SPECIFIC AG, SERUM (LABCORP): Prostate Specific Ag, Serum: 1.5 ng/mL (ref 0.0–4.0)

## 2023-12-24 ENCOUNTER — Other Ambulatory Visit: Payer: Self-pay

## 2023-12-24 ENCOUNTER — Encounter (HOSPITAL_COMMUNITY): Payer: Self-pay

## 2023-12-24 ENCOUNTER — Emergency Department (HOSPITAL_COMMUNITY)
Admission: EM | Admit: 2023-12-24 | Discharge: 2023-12-24 | Disposition: A | Discharge status: Home / Self Care | Attending: Emergency Medicine | Admitting: Emergency Medicine

## 2023-12-24 DIAGNOSIS — R339 Retention of urine, unspecified: Secondary | ICD-10-CM | POA: Diagnosis present

## 2023-12-24 DIAGNOSIS — Z8546 Personal history of malignant neoplasm of prostate: Secondary | ICD-10-CM | POA: Insufficient documentation

## 2023-12-24 DIAGNOSIS — Y732 Prosthetic and other implants, materials and accessory gastroenterology and urology devices associated with adverse incidents: Secondary | ICD-10-CM | POA: Diagnosis not present

## 2023-12-24 DIAGNOSIS — T8389XA Other specified complication of genitourinary prosthetic devices, implants and grafts, initial encounter: Secondary | ICD-10-CM | POA: Diagnosis not present

## 2023-12-24 DIAGNOSIS — T83091A Other mechanical complication of indwelling urethral catheter, initial encounter: Secondary | ICD-10-CM

## 2023-12-24 LAB — URINALYSIS, ROUTINE W REFLEX MICROSCOPIC
Bilirubin Urine: NEGATIVE
Glucose, UA: NEGATIVE mg/dL
Ketones, ur: NEGATIVE mg/dL
Nitrite: NEGATIVE
Protein, ur: 100 mg/dL — AB
RBC / HPF: >50 RBC/hpf (ref 0–5)
Specific Gravity, Urine: 1.023 (ref 1.005–1.030)
pH: 5 (ref 5.0–8.0)

## 2023-12-24 LAB — CBC
HCT: 40.2 % (ref 39.0–52.0)
Hemoglobin: 13.6 g/dL (ref 13.0–17.0)
MCH: 32.2 pg (ref 26.0–34.0)
MCHC: 33.8 g/dL (ref 30.0–36.0)
MCV: 95.3 fL (ref 80.0–100.0)
Platelets: 202 10*3/uL (ref 150–400)
RBC: 4.22 MIL/uL (ref 4.22–5.81)
RDW: 12.6 % (ref 11.5–15.5)
WBC: 7.9 10*3/uL (ref 4.0–10.5)
nRBC: 0 % (ref 0.0–0.2)

## 2023-12-24 LAB — BASIC METABOLIC PANEL WITH GFR
Anion gap: 11 (ref 5–15)
BUN: 21 mg/dL (ref 8–23)
CO2: 20 mmol/L — ABNORMAL LOW (ref 22–32)
Calcium: 9.2 mg/dL (ref 8.9–10.3)
Chloride: 104 mmol/L (ref 98–111)
Creatinine, Ser: 0.78 mg/dL (ref 0.61–1.24)
GFR, Estimated: >60 mL/min (ref >60)
Glucose, Bld: 170 mg/dL — ABNORMAL HIGH (ref 70–99)
Potassium: 3.7 mmol/L (ref 3.5–5.1)
Sodium: 135 mmol/L (ref 135–145)

## 2023-12-24 NOTE — Discharge Instructions (Signed)
 It was a pleasure taking part in your care.  As discussed, please follow-up with urology on Wednesday.  Please leave Foley catheter in place until then.  Please continue taking Keflex  as prescribed.  Return to the ED with any new or worsening symptoms.

## 2023-12-24 NOTE — ED Triage Notes (Signed)
 Pt arrived from home via POV c/o foley catheter obstruction. 10/10 pain lower abdomen. Pt states that no urine has drained into drainage bag since yesterday.

## 2023-12-24 NOTE — ED Provider Notes (Signed)
 [place] EMERGENCY DEPARTMENT AT [facility] Provider Note   CSN: [id number] Arrival date & time: 12/24/23  4098     Patient presents with: Urinary Retention   David [name]. is a 78 y.o. male with primary medical history of prostate cancer with indwelling Foley catheter chronic.  Patient presents to ED for evaluation of clogged Foley catheter.  Reports that Saturday evening he noticed that his Foley catheter was not draining appropriately.  He reports that this is worsened since onset on Saturday night.  Presents to ED complaining of Foley catheter being clogged, abdominal pain.  Denies fevers, nausea or vomiting at home.  Reports he recently finished radiation therapy.  States that he is set to follow-up with urology on Wednesday of this week.  Denies any other concerns.  Patient reports he is currently on Keflex  for UTI and has 2 days left.  HPI     Prior to Admission medications   Medication Sig Start Date End Date Taking? Authorizing Provider  acetaminophen  (TYLENOL ) 325 MG tablet Take 650 mg by mouth every 6 (six) hours as needed (pain and sleep aid).    [provider]  cetirizine  (ZYRTEC ) 10 MG tablet Take 1 tablet (10 mg total) by mouth daily. 12/28/22   Fargo, Amy E, NP  Cholecalciferol (VITAMIN D3) 25 MCG (1000 UT) CAPS Take 1 capsule by mouth daily.    [provider]  ciprofloxacin  (CIPRO ) 500 MG tablet Take 1 tablet (500 mg total) by mouth every 12 (twelve) hours. 10/26/23   Raspet, Erin K, PA-C  darolutamide  (NUBEQA ) 300 MG tablet Take 2 tablets (600 mg total) by mouth 2 (two) times daily with a meal. 05/28/23   Lowanda Ruddy, MD  finasteride  (PROSCAR ) 5 MG tablet TAKE 1 TABLET(5 MG) BY MOUTH DAILY 11/19/23   Eubanks, Jessica K, NP  losartan  (COZAAR ) 100 MG tablet TAKE 1 TABLET BY MOUTH DAILY 05/28/23   Lowanda Ruddy, MD  silodosin (RAPAFLO) 8 MG CAPS capsule Take 8 mg by mouth daily with breakfast.    [provider]   simvastatin  (ZOCOR ) 20 MG tablet TAKE 1 TABLET(20 MG) BY MOUTH DAILY AT 6 PM 11/09/23   Fargo, Amy E, NP  traZODone  (DESYREL ) 150 MG tablet Take 1 tablet (150 mg total) by mouth at bedtime. 11/01/23   Fargo, Amy E, NP  triamcinolone  cream (KENALOG ) 0.1 % Apply 1 Application topically 2 (two) times daily. 05/03/23   Arnetha Bhat, NP    Allergies: Oxycontin [oxycodone hcl], Penicillins, Celebrex [celecoxib], Erythromycin, and Tetracyclines & related    Review of Systems  Genitourinary:  Positive for difficulty urinating.  All other systems reviewed and are negative.   Updated Vital Signs BP (!) 168/96 (BP Location: Right Arm)   Pulse 75   Temp 98.9 F (37.2 C)   Resp 18   Ht 5' 8 (1.727 m)   Wt 97.1 kg   SpO2 98%   BMI 32.54 kg/m   Physical Exam Vitals and nursing note reviewed.  Constitutional:      General: He is not in acute distress.    Appearance: He is well-developed.  HENT:     Head: Normocephalic and atraumatic.   Eyes:     Conjunctiva/sclera: Conjunctivae normal.    Cardiovascular:     Rate and Rhythm: Normal rate and regular rhythm.     Heart sounds: No murmur heard. Pulmonary:     Effort: Pulmonary effort is normal. No respiratory distress.  Breath sounds: Normal breath sounds.  Abdominal:     Palpations: Abdomen is soft.     Tenderness: There is abdominal tenderness.     Comments: TTP of lower abdominal fields  Genitourinary:    Comments: Foley catheter in place  Musculoskeletal:        General: No swelling.     Cervical back: Neck supple.   Skin:    General: Skin is warm and dry.     Capillary Refill: Capillary refill takes less than 2 seconds.   Neurological:     Mental Status: He is alert and oriented to person, place, and time. Mental status is at baseline.   Psychiatric:        Mood and Affect: Mood normal.     (all labs ordered are listed, but only abnormal results are displayed) Labs Reviewed  BASIC METABOLIC PANEL WITH GFR -  Abnormal; Notable for the following components:      Result Value   CO2 20 (*)    Glucose, Bld 170 (*)    All other components within normal limits  URINALYSIS, ROUTINE W REFLEX MICROSCOPIC - Abnormal; Notable for the following components:   Color, Urine AMBER (*)    APPearance CLOUDY (*)    Hgb urine dipstick LARGE (*)    Protein, ur 100 (*)    Leukocytes,Ua TRACE (*)    Bacteria, UA FEW (*)    All other components within normal limits  URINE CULTURE  CBC    EKG: None  Radiology: No results found.   Procedures   Medications Ordered in the ED - No data to display  Medical Decision Making Amount and/or Complexity of Data Reviewed Labs: ordered.   78 year old male presents for evaluation.  Please see HPI for further details.  On examination the patient is afebrile and nontachycardic.  His lung sounds are clear bilaterally, he is hypoxic.  Abdomen has tenderness throughout lower abdominal fields, no rebound or guarding.  Neurological examination at baseline.  Patient Foley catheter clogged.  Patient Foley catheter was exchanged at this time.  Bladder was scanned initially which showed 0 mL however once patient Foley catheter was replaced, urine began flowing into the bag.  Patient reports immediate relief of the lower abdominal pain at this time with new catheter.  His urine will be cultured.  Will check basic labs to ensure no elevation of creatinine.  He reports he still has 2 days left of Keflex .  CBC with a leukocytosis or anemia.  Metabolic panel with stable creatinine 0.78.  Urinalysis shows large hemoglobin, protein, trace leukocytes and bacteria.  Will culture patient urine.  Patient still on Keflex  at this time.  At this time, patient catheter is functioning appropriately.  Patient will be discharged home.  Reports he has urology follow-up appointment on Wednesday.  Advised to keep appointment.  Advised to return to the ED with any new or worsening symptoms and he  voiced understanding.  Stable to discharge.    Final diagnoses:  Obstruction of Foley catheter, initial encounter [facility])    ED Discharge Orders     None          Adel Aden, PA-C 12/24/23 1610    Ballard Bongo, MD 12/26/23 0003

## 2023-12-25 LAB — URINE CULTURE: Culture: NO GROWTH

## 2023-12-27 NOTE — Progress Notes (Signed)
 Patient received Eligard  45mg  on 12/26/2023 at [facility].   Patient is scheduled to receive Xgeva  on 7/21.

## 2024-01-04 ENCOUNTER — Other Ambulatory Visit: Payer: Self-pay

## 2024-01-04 ENCOUNTER — Encounter (HOSPITAL_COMMUNITY): Payer: Self-pay

## 2024-01-04 ENCOUNTER — Emergency Department (HOSPITAL_COMMUNITY): Admission: EM | Admit: 2024-01-04 | Discharge: 2024-01-04 | Discharge status: Against Medical Advice

## 2024-01-04 ENCOUNTER — Ambulatory Visit (HOSPITAL_COMMUNITY)
Admission: EM | Admit: 2024-01-04 | Discharge: 2024-01-04 | Disposition: A | Discharge status: Home / Self Care | Attending: Nurse Practitioner | Admitting: Nurse Practitioner

## 2024-01-04 ENCOUNTER — Encounter (HOSPITAL_COMMUNITY): Payer: Self-pay | Admitting: *Deleted

## 2024-01-04 DIAGNOSIS — Z5321 Procedure and treatment not carried out due to patient leaving prior to being seen by health care provider: Secondary | ICD-10-CM | POA: Insufficient documentation

## 2024-01-04 DIAGNOSIS — T839XXA Unspecified complication of genitourinary prosthetic device, implant and graft, initial encounter: Secondary | ICD-10-CM | POA: Diagnosis not present

## 2024-01-04 DIAGNOSIS — R339 Retention of urine, unspecified: Secondary | ICD-10-CM | POA: Diagnosis not present

## 2024-01-04 DIAGNOSIS — R1031 Right lower quadrant pain: Secondary | ICD-10-CM | POA: Diagnosis present

## 2024-01-04 LAB — CBC WITH DIFFERENTIAL/PLATELET
Abs Immature Granulocytes: 0.04 10*3/uL (ref 0.00–0.07)
Basophils Absolute: 0.1 10*3/uL (ref 0.0–0.1)
Basophils Relative: 1 %
Eosinophils Absolute: 0 10*3/uL (ref 0.0–0.5)
Eosinophils Relative: 0 %
HCT: 42.7 % (ref 39.0–52.0)
Hemoglobin: 14.2 g/dL (ref 13.0–17.0)
Immature Granulocytes: 1 %
Lymphocytes Relative: 12 %
Lymphs Abs: 0.7 10*3/uL (ref 0.7–4.0)
MCH: 33.3 pg (ref 26.0–34.0)
MCHC: 33.3 g/dL (ref 30.0–36.0)
MCV: 100.2 fL — ABNORMAL HIGH (ref 80.0–100.0)
Monocytes Absolute: 0.4 10*3/uL (ref 0.1–1.0)
Monocytes Relative: 7 %
Neutro Abs: 4.3 10*3/uL (ref 1.7–7.7)
Neutrophils Relative %: 79 %
Platelets: 188 10*3/uL (ref 150–400)
RBC: 4.26 MIL/uL (ref 4.22–5.81)
RDW: 12.8 % (ref 11.5–15.5)
WBC: 5.5 10*3/uL (ref 4.0–10.5)
nRBC: 0 % (ref 0.0–0.2)

## 2024-01-04 LAB — COMPREHENSIVE METABOLIC PANEL WITH GFR
ALT: 15 U/L (ref 0–44)
AST: 21 U/L (ref 15–41)
Albumin: 4.3 g/dL (ref 3.5–5.0)
Alkaline Phosphatase: 52 U/L (ref 38–126)
Anion gap: 12 (ref 5–15)
BUN: 14 mg/dL (ref 8–23)
CO2: 23 mmol/L (ref 22–32)
Calcium: 9.7 mg/dL (ref 8.9–10.3)
Chloride: 103 mmol/L (ref 98–111)
Creatinine, Ser: 0.78 mg/dL (ref 0.61–1.24)
GFR, Estimated: >60 mL/min (ref >60)
Glucose, Bld: 134 mg/dL — ABNORMAL HIGH (ref 70–99)
Potassium: 4 mmol/L (ref 3.5–5.1)
Sodium: 138 mmol/L (ref 135–145)
Total Bilirubin: 1.1 mg/dL (ref 0.0–1.2)
Total Protein: 7.7 g/dL (ref 6.5–8.1)

## 2024-01-04 LAB — LIPASE, BLOOD: Lipase: 27 U/L (ref 11–51)

## 2024-01-04 MED ORDER — KETOROLAC TROMETHAMINE 60 MG/2ML IM SOLN
INTRAMUSCULAR | Status: AC
  Filled 2024-01-04: qty 2

## 2024-01-04 MED ORDER — ACETAMINOPHEN 500 MG PO TABS
1000.0000 mg | ORAL_TABLET | Freq: Once | ORAL | Status: AC
  Administered 2024-01-04: 1000 mg via ORAL
  Filled 2024-01-04: qty 2

## 2024-01-04 MED ORDER — KETOROLAC TROMETHAMINE 60 MG/2ML IM SOLN
60.0000 mg | Freq: Once | INTRAMUSCULAR | Status: AC
  Administered 2024-01-04: 60 mg via INTRAMUSCULAR

## 2024-01-04 NOTE — ED Provider Triage Note (Signed)
 Emergency Medicine Provider Triage Evaluation Note  David [name]. , a 78 y.o. male  was evaluated in triage.  Pt complains of pain and right flank and groin.  Has indwelling Foley catheter.  Went to urgent care earlier and received Toradol which did improve his pain some.  Started to have some drainage from his catheter but is still having discomfort.  Denies any blood in his catheter.  Review of Systems  Positive: Flank pain Negative: Fever  Physical Exam  BP (!) 160/94   Pulse (!) 50   Temp 98.2 F (36.8 C)   Resp (!) 23   Ht 5' 8 (1.727 m)   Wt 98.4 kg   SpO2 98%   BMI 32.99 kg/m  Gen:   Awake, no distress   Resp:  Normal effort  MSK:   Moves extremities without difficulty  Other:  Suprapubic tenderness noted, normal-appearing male genitalia with no crepitus, no testicular pain or swelling noted  Medical Decision Making  Medically screening exam initiated at 11:52 AM.  Appropriate orders placed.  David [name]. was informed that the remainder of the evaluation will be completed by another provider, this initial triage assessment does not replace that evaluation, and the importance of remaining in the ED until their evaluation is complete.  Initially patient was bladder scanned x 2 with no residual urine noted.  Labs are ordered as well as a CT scan to evaluate for ureterolithiasis or other intra-abdominal pathology.  Patient given Tylenol  for pain in addition to the Toradol he is already received.   David Prentice JONELLE, MD 01/04/24 1154

## 2024-01-04 NOTE — ED Notes (Signed)
 Bringing pt back to room and pt stated that he was leaving to go to his md appt bc they can see him right away. Informed pt that they may send him back here and he said he will take that chance. Removed piv

## 2024-01-04 NOTE — Discharge Instructions (Signed)
 You were seen today for urinary retention and groin pain. You have a chronic indwelling urinary catheter and reported minimal urine output since yesterday, along with discomfort in the groin area. Your last catheter change was on 12/24/2023 due to blockage and bleeding. Today's symptoms suggest that your catheter may be blocked or not functioning properly.  Urgent care is not equipped to replace urinary catheters or perform bladder scans. You were given an injection of Toradol to help reduce pain and inflammation. You have been advised to go to the Emergency Department for further evaluation, including a bladder scan and possible catheter replacement. Alternatively, you may wait for your scheduled urology appointment at 2:15 PM today if you are stable and able to tolerate the symptoms.

## 2024-01-04 NOTE — ED Provider Notes (Signed)
 [facility]    CSN: [id number] Arrival date & time: 01/04/24  9040      History   Chief Complaint Chief Complaint  Patient presents with   Urinary Retention    HPI David Frankl. is a 78 y.o. male.   Discussed the use of AI scribe software for clinical note transcription with the patient, who gave verbal consent to proceed.   David Hanawalt. is a 78 y.o. male with a history of prostate cancer and chronic urinary retention requiring an indwelling catheter. He presents to Urgent Care with urinary retention and genital pain. The patient reports that he has not been able to urinate for approximately one day. He last emptied a good amount of urine from his catheter around noon yesterday. Since then, he has only produced about 75cc of urine. The patient is experiencing pain in his groin and genitals, which he rates as a 7 out of 10 in severity. He states that he can't sit or stand for very long due to the discomfort. He has been drinking a lot of water  and sweating excessively due to hot weather. The patient was recently seen in the Emergency Room on June 16th for a similar issue, where his catheter was found to be clogged and not draining. At that time, the catheter was removed and replaced, revealing a large amount of blood. The patient reports that he was unable to urinate for two days prior to that ER visit. The patient last saw his urologist on June 17th, where they only changed his catheter bag because it was too small. He went to his urologist office this morning but they were unable to see him urgently but made an appointment for later today at 2:15 PM. Patient reports that he doesn't want to wait another 3.5 hours to get relief from his pain which is why he came to the urgent care.   The following portions of the patient's history were reviewed and updated as appropriate: allergies, current medications, past family history, past medical history, past social history,  past surgical history, and problem list.      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    Elevated PSA    Flatulence, eructation, and gas pain    Headache(784.0)    Hemorrhoids, external    Herpes zoster with unspecified complication    Hyperpotassemia    Insomnia, unspecified    Lumbago    Lumbago    Myalgia and myositis, unspecified    Nocturia    Obesity, unspecified    Osteoarthrosis, unspecified whether generalized or localized, unspecified site    Other abnormal blood chemistry    Other headache syndromes(339.89)    Pre-diabetes    Pure hyperglyceridemia    Rash and other nonspecific skin eruption    Rotator cuff (capsule) sprain    Routine general medical examination at a health care facility    Tobacco use disorder    Unspecified essential hypertension    Unspecified hypertrophic and atrophic condition of skin    Unspecified sleep apnea     Patient Active Problem List   Diagnosis Date Noted  Infestation by bed bug 05/28/2023   Genetic testing 04/12/2023   Abdominal aortic aneurysm (AAA) (HCC) 03/29/2023   Prostate cancer metastatic to bone (HCC) 03/27/2023   At risk for side effect of medication 03/27/2023   BPH (benign prostatic hyperplasia) 08/22/2022   Pain in left shoulder 11/03/2020   History of melanoma 04/05/2020   Left sided sciatica 12/01/2019   Postherpetic neuralgia 11/28/2018   Primary osteoarthritis of left knee 11/28/2018   Need for influenza vaccination 03/28/2018   S/P cataract extraction and insertion of intraocular lens, right 01/08/2017   Environmental allergies 11/08/2015   Seasonal allergies 11/08/2015    Encounter for smoking cessation counseling 11/08/2015   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   Hypertriglyceridemia 09/11/2013   Depression 09/11/2013   Insomnia 09/11/2013   Generalized anxiety disorder 09/11/2013   Dry mouth 09/11/2013   Hyperglycemia 09/11/2013   Mixed hyperlipidemia 10/28/2012   Essential hypertension 10/28/2012   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   Obesity, unspecified 10/28/2012    Past Surgical History:  Procedure Laterality Date   CYSTOSCOPY N/A 08/22/2022   Procedure: CYSTOSCOPY;  Surgeon: Rosalind Zachary NOVAK, MD;  Location: [facility];  Service: Urology;  Laterality: N/A;   DENTAL SURGERY  05/10/2021   Per patient   HEMORRHOID SURGERY     PROSTATE BIOPSY     TRANSURETHRAL RESECTION OF PROSTATE N/A 08/22/2022   Procedure: TRANSURETHRAL RESECTION OF THE PROSTATE (TURP);  Surgeon: Rosalind Zachary NOVAK, MD;  Location: [facility];  Service: Urology;  Laterality: N/A;   tumorectomy Left    Removed tumor from his chest.   VARICOSE VEIN SURGERY         Home Medications    Prior to Admission medications   Medication Sig Start Date End Date Taking? Authorizing Provider  acetaminophen  (TYLENOL ) 325 MG tablet Take 650 mg by mouth every 6 (six) hours as needed (pain and sleep aid).   Yes [provider]  cetirizine  (ZYRTEC ) 10 MG tablet Take 1 tablet (10 mg total) by mouth daily. 12/28/22  Yes Fargo, Amy E, NP  Cholecalciferol (VITAMIN D3) 25 MCG (1000 UT) CAPS Take 1 capsule by mouth daily.   Yes [provider]  ciprofloxacin  (CIPRO ) 500 MG tablet Take 1 tablet (500 mg total) by mouth every 12 (twelve) hours. 10/26/23  Yes Raspet, Erin K, PA-C  darolutamide  (NUBEQA ) 300 MG tablet Take 2 tablets (600 mg total) by mouth 2 (two) times daily with a meal. 05/28/23  Yes Tina Pauletta BROCKS, MD  finasteride  (PROSCAR ) 5 MG tablet TAKE 1 TABLET(5 MG) BY MOUTH DAILY 11/19/23  Yes Eubanks, Jessica K, NP   losartan  (COZAAR ) 100 MG tablet TAKE 1 TABLET BY MOUTH DAILY 05/28/23  Yes Tina Pauletta BROCKS, MD  silodosin (RAPAFLO) 8 MG CAPS capsule Take 8 mg by mouth daily with breakfast.   Yes [provider]  simvastatin  (ZOCOR ) 20 MG tablet TAKE 1 TABLET(20 MG) BY MOUTH DAILY AT 6 PM 11/09/23  Yes Fargo, Amy E, NP  traZODone  (DESYREL ) 150 MG tablet Take 1 tablet (150 mg total) by mouth at bedtime. 11/01/23  Yes Fargo, Amy E, NP  triamcinolone  cream (KENALOG ) 0.1 % Apply 1 Application topically 2 (two) times daily. 05/03/23  Yes Fargo, Amy E, NP    Family History Family History  Problem Relation Age of Onset   Diabetes Mother    Heart disease Mother    Stroke Mother    Stroke Father    Leukemia Maternal Aunt  dx >50   Breast cancer Paternal Aunt        dx >50   Stomach cancer Cousin        d. 83; pat male cousin    Social History Social History   Tobacco Use   Smoking status: Former    Current packs/day: 0.00    Average packs/day: 1.5 packs/day for 55.0 years (82.5 ttl pk-yrs)    Types: Cigarettes    Start date: 07/29/1960    Quit date: 07/30/2015    Years since quitting: 8.4   Smokeless tobacco: Never   Tobacco comments:    pt thinks increased effexor  dose helped him to quit  Vaping Use   Vaping status: Never Used  Substance Use Topics   Alcohol use: No    Alcohol/week: 0.0 standard drinks of alcohol   Drug use: No     Allergies   Oxycontin [oxycodone hcl], Penicillins, Celebrex [celecoxib], Erythromycin, and Tetracyclines & related   Review of Systems Review of Systems  Constitutional:  Positive for diaphoresis. Negative for chills and fever.  Gastrointestinal:  Negative for abdominal pain, nausea and vomiting.  Genitourinary:  Negative for decreased urine volume, difficulty urinating and hematuria.       Pain in suprapubic and groin area   Musculoskeletal:  Negative for back pain.  All other systems reviewed and are negative.    Physical Exam Triage  Vital Signs ED Triage Vitals  Encounter Vitals Group     BP 01/04/24 1021 (!) 159/94     Girls Systolic BP Percentile --      Girls Diastolic BP Percentile --      Boys Systolic BP Percentile --      Boys Diastolic BP Percentile --      Pulse Rate 01/04/24 1021 69     Resp 01/04/24 1021 18     Temp 01/04/24 1021 98 F (36.7 C)     Temp Source 01/04/24 1021 Oral     SpO2 01/04/24 1021 93 %     Weight --      Height --      Head Circumference --      Peak Flow --      Pain Score 01/04/24 1020 7     Pain Loc --      Pain Education --      Exclude from Growth Chart --    No data found.  Updated Vital Signs BP (!) 159/94 (BP Location: Right Arm)   Pulse 69   Temp 98 F (36.7 C) (Oral)   Resp 18   SpO2 93%   Visual Acuity Right Eye Distance:   Left Eye Distance:   Bilateral Distance:    Right Eye Near:   Left Eye Near:    Bilateral Near:     Physical Exam Vitals reviewed. Exam conducted with a chaperone present [facility], CMA).  Constitutional:      General: He is awake. He is not in acute distress.    Appearance: Normal appearance. He is well-developed. He is not ill-appearing, toxic-appearing or diaphoretic.     Comments: Uncomfortable   HENT:     Head: Normocephalic.     Mouth/Throat:     Mouth: Mucous membranes are moist.   Eyes:     Conjunctiva/sclera: Conjunctivae normal.    Cardiovascular:     Rate and Rhythm: Normal rate and regular rhythm.     Heart sounds: Normal heart sounds.  Pulmonary:     Effort: Pulmonary effort is  normal.     Breath sounds: Normal breath sounds.  Abdominal:     General: There is no distension.     Palpations: Abdomen is soft.     Tenderness: There is no abdominal tenderness. There is no right CVA tenderness or left CVA tenderness.  Genitourinary:    Comments: Indwelling foley catheter in situ. No urine noted within the tube. No swelling or redness noted to the glans penis. There is about 75 mL noted of clear, yellow urine in  drainage collection bag.   Musculoskeletal:        General: Normal range of motion.   Skin:    General: Skin is warm and dry.   Neurological:     General: No focal deficit present.     Mental Status: He is alert and oriented to person, place, and time.   Psychiatric:        Behavior: Behavior is cooperative.      UC Treatments / Results  Labs (all labs ordered are listed, but only abnormal results are displayed) Labs Reviewed  POCT URINALYSIS DIP (MANUAL ENTRY)    EKG   Radiology No results found.  Procedures Procedures (including critical care time)  Medications Ordered in UC Medications  ketorolac (TORADOL) injection 60 mg (has no administration in time range)    Initial Impression / Assessment and Plan / UC Course  I have reviewed the triage vital signs and the nursing notes.  Pertinent labs & imaging results that were available during my care of the patient were reviewed by me and considered in my medical decision making (see chart for details).     78 year old male with a history of prostate cancer and chronic indwelling urinary catheter presents with acute urinary retention, reporting only 75cc of urine output since noon yesterday and associated groin pain rated 7/10. He last had his catheter changed in the ER on 12/24/2023 for clogging and bleeding. A follow-up urology visit on 12/25/2023 resulted only in a change to a larger drainage bag, with no catheter replacement. Today's presentation is concerning for catheter malfunction or blockage. Patient was informed that urgent care is not equipped to replace catheters or perform bladder scans. He was given a Toradol injection for symptomatic relief and advised to proceed to the Emergency Department for further evaluation, bladder scan, and possible catheter replacement. Patient expressed frustration about potential ED wait times but was also advised that he may wait for his scheduled urology appointment at 2:15 PM  today. If unable to urinate, experiences worsening pain, fever, chills, or signs of systemic infection, he should seek immediate emergency care.  Today's evaluation has revealed no signs of a dangerous process. Discussed diagnosis with patient and/or guardian. Patient and/or guardian aware of their diagnosis, possible red flag symptoms to watch out for and need for close follow up. Patient and/or guardian understands verbal and written discharge instructions. Patient and/or guardian comfortable with plan and disposition.  Patient and/or guardian has a clear mental status at this time, good insight into illness (after discussion and teaching) and has clear judgment to make decisions regarding their care  Documentation was completed with the aid of voice recognition software. Transcription may contain typographical errors. Final Clinical Impressions(s) / UC Diagnoses   Final diagnoses:  Urinary retention  Complication of Foley catheter, initial encounter [facility])     Discharge Instructions      You were seen today for urinary retention and groin pain. You have a chronic indwelling urinary catheter and reported minimal  urine output since yesterday, along with discomfort in the groin area. Your last catheter change was on 12/24/2023 due to blockage and bleeding. Today's symptoms suggest that your catheter may be blocked or not functioning properly.  Urgent care is not equipped to replace urinary catheters or perform bladder scans. You were given an injection of Toradol to help reduce pain and inflammation. You have been advised to go to the Emergency Department for further evaluation, including a bladder scan and possible catheter replacement. Alternatively, you may wait for your scheduled urology appointment at 2:15 PM today if you are stable and able to tolerate the symptoms.      ED Prescriptions   None    PDMP not reviewed this encounter.   Iola Lukes, [place] 01/04/24 1044

## 2024-01-04 NOTE — ED Triage Notes (Signed)
 Pt states he's having groin/ testicle pain since yesterday. Pt had catheter placed the 16th for prostate cancer. Denies abd pain.

## 2024-01-04 NOTE — ED Triage Notes (Signed)
 Pt states that he is unable to urinate and he uses a catheter. Pt states he has been unable to urinate since yesterday, but he is in extreme pain. Pt states he isn't taking anything for the pain.

## 2024-01-10 ENCOUNTER — Telehealth

## 2024-01-10 NOTE — Transitions of Care (Post Inpatient/ED Visit) (Signed)
   01/10/2024  Name: David [name]. MRN: [id number] DOB: [birth year]  Today's TOC FU Call Status: Today's TOC FU Call Status:: Successful TOC FU Call Completed TOC FU Call Complete Date: 01/10/24 Patient's Name and Date of Birth confirmed.  Transition Care Management Follow-up Telephone Call Discharge Facility: [facility]) Type of Discharge: Emergency Department Reason for ED Visit: Other: How have you been since you were released from the hospital?: Same Any questions or concerns?: Yes Patient Questions/Concerns:: Patient will save questions for appointment Patient Questions/Concerns Addressed: Other:  Items Reviewed: Did you receive and understand the discharge instructions provided?: Yes Medications obtained,verified, and reconciled?: Yes (Medications Reviewed) Any new allergies since your discharge?: No Dietary orders reviewed?: No Do you have support at home?: Yes People in Home [RPT]: spouse Name of Support/Comfort Primary Source: Mrs.Mccasland  Medications Reviewed Today: Medications Reviewed Today     Reviewed by Suleiman Finigan E, CMA (Certified Medical Assistant) on 01/10/24 at 1012  Med List Status: <None>   Medication Order Taking? Sig Documenting Provider Last Dose Status Informant  acetaminophen  (TYLENOL ) 325 MG tablet [id number] Yes Take 650 mg by mouth every 6 (six) hours as needed (pain and sleep aid). [provider]  Active Self  cetirizine  (ZYRTEC ) 10 MG tablet [id number] Yes Take 1 tablet (10 mg total) by mouth daily. Fargo, Amy E, NP  Active Self  Cholecalciferol (VITAMIN D3) 25 MCG (1000 UT) CAPS [id number] Yes Take 1 capsule by mouth daily. [provider]  Active   ciprofloxacin  (CIPRO ) 500 MG tablet [id number] Yes Take 1 tablet (500 mg total) by mouth every 12 (twelve) hours. Raspet, Erin K, PA-C  Active   darolutamide  (NUBEQA ) 300 MG tablet [id number] Yes Take 2 tablets (600 mg total) by mouth 2 (two) times daily with a meal. Tina Pauletta BROCKS, MD  Active   finasteride  (PROSCAR ) 5 MG tablet [id number] Yes TAKE 1 TABLET(5 MG) BY MOUTH DAILY Eubanks, Jessica K, NP  Active   losartan  (COZAAR ) 100 MG tablet [id number] Yes TAKE 1 TABLET BY MOUTH DAILY Tina Pauletta BROCKS, MD  Active   silodosin (RAPAFLO) 8 MG CAPS capsule [id number] Yes Take 8 mg by mouth daily with breakfast. [provider]  Active Self  simvastatin  (ZOCOR ) 20 MG tablet [id number] Yes TAKE 1 TABLET(20 MG) BY MOUTH DAILY AT 6 PM Fargo, Amy E, NP  Active   traZODone  (DESYREL ) 150 MG tablet [id number] Yes Take 1 tablet (150 mg total) by mouth at bedtime. Fargo, Amy E, NP  Active   triamcinolone  cream (KENALOG ) 0.1 % [id number] Yes Apply 1 Application topically 2 (two) times daily. Gil Greig BRAVO, NP  Active             Home Care and Equipment/Supplies: Were Home Health Services Ordered?: No Any new equipment or medical supplies ordered?: NA  Functional Questionnaire: Do you need assistance with bathing/showering or dressing?: No Do you need assistance with meal preparation?: No Do you need assistance with eating?: No Do you have difficulty maintaining continence: No Do you need assistance with getting out of bed/getting out of a chair/moving?: No Do you have difficulty managing or taking your medications?: No  Follow up appointments reviewed: PCP Follow-up appointment confirmed?: Yes Date of PCP follow-up appointment?: 01/16/24 Follow-up Provider: Barnie Seip, NP [facility] Follow-up appointment confirmed?: NA Do you need transportation to your follow-up appointment?: No Do you understand care options if your condition(s) worsen?: Yes-patient verbalized understanding    SIGNATURE: Jem Castro.D/RMA

## 2024-01-14 ENCOUNTER — Other Ambulatory Visit: Payer: Self-pay

## 2024-01-14 DIAGNOSIS — I7143 Infrarenal abdominal aortic aneurysm, without rupture: Secondary | ICD-10-CM

## 2024-01-16 ENCOUNTER — Encounter: Payer: Self-pay | Admitting: Adult Health

## 2024-01-16 ENCOUNTER — Other Ambulatory Visit: Payer: Self-pay | Admitting: Adult Health

## 2024-01-16 ENCOUNTER — Ambulatory Visit (INDEPENDENT_AMBULATORY_CARE_PROVIDER_SITE_OTHER): Admitting: Adult Health

## 2024-01-16 VITALS — BP 138/82 | HR 60 | Temp 97.6°F | Resp 20 | Ht 68.0 in | Wt 216.8 lb

## 2024-01-16 DIAGNOSIS — N401 Enlarged prostate with lower urinary tract symptoms: Secondary | ICD-10-CM | POA: Diagnosis not present

## 2024-01-16 DIAGNOSIS — R338 Other retention of urine: Secondary | ICD-10-CM

## 2024-01-16 DIAGNOSIS — F4321 Adjustment disorder with depressed mood: Secondary | ICD-10-CM

## 2024-01-16 MED ORDER — TRAZODONE HCL 150 MG PO TABS
150.0000 mg | ORAL_TABLET | Freq: Every day | ORAL | 2 refills | Status: DC
Start: 2024-01-16 — End: 2024-05-01

## 2024-01-16 MED ORDER — SILODOSIN 8 MG PO CAPS: 8.0000 mg | ORAL_CAPSULE | Freq: Every day | ORAL | 3 refills | Status: DC

## 2024-01-16 NOTE — Patient Instructions (Signed)
 Follow up with urology for routine catheter changes.

## 2024-01-16 NOTE — Progress Notes (Signed)
 Location:  [facility]   Place of Service:   [facility] clinic    CODE STATUS:   Allergies  Allergen Reactions   Oxycontin [Oxycodone Hcl] Hives and Shortness Of Breath   Penicillins Hives and Shortness Of Breath   Celebrex [Celecoxib]    Erythromycin    Tetracyclines & Related     Chief Complaint  Patient presents with   Transitions Of Care    ER follow up    HPI:  He has a long term catheter; does get infections at times. The catheter cloggs and closes up. He has had the foley for over one year. He went to the ED due to lack of urine output. At urgent care could not change catheter. He went to the ED for further workup. The ED did not change the catheter; he did go to urology where the catheter was changed.   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    Elevated PSA    Flatulence, eructation, and gas pain    Headache(784.0)    Hemorrhoids, external    Herpes zoster with unspecified complication    Hyperpotassemia    Insomnia, unspecified    Lumbago    Lumbago    Myalgia and myositis, unspecified    Nocturia    Obesity, unspecified    Osteoarthrosis, unspecified whether generalized or localized, unspecified site    Other abnormal blood chemistry    Other headache syndromes(339.89)    Pre-diabetes    Pure hyperglyceridemia    Rash and other nonspecific skin eruption    Rotator cuff (capsule) sprain    Routine general medical examination at a health care facility    Tobacco use disorder    Unspecified essential hypertension    Unspecified hypertrophic and atrophic condition of skin    Unspecified sleep apnea     Past Surgical  History:  Procedure Laterality Date   CYSTOSCOPY N/A 08/22/2022   Procedure: CYSTOSCOPY;  Surgeon: Rosalind Zachary NOVAK, MD;  Location: [facility];  Service: Urology;  Laterality: N/A;   DENTAL SURGERY  05/10/2021   Per patient   HEMORRHOID SURGERY     PROSTATE BIOPSY     TRANSURETHRAL RESECTION OF PROSTATE N/A 08/22/2022   Procedure: TRANSURETHRAL RESECTION OF THE PROSTATE (TURP);  Surgeon: Rosalind Zachary NOVAK, MD;  Location: [facility];  Service: Urology;  Laterality: N/A;   tumorectomy Left    Removed tumor from his chest.   VARICOSE VEIN SURGERY      Social History   Socioeconomic History   Marital status: Married    Spouse name: Not on file   Number of children: Not on file   Years of education: Not on file   Highest education level: Not on file  Occupational History   Not on file  Tobacco Use   Smoking status: Former    Current packs/day: 0.00    Average packs/day: 1.5 packs/day for 55.0 years (82.5 ttl pk-yrs)    Types: Cigarettes    Start date: 07/29/1960    Quit date: 07/30/2015    Years since quitting: 8.4   Smokeless tobacco: Never  Tobacco comments:    pt thinks increased effexor  dose helped him to quit  Vaping Use   Vaping status: Never Used  Substance and Sexual Activity   Alcohol use: No    Alcohol/week: 0.0 standard drinks of alcohol   Drug use: No   Sexual activity: Not Currently  Other Topics Concern   Not on file  Social History Narrative   Married   Stopped smoking 07/30/15   Alcohol none   Exercise none   [employer] [phone] 1987    Social Drivers of Health   Financial Resource Strain: Medium Risk (11/08/2023)   Overall Financial Resource Strain (CARDIA)    Difficulty of Paying Living Expenses: Somewhat hard  Food Insecurity: Food Insecurity Present (11/08/2023)   Hunger Vital Sign    Worried About Running Out of Food in the Last Year: Sometimes true    Ran Out of Food in the Last Year: Sometimes true  Transportation  Needs: No Transportation Needs (11/08/2023)   PRAPARE - Administrator, Civil Service (Medical): No    Lack of Transportation (Non-Medical): No  Physical Activity: Inactive (11/08/2023)   Exercise Vital Sign    Days of Exercise per Week: 0 days    Minutes of Exercise per Session: 0 min  Stress: No Stress Concern Present (11/08/2023)   [employer] of Occupational Health - Occupational Stress Questionnaire    Feeling of Stress : Only a little  Social Connections: Socially Isolated (11/08/2023)   Social Connection and Isolation Panel    Frequency of Communication with Friends and Family: Once a week    Frequency of Social Gatherings with Friends and Family: Once a week    Attends Religious Services: Never    Database administrator or Organizations: No    Attends Banker Meetings: Never    Marital Status: Married  Catering manager Violence: Not At Risk (11/08/2023)   Humiliation, Afraid, Rape, and Kick questionnaire    Fear of Current or Ex-Partner: No    Emotionally Abused: No    Physically Abused: No    Sexually Abused: No   Family History  Problem Relation Age of Onset   Diabetes Mother    Heart disease Mother    Stroke Mother    Stroke Father    Leukemia Maternal Aunt        dx >50   Breast cancer Paternal Aunt        dx >50   Stomach cancer Cousin        d. 79; pat male cousin      VITAL SIGNS BP 138/82   Pulse 60   Temp 97.6 F (36.4 C)   Resp 20   Ht 5' 8 (1.727 m)   Wt 216 lb 12.8 oz (98.3 kg)   SpO2 95%   BMI 32.96 kg/m   Outpatient Encounter Medications as of 01/16/2024  Medication Sig   ciprofloxacin  (CIPRO ) 500 MG tablet Take 1 tablet (500 mg total) by mouth every 12 (twelve) hours.   darolutamide  (NUBEQA ) 300 MG tablet Take 2 tablets (600 mg total) by mouth 2 (two) times daily with a meal.   finasteride  (PROSCAR ) 5 MG tablet TAKE 1 TABLET(5 MG) BY MOUTH DAILY   losartan  (COZAAR ) 100 MG tablet TAKE 1 TABLET BY MOUTH DAILY    silodosin  (RAPAFLO ) 8 MG CAPS capsule Take 8 mg by mouth daily with breakfast.   simvastatin  (ZOCOR ) 20 MG tablet TAKE 1 TABLET(20 MG) BY MOUTH DAILY AT 6  PM   traZODone  (DESYREL ) 150 MG tablet Take 1 tablet (150 mg total) by mouth at bedtime.   triamcinolone  cream (KENALOG ) 0.1 % Apply 1 Application topically 2 (two) times daily.   acetaminophen  (TYLENOL ) 325 MG tablet Take 650 mg by mouth every 6 (six) hours as needed (pain and sleep aid). (Patient not taking: Reported on 01/16/2024)   cetirizine  (ZYRTEC ) 10 MG tablet Take 1 tablet (10 mg total) by mouth daily. (Patient not taking: Reported on 01/16/2024)   Cholecalciferol (VITAMIN D3) 25 MCG (1000 UT) CAPS Take 1 capsule by mouth daily. (Patient not taking: Reported on 01/16/2024)   No facility-administered encounter medications on file as of 01/16/2024.     SIGNIFICANT DIAGNOSTIC EXAMS  Review of Systems  Constitutional:  Negative for malaise/fatigue.  Respiratory:  Negative for cough and shortness of breath.   Cardiovascular:  Negative for chest pain, palpitations and leg swelling.  Gastrointestinal:  Negative for abdominal pain, constipation and heartburn.  Genitourinary:        Has catheter is draining   Musculoskeletal:  Negative for back pain, joint pain and myalgias.  Skin: Negative.   Neurological:  Negative for dizziness.  Psychiatric/Behavioral:  The patient is not nervous/anxious.    Physical Exam Constitutional:      General: He is not in acute distress.    Appearance: He is well-developed. He is not diaphoretic.  Neck:     Thyroid: No thyromegaly.  Cardiovascular:     Rate and Rhythm: Normal rate and regular rhythm.     Heart sounds: Normal heart sounds.  Pulmonary:     Effort: Pulmonary effort is normal. No respiratory distress.     Breath sounds: Normal breath sounds.  Abdominal:     General: Bowel sounds are normal. There is no distension.     Palpations: Abdomen is soft.     Tenderness: There is no abdominal  tenderness.  Musculoskeletal:        General: Normal range of motion.     Cervical back: Neck supple.     Right lower leg: No edema.     Left lower leg: No edema.  Lymphadenopathy:     Cervical: No cervical adenopathy.  Skin:    General: Skin is warm and dry.  Neurological:     Mental Status: He is alert and oriented to person, place, and time.  Psychiatric:        Mood and Affect: Mood normal.      ASSESSMENT/ PLAN:  TODAY  Urinary retention: has foley has been seen by urology; for catheter change. I have suggested that he investigate a suprapubic catheter with his urologist. As this may prevent clogging of his catheter tube.    Barnie Seip NP [facility]  Contact [phone] Monday through Friday 8am- 5pm  After hours call [phone]

## 2024-01-28 ENCOUNTER — Ambulatory Visit (HOSPITAL_COMMUNITY)
Admission: RE | Admit: 2024-01-28 | Discharge: 2024-01-28 | Disposition: A | Discharge status: Home / Self Care | Source: Ambulatory Visit | Attending: Vascular Surgery | Admitting: Vascular Surgery

## 2024-01-28 DIAGNOSIS — I7143 Infrarenal abdominal aortic aneurysm, without rupture: Secondary | ICD-10-CM | POA: Diagnosis present

## 2024-01-28 LAB — POCT I-STAT CREATININE: Creatinine, Ser: 0.7 mg/dL (ref 0.61–1.24)

## 2024-01-28 MED ORDER — IOHEXOL 350 MG/ML SOLN
75.0000 mL | Freq: Once | INTRAVENOUS | Status: AC | PRN
  Administered 2024-01-28: 75 mL via INTRAVENOUS

## 2024-01-28 MED ORDER — SODIUM CHLORIDE (PF) 0.9 % IJ SOLN
INTRAMUSCULAR | Status: AC
  Filled 2024-01-28: qty 50

## 2024-01-31 NOTE — Assessment & Plan Note (Addendum)
 Continue ADT (lat in June 2025, 6 mo. Dose) Continue darolutamide  (06/29/23).  He is on low-dose simvastatin , no added toxicity. Return to see me with PSA, testosterone  in about 6-8 weeks Radiation completed on L hip/pelvis 30 Gy/10 fx

## 2024-01-31 NOTE — Progress Notes (Unsigned)
 [facility] OFFICE PROGRESS NOTE  Patient Care Team: Gil Greig BRAVO, NP as PCP - General (Adult Health Nurse Practitioner) Charmayne Molly, MD as Consulting Physician (Ophthalmology) Gil Greig BRAVO, NP as Nurse Practitioner (Adult Health Nurse Practitioner) Vertell Pont, RN as Oncology Nurse Navigator  Mr. Dauria is a 78 y.o. man with history of hypertension, hyperlipidemia here for follow-up prostate cancer.  He has borderline PS with AAA.  Current Diagnosis: mHSPC Initial diagnosis: 08/2022. mHSPC with lymph node, bone, lung metastases. From TURP Gleason score 4+5=9 (grade group5)  Previous treatment: Had one dose of docetaxel . Given the AAA, discuss potential rupture which we cannot predict. After discussion will use lower dose also with his age.  Current Treatment: ADT, darolutamide  (started 06/29/2023). Genetic testing for metastatic prostate cancer: neg. 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   Somatic testing, no actionable mutation.  Variant of SF3B1, TP53, CTNNB1. pMMR. TMB 3.7. No HRR mutation.   Getting ADT through [facility] and also monthly Xgeva  and catheter exchange there.  Report he would not go through any scan due to claustrophobia and cannot have driver.  Report that he is sedentary most of the day.  Reported he gets fatigued and tired very easily and cannot very much.  He also has some memory loss.  He has limited functional status.  We discussed that cytotoxic chemotherapy and more treatment will potentially result in more toxicity, side effects.  We discussed palliative care, and hospice if disease progression.  We discussed goals of care today.  Discussed hospice care when having disease progression to keep him comfortable at home and focus on quality of life.  Recommend discussed this with palliative care next month as well.   Assessment & Plan Prostate cancer metastatic to bone [facility]) Continue ADT (lat in June 2025, 6 mo.  Dose) Continue darolutamide  (06/29/23).  He is on low-dose simvastatin , no added toxicity. Return to see me with PSA, testosterone  in about 6-8 weeks Radiation completed on L hip/pelvis 30 Gy/10 fx At risk for side effect of medication baseline bone mineral density: 04/06/2023 Right Femur Neck  T= -1.8 osteopenia  calcium (1000-1200 mg daily from food and supplements) and vitamin D3 (1000 IU daily) Control and prevent diabetes He has no teeth. He get Xgeva  at [facility] monthly Recommend exercises (30 minutes per day) Limit alcohol consumption and avoid smoking Monitoring control blood pressure Essential hypertension On losartan  100 mg daily  Normocytic anemia B12, folate and ferritin Goals of care, counseling/discussion Discussed today, if progression palliative care and hospice given he has limited PS. Will refer to palliative care and follow up with visit next month  Orders Placed This Encounter  Procedures   CBC with Differential (Cancer Center Only)    Standing Status:   Future    Expiration Date:   01/31/2025   CMP (Cancer Center only)    Standing Status:   Future    Expiration Date:   01/31/2025   Testosterone     Standing Status:   Future    Expiration Date:   01/31/2025   Prostate-Specific AG, Serum    Standing Status:   Future    Expiration Date:   01/31/2025   Vitamin B12    Standing Status:   Future    Expiration Date:   01/31/2025   Folate    Standing Status:   Future    Expiration Date:   01/31/2025   Ferritin    Standing Status:   Future  Expiration Date:   01/31/2025   Amb Referral to Palliative Care    Referral Priority:   Routine    Referral Type:   Consultation    Referral Reason:   Advance Care Planning    Number of Visits Requested:   1     [name], MD  INTERVAL HISTORY: Patient returns for follow-up. No fever, chills, no bloody urine. No new bone pain or hip pain or back pain. Monthly catheter change at [facility] with Xgeva .  Report his memory is  getting worse.   Oncology History  Prostate cancer metastatic to bone [facility])  08/2022 Surgery   TURP for BPH: Prostatic adenocarcinoma, Gleason score 4+5=9 (grade group5) involves 80% of the resected tissue.  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    08/2022 Initial Diagnosis   Prostate cancer metastatic to bone (HCC)   09/28/2022 Tumor Marker   PSA 33.5   10/30/2022 Imaging   PSMA PET IMPRESSION: 1. There is diffuse radiotracer uptake throughout the prostate gland compatible with residual/recurrent prostate cancer. 2. Bilateral tracer avid pelvic lymph nodes compatible with nodal metastasis. 3. Multifocal tracer avid bone metastases. 4. Solitary tracer avid pulmonary nodule within the right upper lobe is identified. 5. 5.2 cm infrarenal abdominal aortic aneurysm. Recommend follow-up every 6 months and vascular consultation. This recommendation follows ACR consensus guidelines: White Paper of the ACR Incidental Findings Committee II on Vascular Findings. J Am Coll Radiol 2013; 10:789-794. Aortic aneurysm NOS (ICD10-I71.9). 6.  Aortic Atherosclerosis (ICD10-I70.0).   11/2022 -  Chemotherapy   started Orgovynx and converted to Eligard .   03/02/2023 Tumor Marker   PSA 14.8 testosterone  19.7. alk phos 77   04/09/2023 Genetic Testing   Negative CustomNext-Cancer +RNAinsight Panel.  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  Report date is 04/09/2023.   The CustomNext-Cancer+RNAinsight panel offered by [facility] includes sequencing, rearrangement, and RNA analysis for the following 47 genes:  APC, ATM, AXIN2, BAP1, BARD1, BMPR1A, BRCA1, BRCA2, BRIP1, CDH1, CDK4, CDKN2A, CHEK2, CTNNA1, DICER1, EPCAM, FH, GREM1, HOXB13, KIT, MEN1, MLH1, MSH2, MSH3, MSH6, MUTYH, NF1, NTHL1, PALB2, PDGFRA, PMS2, POLD1, POLE, PTEN, RAD51C, RAD51D, SDHA, SDHB, SDHC,  SDHD, SMAD4, SMARCA4, STK11, TP53, TSC1, TSC2, VHL.     04/2023 -  Chemotherapy   Started Xtandi 160 mg.   06/29/2023 - 06/29/2023 Chemotherapy   Patient is on Treatment Plan : PROSTATE Docetaxel  (50) q14d      Current Outpatient Medications on File Prior to Visit  Medication Sig Dispense Refill   acetaminophen  (TYLENOL ) 325 MG tablet Take 650 mg by mouth every 6 (six) hours as needed (pain and sleep aid). (Patient not taking: Reported on 01/16/2024)     cetirizine  (ZYRTEC ) 10 MG tablet Take 1 tablet (10 mg total) by mouth daily. (Patient not taking: Reported on 01/16/2024) 30 tablet 11   Cholecalciferol (VITAMIN D3) 25 MCG (1000 UT) CAPS Take 1 capsule by mouth daily. (Patient not taking: Reported on 01/16/2024)     ciprofloxacin  (CIPRO ) 500 MG tablet Take 1 tablet (500 mg total) by mouth every 12 (twelve) hours. 14 tablet 0   darolutamide  (NUBEQA ) 300 MG tablet Take 2 tablets (600 mg total) by mouth 2 (two) times daily with a meal. 120 tablet 11   finasteride  (PROSCAR ) 5 MG tablet TAKE 1 TABLET(5 MG) BY MOUTH DAILY 90 tablet 1   losartan  (COZAAR ) 100 MG tablet  TAKE 1 TABLET BY MOUTH DAILY 90 tablet 3   silodosin  (RAPAFLO ) 8 MG CAPS capsule Take 1 capsule (8 mg total) by mouth daily with breakfast. 30 capsule 3   simvastatin  (ZOCOR ) 20 MG tablet TAKE 1 TABLET(20 MG) BY MOUTH DAILY AT 6 PM 90 tablet 1   traZODone  (DESYREL ) 150 MG tablet Take 1 tablet (150 mg total) by mouth at bedtime. 90 tablet 2   triamcinolone  cream (KENALOG ) 0.1 % Apply 1 Application topically 2 (two) times daily. 30 g 4   No current facility-administered medications on file prior to visit.     PHYSICAL EXAMINATION: ECOG PERFORMANCE STATUS: 2 - Symptomatic, <50% confined to bed  Vitals:   02/01/24 0845  BP: (!) 142/77  Pulse: (!) 52  Resp: 18  Temp: 97.7 F (36.5 C)  SpO2: 96%   Filed Weights   02/01/24 0845  Weight: 218 lb 1.6 oz (98.9 kg)   GENERAL: alert, no distress and comfortable LUNGS:  normal  breathing effort HEART: regular rate & rhythm  Musculoskeletal: no edema NEURO: no focal motor/sensory deficits   Relevant data reviewed during this visit included labs and imaging.

## 2024-02-01 ENCOUNTER — Inpatient Hospital Stay

## 2024-02-01 ENCOUNTER — Inpatient Hospital Stay (HOSPITAL_BASED_OUTPATIENT_CLINIC_OR_DEPARTMENT_OTHER)

## 2024-02-01 VITALS — BP 142/77 | HR 52 | Temp 97.7°F | Resp 18 | Wt 218.1 lb

## 2024-02-01 DIAGNOSIS — I10 Essential (primary) hypertension: Secondary | ICD-10-CM | POA: Diagnosis not present

## 2024-02-01 DIAGNOSIS — C61 Malignant neoplasm of prostate: Secondary | ICD-10-CM | POA: Diagnosis not present

## 2024-02-01 DIAGNOSIS — Z79899 Other long term (current) drug therapy: Secondary | ICD-10-CM | POA: Diagnosis not present

## 2024-02-01 DIAGNOSIS — R413 Other amnesia: Secondary | ICD-10-CM | POA: Insufficient documentation

## 2024-02-01 DIAGNOSIS — C7800 Secondary malignant neoplasm of unspecified lung: Secondary | ICD-10-CM | POA: Diagnosis not present

## 2024-02-01 DIAGNOSIS — Z7189 Other specified counseling: Secondary | ICD-10-CM

## 2024-02-01 DIAGNOSIS — D649 Anemia, unspecified: Secondary | ICD-10-CM

## 2024-02-01 DIAGNOSIS — Z923 Personal history of irradiation: Secondary | ICD-10-CM | POA: Insufficient documentation

## 2024-02-01 DIAGNOSIS — R5383 Other fatigue: Secondary | ICD-10-CM | POA: Insufficient documentation

## 2024-02-01 DIAGNOSIS — C7951 Secondary malignant neoplasm of bone: Secondary | ICD-10-CM | POA: Insufficient documentation

## 2024-02-01 DIAGNOSIS — Z9189 Other specified personal risk factors, not elsewhere classified: Secondary | ICD-10-CM

## 2024-02-01 LAB — CMP (CANCER CENTER ONLY)
ALT: 13 U/L (ref 0–44)
AST: 14 U/L — ABNORMAL LOW (ref 15–41)
Albumin: 3.9 g/dL (ref 3.5–5.0)
Alkaline Phosphatase: 58 U/L (ref 38–126)
Anion gap: 6 (ref 5–15)
BUN: 18 mg/dL (ref 8–23)
CO2: 28 mmol/L (ref 22–32)
Calcium: 9.2 mg/dL (ref 8.9–10.3)
Chloride: 105 mmol/L (ref 98–111)
Creatinine: 0.76 mg/dL (ref 0.61–1.24)
GFR, Estimated: >60 mL/min (ref >60)
Glucose, Bld: 147 mg/dL — ABNORMAL HIGH (ref 70–99)
Potassium: 4.3 mmol/L (ref 3.5–5.1)
Sodium: 139 mmol/L (ref 135–145)
Total Bilirubin: 0.5 mg/dL (ref 0.0–1.2)
Total Protein: 6.9 g/dL (ref 6.5–8.1)

## 2024-02-01 LAB — CBC WITH DIFFERENTIAL (CANCER CENTER ONLY)
Abs Immature Granulocytes: 0.03 10*3/uL (ref 0.00–0.07)
Basophils Absolute: 0.1 10*3/uL (ref 0.0–0.1)
Basophils Relative: 1 %
Eosinophils Absolute: 0.1 10*3/uL (ref 0.0–0.5)
Eosinophils Relative: 2 %
HCT: 37.9 % — ABNORMAL LOW (ref 39.0–52.0)
Hemoglobin: 12.8 g/dL — ABNORMAL LOW (ref 13.0–17.0)
Immature Granulocytes: 1 %
Lymphocytes Relative: 30 %
Lymphs Abs: 1.3 10*3/uL (ref 0.7–4.0)
MCH: 33.1 pg (ref 26.0–34.0)
MCHC: 33.8 g/dL (ref 30.0–36.0)
MCV: 97.9 fL (ref 80.0–100.0)
Monocytes Absolute: 0.4 10*3/uL (ref 0.1–1.0)
Monocytes Relative: 10 %
Neutro Abs: 2.4 10*3/uL (ref 1.7–7.7)
Neutrophils Relative %: 56 %
Platelet Count: 211 10*3/uL (ref 150–400)
RBC: 3.87 MIL/uL — ABNORMAL LOW (ref 4.22–5.81)
RDW: 12.8 % (ref 11.5–15.5)
WBC Count: 4.2 10*3/uL (ref 4.0–10.5)
nRBC: 0 % (ref 0.0–0.2)

## 2024-02-01 NOTE — Assessment & Plan Note (Addendum)
 On losartan 100 mg daily

## 2024-02-01 NOTE — Assessment & Plan Note (Addendum)
 Discussed today, if progression palliative care and hospice given he has limited PS. Will refer to palliative care and follow up with visit next month

## 2024-02-01 NOTE — Assessment & Plan Note (Addendum)
 baseline bone mineral density: 04/06/2023 Right Femur Neck  T= -1.8 osteopenia  calcium (1000-1200 mg daily from food and supplements) and vitamin D3 (1000 IU daily) Control and prevent diabetes He has no teeth. He get Xgeva  at [facility] monthly Recommend exercises (30 minutes per day) Limit alcohol consumption and avoid smoking Monitoring control blood pressure

## 2024-02-02 LAB — TESTOSTERONE: Testosterone: 24 ng/dL — ABNORMAL LOW (ref 264–916)

## 2024-02-02 LAB — PROSTATE-SPECIFIC AG, SERUM (LABCORP): Prostate Specific Ag, Serum: 3.3 ng/mL (ref 0.0–4.0)

## 2024-02-04 ENCOUNTER — Ambulatory Visit: Payer: Self-pay

## 2024-02-13 NOTE — Progress Notes (Unsigned)
 Patient ID: David Stamos., male   DOB: 1945/09/28, 78 y.o.   MRN: [id number]  Reason for Consult: No chief complaint on file.   Referred by Gil Greig BRAVO, NP  Subjective:     HPI  David Chatterjee. is a 78 y.o. male presenting for follow-up of infrarenal AAA.  He denies any new or unusual abdominal or back pain.  He is overall doing well.  He continues to undergo treatment for prostate cancer.  He reports that he completed chemotherapy. ***  He has also had radiation therapy.  He is a former smoker and quit in 2017.  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    Elevated PSA    Flatulence, eructation, and gas pain    Headache(784.0)    Hemorrhoids, external    Herpes zoster with unspecified complication    Hyperpotassemia    Insomnia, unspecified    Lumbago    Lumbago    Myalgia and myositis, unspecified    Nocturia    Obesity, unspecified    Osteoarthrosis, unspecified whether generalized or localized, unspecified site    Other abnormal blood chemistry    Other headache syndromes(339.89)    Pre-diabetes    Pure hyperglyceridemia    Rash and other nonspecific skin eruption    Rotator cuff (capsule) sprain    Routine general medical examination at a health care facility    Tobacco use disorder    Unspecified essential hypertension    Unspecified hypertrophic and atrophic condition of skin    Unspecified sleep apnea    Family History  Problem Relation Age of Onset   Diabetes Mother    Heart disease Mother    Stroke Mother    Stroke Father    Leukemia Maternal Aunt        dx >50   Breast cancer Paternal Aunt        dx >50    Stomach cancer Cousin        d. 14; pat male cousin   Past Surgical History:  Procedure Laterality Date   CYSTOSCOPY N/A 08/22/2022   Procedure: CYSTOSCOPY;  Surgeon: Rosalind Zachary NOVAK, MD;  Location: [facility];  Service: Urology;  Laterality: N/A;   DENTAL SURGERY  05/10/2021   Per patient   HEMORRHOID SURGERY     PROSTATE BIOPSY     TRANSURETHRAL RESECTION OF PROSTATE N/A 08/22/2022   Procedure: TRANSURETHRAL RESECTION OF THE PROSTATE (TURP);  Surgeon: Rosalind Zachary NOVAK, MD;  Location: [facility];  Service: Urology;  Laterality: N/A;   tumorectomy Left    Removed tumor from his chest.   VARICOSE VEIN SURGERY      Short Social History:  Social History   Tobacco Use   Smoking status: Former    Current packs/day: 0.00    Average packs/day: 1.5 packs/day for 55.0 years (82.5 ttl pk-yrs)    Types: Cigarettes    Start date: 07/29/1960    Quit date: 07/30/2015    Years since quitting: 8.5   Smokeless tobacco: Never   Tobacco comments:    pt thinks increased effexor  dose helped  him to quit  Substance Use Topics   Alcohol use: No    Alcohol/week: 0.0 standard drinks of alcohol    Allergies  Allergen Reactions   Oxycontin [Oxycodone Hcl] Hives and Shortness Of Breath   Penicillins Hives and Shortness Of Breath   Celebrex [Celecoxib]    Erythromycin    Tetracyclines & Related     Current Outpatient Medications  Medication Sig Dispense Refill   acetaminophen  (TYLENOL ) 325 MG tablet Take 650 mg by mouth every 6 (six) hours as needed (pain and sleep aid). (Patient not taking: Reported on 01/16/2024)     cetirizine  (ZYRTEC ) 10 MG tablet Take 1 tablet (10 mg total) by mouth daily. (Patient not taking: Reported on 01/16/2024) 30 tablet 11   Cholecalciferol (VITAMIN D3) 25 MCG (1000 UT) CAPS Take 1 capsule by mouth daily. (Patient not taking: Reported on 01/16/2024)     ciprofloxacin  (CIPRO ) 500 MG tablet Take 1 tablet (500 mg total) by mouth every 12  (twelve) hours. 14 tablet 0   darolutamide  (NUBEQA ) 300 MG tablet Take 2 tablets (600 mg total) by mouth 2 (two) times daily with a meal. 120 tablet 11   finasteride  (PROSCAR ) 5 MG tablet TAKE 1 TABLET(5 MG) BY MOUTH DAILY 90 tablet 1   losartan  (COZAAR ) 100 MG tablet TAKE 1 TABLET BY MOUTH DAILY 90 tablet 3   silodosin  (RAPAFLO ) 8 MG CAPS capsule Take 1 capsule (8 mg total) by mouth daily with breakfast. 30 capsule 3   simvastatin  (ZOCOR ) 20 MG tablet TAKE 1 TABLET(20 MG) BY MOUTH DAILY AT 6 PM 90 tablet 1   traZODone  (DESYREL ) 150 MG tablet Take 1 tablet (150 mg total) by mouth at bedtime. 90 tablet 2   triamcinolone  cream (KENALOG ) 0.1 % Apply 1 Application topically 2 (two) times daily. 30 g 4   No current facility-administered medications for this visit.    REVIEW OF SYSTEMS All other systems were reviewed and are negative     Objective:  Objective   There were no vitals filed for this visit.  There is no height or weight on file to calculate BMI.  Physical Exam General: no acute distress Cardiac: hemodynamically stable Pulm: normal work of breathing Abdomen: non-tender, no pulsatile mass palpated due to body habitus Neuro: alert, no focal deficit Extremities: no edema, cyanosis or wounds Vascular:   Right: palpable femoral, PT,  Left: palpable femoral, PT  Data: CTA independently reviewed 5.2 cm juxtarenal AAA.  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     Assessment/Plan:     David Kibbe. is a 78 y.o. 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  We discussed the threshold for repair and good surgical candidates being 5.0 cm for the AAA component and 3.5 cm for the iliac component.  I explained that he is currently meets the threshold for repair of both his aortic aneurysm as well as his iliac aneurysm.   We discussed the risks and benefits of open and endovascular repair.  I explained that I would recommend an  endovascular repair given his history of metastatic prostate cancer requiring chemotherapy and radiation.  We did discuss that both aneurysms would be fixed during the same time but would require a fenestrated repair meaning the stent graft would go up into the visceral segment with fenestrations made to line up with his renal arteries and possibly SMA.  I explained the risks of this including possibly losing a  kidney.  He expressed understanding and elected to proceed.  Will plan for TEVAR with David [name] device and David [name] (iliac branch system) for the R iliac aneurysm       Norman GORMAN Serve MD [facility]

## 2024-02-15 ENCOUNTER — Other Ambulatory Visit: Payer: Self-pay | Admitting: *Deleted

## 2024-02-15 ENCOUNTER — Ambulatory Visit: Attending: Vascular Surgery | Admitting: Vascular Surgery

## 2024-02-15 ENCOUNTER — Encounter: Payer: Self-pay | Admitting: Vascular Surgery

## 2024-02-15 VITALS — BP 154/82 | HR 71 | Temp 98.2°F | Resp 18 | Ht 68.0 in | Wt 215.4 lb

## 2024-02-15 DIAGNOSIS — I723 Aneurysm of iliac artery: Secondary | ICD-10-CM

## 2024-02-15 DIAGNOSIS — I7142 Juxtarenal abdominal aortic aneurysm, without rupture: Secondary | ICD-10-CM

## 2024-02-29 NOTE — Progress Notes (Signed)
 [facility] OFFICE PROGRESS NOTE  Patient Care Team: David Greig BRAVO, NP as PCP - General (Adult Health Nurse Practitioner) David Molly, [name] as Consulting Physician (Ophthalmology) David Greig BRAVO, NP as Nurse Practitioner (Adult Health Nurse Practitioner) David Pont, RN as Oncology Nurse Navigator  David [name] is a 78 y.o. man with history of hypertension, hyperlipidemia here for follow-up prostate cancer.   He has borderline PS with AAA.   Current Diagnosis: mHSPC Initial diagnosis: 08/2022. mHSPC with lymph node, bone, lung metastases. From TURP Gleason score 4+5=9 (grade group5)  Previous treatment: Had one dose of docetaxel . Given the AAA, discuss potential rupture which we cannot predict. After discussion will use lower dose also with his age.  Current Treatment: ADT, darolutamide  (started 06/29/2023). Genetic testing for metastatic prostate cancer: neg. 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   Somatic testing, no actionable mutation.  Variant of SF3B1, TP53, CTNNB1. pMMR. TMB 3.7. No HRR mutation.   [facility] through [facility] and also monthly Xgeva  and catheter exchange there.  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  We had another discussion today on need to obtain restaging imaging.  We discussed if all the goal progression, we can consider radiation to oligometastasis.  Sometimes if less than 5 metastases, we can still recommend radiation.  If more than that, then systemic treatment is recommended.  We discussed can consider chemotherapy versus Pluvicto.  Discussed treatment will be palliative as this is not considered curable.  Each treatment has PFS measures in terms of months.  He still has fairly good performance status.  He is a candidate for AAA repair.  He is only complaining of urinary catheter making him uncomfortable.  He will benefit from suprapubic catheter placement.    After discussion, he is willing to  proceed with PET scan.  He does not have a driver.  Report of claustrophobia.  I will prescribe Ativan  and see if we can have transportation bring him for PET scan taking back home.  Will also meet with palliative care today to discuss advance care planning. Assessment & Plan Prostate cancer metastatic to bone [facility]) Continue ADT (lat in June 2025, 6 mo. [facility]ose at [facility]) Continue darolutamide  (06/29/23). Supply via mail He is on low-dose simvastatin , no added toxicity. Return to see me with PSA, testosterone  in about 6-8 weeks Radiation completed on L hip/pelvis 30 Gy/10 fx At risk for side effect of medication baseline bone mineral density: 04/06/2023 Right Femur Neck  T= -1.8 osteopenia  calcium  (1000-1200 mg daily from food and supplements) and vitamin D3 (1000 IU daily) Control and prevent diabetes He has no teeth. He get [facility]  at [facility] monthly Recommend exercises (30 minutes per day) Limit alcohol consumption and avoid smoking Monitoring control blood pressure Osteopenia of right hip calcium  (1000-1200 mg daily from food and supplements) and vitamin D3 (1000 IU daily) Prescription sent today Hypertriglyceridemia On simvastatin . Goals of care, counseling/discussion Discussed palliative treatment.  Treatment unfortunately not curative.  David [name] care planning is recommended. Will see palliative care today Normocytic anemia Follow-up B12, folate and ferritin today.  Orders Placed This Encounter  Procedures   NM PET (PSMA) SKULL TO MID THIGH    Standing Status:   Future    Expected Date:   03/17/2024    Expiration Date:   03/03/2025    If indicated for the ordered procedure, I authorize the administration of a radiopharmaceutical per Radiology protocol:   Yes    Preferred  imaging location?:   David [name]  INTERVAL HISTORY: Patient returns for follow-up.  Overall no clinical changes.  No new bone pain or back pain.  He met with vascular surgery and is  considering AAA repair.  His only complaint is urinary catheter making uncomfortable and reduced his mobility.  Oncology History  Prostate cancer metastatic to bone [facility])  08/2022 Surgery   TURP for BPH: Prostatic adenocarcinoma, Gleason score 4+5=9 (grade group5) involves 80% of the resected tissue.  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    08/2022 Initial Diagnosis   Prostate cancer metastatic to bone (HCC)   09/28/2022 Tumor Marker   PSA 33.5   10/30/2022 Imaging   PSMA PET IMPRESSION: 1. There is diffuse radiotracer uptake throughout the prostate gland compatible with residual/recurrent prostate cancer. 2. Bilateral tracer avid pelvic lymph nodes compatible with nodal metastasis. 3. Multifocal tracer avid bone metastases. 4. Solitary tracer avid pulmonary nodule within the right upper lobe is identified. 5. 5.2 cm infrarenal abdominal aortic aneurysm. Recommend follow-up every 6 months and vascular consultation. This recommendation follows ACR consensus guidelines: White Paper of the ACR Incidental Findings Committee II on Vascular Findings. J Am Coll Radiol 2013; 10:789-794. Aortic aneurysm NOS (ICD10-I71.9). 6.  Aortic Atherosclerosis (ICD10-I70.0).   11/2022 -  Chemotherapy   started Orgovynx and converted to Eligard .   03/02/2023 Tumor Marker   PSA 14.8 testosterone  19.7. alk phos 77   04/09/2023 Genetic Testing   Negative CustomNext-Cancer +RNAinsight Panel.  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  Report date is 04/09/2023.   The CustomNext-Cancer+RNAinsight panel offered by [facility] includes sequencing, rearrangement, and RNA analysis for the following 47 genes:  APC, ATM, AXIN2, BAP1, BARD1, BMPR1A, BRCA1, BRCA2, BRIP1, CDH1, CDK4, CDKN2A, CHEK2, CTNNA1, DICER1, EPCAM, FH, GREM1, HOXB13, KIT, MEN1, MLH1, MSH2, MSH3, MSH6,  MUTYH, NF1, NTHL1, PALB2, PDGFRA, PMS2, POLD1, POLE, PTEN, RAD51C, RAD51D, SDHA, SDHB, SDHC, SDHD, SMAD4, SMARCA4, STK11, TP53, TSC1, TSC2, VHL.     04/2023 -  Chemotherapy   Started Xtandi 160 mg.   06/29/2023 - 06/29/2023 Chemotherapy   Patient is on Treatment Plan : PROSTATE Docetaxel  (50) q14d        PHYSICAL EXAMINATION: ECOG PERFORMANCE STATUS: 1 - Symptomatic but completely ambulatory  Vitals:   03/03/24 0825  BP: (!) 148/69  Pulse: 60  Resp: 17  Temp: 97.8 F (36.6 C)  SpO2: 97%   Filed Weights   03/03/24 0825  Weight: 216 lb 1.6 oz (98 kg)   GENERAL: alert, no distress and comfortable SKIN: skin color normal and no jaundice on exposed skin LUNGS:  normal breathing effort Musculoskeletal: no point tenderness on the spine Urinary catheter in place.   Relevant data reviewed during this visit included labs.

## 2024-03-03 ENCOUNTER — Inpatient Hospital Stay (HOSPITAL_BASED_OUTPATIENT_CLINIC_OR_DEPARTMENT_OTHER)

## 2024-03-03 ENCOUNTER — Inpatient Hospital Stay

## 2024-03-03 ENCOUNTER — Inpatient Hospital Stay (HOSPITAL_BASED_OUTPATIENT_CLINIC_OR_DEPARTMENT_OTHER): Admitting: Nurse Practitioner

## 2024-03-03 ENCOUNTER — Other Ambulatory Visit: Payer: Self-pay | Admitting: *Deleted

## 2024-03-03 ENCOUNTER — Encounter: Payer: Self-pay | Admitting: Nurse Practitioner

## 2024-03-03 VITALS — BP 148/69 | HR 60 | Temp 97.8°F | Resp 17 | Ht 68.0 in | Wt 216.1 lb

## 2024-03-03 DIAGNOSIS — M85851 Other specified disorders of bone density and structure, right thigh: Secondary | ICD-10-CM | POA: Diagnosis not present

## 2024-03-03 DIAGNOSIS — Z515 Encounter for palliative care: Secondary | ICD-10-CM | POA: Diagnosis not present

## 2024-03-03 DIAGNOSIS — C61 Malignant neoplasm of prostate: Secondary | ICD-10-CM | POA: Diagnosis not present

## 2024-03-03 DIAGNOSIS — C7951 Secondary malignant neoplasm of bone: Secondary | ICD-10-CM

## 2024-03-03 DIAGNOSIS — E781 Pure hyperglyceridemia: Secondary | ICD-10-CM | POA: Diagnosis not present

## 2024-03-03 DIAGNOSIS — Z9189 Other specified personal risk factors, not elsewhere classified: Secondary | ICD-10-CM

## 2024-03-03 DIAGNOSIS — D649 Anemia, unspecified: Secondary | ICD-10-CM | POA: Insufficient documentation

## 2024-03-03 DIAGNOSIS — R530 Neoplastic (malignant) related fatigue: Secondary | ICD-10-CM | POA: Diagnosis not present

## 2024-03-03 DIAGNOSIS — Z7189 Other specified counseling: Secondary | ICD-10-CM | POA: Diagnosis not present

## 2024-03-03 LAB — CBC WITH DIFFERENTIAL (CANCER CENTER ONLY)
Abs Immature Granulocytes: 0.03 K/uL (ref 0.00–0.07)
Basophils Absolute: 0.1 K/uL (ref 0.0–0.1)
Basophils Relative: 1 %
Eosinophils Absolute: 0.1 K/uL (ref 0.0–0.5)
Eosinophils Relative: 3 %
HCT: 38.5 % — ABNORMAL LOW (ref 39.0–52.0)
Hemoglobin: 12.9 g/dL — ABNORMAL LOW (ref 13.0–17.0)
Immature Granulocytes: 1 %
Lymphocytes Relative: 31 %
Lymphs Abs: 1.3 K/uL (ref 0.7–4.0)
MCH: 32.7 pg (ref 26.0–34.0)
MCHC: 33.5 g/dL (ref 30.0–36.0)
MCV: 97.7 fL (ref 80.0–100.0)
Monocytes Absolute: 0.4 K/uL (ref 0.1–1.0)
Monocytes Relative: 10 %
Neutro Abs: 2.2 K/uL (ref 1.7–7.7)
Neutrophils Relative %: 54 %
Platelet Count: 179 K/uL (ref 150–400)
RBC: 3.94 MIL/uL — ABNORMAL LOW (ref 4.22–5.81)
RDW: 13.2 % (ref 11.5–15.5)
WBC Count: 4.1 K/uL (ref 4.0–10.5)
nRBC: 0 % (ref 0.0–0.2)

## 2024-03-03 LAB — CMP (CANCER CENTER ONLY)
ALT: 13 U/L (ref 0–44)
AST: 16 U/L (ref 15–41)
Albumin: 4.2 g/dL (ref 3.5–5.0)
Alkaline Phosphatase: 60 U/L (ref 38–126)
Anion gap: 6 (ref 5–15)
BUN: 18 mg/dL (ref 8–23)
CO2: 26 mmol/L (ref 22–32)
Calcium: 8.7 mg/dL — ABNORMAL LOW (ref 8.9–10.3)
Chloride: 107 mmol/L (ref 98–111)
Creatinine: 0.66 mg/dL (ref 0.61–1.24)
GFR, Estimated: >60 mL/min (ref >60)
Glucose, Bld: 148 mg/dL — ABNORMAL HIGH (ref 70–99)
Potassium: 4.1 mmol/L (ref 3.5–5.1)
Sodium: 139 mmol/L (ref 135–145)
Total Bilirubin: 0.4 mg/dL (ref 0.0–1.2)
Total Protein: 6.7 g/dL (ref 6.5–8.1)

## 2024-03-03 LAB — FOLATE: Folate: 8.6 ng/mL (ref >5.9)

## 2024-03-03 LAB — VITAMIN B12: Vitamin B-12: 351 pg/mL (ref 180–914)

## 2024-03-03 LAB — FERRITIN: Ferritin: 190 ng/mL (ref 24–336)

## 2024-03-03 MED ORDER — LORAZEPAM 0.5 MG PO TABS: ORAL_TABLET | ORAL | 0 refills | Status: DC

## 2024-03-03 MED ORDER — VITAMIN D3 25 MCG (1000 UT) PO CAPS: 1.0000 | ORAL_CAPSULE | Freq: Every day | ORAL | 1 refills | Status: DC

## 2024-03-03 MED ORDER — OYSTER SHELL CALCIUM/D3 500-5 MG-MCG PO TABS: 1.0000 | ORAL_TABLET | Freq: Every day | ORAL | 1 refills | Status: DC

## 2024-03-03 NOTE — Assessment & Plan Note (Addendum)
 Follow-up B12, folate and ferritin today.

## 2024-03-03 NOTE — Assessment & Plan Note (Addendum)
 On simvastatin .

## 2024-03-03 NOTE — Progress Notes (Signed)
 Palliative Medicine [facility]  [phone] [phone]   Name: David [name]. Date: 03/03/2024 MRN: [id number]  DOB: 02-Nov-1945  Patient Care Team: Gil Greig BRAVO, NP as PCP - General (Adult Health Nurse Practitioner) Charmayne Molly, MD as Consulting Physician (Ophthalmology) Gil Greig BRAVO, NP as Nurse Practitioner (Adult Health Nurse Practitioner) Vertell Pont, RN as Oncology Nurse Navigator    REASON FOR CONSULTATION: David [name]. is a 78 y.o. male with oncologic medical history including metastatic prostate cancer with lymph node, lung, and bone involvement.  Palliative is seeing patient for symptom management and goals of care.    SOCIAL HISTORY:     reports that he quit smoking about 8 years ago. His smoking use included cigarettes. He started smoking about 63 years ago. He has a 82.5 pack-year smoking history. He has never used smokeless tobacco. He reports that he does not drink alcohol and does not use drugs.  ADVANCE DIRECTIVES:  None on file   CODE STATUS: Full code  PAST MEDICAL HISTORY: Past Medical History:  Diagnosis Date   AAA (abdominal aortic aneurysm) (HCC)    Allergic rhinitis due to pollen    Anxiety state, unspecified    Asymptomatic varicose veins    Atherosclerosis of native arteries of the extremities, unspecified    Cataract 10/17/2016   L Eye   Contact dermatitis and other eczema due to other chemical products    Diaphragmatic hernia without mention of obstruction or gangrene    Elevated PSA    Flatulence, eructation, and gas pain    Headache(784.0)    Hemorrhoids, external    Herpes zoster with unspecified complication    Hyperpotassemia    Insomnia, unspecified    Lumbago    Lumbago    Myalgia and myositis, unspecified    Nocturia    Obesity, unspecified    Osteoarthrosis, unspecified whether generalized or localized, unspecified site    Other abnormal blood chemistry    Other  headache syndromes(339.89)    Pre-diabetes    Pure hyperglyceridemia    Rash and other nonspecific skin eruption    Rotator cuff (capsule) sprain    Routine general medical examination at a health care facility    Tobacco use disorder    Unspecified essential hypertension    Unspecified hypertrophic and atrophic condition of skin    Unspecified sleep apnea     PAST SURGICAL HISTORY:  Past Surgical History:  Procedure Laterality Date   CYSTOSCOPY N/A 08/22/2022   Procedure: CYSTOSCOPY;  Surgeon: Rosalind Zachary NOVAK, MD;  Location: [facility];  Service: Urology;  Laterality: N/A;   DENTAL SURGERY  05/10/2021   Per patient   HEMORRHOID SURGERY     PROSTATE BIOPSY     TRANSURETHRAL RESECTION OF PROSTATE N/A 08/22/2022   Procedure: TRANSURETHRAL RESECTION OF THE PROSTATE (TURP);  Surgeon: Rosalind Zachary NOVAK, MD;  Location: [facility];  Service: Urology;  Laterality: N/A;   tumorectomy Left    Removed tumor from his chest.   VARICOSE VEIN SURGERY      HEMATOLOGY/ONCOLOGY HISTORY:  Oncology History  Prostate cancer metastatic to bone (HCC)  08/2022 Surgery   TURP for BPH: Prostatic adenocarcinoma, Gleason score 4+5=9 (grade group5) involves 80% of the resected tissue.  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    08/2022 Initial  Diagnosis   Prostate cancer metastatic to bone (HCC)   09/28/2022 Tumor Marker   PSA 33.5   10/30/2022 Imaging   PSMA PET IMPRESSION: 1. There is diffuse radiotracer uptake throughout the prostate gland compatible with residual/recurrent prostate cancer. 2. Bilateral tracer avid pelvic lymph nodes compatible with nodal metastasis. 3. Multifocal tracer avid bone metastases. 4. Solitary tracer avid pulmonary nodule within the right upper lobe is identified. 5. 5.2 cm infrarenal  abdominal aortic aneurysm. Recommend follow-up every 6 months and vascular consultation. This recommendation follows ACR consensus guidelines: White Paper of the ACR Incidental Findings Committee II on Vascular Findings. J Am Coll Radiol 2013; 10:789-794. Aortic aneurysm NOS (ICD10-I71.9). 6.  Aortic Atherosclerosis (ICD10-I70.0).   11/2022 -  Chemotherapy   started Orgovynx and converted to Eligard .   03/02/2023 Tumor Marker   PSA 14.8 testosterone  19.7. alk phos 77   04/09/2023 Genetic Testing   Negative CustomNext-Cancer +RNAinsight Panel.  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  Report date is 04/09/2023.   The CustomNext-Cancer+RNAinsight panel offered by [facility] includes sequencing, rearrangement, and RNA analysis for the following 47 genes:  APC, ATM, AXIN2, BAP1, BARD1, BMPR1A, BRCA1, BRCA2, BRIP1, CDH1, CDK4, CDKN2A, CHEK2, CTNNA1, DICER1, EPCAM, FH, GREM1, HOXB13, KIT, MEN1, MLH1, MSH2, MSH3, MSH6, MUTYH, NF1, NTHL1, PALB2, PDGFRA, PMS2, POLD1, POLE, PTEN, RAD51C, RAD51D, SDHA, SDHB, SDHC, SDHD, SMAD4, SMARCA4, STK11, TP53, TSC1, TSC2, VHL.     04/2023 -  Chemotherapy   Started Xtandi 160 mg.   06/29/2023 - 06/29/2023 Chemotherapy   Patient is on Treatment Plan : PROSTATE Docetaxel  (50) q14d       ALLERGIES:  is allergic to oxycontin [oxycodone hcl], penicillins, celebrex [celecoxib], erythromycin, and tetracyclines & related.  MEDICATIONS:  Current Outpatient Medications  Medication Sig Dispense Refill   acetaminophen  (TYLENOL ) 325 MG tablet Take 650 mg by mouth every 6 (six) hours as needed (pain and sleep aid).     calcium -vitamin D (OSCAL WITH D) 500-5 MG-MCG tablet Take 1 tablet by mouth daily at 6 (six) AM. 90 tablet 1   cetirizine  (ZYRTEC ) 10 MG tablet Take 1 tablet (10 mg total) by mouth daily. 30 tablet 11   Cholecalciferol (VITAMIN D3) 25 MCG (1000 UT) CAPS Take 1 capsule (1,000 Units total) by mouth daily. 90 capsule 1   ciprofloxacin  (CIPRO ) 500 MG tablet Take  1 tablet (500 mg total) by mouth every 12 (twelve) hours. 14 tablet 0   darolutamide  (NUBEQA ) 300 MG tablet Take 2 tablets (600 mg total) by mouth 2 (two) times daily with a meal. 120 tablet 11   finasteride  (PROSCAR ) 5 MG tablet TAKE 1 TABLET(5 MG) BY MOUTH DAILY 90 tablet 1   LORazepam  (ATIVAN ) 0.5 MG tablet Before PET scan 1 tablet 0   losartan  (COZAAR ) 100 MG tablet TAKE 1 TABLET BY MOUTH DAILY 90 tablet 3   silodosin  (RAPAFLO ) 8 MG CAPS capsule Take 1 capsule (8 mg total) by mouth daily with breakfast. 30 capsule 3   simvastatin  (ZOCOR ) 20 MG tablet TAKE 1 TABLET(20 MG) BY MOUTH DAILY AT 6 PM 90 tablet 1   traZODone  (DESYREL ) 150 MG tablet Take 1 tablet (150 mg total) by mouth at bedtime. 90 tablet 2   triamcinolone  cream (KENALOG ) 0.1 % Apply 1 Application topically 2 (two) times daily. 30 g 4   No current facility-administered medications for this visit.    VITAL SIGNS: There were no vitals taken for this visit. There were no vitals filed for this visit.  Estimated  body mass index is 32.86 kg/m as calculated from the following:   Height as of an earlier encounter on 03/03/24: 5' 8 (1.727 m).   Weight as of an earlier encounter on 03/03/24: 216 lb 1.6 oz (98 kg).  LABS: CBC:    Component Value Date/Time   WBC 4.1 03/03/2024 0804   WBC 5.5 01/04/2024 1152   HGB 12.9 (L) 03/03/2024 0804   HCT 38.5 (L) 03/03/2024 0804   PLT 179 03/03/2024 0804   MCV 97.7 03/03/2024 0804   NEUTROABS 2.2 03/03/2024 0804   NEUTROABS 4.0 10/13/2014 0817   LYMPHSABS 1.3 03/03/2024 0804   LYMPHSABS 2.4 10/13/2014 0817   MONOABS 0.4 03/03/2024 0804   EOSABS 0.1 03/03/2024 0804   EOSABS 0.2 10/13/2014 0817   BASOSABS 0.1 03/03/2024 0804   BASOSABS 0.1 10/13/2014 0817   Comprehensive Metabolic Panel:    Component Value Date/Time   NA 139 03/03/2024 0804   NA 138 11/03/2015 0835   K 4.1 03/03/2024 0804   CL 107 03/03/2024 0804   CO2 26 03/03/2024 0804   BUN 18 03/03/2024 0804   BUN 16  11/03/2015 0835   CREATININE 0.66 03/03/2024 0804   CREATININE 0.71 12/25/2022 0825   GLUCOSE 148 (H) 03/03/2024 0804   CALCIUM  8.7 (L) 03/03/2024 0804   AST 16 03/03/2024 0804   ALT 13 03/03/2024 0804   ALKPHOS 60 03/03/2024 0804   BILITOT 0.4 03/03/2024 0804   PROT 6.7 03/03/2024 0804   PROT 7.1 11/03/2015 0835   ALBUMIN 4.2 03/03/2024 0804   ALBUMIN 4.4 11/03/2015 0835    RADIOGRAPHIC STUDIES: No results found.  PERFORMANCE STATUS (ECOG) : 1 - Symptomatic but completely ambulatory  Review of Systems  Constitutional:  Positive for fatigue.  Genitourinary:        Foley catheter in place  Unless otherwise noted, a complete review of systems is negative.  Physical Exam General: NAD Cardiovascular: regular rate and rhythm Pulmonary: clear ant fields Abdomen: soft, nontender, + bowel sounds Extremities: no edema, no joint deformities Skin: no rashes Neurological: Alert and oriented x3  Discussed the use of AI scribe software for clinical note transcription with the patient, who gave verbal consent to proceed.  History of Present Illness David Rhines. is a 78 year old male with metastatic prostate cancer who presents to clinic for his initial palliative visit. He was referred by Dr. Tina. No acute distress noted. No family present. Patient is alert and able to engage appropriately in discussions.   I introduced myself, Maygan RN, and Palliative's role in collaboration with the oncology team. Concept of Palliative Care was introduced as specialized medical care for people and their families living with serious illness.  It focuses on providing relief from the symptoms and stress of a serious illness.  The goal is to improve quality of life for both the patient and the family. Values and goals of care important to patient and family were attempted to be elicited.  David [name] lives in the home with his wife of more than 42 years. They have 3 children. Patient does not  have biological children however he adopted his wife's and consider them his own. 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   He experiences chronic groin pain associated with the use of a urinary catheter, which he finds irritating and uncomfortable. He manages the discomfort as best as he  can but has difficulty coping with it. He takes a generic form of Tylenol , 500 mg, every night and additionally as needed for pain relief.  He takes Trazodone  150 mg at night to aid with sleep, which helps him fall asleep about ten minutes after taking it.  He reports fluctuations in bowel movements, experiencing constipation on some days and diarrhea on others. The constipation requires effort to pass stool, while on other days, the stool passes easily. No changes in appetite, nausea, or vomiting. His weight is stable around 216 pounds, with minor fluctuations.  Goals of Care We discussed his  current illness and what it means in the larger context of his on-going co-morbidities. Natural disease trajectory and expectations were discussed. David [name] is somewhat realistic in his understanding of overall health state. He shares upcoming surgery for AAA.   Patient is clear in his expressed wishes to continue taking things one day at a time allowing him every opportunity to thrive. He speaks to appreciation of no significant pain or symptoms.   David [name] shares his main concerns are finances and trying to manage the home with limited resources. He expresses concern for his wife, who has dementia, noting that she is mostly functional but occasionally does not recognize her surroundings or him. These episodes occur 'once in a blue moon.' He is unsure what her response would be if something was to happen to him.   I empathetically approached goals of care discussions including limitations in health care, code status, and advance  directives. Patient reports he does not have documents in place. I encouraged patient to consider completion of advanced directives and consideration for what an emergent event would look like given his co-morbidities. Patient has acknowledge his wife has dementia. He is aware she would not be able to serve as his healthcare surrogate if needed. He plans to consider all options and continue discussions with family.   I discussed the importance of continued conversation with family and their medical providers regarding overall plan of care and treatment options, ensuring decisions are within the context of the patients values and GOCs.  Assessment & Plan Established therapeutic relationship. Education provided on palliative's role in collaboration with their Oncology/Radiation team.  Abdominal aortic aneurysm, planned surgical repair Scheduled for surgical repair on September 4th at 5:15 AM with coordination for concurrent procedures with Dr. Pearline and Dr. Carolee per patient.  - Proceed with scheduled surgical repair on September 4th  Chronic urinary retention with indwelling catheter and planned nephrostomy Managed with an indwelling catheter, with plans to transition to a nephrostomy to alleviate discomfort. - Coordinate nephrostomy placement with urology team per patient.   Sleep disturbance, managed with trazodone  Managed with 150 mg trazodone  at bedtime, effective in inducing sleep approximately 10 minutes after administration. - Continue trazodone  150 mg at bedtime  Chronic groin pain Managed with over-the-counter Tylenol  (500 mg) as needed for pain relief, with an additional dose at bedtime with trazodone . - Continue Tylenol  500 mg as needed for pain - Take Tylenol  500 mg at bedtime with trazodone   Bowel habit fluctuation (alternating constipation and diarrhea) Bowel habits fluctuate between constipation and diarrhea.  Claustrophobia, planned pre-procedure anxiolysis Anticipated  during upcoming PET scan, with plan to administer Ativan  to manage anxiety. - Administer Ativan  prior to PET scan to manage claustrophobia  Goals of Care Expressed concern for wife's dementia and his well-being, with coordination for potential financial support and services. - Coordinate with Child psychotherapist for financial support and services -  Ensure social worker contacts him directly to avoid missed communication due to wife's dementia -Encouraged consideration of code status, completion of directives in the setting of wife's health state, and wishes.   Follow-Up Follow-up with Dr. Tina after PET scan, avoiding scheduling on Tuesdays due to wife's commitments. - I will plan to see patient back in 2-3 weeks.   Patient expressed understanding and was in agreement with this plan. He also understands that He can call the clinic at any time with any questions, concerns, or complaints.   Thank you for your referral and allowing Palliative to assist in Mr. NAZIAH WECKERLY Jr.'s care.   Number and complexity of problems addressed: HIGH - 1 or more chronic illnesses with SEVERE exacerbation, progression, or side effects of treatment - advanced cancer, pain. Any controlled substances utilized were prescribed in the context of palliative care.  Visit consisted of counseling and education dealing with the complex and emotionally intense issues of symptom management and palliative care in the setting of serious and potentially life-threatening illness.  Signed by: Levon Borer, AGPCNP-BC Palliative Medicine Team/[facility]

## 2024-03-03 NOTE — Assessment & Plan Note (Addendum)
 Discussed palliative treatment.  Treatment unfortunately not curative.  [place] care planning is recommended. Will see palliative care today

## 2024-03-03 NOTE — Assessment & Plan Note (Signed)
 baseline bone mineral density: 04/06/2023 Right Femur Neck  T= -1.8 osteopenia  calcium (1000-1200 mg daily from food and supplements) and vitamin D3 (1000 IU daily) Control and prevent diabetes He has no teeth. He get [facility]  at [facility] monthly Recommend exercises (30 minutes per day) Limit alcohol consumption and avoid smoking Monitoring control blood pressure

## 2024-03-03 NOTE — Assessment & Plan Note (Addendum)
 Continue ADT (lat in June 2025, 6 mo. Dose at [facility]) Continue darolutamide  (06/29/23). Supply via mail He is on low-dose simvastatin , no added toxicity. Return to see me with PSA, testosterone  in about 6-8 weeks Radiation completed on L hip/pelvis 30 Gy/10 fx

## 2024-03-03 NOTE — Progress Notes (Signed)
 Patient received Xgeva  120mg  on 8/21 at [facility].

## 2024-03-04 LAB — TESTOSTERONE: Testosterone: 21 ng/dL — ABNORMAL LOW (ref 264–916)

## 2024-03-04 LAB — PROSTATE-SPECIFIC AG, SERUM (LABCORP): Prostate Specific Ag, Serum: 4.8 ng/mL — ABNORMAL HIGH (ref 0.0–4.0)

## 2024-03-05 NOTE — Progress Notes (Signed)
 Surgical Instructions   Your procedure is scheduled on Tuesday, September 4th. Report to [facility] Main Entrance A at 5:30 A.M., then check in with the Admitting office. Any questions or running late day of surgery: call [phone]  Questions prior to your surgery date: call [phone], Monday-Friday, 8am-4pm. If you experience any cold or flu symptoms such as cough, fever, chills, shortness of breath, etc. between now and your scheduled surgery, please notify us  at the above number.     Remember:  Do not eat or drink after midnight the night before your surgery   Take these medicines the morning of surgery with A SIP OF WATER   cetirizine  (ZYRTEC )  finasteride  (PROSCAR )    May take these medicines IF NEEDED: acetaminophen  (TYLENOL )     One week prior to surgery, STOP taking any Aspirin (unless otherwise instructed by your surgeon) Aleve, Naproxen, Ibuprofen, Motrin, Advil, Goody's, BC's, all herbal medications, fish oil, and non-prescription vitamins.                     Do NOT Smoke (Tobacco/Vaping) for 24 hours prior to your procedure.  If you use a CPAP at night, you may bring your mask/headgear for your overnight stay.   You will be asked to remove any contacts, glasses, piercing's, hearing aid's, dentures/partials prior to surgery. Please bring cases for these items if needed.    Patients discharged the day of surgery will not be allowed to drive home, and someone needs to stay with them for 24 hours.  SURGICAL WAITING ROOM VISITATION Patients may have no more than 2 support people in the waiting area - these visitors may rotate.   Pre-op nurse will coordinate an appropriate time for 1 ADULT support person, who may not rotate, to accompany patient in pre-op.  Children under the age of 19 must have an adult with them who is not the patient and must remain in the main waiting area with an adult.  If the patient needs to stay at the hospital during part of their  recovery, the visitor guidelines for inpatient rooms apply.  Please refer to the [facility] website for the visitor guidelines for any additional information.   If you received a COVID test during your pre-op visit  it is requested that you wear a mask when out in public, stay away from anyone that may not be feeling well and notify your surgeon if you develop symptoms. If you have been in contact with anyone that has tested positive in the last 10 days please notify you surgeon.      Pre-operative CHG Bathing Instructions   You can play a key role in reducing the risk of infection after surgery. Your skin needs to be as free of germs as possible. You can reduce the number of germs on your skin by washing with CHG (chlorhexidine gluconate) soap before surgery. CHG is an antiseptic soap that kills germs and continues to kill germs even after washing.   DO NOT use if you have an allergy to chlorhexidine/CHG or antibacterial soaps. If your skin becomes reddened or irritated, stop using the CHG and notify one of our RNs at [phone].              TAKE A SHOWER THE NIGHT BEFORE SURGERY AND THE DAY OF SURGERY    Please keep in mind the following:  DO NOT shave, including legs and underarms, 48 hours prior to surgery.   You may shave your face  before/day of surgery.  Place clean sheets on your bed the night before surgery Use a clean washcloth (not used since being washed) for each shower. DO NOT sleep with pet's night before surgery.  CHG Shower Instructions:  Wash your face and private area with normal soap. If you choose to wash your hair, wash first with your normal shampoo.  After you use shampoo/soap, rinse your hair and body thoroughly to remove shampoo/soap residue.  Turn the water  OFF and apply half the bottle of CHG soap to a CLEAN washcloth.  Apply CHG soap ONLY FROM YOUR NECK DOWN TO YOUR TOES (washing for 3-5 minutes)  DO NOT use CHG soap on face, private areas, open wounds,  or sores.  Pay special attention to the area where your surgery is being performed.  If you are having back surgery, having someone wash your back for you may be helpful. Wait 2 minutes after CHG soap is applied, then you may rinse off the CHG soap.  Pat dry with a clean towel  Put on clean pajamas    Additional instructions for the day of surgery: DO NOT APPLY any lotions, deodorants, cologne, or perfumes.   Do not wear jewelry or makeup Do not wear nail polish, gel polish, artificial nails, or any other type of covering on natural nails (fingers and toes) Do not bring valuables to the hospital. [facility] is not responsible for valuables/personal belongings. Put on clean/comfortable clothes.  Please brush your teeth.  Ask your nurse before applying any prescription medications to the skin.

## 2024-03-06 ENCOUNTER — Encounter (HOSPITAL_COMMUNITY)
Admission: RE | Admit: 2024-03-06 | Discharge: 2024-03-06 | Disposition: A | Discharge status: Home / Self Care | Source: Ambulatory Visit | Attending: Vascular Surgery | Admitting: Vascular Surgery

## 2024-03-06 ENCOUNTER — Other Ambulatory Visit: Payer: Self-pay

## 2024-03-06 ENCOUNTER — Encounter (HOSPITAL_COMMUNITY): Payer: Self-pay

## 2024-03-06 VITALS — BP 141/77 | HR 58 | Temp 98.0°F | Resp 18 | Ht 69.0 in | Wt 216.9 lb

## 2024-03-06 DIAGNOSIS — I723 Aneurysm of iliac artery: Secondary | ICD-10-CM | POA: Diagnosis not present

## 2024-03-06 DIAGNOSIS — I700 Atherosclerosis of aorta: Secondary | ICD-10-CM | POA: Insufficient documentation

## 2024-03-06 DIAGNOSIS — R7303 Prediabetes: Secondary | ICD-10-CM | POA: Insufficient documentation

## 2024-03-06 DIAGNOSIS — Z79899 Other long term (current) drug therapy: Secondary | ICD-10-CM | POA: Insufficient documentation

## 2024-03-06 DIAGNOSIS — Z87891 Personal history of nicotine dependence: Secondary | ICD-10-CM | POA: Insufficient documentation

## 2024-03-06 DIAGNOSIS — Z01818 Encounter for other preprocedural examination: Secondary | ICD-10-CM | POA: Diagnosis present

## 2024-03-06 DIAGNOSIS — I7142 Juxtarenal abdominal aortic aneurysm, without rupture: Secondary | ICD-10-CM | POA: Insufficient documentation

## 2024-03-06 DIAGNOSIS — C61 Malignant neoplasm of prostate: Secondary | ICD-10-CM | POA: Insufficient documentation

## 2024-03-06 DIAGNOSIS — I10 Essential (primary) hypertension: Secondary | ICD-10-CM | POA: Insufficient documentation

## 2024-03-06 LAB — TYPE AND SCREEN
ABO/RH(D): A NEG
Antibody Screen: NEGATIVE

## 2024-03-06 LAB — APTT: aPTT: 32 s (ref 24–36)

## 2024-03-06 LAB — PROTIME-INR
INR: 1 (ref 0.8–1.2)
Prothrombin Time: 13.8 s (ref 11.4–15.2)

## 2024-03-06 LAB — SURGICAL PCR SCREEN
MRSA, PCR: NEGATIVE
Staphylococcus aureus: NEGATIVE

## 2024-03-06 NOTE — Progress Notes (Addendum)
 Patient does not feel comfortable signing his consent at this time and wants to speak with Dr. Pearline prior to surgery before he signs his consent.   PCP - Dr. Greig Cluster NP Cardiologist - Denies Oncologist - Dr. Pauletta Chihuahua  PPM/ICD - Denies Device Orders - n/a Rep Notified - n/a  Chest x-ray - N/A EKG - 03/06/24 Stress Test - Denies ECHO - Denies Cardiac Cath - Denies  Sleep Study - Denies CPAP - n/a  NON-diabetic  Last dose of GLP1 agonist-  Denies GLP1 instructions: n/a  Blood Thinner Instructions: Denies Aspirin Instructions: Denies  ERAS Protcol - NPO PRE-SURGERY Ensure or G2- N/A  COVID TEST- N/A   Anesthesia review: Yes, AAA  Patient denies shortness of breath, fever, cough and chest pain at PAT appointment. Patient denies any respiratory issues at this time.     All instructions explained to the patient, with a verbal understanding of the material. Patient agrees to go over the instructions while at home for a better understanding. Patient also instructed to self quarantine after being tested for COVID-19. The opportunity to ask questions was provided.

## 2024-03-07 NOTE — Anesthesia Preprocedure Evaluation (Addendum)
 Anesthesia Evaluation  Patient identified by MRN, date of birth, ID band Patient awake    Reviewed: Allergy & Precautions, NPO status , Patient's Chart, lab work & pertinent test results  Airway Mallampati: IV  TM Distance: >3 FB Neck ROM: Full    Dental  (+) Edentulous Upper, Edentulous Lower   Pulmonary sleep apnea (pt denies ever being diagnosed with this) , former smoker Quit smoking 2017, 83 pack year history    Pulmonary exam normal breath sounds clear to auscultation       Cardiovascular hypertension, Pt. on medications + Peripheral Vascular Disease  Normal cardiovascular exam Rhythm:Regular Rate:Normal     Neuro/Psych  Headaches PSYCHIATRIC DISORDERS Anxiety Depression       GI/Hepatic negative GI ROS, Neg liver ROS,,,  Endo/Other  diabetes (prediabetic)    Renal/GU negative Renal ROS  negative genitourinary   Musculoskeletal  (+) Arthritis , Osteoarthritis,    Abdominal   Peds  Hematology negative hematology ROS (+)   Anesthesia Other Findings   Reproductive/Obstetrics negative OB ROS                              Anesthesia Physical Anesthesia Plan  ASA: 3  Anesthesia Plan: General   Post-op Pain Management: Tylenol  PO (pre-op)*   Induction: Intravenous  PONV Risk Score and Plan: 3 and Ondansetron , Dexamethasone  and Treatment may vary due to age or medical condition  Airway Management Planned: Oral ETT  Additional Equipment: Arterial line  Intra-op Plan:   Post-operative Plan: Extubation in OR  Informed Consent: I have reviewed the patients History and Physical, chart, labs and discussed the procedure including the risks, benefits and alternatives for the proposed anesthesia with the patient or authorized representative who has indicated his/her understanding and acceptance.     Dental advisory given  Plan Discussed with: CRNA  Anesthesia Plan Comments: (PAT  note written 03/07/2024 by Tannar Broker, PA-C. Surgery rescheduled from 03/13/2024 to 03/20/2024.  Mr. Oakland is a 78 year old male scheduled for the above procedure.   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    He has stage IV prostrate cancer. AAA initially incidental finding on PET scan in April 2024. 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 Vascular surgery felt he met threshold for repair of both aneurysms. He had follow-up with oncologist Dr. Tina first, last visit on 8/25/025. Dr. Tina did feel like he was a candidate for AAA repair given functional status. He did recommend restaging PET scan in the future.  He also thought he may benefit from a suprapubic catheter in the future as urinary catheter was making him uncomfortable.    A1c 6.0%.  Creatinine 0.66.  Hemoglobin 12.9. PLT count 179K. )         Anesthesia Quick Evaluation

## 2024-03-07 NOTE — Progress Notes (Signed)
 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   He has stage IV prostrate cancer. AAA initially incidental finding on PET scan in April 2024. 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 Vascular surgery felt he met threshold for repair of both aneurysms. He had follow-up with oncologist Dr. Tina first, last visit on 8/25/025. Dr. Tina did feel like he was a candidate for AAA repair given functional status. He did recommend restaging PET scan in the future.  He also thought he may benefit from a suprapubic catheter in the future as urinary catheter was making him uncomfortable.   A1c 6.0%.  Creatinine 0.66.  Hemoglobin 12.9. PLT count 179K.  Anesthesia team to evaluate on the day of surgery.    VS: BP (!) 141/77   Pulse (!) 58   Temp 36.7 C   Resp 18   Ht 5' 9 (1.753 m)   Wt 98.4 kg   SpO2 97%   BMI 32.03 kg/m    PROVIDERS: Gil Greig BRAVO, NP is PCP  Pearline Norman, MD is vascular surgeon Tina Hudson, MD is DOUGLASS Borer, NP is Palliative Care provider  Carolee Sherwood MOULD, MD is urologist   LABS: Preoperative labs and 03/03/2024 results noted.  (all labs ordered  are listed, but only abnormal results are displayed)  Labs Reviewed  SURGICAL PCR SCREEN  APTT  PROTIME-INR  TYPE AND SCREEN   Lab Results  Component Value Date   WBC 4.1 03/03/2024   HGB 12.9 (L) 03/03/2024   HCT 38.5 (L) 03/03/2024   PLT 179 03/03/2024   GLUCOSE 148 (H) 03/03/2024   CHOL 139 11/08/2023   TRIG 153 (H) 11/08/2023   HDL 50 11/08/2023   LDLCALC 65 11/08/2023   ALT 13 03/03/2024   AST 16 03/03/2024   NA 139 03/03/2024   K 4.1 03/03/2024   CL 107 03/03/2024   CREATININE 0.66 03/03/2024   BUN 18 03/03/2024   CO2 26 03/03/2024   PSA 33.50 09/28/2022   INR 1.0 03/06/2024   HGBA1C 6.0 (H) 11/08/2023     IMAGES: CTA Abd/pelvis 01/28/2024: IMPRESSION: 1. Fusiform infrarenal abdominal aortic aneurysm estimated at 5.0 cm using 3D measurement tools. 2. Right common iliac artery aneurysm estimated at 4.2 cm. 3. Diffuse bony metastatic disease, likely progressed from October 30, 2022. 4. A small low-attenuation density is present in the caudate which is too small for definitive characterization and of uncertain significance. Recommend attention to be directed to this area on follow-up imaging.   PET Scan 10/30/2022: IMPRESSION: 1. There is diffuse radiotracer uptake throughout the prostate gland compatible with residual/recurrent prostate cancer. 2. Bilateral tracer avid pelvic lymph nodes compatible with nodal metastasis. 3. Multifocal  tracer avid bone metastases. 4. Solitary tracer avid pulmonary nodule within the right upper lobe is identified. 5. 5.2 cm infrarenal abdominal aortic aneurysm. Recommend follow-up every 6 months and vascular consultation. This recommendation follows ACR consensus guidelines: White Paper of the ACR Incidental Findings Committee II on Vascular Findings. J Am Coll Radiol 2013; 10:789-794. Aortic aneurysm NOS (ICD10-I71.9). 6.  Aortic Atherosclerosis (ICD10-I70.0).   EKG: 03/06/2024: Sinus rhythm with Premature atrial  complexes with Abberant conduction Otherwise normal ECG When compared with ECG of 22-Aug-2022 09:13, No significant change since last tracing Confirmed by Croitoru, Mihai [phone]) on 03/06/2024 9:19:05 PM   CV: N/A  Past Medical History:  Diagnosis Date   AAA (abdominal aortic aneurysm) (HCC)    Allergic rhinitis due to pollen    Anxiety state, unspecified    Asymptomatic varicose veins    Atherosclerosis of native arteries of the extremities, unspecified    Cataract 10/17/2016   L Eye   Contact dermatitis and other eczema due to other chemical products    Diaphragmatic hernia without mention of obstruction or gangrene    Elevated PSA    Flatulence, eructation, and gas pain    Headache(784.0)    Hemorrhoids, external    Herpes zoster with unspecified complication    Hyperpotassemia    Insomnia, unspecified    Lumbago    Lumbago    Myalgia and myositis, unspecified    Nocturia    Obesity, unspecified    Osteoarthrosis, unspecified whether generalized or localized, unspecified site    Other abnormal blood chemistry    Other headache syndromes(339.89)    Pre-diabetes    Pure hyperglyceridemia    Rash and other nonspecific skin eruption    Rotator cuff (capsule) sprain    Routine general medical examination at a health care facility    Tobacco use disorder    Unspecified essential hypertension    Unspecified hypertrophic and atrophic condition of skin    Unspecified sleep apnea     Past Surgical History:  Procedure Laterality Date   CYSTOSCOPY N/A 08/22/2022   Procedure: CYSTOSCOPY;  Surgeon: Rosalind Zachary NOVAK, MD;  Location[facility];  Service: Urology;  Laterality: N/A;   DENTAL SURGERY  05/10/2021   Per patient   HEMORRHOID SURGERY     PROSTATE BIOPSY     TRANSURETHRAL RESECTION OF PROSTATE N/A 08/22/2022   Procedure: TRANSURETHRAL RESECTION OF THE PROSTATE (TURP);  Surgeon: Rosalind Zachary NOVAK, MD;  Location: [facility];  Service:  Urology;  Laterality: N/A;   tumorectomy Left    Removed tumor from his chest.   VARICOSE VEIN SURGERY      MEDICATIONS:  acetaminophen  (TYLENOL ) 325 MG tablet   calcium -vitamin D (OSCAL WITH D) 500-5 MG-MCG tablet   cetirizine  (ZYRTEC ) 10 MG tablet   Cholecalciferol (VITAMIN D3) 25 MCG (1000 UT) CAPS   darolutamide  (NUBEQA ) 300 MG tablet   finasteride  (PROSCAR ) 5 MG tablet   losartan  (COZAAR ) 100 MG tablet   silodosin  (RAPAFLO ) 8 MG CAPS capsule   simvastatin  (ZOCOR ) 20 MG tablet   traZODone  (DESYREL ) 150 MG tablet   triamcinolone  cream (KENALOG ) 0.1 %   No current facility-administered medications for this encounter.    David Ruder, PA-C Surgical Short Stay/Anesthesiology [facility] Phone [phone] [facility] Phone [phone] 03/07/2024 7:53 PM

## 2024-03-11 ENCOUNTER — Telehealth: Payer: Self-pay

## 2024-03-11 ENCOUNTER — Other Ambulatory Visit: Payer: Self-pay | Admitting: *Deleted

## 2024-03-11 NOTE — Telephone Encounter (Signed)
 I attempted to inform David [name] of his upcoming re-scheduled appointment, his line was busy.

## 2024-03-14 ENCOUNTER — Encounter (HOSPITAL_COMMUNITY)
Admission: RE | Admit: 2024-03-14 | Discharge: 2024-03-14 | Disposition: A | Discharge status: Home / Self Care | Source: Ambulatory Visit

## 2024-03-14 DIAGNOSIS — C7951 Secondary malignant neoplasm of bone: Secondary | ICD-10-CM | POA: Insufficient documentation

## 2024-03-14 DIAGNOSIS — C61 Malignant neoplasm of prostate: Secondary | ICD-10-CM | POA: Diagnosis not present

## 2024-03-14 MED ORDER — FLOTUFOLASTAT F 18 GALLIUM 296-5846 MBQ/ML IV SOLN
8.3100 | Freq: Once | INTRAVENOUS | Status: AC
  Administered 2024-03-14: 8.31 via INTRAVENOUS

## 2024-03-17 ENCOUNTER — Other Ambulatory Visit: Payer: Self-pay | Admitting: Orthopedic Surgery

## 2024-03-17 ENCOUNTER — Telehealth: Payer: Self-pay | Admitting: Nurse Practitioner

## 2024-03-17 DIAGNOSIS — E781 Pure hyperglyceridemia: Secondary | ICD-10-CM

## 2024-03-17 NOTE — Telephone Encounter (Signed)
 Scheduled patient for palliative appointment, left the patient a voicemail with appointment details.

## 2024-03-19 NOTE — H&P (Shared)
 SDW, called to inform patient of arrival date and time of 03/20/2024 at 0530. NPO continues. Follow for hygiene and medications instructions from PAT appointment.

## 2024-03-20 ENCOUNTER — Inpatient Hospital Stay (HOSPITAL_COMMUNITY): Payer: Self-pay | Admitting: Vascular Surgery

## 2024-03-20 ENCOUNTER — Encounter (HOSPITAL_COMMUNITY): Payer: Self-pay | Admitting: Vascular Surgery

## 2024-03-20 ENCOUNTER — Inpatient Hospital Stay (HOSPITAL_COMMUNITY)

## 2024-03-20 ENCOUNTER — Inpatient Hospital Stay (HOSPITAL_COMMUNITY)
Admission: RE | Admit: 2024-03-20 | Discharge: 2024-03-21 | DRG: 269 | Disposition: A | Discharge status: Home / Self Care | Attending: Vascular Surgery | Admitting: Vascular Surgery

## 2024-03-20 ENCOUNTER — Other Ambulatory Visit: Payer: Self-pay

## 2024-03-20 ENCOUNTER — Encounter (HOSPITAL_COMMUNITY)
Admission: RE | Disposition: A | Discharge status: Home / Self Care | Payer: Self-pay | Source: Home / Self Care | Attending: Vascular Surgery

## 2024-03-20 DIAGNOSIS — Z7982 Long term (current) use of aspirin: Secondary | ICD-10-CM | POA: Diagnosis not present

## 2024-03-20 DIAGNOSIS — I10 Essential (primary) hypertension: Secondary | ICD-10-CM

## 2024-03-20 DIAGNOSIS — Z880 Allergy status to penicillin: Secondary | ICD-10-CM

## 2024-03-20 DIAGNOSIS — Z8546 Personal history of malignant neoplasm of prostate: Secondary | ICD-10-CM | POA: Diagnosis not present

## 2024-03-20 DIAGNOSIS — F418 Other specified anxiety disorders: Secondary | ICD-10-CM | POA: Diagnosis not present

## 2024-03-20 DIAGNOSIS — Z7902 Long term (current) use of antithrombotics/antiplatelets: Secondary | ICD-10-CM | POA: Diagnosis not present

## 2024-03-20 DIAGNOSIS — Z888 Allergy status to other drugs, medicaments and biological substances status: Secondary | ICD-10-CM

## 2024-03-20 DIAGNOSIS — Z792 Long term (current) use of antibiotics: Secondary | ICD-10-CM

## 2024-03-20 DIAGNOSIS — I723 Aneurysm of iliac artery: Secondary | ICD-10-CM | POA: Diagnosis not present

## 2024-03-20 DIAGNOSIS — Z8249 Family history of ischemic heart disease and other diseases of the circulatory system: Secondary | ICD-10-CM

## 2024-03-20 DIAGNOSIS — J301 Allergic rhinitis due to pollen: Secondary | ICD-10-CM | POA: Diagnosis present

## 2024-03-20 DIAGNOSIS — G4730 Sleep apnea, unspecified: Secondary | ICD-10-CM | POA: Diagnosis not present

## 2024-03-20 DIAGNOSIS — R0989 Other specified symptoms and signs involving the circulatory and respiratory systems: Secondary | ICD-10-CM | POA: Diagnosis not present

## 2024-03-20 DIAGNOSIS — Z885 Allergy status to narcotic agent status: Secondary | ICD-10-CM

## 2024-03-20 DIAGNOSIS — E669 Obesity, unspecified: Secondary | ICD-10-CM | POA: Diagnosis present

## 2024-03-20 DIAGNOSIS — E781 Pure hyperglyceridemia: Secondary | ICD-10-CM | POA: Diagnosis present

## 2024-03-20 DIAGNOSIS — I714 Abdominal aortic aneurysm, without rupture, unspecified: Principal | ICD-10-CM

## 2024-03-20 DIAGNOSIS — Z833 Family history of diabetes mellitus: Secondary | ICD-10-CM

## 2024-03-20 DIAGNOSIS — I7143 Infrarenal abdominal aortic aneurysm, without rupture: Secondary | ICD-10-CM | POA: Diagnosis present

## 2024-03-20 DIAGNOSIS — Z9221 Personal history of antineoplastic chemotherapy: Secondary | ICD-10-CM

## 2024-03-20 DIAGNOSIS — Z923 Personal history of irradiation: Secondary | ICD-10-CM

## 2024-03-20 DIAGNOSIS — I7142 Juxtarenal abdominal aortic aneurysm, without rupture: Secondary | ICD-10-CM | POA: Diagnosis present

## 2024-03-20 DIAGNOSIS — Z6831 Body mass index (BMI) 31.0-31.9, adult: Secondary | ICD-10-CM

## 2024-03-20 DIAGNOSIS — Z87891 Personal history of nicotine dependence: Secondary | ICD-10-CM | POA: Diagnosis not present

## 2024-03-20 DIAGNOSIS — Z9079 Acquired absence of other genital organ(s): Secondary | ICD-10-CM

## 2024-03-20 DIAGNOSIS — I700 Atherosclerosis of aorta: Secondary | ICD-10-CM | POA: Diagnosis not present

## 2024-03-20 DIAGNOSIS — Z8679 Personal history of other diseases of the circulatory system: Principal | ICD-10-CM

## 2024-03-20 DIAGNOSIS — Z79899 Other long term (current) drug therapy: Secondary | ICD-10-CM | POA: Diagnosis not present

## 2024-03-20 DIAGNOSIS — I7121 Aneurysm of the ascending aorta, without rupture: Secondary | ICD-10-CM | POA: Diagnosis not present

## 2024-03-20 DIAGNOSIS — Z881 Allergy status to other antibiotic agents status: Secondary | ICD-10-CM | POA: Diagnosis not present

## 2024-03-20 DIAGNOSIS — I70203 Unspecified atherosclerosis of native arteries of extremities, bilateral legs: Secondary | ICD-10-CM | POA: Diagnosis present

## 2024-03-20 DIAGNOSIS — R918 Other nonspecific abnormal finding of lung field: Secondary | ICD-10-CM | POA: Diagnosis not present

## 2024-03-20 HISTORY — PX: ABDOMINAL AORTIC ENDOVASCULAR FENESTRATED STENT GRAFT: SHX6430

## 2024-03-20 HISTORY — PX: INSERTION OF ILIAC STENT: SHX6256

## 2024-03-20 LAB — BASIC METABOLIC PANEL WITH GFR
Anion gap: 7 (ref 5–15)
BUN: 9 mg/dL (ref 8–23)
CO2: 24 mmol/L (ref 22–32)
Calcium: 8 mg/dL — ABNORMAL LOW (ref 8.9–10.3)
Chloride: 104 mmol/L (ref 98–111)
Creatinine, Ser: 0.57 mg/dL — ABNORMAL LOW (ref 0.61–1.24)
GFR, Estimated: >60 mL/min (ref >60)
Glucose, Bld: 146 mg/dL — ABNORMAL HIGH (ref 70–99)
Potassium: 4.3 mmol/L (ref 3.5–5.1)
Sodium: 135 mmol/L (ref 135–145)

## 2024-03-20 LAB — CBC
HCT: 31.6 % — ABNORMAL LOW (ref 39.0–52.0)
Hemoglobin: 10.7 g/dL — ABNORMAL LOW (ref 13.0–17.0)
MCH: 33.3 pg (ref 26.0–34.0)
MCHC: 33.9 g/dL (ref 30.0–36.0)
MCV: 98.4 fL (ref 80.0–100.0)
Platelets: 129 K/uL — ABNORMAL LOW (ref 150–400)
RBC: 3.21 MIL/uL — ABNORMAL LOW (ref 4.22–5.81)
RDW: 13.3 % (ref 11.5–15.5)
WBC: 6.2 K/uL (ref 4.0–10.5)
nRBC: 0 % (ref 0.0–0.2)

## 2024-03-20 LAB — APTT: aPTT: 34 s (ref 24–36)

## 2024-03-20 LAB — MAGNESIUM: Magnesium: 1.6 mg/dL — ABNORMAL LOW (ref 1.7–2.4)

## 2024-03-20 LAB — PROTIME-INR
INR: 1.1 (ref 0.8–1.2)
Prothrombin Time: 15.3 s — ABNORMAL HIGH (ref 11.4–15.2)

## 2024-03-20 LAB — ABO/RH: ABO/RH(D): A NEG

## 2024-03-20 SURGERY — ABDOMINAL AORTIC ENDOVASCULAR FENESTRATED STENT GRAFT
Anesthesia: General | Site: Groin | Laterality: Bilateral

## 2024-03-20 MED ORDER — HEPARIN SODIUM (PORCINE) 1000 UNIT/ML IJ SOLN
INTRAMUSCULAR | Status: AC
  Filled 2024-03-20: qty 10

## 2024-03-20 MED ORDER — PROTAMINE SULFATE 10 MG/ML IV SOLN
INTRAVENOUS | Status: AC
Start: 2024-03-20 — End: 2024-03-20
  Filled 2024-03-20: qty 5

## 2024-03-20 MED ORDER — OXYCODONE HCL 5 MG PO TABS: 5.0000 mg | ORAL_TABLET | Freq: Once | ORAL | Status: DC | PRN

## 2024-03-20 MED ORDER — LIDOCAINE 2% (20 MG/ML) 5 ML SYRINGE
INTRAMUSCULAR | Status: DC | PRN
  Administered 2024-03-20: 100 mg via INTRAVENOUS

## 2024-03-20 MED ORDER — ONDANSETRON HCL 4 MG/2ML IJ SOLN
INTRAMUSCULAR | Status: AC
Start: 2024-03-20 — End: 2024-03-20
  Filled 2024-03-20: qty 2

## 2024-03-20 MED ORDER — FENTANYL CITRATE (PF) 250 MCG/5ML IJ SOLN
INTRAMUSCULAR | Status: AC
  Filled 2024-03-20: qty 5

## 2024-03-20 MED ORDER — HEPARIN 6000 UNIT IRRIGATION SOLUTION
Status: DC | PRN
  Administered 2024-03-20 (×3): 1

## 2024-03-20 MED ORDER — PROPOFOL 10 MG/ML IV BOLUS
INTRAVENOUS | Status: AC
  Filled 2024-03-20: qty 20

## 2024-03-20 MED ORDER — LABETALOL HCL 5 MG/ML IV SOLN
INTRAVENOUS | Status: AC
  Filled 2024-03-20: qty 4

## 2024-03-20 MED ORDER — PHENYLEPHRINE HCL-NACL 20-0.9 MG/250ML-% IV SOLN
INTRAVENOUS | Status: DC | PRN
  Administered 2024-03-20: 30 ug/min via INTRAVENOUS

## 2024-03-20 MED ORDER — PHENYLEPHRINE 80 MCG/ML (10ML) SYRINGE FOR IV PUSH (FOR BLOOD PRESSURE SUPPORT)
PREFILLED_SYRINGE | INTRAVENOUS | Status: DC | PRN
  Administered 2024-03-20: 160 ug via INTRAVENOUS
  Administered 2024-03-20: 80 ug via INTRAVENOUS
  Administered 2024-03-20 (×2): 160 ug via INTRAVENOUS

## 2024-03-20 MED ORDER — DEXMEDETOMIDINE HCL IN NACL 80 MCG/20ML IV SOLN
INTRAVENOUS | Status: DC | PRN
Start: 2024-03-20 — End: 2024-03-20
  Administered 2024-03-20: 10 ug via INTRAVENOUS

## 2024-03-20 MED ORDER — LORAZEPAM 0.5 MG PO TABS: 0.5000 mg | ORAL_TABLET | Freq: Every day | ORAL | Status: DC | PRN

## 2024-03-20 MED ORDER — SODIUM CHLORIDE 0.9 % IV SOLN
INTRAVENOUS | Status: AC
Start: 2024-03-20 — End: 2024-03-21

## 2024-03-20 MED ORDER — HYDROMORPHONE HCL 1 MG/ML IJ SOLN
INTRAMUSCULAR | Status: DC | PRN
  Administered 2024-03-20: .5 mg via INTRAVENOUS

## 2024-03-20 MED ORDER — PANTOPRAZOLE SODIUM 40 MG PO TBEC
40.0000 mg | DELAYED_RELEASE_TABLET | Freq: Every day | ORAL | Status: DC
  Administered 2024-03-20 – 2024-03-21 (×2): 40 mg via ORAL
  Filled 2024-03-20 (×2): qty 1

## 2024-03-20 MED ORDER — SIMVASTATIN 20 MG PO TABS
20.0000 mg | ORAL_TABLET | Freq: Every day | ORAL | Status: DC
  Administered 2024-03-20: 20 mg via ORAL
  Filled 2024-03-20 (×2): qty 1

## 2024-03-20 MED ORDER — LOSARTAN POTASSIUM 50 MG PO TABS: 100.0000 mg | ORAL_TABLET | Freq: Every day | ORAL | Status: DC

## 2024-03-20 MED ORDER — HEPARIN 6000 UNIT IRRIGATION SOLUTION
Status: AC
  Filled 2024-03-20: qty 500

## 2024-03-20 MED ORDER — LACTATED RINGERS IV SOLN: INTRAVENOUS | Status: DC | PRN

## 2024-03-20 MED ORDER — PHENOL 1.4 % MT LIQD: 1.0000 | OROMUCOSAL | Status: DC | PRN

## 2024-03-20 MED ORDER — IODIXANOL 320 MG/ML IV SOLN
INTRAVENOUS | Status: DC | PRN
  Administered 2024-03-20: 220 mL via INTRA_ARTERIAL

## 2024-03-20 MED ORDER — HEPARIN SODIUM (PORCINE) 1000 UNIT/ML IJ SOLN
INTRAMUSCULAR | Status: DC | PRN
  Administered 2024-03-20 (×3): 2000 [IU] via INTRAVENOUS
  Administered 2024-03-20: 3000 [IU] via INTRAVENOUS
  Administered 2024-03-20: 10000 [IU] via INTRAVENOUS

## 2024-03-20 MED ORDER — HEPARIN SODIUM (PORCINE) 5000 UNIT/ML IJ SOLN
5000.0000 [IU] | Freq: Three times a day (TID) | INTRAMUSCULAR | Status: DC
  Administered 2024-03-21: 5000 [IU] via SUBCUTANEOUS
  Filled 2024-03-20: qty 1

## 2024-03-20 MED ORDER — LIDOCAINE 2% (20 MG/ML) 5 ML SYRINGE
INTRAMUSCULAR | Status: AC
Start: 2024-03-20 — End: 2024-03-20
  Filled 2024-03-20: qty 5

## 2024-03-20 MED ORDER — ALBUMIN HUMAN 5 % IV SOLN: INTRAVENOUS | Status: DC | PRN

## 2024-03-20 MED ORDER — PROTAMINE SULFATE 10 MG/ML IV SOLN
INTRAVENOUS | Status: AC
  Filled 2024-03-20: qty 5

## 2024-03-20 MED ORDER — FINASTERIDE 5 MG PO TABS
5.0000 mg | ORAL_TABLET | Freq: Every day | ORAL | Status: DC
  Administered 2024-03-20 – 2024-03-21 (×2): 5 mg via ORAL
  Filled 2024-03-20 (×2): qty 1

## 2024-03-20 MED ORDER — DROPERIDOL 2.5 MG/ML IJ SOLN
0.6250 mg | Freq: Once | INTRAMUSCULAR | Status: AC | PRN
  Administered 2024-03-20: 0.625 mg via INTRAVENOUS

## 2024-03-20 MED ORDER — ROCURONIUM BROMIDE 10 MG/ML (PF) SYRINGE
PREFILLED_SYRINGE | INTRAVENOUS | Status: DC | PRN
  Administered 2024-03-20: 20 mg via INTRAVENOUS
  Administered 2024-03-20: 70 mg via INTRAVENOUS
  Administered 2024-03-20: 30 mg via INTRAVENOUS
  Administered 2024-03-20 (×3): 20 mg via INTRAVENOUS

## 2024-03-20 MED ORDER — ACETAMINOPHEN 650 MG RE SUPP: 325.0000 mg | RECTAL | Status: DC | PRN

## 2024-03-20 MED ORDER — SODIUM CHLORIDE 0.9 % IV SOLN: INTRAVENOUS | Status: DC

## 2024-03-20 MED ORDER — SUGAMMADEX SODIUM 200 MG/2ML IV SOLN
INTRAVENOUS | Status: DC | PRN
  Administered 2024-03-20: 200 mg via INTRAVENOUS

## 2024-03-20 MED ORDER — DEXMEDETOMIDINE HCL IN NACL 80 MCG/20ML IV SOLN
INTRAVENOUS | Status: AC
Start: 2024-03-20 — End: 2024-03-20
  Filled 2024-03-20: qty 20

## 2024-03-20 MED ORDER — ROCURONIUM BROMIDE 10 MG/ML (PF) SYRINGE
PREFILLED_SYRINGE | INTRAVENOUS | Status: AC
  Filled 2024-03-20: qty 10

## 2024-03-20 MED ORDER — DROPERIDOL 2.5 MG/ML IJ SOLN
INTRAMUSCULAR | Status: AC
  Filled 2024-03-20: qty 2

## 2024-03-20 MED ORDER — POLYETHYLENE GLYCOL 3350 17 G PO PACK: 17.0000 g | PACK | Freq: Every day | ORAL | Status: DC | PRN

## 2024-03-20 MED ORDER — PROTAMINE SULFATE 10 MG/ML IV SOLN
INTRAVENOUS | Status: DC | PRN
  Administered 2024-03-20 (×3): 20 mg via INTRAVENOUS

## 2024-03-20 MED ORDER — DAROLUTAMIDE 300 MG PO TABS: 600.0000 mg | ORAL_TABLET | Freq: Two times a day (BID) | ORAL | Status: DC

## 2024-03-20 MED ORDER — FENTANYL CITRATE (PF) 250 MCG/5ML IJ SOLN
INTRAMUSCULAR | Status: DC | PRN
  Administered 2024-03-20: 100 ug via INTRAVENOUS
  Administered 2024-03-20 (×3): 50 ug via INTRAVENOUS

## 2024-03-20 MED ORDER — HYDROCODONE-ACETAMINOPHEN 5-325 MG PO TABS
1.0000 | ORAL_TABLET | ORAL | Status: DC | PRN
  Administered 2024-03-20 – 2024-03-21 (×2): 1 via ORAL
  Filled 2024-03-20 (×2): qty 1

## 2024-03-20 MED ORDER — ACETAMINOPHEN 325 MG PO TABS: 325.0000 mg | ORAL_TABLET | ORAL | Status: DC | PRN

## 2024-03-20 MED ORDER — CHLORHEXIDINE GLUCONATE 0.12 % MT SOLN
15.0000 mL | OROMUCOSAL | Status: AC
  Administered 2024-03-20: 15 mL via OROMUCOSAL
  Filled 2024-03-20: qty 15

## 2024-03-20 MED ORDER — DOCUSATE SODIUM 100 MG PO CAPS
100.0000 mg | ORAL_CAPSULE | Freq: Every day | ORAL | Status: DC
  Administered 2024-03-21: 100 mg via ORAL
  Filled 2024-03-20: qty 1

## 2024-03-20 MED ORDER — MORPHINE SULFATE (PF) 2 MG/ML IV SOLN: 2.0000 mg | INTRAVENOUS | Status: DC | PRN

## 2024-03-20 MED ORDER — CLOPIDOGREL BISULFATE 75 MG PO TABS
75.0000 mg | ORAL_TABLET | Freq: Every day | ORAL | Status: DC
  Administered 2024-03-21: 75 mg via ORAL
  Filled 2024-03-20: qty 1

## 2024-03-20 MED ORDER — ACETAMINOPHEN 10 MG/ML IV SOLN: 1000.0000 mg | Freq: Once | INTRAVENOUS | Status: DC | PRN

## 2024-03-20 MED ORDER — CHLORHEXIDINE GLUCONATE CLOTH 2 % EX PADS
6.0000 | MEDICATED_PAD | Freq: Once | CUTANEOUS | Status: DC
Start: 2024-03-20 — End: 2024-03-20

## 2024-03-20 MED ORDER — HYDROMORPHONE HCL 1 MG/ML IJ SOLN
INTRAMUSCULAR | Status: AC
  Filled 2024-03-20: qty 0.5

## 2024-03-20 MED ORDER — FENTANYL CITRATE (PF) 100 MCG/2ML IJ SOLN: 25.0000 ug | INTRAMUSCULAR | Status: DC | PRN

## 2024-03-20 MED ORDER — CIPROFLOXACIN IN D5W 400 MG/200ML IV SOLN
400.0000 mg | INTRAVENOUS | Status: AC
Start: 2024-03-20 — End: 2024-03-20
  Administered 2024-03-20: 400 mg via INTRAVENOUS
  Filled 2024-03-20: qty 200

## 2024-03-20 MED ORDER — METOPROLOL TARTRATE 5 MG/5ML IV SOLN: 2.5000 mg | INTRAVENOUS | Status: DC | PRN

## 2024-03-20 MED ORDER — LABETALOL HCL 5 MG/ML IV SOLN
INTRAVENOUS | Status: DC | PRN
  Administered 2024-03-20 (×2): 5 mg via INTRAVENOUS

## 2024-03-20 MED ORDER — ASPIRIN 81 MG PO TBEC
81.0000 mg | DELAYED_RELEASE_TABLET | Freq: Every day | ORAL | Status: DC
  Administered 2024-03-21: 81 mg via ORAL
  Filled 2024-03-20: qty 1

## 2024-03-20 MED ORDER — ONDANSETRON HCL 4 MG/2ML IJ SOLN
INTRAMUSCULAR | Status: DC | PRN
Start: 2024-03-20 — End: 2024-03-20
  Administered 2024-03-20: 4 mg via INTRAVENOUS

## 2024-03-20 MED ORDER — TRAZODONE HCL 50 MG PO TABS
150.0000 mg | ORAL_TABLET | Freq: Every day | ORAL | Status: DC
  Administered 2024-03-20: 150 mg via ORAL
  Filled 2024-03-20: qty 1

## 2024-03-20 MED ORDER — OXYCODONE HCL 5 MG/5ML PO SOLN: 5.0000 mg | Freq: Once | ORAL | Status: DC | PRN

## 2024-03-20 MED ORDER — CHLORHEXIDINE GLUCONATE CLOTH 2 % EX PADS: 6.0000 | MEDICATED_PAD | Freq: Once | CUTANEOUS | Status: DC

## 2024-03-20 MED ORDER — PROPOFOL 10 MG/ML IV BOLUS
INTRAVENOUS | Status: DC | PRN
  Administered 2024-03-20: 100 mg via INTRAVENOUS
  Administered 2024-03-20: 50 mg via INTRAVENOUS

## 2024-03-20 MED ORDER — ONDANSETRON HCL 4 MG/2ML IJ SOLN
INTRAMUSCULAR | Status: AC
  Filled 2024-03-20: qty 2

## 2024-03-20 MED ORDER — POTASSIUM CHLORIDE CRYS ER 20 MEQ PO TBCR: 40.0000 meq | EXTENDED_RELEASE_TABLET | Freq: Every day | ORAL | Status: DC | PRN

## 2024-03-20 MED ORDER — EPHEDRINE SULFATE-NACL 50-0.9 MG/10ML-% IV SOSY
PREFILLED_SYRINGE | INTRAVENOUS | Status: DC | PRN
Start: 2024-03-20 — End: 2024-03-20
  Administered 2024-03-20: 10 mg via INTRAVENOUS

## 2024-03-20 SURGICAL SUPPLY — 73 items
BAG COUNTER SPONGE SURGICOUNT (BAG) ×3 IMPLANT
BAG DECANTER FOR FLEXI CONT (MISCELLANEOUS) IMPLANT
BALLOON ATLAS 14X40X75 (BALLOONS) IMPLANT
BALLOON COD OCL 2-10-35-140-46 (BALLOONS) IMPLANT
BALLOON COD OCL 2-9.0-35-120-3 (BALLOONS) IMPLANT
BALLOON MUSTANG 8X20X75 (BALLOONS) IMPLANT
BALLOON MUSTANG 9X20X75 (BALLOONS) IMPLANT
CANISTER SUCTION 3000ML PPV (SUCTIONS) ×3 IMPLANT
CATH BALLOON XXL 14X20X120 (BALLOONS) IMPLANT
CATH BEACON 5 .035 65 KMP TIP (CATHETERS) IMPLANT
CATH OMNI FLUSH .035X70CM (CATHETERS) IMPLANT
CATH QUICKCROSS SUPP .035X90CM (MICROCATHETER) IMPLANT
CATH VANCHIE3 BT 5F .035X125 (CATHETERS) IMPLANT
CLIP TI MEDIUM 6 (CLIP) ×3 IMPLANT
CLIP TI WIDE RED SMALL 6 (CLIP) ×3 IMPLANT
CLOSURE PERCLOSE PROSTYLE (VASCULAR PRODUCTS) IMPLANT
DERMABOND ADVANCED .7 DNX12 (GAUZE/BANDAGES/DRESSINGS) ×3 IMPLANT
DEVICE CLOSURE PERCLS PRGLD 6F (VASCULAR PRODUCTS) IMPLANT
DEVICE RETRIEVAL W/8FR 55X40 (MISCELLANEOUS) IMPLANT
DEVICE TORQUE H2O (MISCELLANEOUS) IMPLANT
DEVICE TORQUE KENDALL .025-038 (MISCELLANEOUS) IMPLANT
DRAPE ZERO GRAVITY STERILE (DRAPES) ×3 IMPLANT
DRSG TEGADERM 2-3/8X2-3/4 SM (GAUZE/BANDAGES/DRESSINGS) ×6 IMPLANT
ELECTRODE REM PT RTRN 9FT ADLT (ELECTROSURGICAL) ×6 IMPLANT
EXCLUDER EXT ENDO 26X4.5 15F (Endovascular Graft) IMPLANT
GAUZE SPONGE 2X2 8PLY STRL LF (GAUZE/BANDAGES/DRESSINGS) ×6 IMPLANT
GLIDEWIRE ANGLED NITR .018X260 (WIRE) IMPLANT
GLOVE BIOGEL PI IND STRL 7.0 (GLOVE) ×3 IMPLANT
GOWN STRL REUS W/ TWL LRG LVL3 (GOWN DISPOSABLE) ×6 IMPLANT
GOWN STRL REUS W/ TWL XL LVL3 (GOWN DISPOSABLE) ×6 IMPLANT
GRAFT ENDO DISTAL 12X45 (Graft) IMPLANT
GRAFT FEN DIST EVAR 12X28X76 (Graft) IMPLANT
GRAFT FEN PROX EVAR 34X122M (Graft) IMPLANT
GRAFT LEG ILIAC ZSLE-16-56-ZT (Endovascular Graft) IMPLANT
GRAFT LEG ILIAC ZSLE-24-56-ZT (Endovascular Graft) IMPLANT
GUIDEWIRE ANGLED .035X260CM (WIRE) IMPLANT
KIT BASIN OR (CUSTOM PROCEDURE TRAY) ×3 IMPLANT
KIT ENCORE 26 ADVANTAGE (KITS) ×3 IMPLANT
KIT TURNOVER KIT B (KITS) ×3 IMPLANT
NDL PERC 18GX7CM (NEEDLE) IMPLANT
NEEDLE PERC 18GX7CM (NEEDLE) IMPLANT
NS IRRIG 1000ML POUR BTL (IV SOLUTION) ×3 IMPLANT
PACK ENDOVASCULAR (PACKS) ×3 IMPLANT
PAD ARMBOARD POSITIONER FOAM (MISCELLANEOUS) ×6 IMPLANT
POWDER SURGICEL 3.0 GRAM (HEMOSTASIS) IMPLANT
SEALANT HEMOST PREVELEAK 4ML (HEMOSTASIS) IMPLANT
SET MICROPUNCTURE 5F STIFF (MISCELLANEOUS) ×3 IMPLANT
SHEATH 12FRX45 (SHEATH) IMPLANT
SHEATH APTUS 7.0FR 180D 55 (SHEATH) IMPLANT
SHEATH BRITE TIP 8FR 23CM (SHEATH) IMPLANT
SHEATH BRITE TIP 8FR 35CM (SHEATH) ×3 IMPLANT
SHEATH DRYSEAL FLEX 20FR 33CM (SHEATH) IMPLANT
SHEATH FLEX ANSEL ANG 6F 45CM (SHEATH) IMPLANT
SHEATH FLEXOR INTRODUCER 8FR (SHEATH) IMPLANT
SHEATH GUIDING 7F 55X73X9MM TD (SHEATH) IMPLANT
SHEATH HIGHFLEX ANSEL 7FR 55CM (SHEATH) ×3 IMPLANT
SHEATH PINNACLE 8F 10CM (SHEATH) ×3 IMPLANT
STENT VIABAHN 11X59X135 (Permanent Stent) IMPLANT
STENT VIABAHN 6X19 6FR 80 (Permanent Stent) IMPLANT
STENT VIABAHN 7X19 6FR 80 (Permanent Stent) IMPLANT
STENT VIABAHN VBX 11X39X135 (Permanent Stent) IMPLANT
STOPCOCK MORSE 400PSI 3WAY (MISCELLANEOUS) ×3 IMPLANT
SUT MNCRL AB 4-0 PS2 18 (SUTURE) ×6 IMPLANT
SUT PROLENE 5 0 C 1 24 (SUTURE) IMPLANT
SUT VIC AB 2-0 CT1 TAPERPNT 27 (SUTURE) IMPLANT
SUT VIC AB 3-0 SH 27X BRD (SUTURE) IMPLANT
SYR 30ML LL (SYRINGE) IMPLANT
TOWEL GREEN STERILE (TOWEL DISPOSABLE) ×3 IMPLANT
TRAY FOLEY MTR SLVR 16FR STAT (SET/KITS/TRAYS/PACK) ×3 IMPLANT
TUBING INJECTOR 48 (MISCELLANEOUS) ×3 IMPLANT
WIRE BENTSON .035X145CM (WIRE) ×6 IMPLANT
WIRE ROSEN-J .035X260CM (WIRE) ×6 IMPLANT
WIRE STIFF LUNDERQUIST 260MM (WIRE) ×6 IMPLANT

## 2024-03-20 NOTE — Anesthesia Procedure Notes (Signed)
 Procedure Name: Intubation Date/Time: 03/20/2024 7:46 AM  Performed by: Marva Lonni PARAS, CRNAPre-anesthesia Checklist: Patient identified, Emergency Drugs available, Suction available and Patient being monitored Patient Re-evaluated:Patient Re-evaluated prior to induction Oxygen Delivery Method: Circle System Utilized Preoxygenation: Pre-oxygenation with 100% oxygen Induction Type: IV induction Ventilation: Mask ventilation without difficulty Laryngoscope Size: Mac and 4 Grade View: Grade I Tube type: Oral Tube size: 7.5 mm Number of attempts: 1 Airway Equipment and Method: Stylet Placement Confirmation: ETT inserted through vocal cords under direct vision, positive ETCO2 and breath sounds checked- equal and bilateral Secured at: 23 cm Tube secured with: Tape Dental Injury: Teeth and Oropharynx as per pre-operative assessment

## 2024-03-20 NOTE — H&P (Signed)
 Patient seen and examined.  No complaints. No changes to medication history or physical exam since last seen. After discussing the risks and benefits of Fenestrated endovascular aortic repair, David JONELLE Cordella Mickey. elected to proceed.   Norman GORMAN Serve MD      Patient ID: David JONELLE Cordella Mickey., male   DOB: 09/20/1945, 78 y.o.   MRN: [id number]   Reason for Consult: Follow-up   Referred by Gil Greig BRAVO, NP   Subjective:    Subjective HPI   David Repetto. is a 78 y.o. male presenting for follow-up of infrarenal AAA.  He denies any new or unusual abdominal or back pain.  He is overall doing well.  He reports that he is finished chemo and radiation therapy for his prostate cancer.  He says he does have times of fatigue but overall can get around okay.  He he does the food shopping as his wife has dementia but reports he usually buys some things for breakfast as they go out to eat for other meals every day.       Past Medical History:  Diagnosis Date   AAA (abdominal aortic aneurysm) (HCC)     Allergic rhinitis due to pollen     Anxiety state, unspecified     Asymptomatic varicose veins     Atherosclerosis of native arteries of the extremities, unspecified     Cataract 10/17/2016    L Eye   Contact dermatitis and other eczema due to other chemical products     Diaphragmatic hernia without mention of obstruction or gangrene     Elevated PSA     Flatulence, eructation, and gas pain     Headache(784.0)     Hemorrhoids, external     Herpes zoster with unspecified complication     Hyperpotassemia     Insomnia, unspecified     Lumbago     Lumbago     Myalgia and myositis, unspecified     Nocturia     Obesity, unspecified     Osteoarthrosis, unspecified whether generalized or localized, unspecified site     Other abnormal blood chemistry     Other headache syndromes(339.89)     Pre-diabetes     Pure hyperglyceridemia     Rash and other nonspecific skin eruption      Rotator cuff (capsule) sprain     Routine general medical examination at a health care facility     Tobacco use disorder     Unspecified essential hypertension     Unspecified hypertrophic and atrophic condition of skin     Unspecified sleep apnea               Family History  Problem Relation Age of Onset   Diabetes Mother     Heart disease Mother     Stroke Mother     Stroke Father     Leukemia Maternal Aunt          dx >50   Breast cancer Paternal Aunt          dx >50   Stomach cancer Cousin          d. 71; pat male cousin             Past Surgical History:  Procedure Laterality Date   CYSTOSCOPY N/A 08/22/2022    Procedure: CYSTOSCOPY;  Surgeon: Rosalind Zachary NOVAK, MD;  Location: [facility];  Service: Urology;  Laterality: N/A;   DENTAL SURGERY  05/10/2021    Per patient   HEMORRHOID SURGERY       PROSTATE BIOPSY       TRANSURETHRAL RESECTION OF PROSTATE N/A 08/22/2022    Procedure: TRANSURETHRAL RESECTION OF THE PROSTATE (TURP);  Surgeon: Rosalind Zachary NOVAK, MD;  Location: [facility];  Service: Urology;  Laterality: N/A;   tumorectomy Left      Removed tumor from his chest.   VARICOSE VEIN SURGERY              Short Social History:  Social History         Tobacco Use   Smoking status: Former      Current packs/day: 0.00      Average packs/day: 1.5 packs/day for 55.0 years (82.5 ttl pk-yrs)      Types: Cigarettes      Start date: 07/29/1960      Quit date: 07/30/2015      Years since quitting: 8.5   Smokeless tobacco: Never   Tobacco comments:      pt thinks increased effexor  dose helped him to quit  Substance Use Topics   Alcohol use: No      Alcohol/week: 0.0 standard drinks of alcohol      Allergies      Allergies  Allergen Reactions   Oxycontin  [Oxycodone  Hcl] Hives and Shortness Of Breath   Penicillins Hives and Shortness Of Breath   Celebrex [Celecoxib]     Erythromycin     Tetracyclines & Related                 Current Outpatient Medications  Medication Sig Dispense Refill   acetaminophen  (TYLENOL ) 325 MG tablet Take 650 mg by mouth every 6 (six) hours as needed (pain and sleep aid).       cetirizine  (ZYRTEC ) 10 MG tablet Take 1 tablet (10 mg total) by mouth daily. 30 tablet 11   Cholecalciferol (VITAMIN D3) 25 MCG (1000 UT) CAPS Take 1 capsule by mouth daily.       ciprofloxacin  (CIPRO ) 500 MG tablet Take 1 tablet (500 mg total) by mouth every 12 (twelve) hours. 14 tablet 0   darolutamide  (NUBEQA ) 300 MG tablet Take 2 tablets (600 mg total) by mouth 2 (two) times daily with a meal. 120 tablet 11   finasteride  (PROSCAR ) 5 MG tablet TAKE 1 TABLET(5 MG) BY MOUTH DAILY 90 tablet 1   losartan  (COZAAR ) 100 MG tablet TAKE 1 TABLET BY MOUTH DAILY 90 tablet 3   silodosin  (RAPAFLO ) 8 MG CAPS capsule Take 1 capsule (8 mg total) by mouth daily with breakfast. 30 capsule 3   simvastatin  (ZOCOR ) 20 MG tablet TAKE 1 TABLET(20 MG) BY MOUTH DAILY AT 6 PM 90 tablet 1   traZODone  (DESYREL ) 150 MG tablet Take 1 tablet (150 mg total) by mouth at bedtime. 90 tablet 2   triamcinolone  cream (KENALOG ) 0.1 % Apply 1 Application topically 2 (two) times daily. 30 g 4      No current facility-administered medications for this visit.        REVIEW OF SYSTEMS All other systems were reviewed and are negative       Objective:    Objective[] Expand by Default    Vitals:    02/15/24 0839  BP: (!) 154/82  Pulse: 71  Resp: 18  Temp: 98.2 F (36.8 C)  TempSrc: Temporal  SpO2: 97%  Weight: 215 lb 6.4 oz (97.7 kg)  Height: 5' 8 (1.727 m)  Body mass index is 32.75 kg/m.   Physical Exam General: no acute distress Cardiac: hemodynamically stable Pulm: normal work of breathing Abdomen: non-tender, no pulsatile mass palpated due to body habitus Neuro: alert, no focal deficit Extremities: no edema, cyanosis or wounds Vascular:              Right: palpable femoral, PT             Left: palpable femoral,  PT   Data: CTA independently reviewed 5.2 cm juxtarenal AAA.  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      Assessment/Plan:    Assessment David Mcclintock. is a 78 y.o. 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  We discussed the threshold for repair and good surgical candidates being 5.0 cm for the AAA component and 3.5 cm for the iliac component.  I explained that he is currently meets the threshold for repair of both his aortic aneurysm as well as his iliac aneurysm.   We discussed the risks and benefits of open and endovascular repair.  I explained that I would recommend an endovascular repair given his history of metastatic prostate cancer requiring chemotherapy and radiation.  We did discuss that both aneurysms would be fixed during the same time but would require a fenestrated repair meaning the stent graft would go up into the visceral segment with fenestrations made to line up with his renal arteries and possibly SMA.  I explained the risks of this including possibly losing a kidney.  He expressed understanding and elected to proceed. Plan is for FEVAR with Bluford Z-Fen device and ZBIS (iliac branch system) for the R iliac aneurysm    We also discussed his prostate cancer diagnosis and I explained that I will reach out to Dr. Tina for a better understanding of his prognosis and life expectancy.   Will get him on the schedule for aneurysm repair although final decision on proceeding will be pending my discussion with his oncologist

## 2024-03-20 NOTE — Op Note (Addendum)
 OPERATIVE NOTE  PROCEDURE:   Ultrasound-guided access of bilateral common femoral arteries with Pro-glide preclose technique Fenestrated endovascular aortic repair, Cook Z fen 34-122 Cannulation and stenting of right renal artery, 6 x 19 VBX Cannulation and stenting of left renal artery, 7 x 19 VBX Right iliac branch device placement, Cook ZBIS 12-45-41, with branches/extension into the right internal and external iliac  Cannulation and stenting of right internal iliac, 11 x 59 VBX, postdilated with 14 mm balloon Renal aortobiiliac device placement, Cook 07-07-75 Stent placement into left common iliac, Cook ZSLE 24-56 Extension stent placement into left iliac, Gore 26-4.5 Stent placement intro right common iliac, Bluford COBBS 16-56 Stent placement into right common iliac, 11 x 39 VBX  PRE-OPERATIVE DIAGNOSIS: 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  The assistant played a critical role in pinning wires while and tracking the stent graft devices and advancing the aortic balloon. These skills could not have been adequately performed by a scrub tech assistant.    ANESTHESIA: general  ESTIMATED BLOOD LOSS: 300 cc  FINDING(S): Successful placement of Zenith fenestrated device with stents into bilateral renal's.  There was excellent filling of the celiac, SMA and bilateral renal artery stents.  The right iliac branch device was then deployed without any issue and the right hypostent was tracked into position and deployed appropriately.  The infrarenal device was then deployed with 3 stents of overlap in the fenestrated device and the iliac limbs were placed into appropriate  position.  Given that there was dilation in the left common iliac proximal to the bifurcation and there appeared to be a possible 1B we elected to place a Gore 26 mm cuffed to extend down to the iliac bifurcation.  There then appeared to be adequate seal in the left iliac.  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  A balloon was placed into the high post stent and repeat angiograms were obtained which ruled out any issue from the internal iliac as there continued to be a leak.  A VBX was then placed across the overlap between the iliac limb and the proximal aspect of the branch device and this was postdilated with a 14 mm balloon, unfortunately continued to be an endoleak.  We then discussed the options of coiling and covering the right internal iliac versus replacement of the new iliac branch device or performing position modified fenestrated iliac limb but at this time we did use significant amount of contrast as well as a high radiation dose and therefore we elected to conclude with plan for follow-up to discuss reintervention after obtaining his 1 month CTA.  Fluoroscopy time: 60.8 minutes Contrast: 220 cc Radiation: 4716 mGy  SPECIMEN(S): None  INDICATIONS:   Garrick Midgley. is a 78 y.o. 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 We reviewed risks and benefits of intervention including endovascular repair versus open repair as well as continued surveillance with best medical therapy.  Given his anatomy and medical comorbidities I explained we would need to perform a FEVAR with a right iliac branch device.  We reviewed  the specific risks of bleeding, renal obstruction leading to renal failure, bowel ischemia, access site complication necessitating a groin incision, rupture and death.  The patient expressed understanding and was willing to proceed.  DESCRIPTION: The patient was brought  to the operating room and positioned supine on the OR table. The patient was prepped and draped in the usual sterile fashion. A timeout was performed and IV antibiotics were administered.  Bilateral percutaneous access of the CFA was obtained with ultrasound guidance.  Both access ease were dilated with an 8 French dilator and 2 perclose devices were placed in the pre-close technique and bilateral iliac systems were upsized to 8 [place] sheaths. The patient was systemically heparinized. Via the left access, the Bentson wire was exchanged for a Lunderquist and the 16F sheath was exchanged for the customized Z fen device.  This was placed into position in the infrarenal aorta.  A pigtail catheter was place through the right access and an aortogram was obtained and a lateral projection.  The SMA was then marked on the screen and in the graft was tracked into position, rotated appropriately and deployed with the SMA fenestration and positioned at the ostium of the SMA.  The image intensifier was then rotated to the AP position and from the right sided access the customized graft was cannulated and the Glidewire was exchanged for a Lunderquist wire.  Over this Lunderquist wire the 8 French sheath was exchanged for a 20 French dry seal sheath and this was placed into the inferior portion of the graft.  Using a 7 [place] Aptus tour guide I was able to navigate out of the right renal fenestration and a Glidewire was used to cannulate the right renal artery.  A quick cross was placed over this wire, the wire removed and an angiogram confirmed cannulation of the right renal artery.  A Rosen wire was placed into the right renal artery, the quick cross and tour guide were removed and a 6 [place] by 45 cm Ansell sheath was placed through the fenestration and into the renal artery over the [employer] wire.  The 7 [place] Aptus tour guide was then replaced through the 20 [place] sheath and using this I was able to navigate out of the left  renal fenestration and a Glidewire was used to cannulate the left renal artery.  A quick cross catheter was placed over this wire and the wire was removed, and angiogram confirmed left renal artery cannulation.  A Rosen wire was then placed into the left renal artery and a 7 mm x 19 mm VBX was tracked into position.  A 6 mm x 19 mm VBX was then tracked into position over the of the Rosen wire into the right renal artery.  The customized Zenith fenestrated graft was then fully deployed.  The deployment system was then removed over the wire and a 20 French dry seal sheath was placed into the access site.  The proximal space of the graft was then ballooned with a Coda balloon via the left groin access.  Both renal stents were then deployed with a few millimeters into the aorta and the aortic portion of the stents was postdilated with a 9 mm Mustang balloon.  Angiograms reviewed that she has demonstrated excellent filling of the renal arteries and without any 1C endoleak.  Attention was then turned to the iliac branch device.  The renal wires and sheaths were remove leaving behind a 20 French sheath and a Lunderquist wire in each access.  On the right, the 20 French sheath was exchanged for the preselected [facility] iliac branch device.  This was tracked into position in the right common iliac artery.  On the left a snare catheter was placed.  The iliac branch device was then deployed a few centimeters to expose the catheter and wire.  Using a Glidewire through this catheter and the snare catheter from the left side up and over body floss was obtained.  The iliac branch device was then deployed to the level of the internal iliac gate.  An 8 [place] by 80 cm Cook sheath was then tracked over the body floss wire and into position in the internal iliac gate.  This 8 French sheath was then buddy wired with a second Glidewire and this was used to navigate through the internal iliac gate and into the right internal iliac.  Over  this wire a quick cross catheter was tracked and an angiogram was obtained, the first branch point was marked and a Rosen wire was placed into the right internal iliac artery.  Via this Rosen wire a 11 x 39 VBX was placed into position and deployed.  This was then postdilated with a 14 mm balloon given the 13 mm size of the right internal iliac artery.  The remainder of the external iliac portion of the iliac branch device was deployed.  The deployment device was then removed and a 20 French sheath was replaced with the access.  The Rosen wire and a [place] sheath were then removed from the left femoral access leaving the Lunderquist wire and a 20 French sheath in place.  This 20 French sheath was then exchanged for the infrarenal portion of the fenestrated device.  This was tracked into position with 3 stents of overlap and well below the renal artery fenestrations.  This was deployed to the contralateral gate with the contralateral gate in an anatomic position.  Via the right femoral access the gate was then cannulated which was confirmed with spinning a Omni Flush catheter.  This Glidewire was then exchanged for a Lunderquist wire and the iliac limb was advanced over this wire and into position.  This was deployed according to IFU bridging the gate of the aortic device and the proximal main body portion of the iliac branch device on the right.  It should be noted that there was excellent overlap both proximally and distally.  The bifurcated infrarenal device was then completely deployed and the deployment device was removed over the wire, a 20 French sheath was replaced through the left access.  A left iliac angiogram was then obtained in an RAO and caudal position and the left internal iliac artery was marked in a screen.  An Omni Flush catheter was then tracked over the Lunderquist wire on the left and a measurement from the flow divider to the left hypo was obtained and the device was selected.  This was  deployed according to IFU with excellent overlap proximally and about 1-1/2 cm proximal to the left iliac bifurcation distally.  All infrarenal open labs were then ballooned with a Coda balloon and a Omni Flush catheter was placed over wire into the distal thoracic aorta and a completion angiogram was obtained.  This demonstrated excellent flow into the celiac, SMA and bilateral renal arteries with brisk flow through the renal artery stents.  There was adequate filling of both internal iliac arteries although there was a brisk endoleak and was filling both the aortic aneurysm as well as the  right iliac aneurysm.  Originally we thought there may be a 1C endoleak from the left renal therefore using a 7 [place] Aptus a selective angiogram was obtained which did not demonstrate a significant leak.  The Omni Flush catheter was then pulled down into the infrarenal segment and angiograms were obtained in different projections and it appeared at that there may be a large endoleak from the right iliac as well as a possible 1B endoleak from the left iliac.  On the left a 26 x 4.5 Gore cuff was deployed to extend to the iliac bifurcation.  This was ballooned with a Coda balloon and a repeat angiogram demonstrated then appeared to have adequate seal.  For the leak on the right, the overlap of the aortic gate as well as the iliac branch device was re-ballooned and repeat angiograms were obtained which continued to demonstrate a significant leak from the iliac branch device that was rapidly filling the right common iliac artery aneurysm.  Using a 7 French Aptus from the right groin access I was able to cannulate the right internal iliac and a an 8 mm balloon was tracked over this wire and placed into the right internal iliac stent.  A repeat angiogram was obtained which still demonstrated a brisk endoleak filling the right common iliac aneurysm, this effectively ruled out any issues with the internal iliac stent or Ib from the  internal iliac.  We then assume that there must be a leak between the bridging iliac limb and the proximal aspect of the iliac branch device and therefore I elected to place a VBX.  An 11 x 39 VBX was deployed in the overlap section with care not to cover the branch for the hypo.  This was then postdilated with the 14 mm balloon.  Unfortunately repeat angiogram still demonstrated brisk leak and therefore we concluded that there must be an issue with the iliac branch device somewhere at the flow divider.  At this time we had used quite a bit of contrast as well as significant radiation dose to stop with plan for short-term follow-up with a CTA scan in order to further evaluate the endoleak and potentially plan for a second intervention at that time. The arteriotomies were closed over a wire using the previously placed perc-close devices with excellent hemostasis. Pedal pulses were checked and found to be stable from pre-op. Heparin  was reversed with protamine  and the skin was closed with 4-0 monocryl suture. Dry sterile dressing was placed. The patient was transported to the recovery room in stable condition.    COMPLICATIONS: None apparent  CONDITION: Stable, possible type III endoleak noted from the right iliac branch device.  Norman GORMAN Serve MD [facility] Phone Number: [phone] 03/20/2024 2:49 PM

## 2024-03-20 NOTE — Transfer of Care (Signed)
 Immediate Anesthesia Transfer of Care Note  Patient: David [name].  Procedure(s) Performed: ABDOMINAL AORTIC ENDOVASCULAR FENESTRATED STENT GRAFT (Bilateral)  Patient Location: PACU  Anesthesia Type:General  Level of Consciousness: drowsy and patient cooperative  Airway & Oxygen Therapy: Patient Spontanous Breathing and Patient connected to nasal cannula oxygen  Post-op Assessment: Report given to RN and Post -op Vital signs reviewed and stable  Post vital signs: Reviewed and stable  Last Vitals:  Vitals Value Taken Time  BP    Temp    Pulse 59 03/20/24 13:25  Resp 11 03/20/24 13:25  SpO2 96 % 03/20/24 13:25  Vitals shown include unfiled device data.  Last Pain:  Vitals:   03/20/24 9367  TempSrc:   PainSc: 0-No pain      Patients Stated Pain Goal: 0 (03/20/24 9367)  Complications: No notable events documented.

## 2024-03-20 NOTE — Anesthesia Procedure Notes (Signed)
 Arterial Line Insertion Start/End9/05/2024 7:10 AM, 03/20/2024 7:18 AM Performed by: Erma Thom SAUNDERS, MD, Bronsen Serano, Lonni PARAS, CRNA, CRNA  Patient location: Pre-op. Preanesthetic checklist: patient identified, IV checked, site marked, risks and benefits discussed, surgical consent, monitors and equipment checked, pre-op evaluation, timeout performed and anesthesia consent Lidocaine  1% used for infiltration Right, radial was placed Catheter size: 20 G Hand hygiene performed  and Seldinger technique used Allen's test indicative of satisfactory collateral circulation Attempts: 1 Procedure performed without using ultrasound guided technique. Following insertion, Biopatch. Post procedure assessment: normal  Patient tolerated the procedure well with no immediate complications.

## 2024-03-20 NOTE — Anesthesia Postprocedure Evaluation (Signed)
 Anesthesia Post Note  Patient: David [name].  Procedure(s) Performed: ABDOMINAL AORTIC ENDOVASCULAR FENESTRATED STENT GRAFT, Stenting of Right Internal Iliac Artery, (Bilateral) INSERTION, STENT, ARTERY, ILIAC (Bilateral: Groin)     Patient location during evaluation: PACU Anesthesia Type: General Level of consciousness: awake and alert Pain management: pain level controlled Vital Signs Assessment: post-procedure vital signs reviewed and stable Respiratory status: spontaneous breathing, nonlabored ventilation, respiratory function stable and patient connected to nasal cannula oxygen Cardiovascular status: blood pressure returned to baseline and stable Postop Assessment: no apparent nausea or vomiting Anesthetic complications: no   No notable events documented.  Last Vitals:  Vitals:   03/20/24 1400 03/20/24 1424  BP: 120/65 119/73  Pulse: (!) 59 (!) 58  Resp: 14 15  Temp: 36.4 C 36.5 C  SpO2: 94% 95%    Last Pain:  Vitals:   03/20/24 1424  TempSrc: Oral  PainSc: 0-No pain                 Thom JONELLE Peoples

## 2024-03-21 ENCOUNTER — Other Ambulatory Visit (HOSPITAL_COMMUNITY): Payer: Self-pay

## 2024-03-21 ENCOUNTER — Other Ambulatory Visit (HOSPITAL_COMMUNITY)

## 2024-03-21 DIAGNOSIS — Z9889 Other specified postprocedural states: Secondary | ICD-10-CM

## 2024-03-21 DIAGNOSIS — Z95828 Presence of other vascular implants and grafts: Secondary | ICD-10-CM

## 2024-03-21 LAB — CBC
HCT: 30.7 % — ABNORMAL LOW (ref 39.0–52.0)
Hemoglobin: 10.4 g/dL — ABNORMAL LOW (ref 13.0–17.0)
MCH: 33.3 pg (ref 26.0–34.0)
MCHC: 33.9 g/dL (ref 30.0–36.0)
MCV: 98.4 fL (ref 80.0–100.0)
Platelets: 132 K/uL — ABNORMAL LOW (ref 150–400)
RBC: 3.12 MIL/uL — ABNORMAL LOW (ref 4.22–5.81)
RDW: 13.5 % (ref 11.5–15.5)
WBC: 6.3 K/uL (ref 4.0–10.5)
nRBC: 0 % (ref 0.0–0.2)

## 2024-03-21 LAB — BASIC METABOLIC PANEL WITH GFR
Anion gap: 10 (ref 5–15)
BUN: 9 mg/dL (ref 8–23)
CO2: 23 mmol/L (ref 22–32)
Calcium: 7.8 mg/dL — ABNORMAL LOW (ref 8.9–10.3)
Chloride: 104 mmol/L (ref 98–111)
Creatinine, Ser: 0.64 mg/dL (ref 0.61–1.24)
GFR, Estimated: >60 mL/min (ref >60)
Glucose, Bld: 112 mg/dL — ABNORMAL HIGH (ref 70–99)
Potassium: 3.6 mmol/L (ref 3.5–5.1)
Sodium: 137 mmol/L (ref 135–145)

## 2024-03-21 MED ORDER — CLOPIDOGREL BISULFATE 75 MG PO TABS
75.0000 mg | ORAL_TABLET | Freq: Every day | ORAL | 11 refills | Status: AC
  Filled 2024-03-21 – 2024-04-24 (×2): qty 30, 30d supply, fill #0

## 2024-03-21 MED ORDER — HYDROCODONE-ACETAMINOPHEN 5-325 MG PO TABS
1.0000 | ORAL_TABLET | Freq: Four times a day (QID) | ORAL | 0 refills | Status: DC | PRN
  Filled 2024-03-21: qty 8, 2d supply, fill #0

## 2024-03-21 MED ORDER — ASPIRIN 81 MG PO TBEC
81.0000 mg | DELAYED_RELEASE_TABLET | Freq: Every day | ORAL | 12 refills | Status: AC
  Filled 2024-03-21 – 2024-04-24 (×2): qty 30, 30d supply, fill #0

## 2024-03-21 NOTE — Discharge Summary (Signed)
 [facility] Discharge Summary   David [name] [birth year] 78 y.o. male  MRN: [id number]  Admission Date: 03/20/2024  Discharge Date: 03/21/2024  Physician: No att. 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 male presenting for follow-up of infrarenal AAA. He denies any new or unusual abdominal or back pain. He is overall doing well. He reports that he is finished chemo and radiation therapy for his prostate cancer. He says he does have times of fatigue but overall can get around okay. He he does the food shopping as his wife has dementia but reports he usually buys some things for breakfast as they go out to eat for other meals every day.   Hospital Course:  The patient was admitted to the hospital and taken to the operating room on 03/20/2024 and underwent: Ultrasound-guided access of bilateral common femoral arteries with Pro-glide preclose technique Fenestrated endovascular aortic repair, Cook Z fen 34-122 Cannulation and stenting of right renal artery, 6 x 19 VBX Cannulation and stenting of left renal artery, 7 x 19 VBX Right iliac branch device placement, Cook ZBIS 12-45-41 Cannulation and stenting of right internal iliac, 11 x 59 VBX, postdilated with 14 mm balloon Renal aortobiiliac device placement, Cook 07-07-75 Stent placement into left common iliac, Bluford COBBS 24-56 Extension stent placement into left iliac, Gore 26-4.5 Stent placement intro right common iliac, Bluford COBBS 16-56 Stent placement into right common iliac, 11 x 39 VBX    Findings: Successful placement of Zenith fenestrated device with stents into bilateral renal's.  There was excellent filling of the celiac, SMA and bilateral renal artery stents.  The right iliac branch  device was then deployed without any issue and the right hypostent was tracked into position and deployed appropriately.  The infrarenal device was then deployed with 3 stents of overlap in the fenestrated device and the iliac limbs were placed into appropriate position.  Given that there was dilation in the left common iliac proximal to the bifurcation and there appeared to be a possible 1B we elected to place a Gore 26 mm cuffed to extend down to the iliac bifurcation.  There then appeared to be adequate seal in the left iliac.  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  A balloon was placed into the high post stent and repeat angiograms were obtained which ruled out any issue from the internal iliac as there continued to be a leak.  A VBX was then placed across the overlap between the iliac limb and the proximal aspect of the branch device and this was postdilated with a 14 mm balloon, unfortunately continued to be an endoleak.  We then discussed the options of coiling and covering the right internal iliac versus replacement of the new iliac branch device or performing position modified fenestrated iliac limb but at this time we did use significant amount of contrast as well as a high radiation dose and therefore we elected to conclude with plan for follow-up to discuss reintervention after obtaining his 1 month CTA.   The pt tolerated the procedure well and was transported to the PACU in good condition.   By POD 1, pt was doing well.  He was able to ambulate and take in  solids without difficulty.  He was started on asa/plavix .  He has permanent indwelling foley.     CBC    Component Value Date/Time   WBC 6.3 03/21/2024 0510   RBC 3.12 (L) 03/21/2024 0510   HGB 10.4 (L) 03/21/2024 0510   HGB 12.9 (L) 03/03/2024 0804   HCT 30.7 (L) 03/21/2024 0510   PLT 132 (L) 03/21/2024 0510   PLT 179 03/03/2024 0804   MCV 98.4 03/21/2024 0510    MCH 33.3 03/21/2024 0510   MCHC 33.9 03/21/2024 0510   RDW 13.5 03/21/2024 0510   RDW 13.8 10/13/2014 0817   LYMPHSABS 1.3 03/03/2024 0804   LYMPHSABS 2.4 10/13/2014 0817   MONOABS 0.4 03/03/2024 0804   EOSABS 0.1 03/03/2024 0804   EOSABS 0.2 10/13/2014 0817   BASOSABS 0.1 03/03/2024 0804   BASOSABS 0.1 10/13/2014 0817    BMET    Component Value Date/Time   NA 137 03/21/2024 0510   NA 138 11/03/2015 0835   K 3.6 03/21/2024 0510   CL 104 03/21/2024 0510   CO2 23 03/21/2024 0510   GLUCOSE 112 (H) 03/21/2024 0510   BUN 9 03/21/2024 0510   BUN 16 11/03/2015 0835   CREATININE 0.64 03/21/2024 0510   CREATININE 0.66 03/03/2024 0804   CREATININE 0.71 12/25/2022 0825   CALCIUM  7.8 (L) 03/21/2024 0510   GFRNONAA >60 03/21/2024 0510   GFRNONAA >60 03/03/2024 0804   GFRNONAA 87 11/12/2020 1158   GFRAA 101 11/12/2020 1158       Discharge Instructions     Discharge patient   Complete by: As directed    Discharge after pt has walked.  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] Status post abdominal aortic aneurysm (AAA) repair [S01.109, Z86.79] S/P AAA repair [S01.109, Z86.79]  Secondary Diagnosis: Patient Active Problem List   Diagnosis Date Noted   Status post abdominal aortic aneurysm (AAA) repair 03/20/2024   S/P AAA repair 03/20/2024   Normocytic anemia 03/03/2024   Goals of care, counseling/discussion 02/01/2024   Infestation by bed bug 05/28/2023   Genetic testing 04/12/2023   Abdominal aortic aneurysm (AAA) (HCC) 03/29/2023   Prostate cancer metastatic to bone (HCC) 03/27/2023   At risk for side effect of medication 03/27/2023   BPH (benign prostatic hyperplasia) 08/22/2022   Pain in left shoulder 11/03/2020   History of melanoma 04/05/2020   Left sided  sciatica 12/01/2019   Postherpetic neuralgia 11/28/2018   Primary osteoarthritis of left knee 11/28/2018   Need for influenza vaccination 03/28/2018   S/P cataract extraction and insertion of intraocular lens, right 01/08/2017   Environmental allergies 11/08/2015   Seasonal allergies 11/08/2015   Encounter for smoking cessation counseling 11/08/2015   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   Hypertriglyceridemia 09/11/2013   Depression 09/11/2013   Insomnia 09/11/2013   Generalized anxiety disorder 09/11/2013   Dry mouth 09/11/2013   Hyperglycemia 09/11/2013   Mixed hyperlipidemia 10/28/2012   Essential hypertension 10/28/2012   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   Obesity, unspecified 10/28/2012   Past Medical History:  Diagnosis Date   AAA (abdominal aortic aneurysm) (HCC)    Allergic rhinitis due to pollen    Anxiety state, unspecified    Asymptomatic varicose veins    Atherosclerosis of native arteries of the extremities, unspecified  Cataract 10/17/2016   L Eye   Contact dermatitis and other eczema due to other chemical products    Diaphragmatic hernia without mention of obstruction or gangrene    Elevated PSA    Flatulence, eructation, and gas pain    Headache(784.0)    Hemorrhoids, external    Herpes zoster with unspecified complication    Hyperpotassemia    Insomnia, unspecified    Lumbago    Lumbago    Myalgia and myositis, unspecified    Nocturia    Obesity, unspecified    Osteoarthrosis, unspecified whether generalized or localized, unspecified site    Other abnormal blood chemistry    Other headache syndromes(339.89)    Pre-diabetes    Pure hyperglyceridemia    Rash and other nonspecific skin eruption    Rotator cuff (capsule) sprain    Routine general medical examination at a health care facility    Tobacco use disorder    Unspecified essential hypertension    Unspecified hypertrophic and atrophic condition of skin     Unspecified sleep apnea      Allergies as of 03/21/2024       Reactions   Celebrex [celecoxib] Hives, Shortness Of Breath   Erythromycin Hives, Shortness Of Breath   Oxycontin  [oxycodone  Hcl] Hives, Shortness Of Breath   Penicillins Hives, Shortness Of Breath   Tetracyclines & Related Hives, Shortness Of Breath   Tape Rash        Medication List     STOP taking these medications    triamcinolone  cream 0.1 % Commonly known as: KENALOG        TAKE these medications    acetaminophen  325 MG tablet Commonly known as: TYLENOL  Take 650 mg by mouth every 6 (six) hours as needed (pain and sleep aid).   aspirin  EC 81 MG tablet Take 1 tablet (81 mg total) by mouth daily at 6 (six) AM. Swallow whole. Start taking on: March 22, 2024   calcium -vitamin D 500-5 MG-MCG tablet Commonly known as: OSCAL WITH D Take 1 tablet by mouth daily at 6 (six) AM.   cetirizine  10 MG tablet Commonly known as: ZYRTEC  Take 1 tablet (10 mg total) by mouth daily.   clopidogrel  75 MG tablet Commonly known as: PLAVIX  Take 1 tablet (75 mg total) by mouth daily. Start taking on: March 22, 2024   darolutamide  300 MG tablet Commonly known as: NUBEQA  Take 2 tablets (600 mg total) by mouth 2 (two) times daily with a meal.   finasteride  5 MG tablet Commonly known as: PROSCAR  TAKE 1 TABLET(5 MG) BY MOUTH DAILY   HYDROcodone -acetaminophen  5-325 MG tablet Commonly known as: NORCO/VICODIN Take 1 tablet by mouth every 6 (six) hours as needed for moderate pain (pain score 4-6).   LORazepam  0.5 MG tablet Commonly known as: ATIVAN  Take by mouth as directed.   losartan  100 MG tablet Commonly known as: COZAAR  TAKE 1 TABLET BY MOUTH DAILY   silodosin  8 MG Caps capsule Commonly known as: RAPAFLO  Take 1 capsule (8 mg total) by mouth daily with breakfast.   simvastatin  20 MG tablet Commonly known as: ZOCOR  TAKE 1 TABLET(20 MG) BY MOUTH DAILY AT 6 PM   traZODone  150 MG tablet Commonly known  as: DESYREL  Take 1 tablet (150 mg total) by mouth at bedtime.   Vitamin D3 25 MCG (1000 UT) Caps Take 1 capsule (1,000 Units total) by mouth daily.        Discharge Instructions:  [facility]  Discharge Instructions  Endovascular Aortic Aneurysm Repair  Please refer to the following instructions for your post-procedure care. Your surgeon or Physician Assistant will discuss any changes with you.  Activity  You are encouraged to walk as much as you can. You can slowly return to normal activities but must avoid strenuous activity and heavy lifting until your doctor tells you it's OK. Avoid activities such as vacuuming or swinging a gold club. It is normal to feel tired for several weeks after your surgery. Do not drive until your doctor gives the OK and you are no longer taking prescription pain medications. It is also normal to have difficulty with sleep habits, eating, and bowel movements after surgery. These will go away with time.  Bathing/Showering  You may shower after you go home. If you have an incision, do not soak in a bathtub, hot tub, or swim until the incision heals completely.  Incision Care  Shower every day. Clean your incision with mild soap and water . Pat the area dry with a clean towel. You do not need a bandage unless otherwise instructed. Do not apply any ointments or creams to your incision. If you clothing is irritating, you may cover your incision with a dry gauze pad.  Diet  Resume your normal diet. There are no special food restrictions following this procedure. A low fat/low cholesterol diet is recommended for all patients with vascular disease. In order to heal from your surgery, it is CRITICAL to get adequate nutrition. Your body requires vitamins, minerals, and protein. Vegetables are the best source of vitamins and minerals. Vegetables also provide the perfect balance of protein. Processed food has little nutritional value, so  try to avoid this.  Medications  Resume taking all of your medications unless your doctor or Physician Assistnat tells you not to. If your incision is causing pain, you may take over-the-counter pain relievers such as acetaminophen  (Tylenol ). If you were prescribed a stronger pain medication, please be aware these medications can cause nausea and constipation. Prevent nausea by taking the medication with a snack or meal. Avoid constipation by drinking plenty of fluids and eating foods with a high amount of fiber, such as fruits, vegetables, and grains.  Do not take Tylenol  if you are taking prescription pain medications.   Follow up  Our office will schedule a follow-up appointment with a C.T. scan 3-4 weeks after your surgery.  Please call us  immediately for any of the following conditions  Severe or worsening pain in your legs or feet or in your abdomen back or chest. Increased pain, redness, drainage (pus) from your incision sit. Increased abdominal pain, bloating, nausea, vomiting or persistent diarrhea. Fever of 101 degrees or higher. Swelling in your leg (s),  Reduce your risk of vascular disease  Stop smoking. If you would like help call [facility] at 1-800-QUIT-NOW (505 288 1582) or [place] at [phone]. Manage your cholesterol Maintain a desired weight Control your diabetes Keep your blood pressure down  If you have questions, please call the office at [phone].   Prescriptions given: 1.  Norco# 8 No Refill 2.  Plavix  75mg  #30 eleven refills 3.  Asa 81mg  daily  Disposition: home  Patient's condition: is Good  Follow up: 1. Dr. Pearline in 4-6 weeks with CTA protocol   Lucie Apt, PA-C [facility] [phone] 03/21/2024  3:05 PM   - For VQI Registry use - Post-op:  Time to Extubation: [x]  In OR, [ ]  < 12 hrs, [ ]  12-24 hrs, [ ]  >=24  hrs Vasopressors Req. Post-op: No MI: No., [ ]  Troponin only, [ ]  EKG or Clinical New  Arrhythmia: No CHF: No ICU Stay: 1 day in progressive Transfusion: No     If yes, n/a units given  Complications: Resp failure: No., [ ]  Pneumonia, [ ]  Ventilator Chg in renal function: No., [ ]  Inc. Cr > 0.5, [ ]  Temp. Dialysis,  [ ]  Permanent dialysis Leg ischemia: No., no Surgery needed, [ ]  Yes, Surgery needed,  [ ]  Amputation Bowel ischemia: No., [ ]  Medical Rx, [ ]  Surgical Rx Wound complication: No., [ ]  Superficial separation/infection, [ ]  Return to OR Return to OR: No  Return to OR for bleeding: No Stroke: No., [ ]  Minor, [ ]  Major  Discharge medications: Statin use:  Yes  ASA use:  yes Plavix  use:  Yes-for 6 weeks then baby asa for lifetime  Beta blocker use:  No  ARB use:  Yes ACEI use:  No CCB use:  No

## 2024-03-21 NOTE — Discharge Instructions (Signed)

## 2024-03-21 NOTE — Progress Notes (Signed)
 Mobility Specialist Progress Note:    03/21/24 0955  Mobility  Activity Ambulated with assistance  Level of Assistance Contact guard assist, steadying assist  Assistive Device None  Distance Ambulated (ft) 150 ft  Activity Response Tolerated well  Mobility Referral Yes  Mobility visit 1 Mobility  Mobility Specialist Start Time (ACUTE ONLY) 0955  Mobility Specialist Stop Time (ACUTE ONLY) 1005  Mobility Specialist Time Calculation (min) (ACUTE ONLY) 10 min   Pt received in bed. Tolerated well, asx throughout. No AD, CGA with gait belt for safety. Returned pt to room, sister at bedside, all needs met.  [employer] [employer] Mobility Specialist Please contact via Special educational needs teacher or  Rehab office at [phone]

## 2024-03-21 NOTE — Progress Notes (Signed)
   03/21/24 1015  TOC Brief Assessment  Insurance and Status Reviewed  Patient has primary care physician Yes  Home environment has been reviewed home w/ spouse  Prior level of function: independent  Prior/Current Home Services No current home services  Social Drivers of Health Review SDOH reviewed no interventions necessary  Readmission risk has been reviewed Yes  Transition of care needs no transition of care needs at this time    Pt stable for transition home today s/p AAA repair, Pt is home w/ wife who has dementia. Pt will need assist with transportation home- discharge lounge to assist with cab voucher.  No HH or DME needs noted. No INPT Care management needs noted.

## 2024-03-21 NOTE — Progress Notes (Addendum)
  Progress Note    03/21/2024 6:29 AM 1 Day Post-Op  Subjective:  sitting on the side of the bed eating.  No complaints.  Has permanent foley.    Afebrile HR 60's-90's NSR 100's-130's systolic 91% RA  Gtts:  none  Vitals:   03/20/24 2249 03/21/24 0344  BP: 130/67 126/62  Pulse: 83 73  Resp: 17 20  Temp: 98.6 F (37 C) 98.7 F (37.1 C)  SpO2: 95% 94%    Physical Exam: General:  no distress Lungs:  non labored   CBC    Component Value Date/Time   WBC 6.3 03/21/2024 0510   RBC 3.12 (L) 03/21/2024 0510   HGB 10.4 (L) 03/21/2024 0510   HGB 12.9 (L) 03/03/2024 0804   HCT 30.7 (L) 03/21/2024 0510   PLT 132 (L) 03/21/2024 0510   PLT 179 03/03/2024 0804   MCV 98.4 03/21/2024 0510   MCH 33.3 03/21/2024 0510   MCHC 33.9 03/21/2024 0510   RDW 13.5 03/21/2024 0510   RDW 13.8 10/13/2014 0817   LYMPHSABS 1.3 03/03/2024 0804   LYMPHSABS 2.4 10/13/2014 0817   MONOABS 0.4 03/03/2024 0804   EOSABS 0.1 03/03/2024 0804   EOSABS 0.2 10/13/2014 0817   BASOSABS 0.1 03/03/2024 0804   BASOSABS 0.1 10/13/2014 0817    BMET    Component Value Date/Time   NA 135 03/20/2024 1510   NA 138 11/03/2015 0835   K 4.3 03/20/2024 1510   CL 104 03/20/2024 1510   CO2 24 03/20/2024 1510   GLUCOSE 146 (H) 03/20/2024 1510   BUN 9 03/20/2024 1510   BUN 16 11/03/2015 0835   CREATININE 0.57 (L) 03/20/2024 1510   CREATININE 0.66 03/03/2024 0804   CREATININE 0.71 12/25/2022 0825   CALCIUM  8.0 (L) 03/20/2024 1510   GFRNONAA >60 03/20/2024 1510   GFRNONAA >60 03/03/2024 0804   GFRNONAA 87 11/12/2020 1158   GFRAA 101 11/12/2020 1158    INR    Component Value Date/Time   INR 1.1 03/20/2024 1510     Intake/Output Summary (Last 24 hours) at 03/21/2024 9370 Last data filed at 03/21/2024 0504 Gross per 24 hour  Intake 5621.72 ml  Output 3175 ml  Net 2446.72 ml      Assessment/Plan:  78 y.o. male is s/p:  Fenestrated endovascular aortic repair, Bluford PEDLAR fen, bilateral renal artery,  right IIIA, left CIA, right CIA stenting 03/20/2024 by Dr. Pearline for  4.2 cm right common iliac aneurysm with concomitant 5.2 cm asymptomatic AAA (non-ruptured) on 03/20/2024 1 Day Post-Op   -pt sitting on side of bed this am eating breakfast.   -continue asa/plavix /statin -pt has permanent foley and will not be removed.   -DVT prophylaxis:  sq heparin    Lucie Apt, PA-C [facility] [phone] 03/21/2024 6:29 AM  VASCULAR STAFF ADDENDUM: I have independently interviewed and examined the patient. I agree with the above.  Plan for discharge today with follow up in 4-6 weeks with CTA abd pelvis Aspirin  and plavix  will be prescribed on DC  Norman GORMAN Pearline MD [facility] Phone Number: [phone] 03/21/2024 10:50 AM

## 2024-03-21 NOTE — Progress Notes (Signed)
 Patient and sister verbalized understanding of discharge instruction.  1 PIV removed Pressure dressing applied.  No other PIV or lines observed during discharge process.  Tele removed by Primary RN.  Patient has chronic foley cath leg bag.   [facility] lounge called for transport to [employer] and lounge.

## 2024-03-22 NOTE — Progress Notes (Signed)
 [name] OFFICE PROGRESS NOTE  Patient Care Team: David Greig BRAVO, NP as PCP - General (Adult Health Nurse Practitioner) David Molly, MD as Consulting Physician (Ophthalmology) David Greig BRAVO, NP as Nurse Practitioner (Adult Health Nurse Practitioner) David Pont, RN as Oncology Nurse Navigator  David [name] is a 78 y.o. man with history of hypertension, hyperlipidemia here for follow-up prostate cancer.   Current Diagnosis: mCRPC Initial diagnosis: 08/2022. mHSPC with lymph node, bone, lung metastases. From TURP Gleason score 4+5=9 (grade group5)  Previous treatment: Had one dose of docetaxel . Given the AAA, discuss potential rupture which we cannot predict. After discussion will use lower dose also with his age.  Current Treatment: ADT, darolutamide  (started 06/29/2023). Genetic testing for metastatic prostate cancer: neg. 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   Somatic testing, no actionable mutation.  Variant of SF3B1, TP53, CTNNB1. pMMR. TMB 3.7. No HRR mutation.  Getting ADT through [facility] and also monthly Xgeva  and catheter exchange there.   AAA repair last week.   Now with CRPC.  PET showed more than 5 sites of PSMA avid lesions.  PSA doubling time is less than 3 months.  We discussed chemotherapy versus Pluvicto today.  We discussed Pluvicto efficacy, side effects today.  We discussed logistics.  He is healing from his surgery last week.  Will make a nonurgent referral to nuclear medicine for evaluation.  Also discussed precautions with Pluvicto. Assessment & Plan Prostate cancer metastatic to bone [name]) Continue ADT (last in June 2025, 6 mo. Dose at [facility]) Continue darolutamide  (06/29/23). Supply via mail He is on low-dose simvastatin , no added toxicity. Referral for Pluvicto Return to see me with PSA, testosterone  in about 8 weeks History of radiation completed on L hip/pelvis 30 Gy/10 fx At risk for side effect of  medication baseline bone mineral density: 04/06/2023 Right Femur Neck  T= -1.8 osteopenia  calcium  (1000-1200 mg daily from food and supplements) and vitamin D3 (1000 IU daily) Control and prevent diabetes He has no teeth. He get Xgeva  at [facility] monthly Recommend exercises and activities as able Limit alcohol consumption and avoid smoking Monitoring control blood pressure Normocytic anemia Will repeat B12 level today.  Add MMA.    Orders Placed This Encounter  Procedures   NM Radiologist Eval And Mgmt    Standing Status:   Future    Expected Date:   04/07/2024    Expiration Date:   03/24/2025    Reason for Exam (SYMPTOM  OR DIAGNOSIS REQUIRED):   mCRPC for Pluvicto    If indicated for the ordered procedure, I authorize the administration of a radiopharmaceutical per Radiology protocol:   Yes    Preferred imaging location?:   [name]     David JAYSON Chihuahua, MD  INTERVAL HISTORY: Patient returns for follow-up. He is recovering his surgery with expected pain in the groin.  No other new symptoms.  Oncology History  Prostate cancer metastatic to bone [name])  08/2022 Surgery   TURP for BPH: Prostatic adenocarcinoma, Gleason score 4+5=9 (grade group5) involves 80% of the resected tissue.  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    08/2022 Initial Diagnosis   Prostate cancer metastatic to bone (HCC)   09/28/2022 Tumor Marker   PSA 33.5   10/30/2022 Imaging   PSMA PET IMPRESSION: 1. There is diffuse radiotracer uptake  throughout the prostate gland compatible with residual/recurrent prostate cancer. 2. Bilateral tracer avid pelvic lymph nodes compatible with nodal metastasis. 3. Multifocal tracer avid bone metastases. 4. Solitary tracer avid pulmonary nodule within the right upper lobe is identified. 5. 5.2 cm infrarenal abdominal  aortic aneurysm. Recommend follow-up every 6 months and vascular consultation. This recommendation follows ACR consensus guidelines: White Paper of the ACR Incidental Findings Committee II on Vascular Findings. J Am Coll Radiol 2013; 10:789-794. Aortic aneurysm NOS (ICD10-I71.9). 6.  Aortic Atherosclerosis (ICD10-I70.0).   11/2022 -  Chemotherapy   started Orgovynx and converted to Eligard .   03/02/2023 Tumor Marker   PSA 14.8 testosterone  19.7. alk phos 77   04/09/2023 Genetic Testing   Negative CustomNext-Cancer +RNAinsight Panel.  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  Report date is 04/09/2023.   The CustomNext-Cancer+RNAinsight panel offered by [name] includes sequencing, rearrangement, and RNA analysis for the following 47 genes:  APC, ATM, AXIN2, BAP1, BARD1, BMPR1A, BRCA1, BRCA2, BRIP1, CDH1, CDK4, CDKN2A, CHEK2, CTNNA1, DICER1, EPCAM, FH, GREM1, HOXB13, KIT, MEN1, MLH1, MSH2, MSH3, MSH6, MUTYH, NF1, NTHL1, PALB2, PDGFRA, PMS2, POLD1, POLE, PTEN, RAD51C, RAD51D, SDHA, SDHB, SDHC, SDHD, SMAD4, SMARCA4, STK11, TP53, TSC1, TSC2, VHL.     04/2023 -  Chemotherapy   Started Xtandi 160 mg.   06/29/2023 - 06/29/2023 Chemotherapy   Patient is on Treatment Plan : PROSTATE Docetaxel  (50) q14d        PHYSICAL EXAMINATION: ECOG PERFORMANCE STATUS: 2 - Symptomatic, <50% confined to bed  Vitals:   03/24/24 1025  BP: (!) 112/52  Pulse: 78  Resp: 20  Temp: 97.9 F (36.6 C)  SpO2: 95%   Filed Weights   03/24/24 1025  Weight: 214 lb 11.2 oz (97.4 kg)    GENERAL: alert, no distress and comfortable SKIN: skin color normal and no jaundice  ABDOMEN: abdomen soft.  Bilateral inguinal incision dry and intact. Foley in place.   Relevant data reviewed during this visit included labs and imaging.

## 2024-03-24 ENCOUNTER — Inpatient Hospital Stay

## 2024-03-24 ENCOUNTER — Other Ambulatory Visit: Payer: Self-pay | Admitting: *Deleted

## 2024-03-24 ENCOUNTER — Inpatient Hospital Stay (HOSPITAL_BASED_OUTPATIENT_CLINIC_OR_DEPARTMENT_OTHER)

## 2024-03-24 ENCOUNTER — Other Ambulatory Visit: Payer: Self-pay

## 2024-03-24 VITALS — BP 112/52 | HR 78 | Temp 97.9°F | Resp 20 | Wt 214.7 lb

## 2024-03-24 DIAGNOSIS — Z9189 Other specified personal risk factors, not elsewhere classified: Secondary | ICD-10-CM | POA: Diagnosis not present

## 2024-03-24 DIAGNOSIS — D649 Anemia, unspecified: Secondary | ICD-10-CM

## 2024-03-24 DIAGNOSIS — C61 Malignant neoplasm of prostate: Secondary | ICD-10-CM | POA: Diagnosis not present

## 2024-03-24 DIAGNOSIS — C7800 Secondary malignant neoplasm of unspecified lung: Secondary | ICD-10-CM | POA: Insufficient documentation

## 2024-03-24 DIAGNOSIS — Z923 Personal history of irradiation: Secondary | ICD-10-CM | POA: Diagnosis not present

## 2024-03-24 DIAGNOSIS — M85851 Other specified disorders of bone density and structure, right thigh: Secondary | ICD-10-CM | POA: Insufficient documentation

## 2024-03-24 DIAGNOSIS — Z9079 Acquired absence of other genital organ(s): Secondary | ICD-10-CM | POA: Diagnosis not present

## 2024-03-24 DIAGNOSIS — I714 Abdominal aortic aneurysm, without rupture, unspecified: Secondary | ICD-10-CM

## 2024-03-24 DIAGNOSIS — C7951 Secondary malignant neoplasm of bone: Secondary | ICD-10-CM | POA: Insufficient documentation

## 2024-03-24 LAB — CBC WITH DIFFERENTIAL (CANCER CENTER ONLY)
Abs Immature Granulocytes: 0.05 K/uL (ref 0.00–0.07)
Basophils Absolute: 0 K/uL (ref 0.0–0.1)
Basophils Relative: 1 %
Eosinophils Absolute: 0.1 K/uL (ref 0.0–0.5)
Eosinophils Relative: 1 %
HCT: 33 % — ABNORMAL LOW (ref 39.0–52.0)
Hemoglobin: 11.2 g/dL — ABNORMAL LOW (ref 13.0–17.0)
Immature Granulocytes: 1 %
Lymphocytes Relative: 8 %
Lymphs Abs: 0.6 K/uL — ABNORMAL LOW (ref 0.7–4.0)
MCH: 32.9 pg (ref 26.0–34.0)
MCHC: 33.9 g/dL (ref 30.0–36.0)
MCV: 97.1 fL (ref 80.0–100.0)
Monocytes Absolute: 0.7 K/uL (ref 0.1–1.0)
Monocytes Relative: 9 %
Neutro Abs: 5.7 K/uL (ref 1.7–7.7)
Neutrophils Relative %: 80 %
Platelet Count: 164 K/uL (ref 150–400)
RBC: 3.4 MIL/uL — ABNORMAL LOW (ref 4.22–5.81)
RDW: 13.4 % (ref 11.5–15.5)
WBC Count: 7 K/uL (ref 4.0–10.5)
nRBC: 0 % (ref 0.0–0.2)

## 2024-03-24 LAB — CMP (CANCER CENTER ONLY)
ALT: 11 U/L (ref 0–44)
AST: 13 U/L — ABNORMAL LOW (ref 15–41)
Albumin: 3.7 g/dL (ref 3.5–5.0)
Alkaline Phosphatase: 56 U/L (ref 38–126)
Anion gap: 7 (ref 5–15)
BUN: 12 mg/dL (ref 8–23)
CO2: 26 mmol/L (ref 22–32)
Calcium: 8.2 mg/dL — ABNORMAL LOW (ref 8.9–10.3)
Chloride: 102 mmol/L (ref 98–111)
Creatinine: 0.65 mg/dL (ref 0.61–1.24)
GFR, Estimated: >60 mL/min (ref >60)
Glucose, Bld: 184 mg/dL — ABNORMAL HIGH (ref 70–99)
Potassium: 3.8 mmol/L (ref 3.5–5.1)
Sodium: 135 mmol/L (ref 135–145)
Total Bilirubin: 0.7 mg/dL (ref 0.0–1.2)
Total Protein: 6.8 g/dL (ref 6.5–8.1)

## 2024-03-24 LAB — VITAMIN B12: Vitamin B-12: 515 pg/mL (ref 180–914)

## 2024-03-24 NOTE — Assessment & Plan Note (Addendum)
 Continue ADT (last in June 2025, 6 mo. Dose at [facility]) Continue darolutamide  (06/29/23). Supply via mail He is on low-dose simvastatin , no added toxicity. Referral for Pluvicto Return to see me with PSA, testosterone  in about 8 weeks History of radiation completed on L hip/pelvis 30 Gy/10 fx

## 2024-03-24 NOTE — Assessment & Plan Note (Addendum)
 baseline bone mineral density: 04/06/2023 Right Femur Neck  T= -1.8 osteopenia  calcium  (1000-1200 mg daily from food and supplements) and vitamin D3 (1000 IU daily) Control and prevent diabetes He has no teeth. He get [facility]  at [facility] monthly Recommend exercises and activities as able Limit alcohol consumption and avoid smoking Monitoring control blood pressure

## 2024-03-24 NOTE — Assessment & Plan Note (Addendum)
 Will repeat B12 level today.  Add MMA.

## 2024-03-25 LAB — POCT ACTIVATED CLOTTING TIME
Activated Clotting Time: 204 s
Activated Clotting Time: 227 s
Activated Clotting Time: 222 s
Activated Clotting Time: 239 s
Activated Clotting Time: 216 s
Activated Clotting Time: 222 s
Activated Clotting Time: 210 s

## 2024-03-25 LAB — PROSTATE-SPECIFIC AG, SERUM (LABCORP): Prostate Specific Ag, Serum: 6.9 ng/mL — ABNORMAL HIGH (ref 0.0–4.0)

## 2024-03-25 LAB — TESTOSTERONE: Testosterone: 21 ng/dL — ABNORMAL LOW (ref 264–916)

## 2024-03-26 ENCOUNTER — Encounter (HOSPITAL_COMMUNITY): Payer: Self-pay | Admitting: Vascular Surgery

## 2024-03-26 LAB — METHYLMALONIC ACID, SERUM: Methylmalonic Acid, Quantitative: 169 nmol/L (ref 0–378)

## 2024-03-27 ENCOUNTER — Telehealth: Payer: Self-pay

## 2024-03-27 ENCOUNTER — Emergency Department (HOSPITAL_COMMUNITY)
Admission: EM | Admit: 2024-03-27 | Discharge: 2024-03-27 | Discharge status: Against Medical Advice | Attending: Emergency Medicine | Admitting: Emergency Medicine

## 2024-03-27 DIAGNOSIS — K5900 Constipation, unspecified: Secondary | ICD-10-CM | POA: Insufficient documentation

## 2024-03-27 DIAGNOSIS — Z5321 Procedure and treatment not carried out due to patient leaving prior to being seen by health care provider: Secondary | ICD-10-CM | POA: Insufficient documentation

## 2024-03-27 NOTE — Telephone Encounter (Signed)
 Patient called (@ 1216) and left message reporting no BM since 03/20/24.  Called patient @ 1229, no answer, left message. Called patient @ 1257, no answer, left message with a woman who answered the phone Patient walked in to clinic @ 1356 and was advised to go to the ED.   03/28/24 @ 0853 Called pt to check on him. Pt reported he took milk of magnesia and was able to have a BM. Pt knows to drink water , eat fruits and vegetables, walk as tolerated, take Miralax  and stool softener as needed.

## 2024-03-27 NOTE — ED Notes (Signed)
 Pt reports I am not waiting any longer and pt left from ed

## 2024-03-27 NOTE — ED Triage Notes (Signed)
 Patient reports constipation since last Thursday when he had a procedure done. Patient has tried OTC meds with no relief.

## 2024-03-31 ENCOUNTER — Other Ambulatory Visit

## 2024-03-31 ENCOUNTER — Ambulatory Visit

## 2024-03-31 DIAGNOSIS — C7951 Secondary malignant neoplasm of bone: Secondary | ICD-10-CM | POA: Diagnosis not present

## 2024-03-31 DIAGNOSIS — C61 Malignant neoplasm of prostate: Secondary | ICD-10-CM | POA: Diagnosis not present

## 2024-03-31 DIAGNOSIS — R338 Other retention of urine: Secondary | ICD-10-CM | POA: Diagnosis not present

## 2024-03-31 NOTE — Progress Notes (Signed)
 Patient received Xgeva  120mg  on 9/22 at [facility].

## 2024-04-03 ENCOUNTER — Encounter (HOSPITAL_COMMUNITY)
Admission: RE | Admit: 2024-04-03 | Discharge: 2024-04-03 | Disposition: A | Discharge status: Home / Self Care | Source: Ambulatory Visit

## 2024-04-03 ENCOUNTER — Other Ambulatory Visit: Payer: Self-pay

## 2024-04-03 DIAGNOSIS — C61 Malignant neoplasm of prostate: Secondary | ICD-10-CM | POA: Insufficient documentation

## 2024-04-03 DIAGNOSIS — C7951 Secondary malignant neoplasm of bone: Secondary | ICD-10-CM | POA: Insufficient documentation

## 2024-04-03 NOTE — Progress Notes (Unsigned)
 Chief Complaint: Patient with metastatic castrate resistant prostate cancer. Evaluation for   Lu 177 PSMA therapy (Pluvicto).  Referring Physician(s):Chang    Patient Status: [facility] - Out-pt  History of Present Illness: David [name]. is a 78 y.o. male 78 year old male with castrate resistant metastatic prostate carcinoma.     Initial diagnosis February 2024 at time of TURP procedure for urinary retention.  Pathology revealed Gleason 4+5=9 prostate adenocarcinoma.  Patient with metastatic disease to the bones and base the bladder at initial diagnosis.     Previous treatment: Had one dose of docetaxel . Current Treatment: ADT, darolutamide  (started 06/29/2023).  Recent PSMA PET scan 03/14/2024 demonstrate multifocal skeletal metastasis.  PSA rising 2 6.9 from 4.8.    Recovering from recent AAA repair.   Past Medical History:  Diagnosis Date   AAA (abdominal aortic aneurysm)    Allergic rhinitis due to pollen    Anxiety state, unspecified    Asymptomatic varicose veins    Atherosclerosis of native arteries of the extremities, unspecified    Cataract 10/17/2016   L Eye   Contact dermatitis and other eczema due to other chemical products    Diaphragmatic hernia without mention of obstruction or gangrene    Elevated PSA    Flatulence, eructation, and gas pain    Headache(784.0)    Hemorrhoids, external    Herpes zoster with unspecified complication    Hyperpotassemia    Insomnia, unspecified    Lumbago    Lumbago    Myalgia and myositis, unspecified    Nocturia    Obesity, unspecified    Osteoarthrosis, unspecified whether generalized or localized, unspecified site    Other abnormal blood chemistry    Other headache syndromes(339.89)    Pre-diabetes    Pure hyperglyceridemia    Rash and other nonspecific skin eruption    Rotator cuff (capsule) sprain    Routine general medical examination at a health care facility    Tobacco use disorder    Unspecified  essential hypertension    Unspecified hypertrophic and atrophic condition of skin    Unspecified sleep apnea     Past Surgical History:  Procedure Laterality Date   ABDOMINAL AORTIC ENDOVASCULAR FENESTRATED STENT GRAFT Bilateral 03/20/2024   Procedure: ABDOMINAL AORTIC ENDOVASCULAR FENESTRATED STENT GRAFT, Stenting of Right Internal Iliac Artery,;  Surgeon: Pearline Norman RAMAN, MD;  Location: [facility] OR;  Service: Vascular;  Laterality: Bilateral;   CYSTOSCOPY N/A 08/22/2022   Procedure: PHYLLIS;  Surgeon: Rosalind Zachary NOVAK, MD;  Location: [facility];  Service: Urology;  Laterality: N/A;   DENTAL SURGERY  05/10/2021   Per patient   HEMORRHOID SURGERY     INSERTION OF ILIAC STENT Bilateral 03/20/2024   Procedure: INSERTION, STENT, ARTERY, ILIAC;  Surgeon: Pearline Norman RAMAN, MD;  Location: [facility] OR;  Service: Vascular;  Laterality: Bilateral;   PROSTATE BIOPSY     TRANSURETHRAL RESECTION OF PROSTATE N/A 08/22/2022   Procedure: TRANSURETHRAL RESECTION OF THE PROSTATE (TURP);  Surgeon: Rosalind Zachary NOVAK, MD;  Location: [facility];  Service: Urology;  Laterality: N/A;   tumorectomy Left    Removed tumor from his chest.   VARICOSE VEIN SURGERY      Allergies: Celebrex [celecoxib], Erythromycin, Oxycontin  [oxycodone  hcl], Penicillins, Tetracyclines & related, and Tape  Medications: Prior to Admission medications   Medication Sig Start Date End Date Taking? Authorizing Provider  acetaminophen  (TYLENOL ) 325 MG tablet Take 650 mg by mouth every 6 (six) hours as needed (pain  and sleep aid).    [provider]  aspirin  EC 81 MG tablet Take 1 tablet (81 mg total) by mouth daily at 6 (six) AM. Swallow whole. 03/22/24   Rhyne, Samantha Feleshia Zundel, PA-C  calcium -vitamin D (OSCAL WITH D) 500-5 MG-MCG tablet Take 1 tablet by mouth daily at 6 (six) AM. Patient not taking: Reported on 03/04/2024 03/03/24   Tina Pauletta BROCKS, MD  cetirizine  (ZYRTEC ) 10 MG tablet Take 1 tablet (10 mg  total) by mouth daily. 12/28/22   Fargo, Amy E, NP  clopidogrel  (PLAVIX ) 75 MG tablet Take 1 tablet (75 mg total) by mouth daily. 03/22/24   Rhyne, Samantha Daritza Brees, PA-C  darolutamide  (NUBEQA ) 300 MG tablet Take 2 tablets (600 mg total) by mouth 2 (two) times daily with a meal. 05/28/23   Tina Pauletta BROCKS, MD  finasteride  (PROSCAR ) 5 MG tablet TAKE 1 TABLET(5 MG) BY MOUTH DAILY 11/19/23   Eubanks, Jessica K, NP  HYDROcodone -acetaminophen  (NORCO/VICODIN) 5-325 MG tablet Take 1 tablet by mouth every 6 (six) hours as needed for moderate pain (pain score 4-6). 03/21/24   Rhyne, Samantha Maisee Vollman, PA-C  losartan  (COZAAR ) 100 MG tablet TAKE 1 TABLET BY MOUTH DAILY 05/28/23   Tina Pauletta BROCKS, MD  silodosin  (RAPAFLO ) 8 MG CAPS capsule Take 1 capsule (8 mg total) by mouth daily with breakfast. 01/16/24   Landy Barnie RAMAN, NP  simvastatin  (ZOCOR ) 20 MG tablet TAKE 1 TABLET(20 MG) BY MOUTH DAILY AT 6 PM 03/17/24   Fargo, Amy E, NP  traZODone  (DESYREL ) 150 MG tablet Take 1 tablet (150 mg total) by mouth at bedtime. 01/16/24   Landy Barnie RAMAN, NP     Family History  Problem Relation Age of Onset   Diabetes Mother    Heart disease Mother    Stroke Mother    Stroke Father    Leukemia Maternal Aunt        dx >50   Breast cancer Paternal Aunt        dx >50   Stomach cancer Cousin        d. 2; pat male cousin    Social History   Socioeconomic History   Marital status: Married    Spouse name: Not on file   Number of children: Not on file   Years of education: Not on file   Highest education level: Not on file  Occupational History   Not on file  Tobacco Use   Smoking status: Former    Current packs/day: 0.00    Average packs/day: 1.5 packs/day for 55.0 years (82.5 ttl pk-yrs)    Types: Cigarettes    Start date: 07/29/1960    Quit date: 07/30/2015    Years since quitting: 8.6   Smokeless tobacco: Never   Tobacco comments:    pt thinks increased effexor  dose helped him to quit  Vaping Use   Vaping status: Never  Used  Substance and Sexual Activity   Alcohol use: No    Alcohol/week: 0.0 standard drinks of alcohol   Drug use: No   Sexual activity: Not Currently  Other Topics Concern   Not on file  Social History Narrative   Married   Stopped smoking 07/30/15   Alcohol none   Exercise none   [employer] [phone] 1987    Social Drivers of Health   Financial Resource Strain: Medium Risk (11/08/2023)   Overall Financial Resource Strain (CARDIA)    Difficulty of Paying Living Expenses: Somewhat hard  Food Insecurity: No Food Insecurity (  03/20/2024)   Hunger Vital Sign    Worried About Running Out of Food in the Last Year: Never true    Ran Out of Food in the Last Year: Never true  Transportation Needs: No Transportation Needs (03/20/2024)   PRAPARE - Administrator, Civil Service (Medical): No    Lack of Transportation (Non-Medical): No  Physical Activity: Inactive (11/08/2023)   Exercise Vital Sign    Days of Exercise per Week: 0 days    Minutes of Exercise per Session: 0 min  Stress: No Stress Concern Present (11/08/2023)   [employer] of Occupational Health - Occupational Stress Questionnaire    Feeling of Stress : Only a little  Social Connections: Unknown (03/20/2024)   Social Connection and Isolation Panel    Frequency of Communication with Friends and Family: Once a week    Frequency of Social Gatherings with Friends and Family: Once a week    Attends Religious Services: Never    Database administrator or Organizations: No    Attends Engineer, structural: Never    Marital Status: Not on file    ECOG Status: 0 - Asymptomatic  Review of Systems: A 12 point ROS discussed and pertinent positives are indicated in the HPI above.  All other systems are negative.  Review of Systems  Gastrointestinal:  Positive for constipation.  Genitourinary:  Positive for difficulty urinating.       Chronic Foley catheter  Musculoskeletal: Negative.        No bone pain       Vital Signs: There were no vitals taken for this visit.  Physical Exam Constitutional:      Appearance: Normal appearance. He is normal weight.  Genitourinary:    Comments: Foley catheter Neurological:     Mental Status: He is alert.  Psychiatric:        Behavior: Behavior normal.        Thought Content: Thought content normal.        Judgment: Judgment normal.     Imaging: No results found.   MPRESSION: Decreased size of prostate gland, but with persistent diffuse radiotracer uptake, consistent with residual prostate carcinoma.  Resolution of radiotracer avid pelvic lymph node metastases.  Resolution of solitary right upper lobe radiotracer avid pulmonary nodule.  Mixed response of radiotracer avid sclerotic bone metastases.  Labs:  Recent Labs    12/12/23 0802 02/01/24 0805 03/03/24 0804 03/24/24 0957  PSA1 1.5 3.3 4.8* 6.9*    CBC: Recent Labs    03/03/24 0804 03/20/24 1510 03/21/24 0510 03/24/24 0957  WBC 4.1 6.2 6.3 7.0  HGB 12.9* 10.7* 10.4* 11.2*  HCT 38.5* 31.6* 30.7* 33.0*  PLT 179 129* 132* 164    COAGS: Recent Labs    03/06/24 0854 03/20/24 1510  INR 1.0 1.1  APTT 32 34    BMP: Recent Labs    03/03/24 0804 03/20/24 1510 03/21/24 0510 03/24/24 0957  NA 139 135 137 135  K 4.1 4.3 3.6 3.8  CL 107 104 104 102  CO2 26 24 23 26   GLUCOSE 148* 146* 112* 184*  BUN 18 9 9 12   CALCIUM  8.7* 8.0* 7.8* 8.2*  CREATININE 0.66 0.57* 0.64 0.65  GFRNONAA >60 >60 >60 >60    LIVER FUNCTION TESTS: Recent Labs    01/04/24 1152 02/01/24 0805 03/03/24 0804 03/24/24 0957  BILITOT 1.1 0.5 0.4 0.7  AST 21 14* 16 13*  ALT 15 13 13 11   ALKPHOS 52 58  60 56  PROT 7.7 6.9 6.7 6.8  ALBUMIN  4.3 3.9 4.2 3.7    TUMOR MARKERS: No results for input(s): AFPTM, CEA, CA199, CHROMOGA in the last 8760 hours.  Assessment and Plan:  Patient is adequate candidate Lu 177 PSMA therapy ( vipivotide tetraxetan).  Patient demonstrates  mild-to-moderate progression metastatic [skeletal disease] identified on recent PSMA PET scan.  Additionally patient's PSA is gradually increasing.  Patient has demonstrated progression on androgen deprivation and one dose taxane chemotherapy.   Patient explained major and minor risks and benefits of therapy.  Major benefit being progression-free survival.  Major risk being myelosuppression and renal toxicity. Minor toxicity of xerostomia.  All the patient's questions were answered.  Foley catheter management reviewed.    Patient is scheduled for 6 treatments spaced 6 weeks apart.  Recommend following up with oncologist for CBC and CMP 1 week prior to each treatment to assess safety of continuing with therapy.      Thank you for this interesting consult.  I greatly enjoyed meeting Vasilis Luhman. and look forward to participating in their care.  A copy of this report was sent to the requesting provider on this date.  Electronically Signed: Norleen GORMAN Boxer, MD 04/03/2024, 11:47 AM   I spent a total of  30 Minutes   in face to face in clinical consultation, greater than 50% of which was counseling/coordinating care for metastatic neuroendocrine tumor.

## 2024-04-11 ENCOUNTER — Other Ambulatory Visit: Payer: Self-pay | Admitting: Vascular Surgery

## 2024-04-11 MED ORDER — DIAZEPAM 5 MG PO TABS: 5.0000 mg | ORAL_TABLET | Freq: Once | ORAL | 0 refills | Status: AC

## 2024-04-13 ENCOUNTER — Other Ambulatory Visit: Payer: Self-pay | Admitting: Adult Health

## 2024-04-14 ENCOUNTER — Other Ambulatory Visit: Payer: Self-pay

## 2024-04-14 ENCOUNTER — Encounter: Payer: Self-pay | Admitting: Nurse Practitioner

## 2024-04-14 ENCOUNTER — Ambulatory Visit (HOSPITAL_COMMUNITY)
Admission: RE | Admit: 2024-04-14 | Discharge: 2024-04-14 | Disposition: A | Discharge status: Home / Self Care | Source: Ambulatory Visit | Attending: Vascular Surgery | Admitting: Vascular Surgery

## 2024-04-14 ENCOUNTER — Inpatient Hospital Stay: Admitting: Nurse Practitioner

## 2024-04-14 ENCOUNTER — Telehealth: Payer: Self-pay

## 2024-04-14 VITALS — BP 132/79 | HR 76 | Temp 97.8°F | Resp 18 | Ht 69.0 in | Wt 206.8 lb

## 2024-04-14 DIAGNOSIS — C61 Malignant neoplasm of prostate: Secondary | ICD-10-CM

## 2024-04-14 DIAGNOSIS — G893 Neoplasm related pain (acute) (chronic): Secondary | ICD-10-CM | POA: Diagnosis not present

## 2024-04-14 DIAGNOSIS — I714 Abdominal aortic aneurysm, without rupture, unspecified: Secondary | ICD-10-CM | POA: Insufficient documentation

## 2024-04-14 DIAGNOSIS — Y831 Surgical operation with implant of artificial internal device as the cause of abnormal reaction of the patient, or of later complication, without mention of misadventure at the time of the procedure: Secondary | ICD-10-CM | POA: Insufficient documentation

## 2024-04-14 DIAGNOSIS — I723 Aneurysm of iliac artery: Secondary | ICD-10-CM | POA: Diagnosis not present

## 2024-04-14 DIAGNOSIS — I9789 Other postprocedural complications and disorders of the circulatory system, not elsewhere classified: Secondary | ICD-10-CM | POA: Insufficient documentation

## 2024-04-14 DIAGNOSIS — C801 Malignant (primary) neoplasm, unspecified: Secondary | ICD-10-CM | POA: Diagnosis not present

## 2024-04-14 DIAGNOSIS — K769 Liver disease, unspecified: Secondary | ICD-10-CM | POA: Insufficient documentation

## 2024-04-14 DIAGNOSIS — C7951 Secondary malignant neoplasm of bone: Secondary | ICD-10-CM

## 2024-04-14 DIAGNOSIS — R530 Neoplastic (malignant) related fatigue: Secondary | ICD-10-CM

## 2024-04-14 DIAGNOSIS — Z515 Encounter for palliative care: Secondary | ICD-10-CM

## 2024-04-14 DIAGNOSIS — I7143 Infrarenal abdominal aortic aneurysm, without rupture: Secondary | ICD-10-CM | POA: Diagnosis not present

## 2024-04-14 MED ORDER — IOHEXOL 350 MG/ML SOLN
75.0000 mL | Freq: Once | INTRAVENOUS | Status: AC | PRN
  Administered 2024-04-14: 75 mL via INTRAVENOUS

## 2024-04-14 NOTE — Progress Notes (Signed)
 Palliative Medicine [facility]  Telephone:([phone]) [phone] [phone]) [phone]   Name: David [name]. Date: 04/14/2024 MRN: [id number]  DOB: [birth year]  Patient Care Team: Gil Greig BRAVO, NP as PCP - General (Adult Health Nurse Practitioner) Charmayne Molly, MD as Consulting Physician (Ophthalmology) Gil Greig BRAVO, NP as Nurse Practitioner (Adult Health Nurse Practitioner) Vertell Pont, RN as Oncology Nurse Navigator    INTERVAL HISTORY: David [name]. is a 78 y.o. male with oncologic medical history including metastatic prostate cancer with lymph node, lung, and bone involvement.  Palliative is seeing patient for symptom management and goals of care.   SOCIAL HISTORY:     reports that he quit smoking about 8 years ago. His smoking use included cigarettes. He started smoking about 63 years ago. He has a 82.5 pack-year smoking history. He has never used smokeless tobacco. He reports that he does not drink alcohol and does not use drugs.  ADVANCE DIRECTIVES:  None on file   CODE STATUS: Full code  PAST MEDICAL HISTORY: Past Medical History:  Diagnosis Date   AAA (abdominal aortic aneurysm)    Allergic rhinitis due to pollen    Anxiety state, unspecified    Asymptomatic varicose veins    Atherosclerosis of native arteries of the extremities, unspecified    Cataract 10/17/2016   L Eye   Contact dermatitis and other eczema due to other chemical products    Diaphragmatic hernia without mention of obstruction or gangrene    Elevated PSA    Flatulence, eructation, and gas pain    Headache(784.0)    Hemorrhoids, external    Herpes zoster with unspecified complication    Hyperpotassemia    Insomnia, unspecified    Lumbago    Lumbago    Myalgia and myositis, unspecified    Nocturia    Obesity, unspecified    Osteoarthrosis, unspecified whether generalized or localized, unspecified site    Other abnormal blood chemistry    Other headache  syndromes(339.89)    Pre-diabetes    Pure hyperglyceridemia    Rash and other nonspecific skin eruption    Rotator cuff (capsule) sprain    Routine general medical examination at a health care facility    Tobacco use disorder    Unspecified essential hypertension    Unspecified hypertrophic and atrophic condition of skin    Unspecified sleep apnea     ALLERGIES:  is allergic to celebrex [celecoxib], erythromycin, oxycontin  [oxycodone  hcl], penicillins, tetracyclines & related, and tape.  MEDICATIONS:  Current Outpatient Medications  Medication Sig Dispense Refill   acetaminophen  (TYLENOL ) 325 MG tablet Take 650 mg by mouth every 6 (six) hours as needed (pain and sleep aid).     aspirin  EC 81 MG tablet Take 1 tablet (81 mg total) by mouth daily at 6 (six) AM. Swallow whole. 30 tablet 12   calcium -vitamin D (OSCAL WITH D) 500-5 MG-MCG tablet Take 1 tablet by mouth daily at 6 (six) AM. (Patient not taking: Reported on 03/04/2024) 90 tablet 1   cetirizine  (ZYRTEC ) 10 MG tablet Take 1 tablet (10 mg total) by mouth daily. 30 tablet 11   clopidogrel  (PLAVIX ) 75 MG tablet Take 1 tablet (75 mg total) by mouth daily. 30 tablet 11   darolutamide  (NUBEQA ) 300 MG tablet Take 2 tablets (600 mg total) by mouth 2 (two) times daily with a meal. 120 tablet 11   finasteride  (PROSCAR ) 5 MG tablet TAKE 1 TABLET(5 MG) BY MOUTH DAILY 90 tablet 1  HYDROcodone -acetaminophen  (NORCO/VICODIN) 5-325 MG tablet Take 1 tablet by mouth every 6 (six) hours as needed for moderate pain (pain score 4-6). 8 tablet 0   losartan  (COZAAR ) 100 MG tablet TAKE 1 TABLET BY MOUTH DAILY 90 tablet 3   silodosin  (RAPAFLO ) 8 MG CAPS capsule TAKE 1 CAPSULE(8 MG) BY MOUTH DAILY WITH BREAKFAST 30 capsule 3   simvastatin  (ZOCOR ) 20 MG tablet TAKE 1 TABLET(20 MG) BY MOUTH DAILY AT 6 PM 90 tablet 1   traZODone  (DESYREL ) 150 MG tablet Take 1 tablet (150 mg total) by mouth at bedtime. 90 tablet 2   No current facility-administered medications  for this visit.    VITAL SIGNS: BP 132/79 (BP Location: Right Arm, Patient Position: Sitting)   Pulse 76   Temp 97.8 F (36.6 C) (Temporal)   Resp 18   Ht 5' 9 (1.753 m)   Wt 206 lb 12.8 oz (93.8 kg)   SpO2 95%   BMI 30.54 kg/m  Filed Weights   04/14/24 1017  Weight: 206 lb 12.8 oz (93.8 kg)    Estimated body mass index is 30.54 kg/m as calculated from the following:   Height as of this encounter: 5' 9 (1.753 m).   Weight as of this encounter: 206 lb 12.8 oz (93.8 kg).   PERFORMANCE STATUS (ECOG) : 1 - Symptomatic but completely ambulatory   Physical Exam General: NAD Cardiovascular: regular rate and rhythm Pulmonary: normal breathing pattern Extremities: no edema, no joint deformities Skin: no rashes Neurological: AAO x3  IMPRESSION: Discussed the use of AI scribe software for clinical note transcription with the patient, who gave verbal consent to proceed.  History of Present Illness David [name]. is a 78 year old male with metastatic prostate cancer who presents for follow-up. No family present. Patient recently hospitalized for on 9/11 were he underwent bilateral renal artery stenting amongst other vascular procedures.   Reports overall he has been doing well since discharge and healing. Reports he still has sutures in place. He has a follow-up appointment on the 10th and is scheduled for a CT scan today at 1:45 PM at a location on [address] in [place].  He is undergoing Plavicto treatment with an appointment on the 23rd at 2 PM.   David [name] reports he experiences constipation that 'comes and goes' and had a bowel movement last night. He has hydrocodone  for pain but is reluctant to use it due to constipation concerns. He typically uses Tylenol , taking no more than 500 mg at a time. Endorses pain at times is not controlled. Education provided on use of hydrocodone  as needed with use of stool softeners to prevent constipation. He verbalized  understanding as he was not using a bowel regimen in the past.   His appetite remains unchanged, but he has lost weight, now weighing 206 pounds, down from 214 pounds. He eats two meals a day and does not consume Ensure due to cost concerns. Patient provided with Ensure samples.   David [name] speaks to caring for his wife who has dementia, and mentions having two daughters, one living in [place]  and the other in [place], whom he hasn't seen in 25 years. I empathetically approached discussions regarding conversations to consider additional help in caring for his wife and care needs for the future. He verbalized understanding.   All questions answered and support provided.  Assessment & Plan Prostate cancer Prostate cancer is being actively managed with Plavicto treatment per Dr. Tina  - Continue Plavicto treatment  as scheduled on October 23rd at 2 PM.  Cancer Pain Chronic pain is present, but he is reluctant to use hydrocodone  due to its constipating effects. He prefers using Tylenol  for pain management. - Advise using Tylenol  for pain management, up to two 500 mg tablets as needed. - Offer additional hydrocodone  if needed, with the recommendation to use a stool softener.  Constipation Constipation is reported to be intermittent. He is aware that hydrocodone  can exacerbate constipation and is cautious about using it. - Advise taking hydrocodone  with a stool softener to prevent constipation. - Offer Miralax  stool softener from samples available.  -I will plan to follow-up with patient in 3-4 weeks - Check for Ensure samples to provide to him.  Patient expressed understanding and was in agreement with this plan. He also understands that He can call the clinic at any time with any questions, concerns, or complaints.   Any controlled substances utilized were prescribed in the context of palliative care. PDMP has been reviewed.   Visit consisted of counseling and education dealing  with the complex and emotionally intense issues of symptom management and palliative care in the setting of serious and potentially life-threatening illness.  Levon Borer, AGPCNP-BC  Palliative Medicine Team/[facility]

## 2024-04-14 NOTE — Telephone Encounter (Signed)
 I returned David [name]'s call from a VM he left on 9/30. I asked David [name] which appointment he would like to re-schedule. We went through each appointment currently scheduled for him and I informed him that he has a Palliative care appointment scheduled for today. He stated that he was unaware of the appointment but he will be here. I also informed him that for his other appointments outside of my office he would need to call their office to re-schedule.

## 2024-04-16 NOTE — Progress Notes (Unsigned)
 Patient ID: David Petion., male   DOB: 1945-10-17, 78 y.o.   MRN: [id number]  Reason for Consult: Routine Post Op   Referred by Gil Greig BRAVO, NP  Subjective:     HPI David Botero. is a 78 y.o. male presenting for follow-up.  On 03/20/2024 he underwent two-vessel fenestrated endovascular repair with a right iliac branch device for a AAA and a right common iliac artery aneurysm.  He reports he recovered well and his groin access sites have healed okay.  He denies any new or unusual abdominal or back pain.  He is compliant with aspirin  and Plavix .  Past Medical History:  Diagnosis Date   AAA (abdominal aortic aneurysm)    Allergic rhinitis due to pollen    Anxiety state, unspecified    Asymptomatic varicose veins    Atherosclerosis of native arteries of the extremities, unspecified    Cataract 10/17/2016   L Eye   Contact dermatitis and other eczema due to other chemical products    Diaphragmatic hernia without mention of obstruction or gangrene    Elevated PSA    Flatulence, eructation, and gas pain    Headache(784.0)    Hemorrhoids, external    Herpes zoster with unspecified complication    Hyperpotassemia    Insomnia, unspecified    Lumbago    Lumbago    Myalgia and myositis, unspecified    Nocturia    Obesity, unspecified    Osteoarthrosis, unspecified whether generalized or localized, unspecified site    Other abnormal blood chemistry    Other headache syndromes(339.89)    Pre-diabetes    Pure hyperglyceridemia    Rash and other nonspecific skin eruption    Rotator cuff (capsule) sprain    Routine general medical examination at a health care facility    Tobacco use disorder    Unspecified essential hypertension    Unspecified hypertrophic and atrophic condition of skin    Unspecified sleep apnea    Family History  Problem Relation Age of Onset   Diabetes Mother    Heart disease Mother    Stroke Mother    Stroke Father    Leukemia Maternal Aunt         dx >50   Breast cancer Paternal Aunt        dx >50   Stomach cancer Cousin        d. 73; pat male cousin   Past Surgical History:  Procedure Laterality Date   ABDOMINAL AORTIC ENDOVASCULAR FENESTRATED STENT GRAFT Bilateral 03/20/2024   Procedure: ABDOMINAL AORTIC ENDOVASCULAR FENESTRATED STENT GRAFT, Stenting of Right Internal Iliac Artery,;  Surgeon: Pearline David RAMAN, MD;  Location: [facility] OR;  Service: Vascular;  Laterality: Bilateral;   CYSTOSCOPY N/A 08/22/2022   Procedure: CYSTOSCOPY;  Surgeon: Rosalind Zachary NOVAK, MD;  Location: [facility];  Service: Urology;  Laterality: N/A;   DENTAL SURGERY  05/10/2021   Per patient   HEMORRHOID SURGERY     INSERTION OF ILIAC STENT Bilateral 03/20/2024   Procedure: INSERTION, STENT, ARTERY, ILIAC;  Surgeon: Pearline David RAMAN, MD;  Location: [facility] OR;  Service: Vascular;  Laterality: Bilateral;   PROSTATE BIOPSY     TRANSURETHRAL RESECTION OF PROSTATE N/A 08/22/2022   Procedure: TRANSURETHRAL RESECTION OF THE PROSTATE (TURP);  Surgeon: Rosalind Zachary NOVAK, MD;  Location[facility];  Service: Urology;  Laterality: N/A;   tumorectomy Left    Removed tumor from his chest.   VARICOSE VEIN SURGERY  Short Social History:  Social History   Tobacco Use   Smoking status: Former    Current packs/day: 0.00    Average packs/day: 1.5 packs/day for 55.0 years (82.5 ttl pk-yrs)    Types: Cigarettes    Start date: 07/29/1960    Quit date: 07/30/2015    Years since quitting: 8.7   Smokeless tobacco: Never   Tobacco comments:    pt thinks increased effexor  dose helped him to quit  Substance Use Topics   Alcohol use: No    Alcohol/week: 0.0 standard drinks of alcohol    Allergies  Allergen Reactions   Celebrex [Celecoxib] Hives and Shortness Of Breath   Erythromycin Hives and Shortness Of Breath   Oxycontin  [Oxycodone  Hcl] Hives and Shortness Of Breath   Penicillins Hives and Shortness Of Breath   Tetracyclines &  Related Hives and Shortness Of Breath   Tape Rash    Current Outpatient Medications  Medication Sig Dispense Refill   acetaminophen  (TYLENOL ) 325 MG tablet Take 650 mg by mouth every 6 (six) hours as needed (pain and sleep aid).     aspirin  EC 81 MG tablet Take 1 tablet (81 mg total) by mouth daily at 6 (six) AM. Swallow whole. 30 tablet 12   cetirizine  (ZYRTEC ) 10 MG tablet Take 1 tablet (10 mg total) by mouth daily. 30 tablet 11   clopidogrel  (PLAVIX ) 75 MG tablet Take 1 tablet (75 mg total) by mouth daily. 30 tablet 11   darolutamide  (NUBEQA ) 300 MG tablet Take 2 tablets (600 mg total) by mouth 2 (two) times daily with a meal. 120 tablet 11   finasteride  (PROSCAR ) 5 MG tablet TAKE 1 TABLET(5 MG) BY MOUTH DAILY 90 tablet 1   HYDROcodone -acetaminophen  (NORCO/VICODIN) 5-325 MG tablet Take 1 tablet by mouth every 6 (six) hours as needed for moderate pain (pain score 4-6). 8 tablet 0   losartan  (COZAAR ) 100 MG tablet TAKE 1 TABLET BY MOUTH DAILY 90 tablet 3   silodosin  (RAPAFLO ) 8 MG CAPS capsule TAKE 1 CAPSULE(8 MG) BY MOUTH DAILY WITH BREAKFAST 30 capsule 3   simvastatin  (ZOCOR ) 20 MG tablet TAKE 1 TABLET(20 MG) BY MOUTH DAILY AT 6 PM 90 tablet 1   traZODone  (DESYREL ) 150 MG tablet Take 1 tablet (150 mg total) by mouth at bedtime. 90 tablet 2   calcium -vitamin D (OSCAL WITH D) 500-5 MG-MCG tablet Take 1 tablet by mouth daily at 6 (six) AM. (Patient not taking: Reported on 04/18/2024) 90 tablet 1   No current facility-administered medications for this visit.    REVIEW OF SYSTEMS  All other systems were reviewed and are negative     Objective:  Objective   Vitals:   04/18/24 1020  BP: 119/73  Pulse: 85  Resp: 20  Temp: 97.9 F (36.6 C)  TempSrc: Temporal  SpO2: 98%  Weight: 208 lb (94.3 kg)  Height: 5' 9 (1.753 m)   Body mass index is 30.72 kg/m.  Physical Exam General: no acute distress Cardiac: hemodynamically stable Pulm: normal work of breathing Neuro: alert, no  focal deficit Extremities: no edema, cyanosis or wounds Vascular:   Right: Multiphasic DP, PT  Left: Multiphasic DP, PT  Data: CTA independently reviewed. The right common iliac artery aneurysm and AAA appeared sealed without any evidence of a type I or III endoleak.  The SMA is widely patent.  The right and left renal stents appear widely patent.     Assessment/Plan:   David Sullenberger. is a 78 y.o.  male with history of AAA and a right common iliac artery aneurysm.  This was treated with a 2 vessel fenestrated endovascular aortic repair with a iliac branch device on 03/20/2024.  The CTA today demonstrates widely patent visceral segment and excellent seal without evidence of a type I or III endoleak.  There may be a small type II endoleak from the IMA and lumbar arteries.  The excluded aneurysms are similar in size compared to preop.  Will plan for follow-up in 6 months with a repeat CTA abdomen pelvis. He will need a prescription for a one-time 5 mg dose of Valium at the time of his CTA Continue aspirin  Plavix   David GORMAN Serve MD [facility]

## 2024-04-17 ENCOUNTER — Telehealth: Payer: Self-pay | Admitting: Nurse Practitioner

## 2024-04-17 ENCOUNTER — Ambulatory Visit: Admitting: Orthopedic Surgery

## 2024-04-17 NOTE — Telephone Encounter (Signed)
 Scheduled patient for next palliative appointment. Called and spoke with the patient and he is aware.

## 2024-04-18 ENCOUNTER — Encounter: Payer: Self-pay | Admitting: Vascular Surgery

## 2024-04-18 ENCOUNTER — Ambulatory Visit: Attending: Vascular Surgery | Admitting: Vascular Surgery

## 2024-04-18 VITALS — BP 119/73 | HR 85 | Temp 97.9°F | Resp 20 | Ht 69.0 in | Wt 208.0 lb

## 2024-04-18 DIAGNOSIS — I723 Aneurysm of iliac artery: Secondary | ICD-10-CM

## 2024-04-18 DIAGNOSIS — I7142 Juxtarenal abdominal aortic aneurysm, without rupture: Secondary | ICD-10-CM

## 2024-04-24 ENCOUNTER — Other Ambulatory Visit (HOSPITAL_COMMUNITY): Payer: Self-pay

## 2024-04-24 ENCOUNTER — Ambulatory Visit: Admitting: Orthopedic Surgery

## 2024-04-24 ENCOUNTER — Other Ambulatory Visit: Payer: Self-pay

## 2024-04-25 ENCOUNTER — Other Ambulatory Visit (HOSPITAL_COMMUNITY): Payer: Self-pay

## 2024-04-30 ENCOUNTER — Ambulatory Visit: Payer: Self-pay

## 2024-04-30 ENCOUNTER — Other Ambulatory Visit: Payer: Self-pay | Admitting: *Deleted

## 2024-04-30 ENCOUNTER — Inpatient Hospital Stay

## 2024-04-30 DIAGNOSIS — C61 Malignant neoplasm of prostate: Secondary | ICD-10-CM

## 2024-04-30 DIAGNOSIS — C7951 Secondary malignant neoplasm of bone: Secondary | ICD-10-CM | POA: Insufficient documentation

## 2024-04-30 DIAGNOSIS — Z87891 Personal history of nicotine dependence: Secondary | ICD-10-CM | POA: Insufficient documentation

## 2024-04-30 DIAGNOSIS — G893 Neoplasm related pain (acute) (chronic): Secondary | ICD-10-CM | POA: Insufficient documentation

## 2024-04-30 DIAGNOSIS — Z79891 Long term (current) use of opiate analgesic: Secondary | ICD-10-CM | POA: Insufficient documentation

## 2024-04-30 DIAGNOSIS — K5900 Constipation, unspecified: Secondary | ICD-10-CM | POA: Insufficient documentation

## 2024-04-30 DIAGNOSIS — H52203 Unspecified astigmatism, bilateral: Secondary | ICD-10-CM | POA: Diagnosis not present

## 2024-04-30 DIAGNOSIS — H04123 Dry eye syndrome of bilateral lacrimal glands: Secondary | ICD-10-CM | POA: Diagnosis not present

## 2024-04-30 DIAGNOSIS — R634 Abnormal weight loss: Secondary | ICD-10-CM | POA: Diagnosis not present

## 2024-04-30 DIAGNOSIS — H26491 Other secondary cataract, right eye: Secondary | ICD-10-CM | POA: Diagnosis not present

## 2024-04-30 DIAGNOSIS — Z961 Presence of intraocular lens: Secondary | ICD-10-CM | POA: Diagnosis not present

## 2024-04-30 LAB — CBC WITH DIFFERENTIAL (CANCER CENTER ONLY)
Abs Immature Granulocytes: 0.02 K/uL (ref 0.00–0.07)
Basophils Absolute: 0 K/uL (ref 0.0–0.1)
Basophils Relative: 1 %
Eosinophils Absolute: 0.1 K/uL (ref 0.0–0.5)
Eosinophils Relative: 1 %
HCT: 36.2 % — ABNORMAL LOW (ref 39.0–52.0)
Hemoglobin: 12.2 g/dL — ABNORMAL LOW (ref 13.0–17.0)
Immature Granulocytes: 0 %
Lymphocytes Relative: 20 %
Lymphs Abs: 0.9 K/uL (ref 0.7–4.0)
MCH: 32.4 pg (ref 26.0–34.0)
MCHC: 33.7 g/dL (ref 30.0–36.0)
MCV: 96.3 fL (ref 80.0–100.0)
Monocytes Absolute: 0.5 K/uL (ref 0.1–1.0)
Monocytes Relative: 10 %
Neutro Abs: 3.1 K/uL (ref 1.7–7.7)
Neutrophils Relative %: 68 %
Platelet Count: 219 K/uL (ref 150–400)
RBC: 3.76 MIL/uL — ABNORMAL LOW (ref 4.22–5.81)
RDW: 13.9 % (ref 11.5–15.5)
WBC Count: 4.6 K/uL (ref 4.0–10.5)
nRBC: 0 % (ref 0.0–0.2)

## 2024-04-30 LAB — CMP (CANCER CENTER ONLY)
ALT: 11 U/L (ref 0–44)
AST: 17 U/L (ref 15–41)
Albumin: 4.2 g/dL (ref 3.5–5.0)
Alkaline Phosphatase: 74 U/L (ref 38–126)
Anion gap: 6 (ref 5–15)
BUN: 18 mg/dL (ref 8–23)
CO2: 29 mmol/L (ref 22–32)
Calcium: 9.8 mg/dL (ref 8.9–10.3)
Chloride: 104 mmol/L (ref 98–111)
Creatinine: 0.74 mg/dL (ref 0.61–1.24)
GFR, Estimated: >60 mL/min (ref >60)
Glucose, Bld: 120 mg/dL — ABNORMAL HIGH (ref 70–99)
Potassium: 5 mmol/L (ref 3.5–5.1)
Sodium: 139 mmol/L (ref 135–145)
Total Bilirubin: 0.5 mg/dL (ref 0.0–1.2)
Total Protein: 7.7 g/dL (ref 6.5–8.1)

## 2024-04-30 LAB — PSA: Prostatic Specific Antigen: 12.56 ng/mL — ABNORMAL HIGH (ref 0.00–4.00)

## 2024-05-01 ENCOUNTER — Ambulatory Visit (INDEPENDENT_AMBULATORY_CARE_PROVIDER_SITE_OTHER): Admitting: Orthopedic Surgery

## 2024-05-01 ENCOUNTER — Encounter: Payer: Self-pay | Admitting: Orthopedic Surgery

## 2024-05-01 ENCOUNTER — Encounter (HOSPITAL_COMMUNITY)
Admission: RE | Admit: 2024-05-01 | Discharge: 2024-05-01 | Disposition: A | Discharge status: Home / Self Care | Source: Ambulatory Visit

## 2024-05-01 VITALS — BP 130/72 | HR 62 | Temp 97.4°F | Ht 69.0 in | Wt 210.8 lb

## 2024-05-01 DIAGNOSIS — Z23 Encounter for immunization: Secondary | ICD-10-CM | POA: Diagnosis not present

## 2024-05-01 DIAGNOSIS — I7143 Infrarenal abdominal aortic aneurysm, without rupture: Secondary | ICD-10-CM

## 2024-05-01 DIAGNOSIS — R339 Retention of urine, unspecified: Secondary | ICD-10-CM | POA: Diagnosis not present

## 2024-05-01 DIAGNOSIS — R7303 Prediabetes: Secondary | ICD-10-CM | POA: Diagnosis not present

## 2024-05-01 DIAGNOSIS — C61 Malignant neoplasm of prostate: Secondary | ICD-10-CM | POA: Insufficient documentation

## 2024-05-01 DIAGNOSIS — F4321 Adjustment disorder with depressed mood: Secondary | ICD-10-CM | POA: Diagnosis not present

## 2024-05-01 DIAGNOSIS — I10 Essential (primary) hypertension: Secondary | ICD-10-CM | POA: Diagnosis not present

## 2024-05-01 DIAGNOSIS — R338 Other retention of urine: Secondary | ICD-10-CM

## 2024-05-01 DIAGNOSIS — E782 Mixed hyperlipidemia: Secondary | ICD-10-CM

## 2024-05-01 DIAGNOSIS — N401 Enlarged prostate with lower urinary tract symptoms: Secondary | ICD-10-CM

## 2024-05-01 DIAGNOSIS — C7951 Secondary malignant neoplasm of bone: Secondary | ICD-10-CM

## 2024-05-01 DIAGNOSIS — R635 Abnormal weight gain: Secondary | ICD-10-CM

## 2024-05-01 LAB — HEMOGLOBIN A1C
Hgb A1c MFr Bld: 5.9 % — ABNORMAL HIGH (ref <5.7)
Mean Plasma Glucose: 123 mg/dL
eAG (mmol/L): 6.8 mmol/L

## 2024-05-01 LAB — TSH: TSH: 2.12 m[IU]/L (ref 0.40–4.50)

## 2024-05-01 MED ORDER — SODIUM CHLORIDE 0.9 % IV BOLUS: 1000.0000 mL | Freq: Once | INTRAVENOUS | Status: DC

## 2024-05-01 MED ORDER — ONDANSETRON HCL 4 MG PO TABS: 4.0000 mg | ORAL_TABLET | Freq: Three times a day (TID) | ORAL | 0 refills | Status: AC | PRN

## 2024-05-01 MED ORDER — FINASTERIDE 5 MG PO TABS: 5.0000 mg | ORAL_TABLET | Freq: Every day | ORAL | 2 refills | Status: AC

## 2024-05-01 MED ORDER — LOSARTAN POTASSIUM 100 MG PO TABS: ORAL_TABLET | ORAL | 3 refills | Status: AC

## 2024-05-01 MED ORDER — TRAZODONE HCL 150 MG PO TABS: 150.0000 mg | ORAL_TABLET | Freq: Every day | ORAL | 2 refills | Status: AC

## 2024-05-01 MED ORDER — SILODOSIN 8 MG PO CAPS: 8.0000 mg | ORAL_CAPSULE | Freq: Every day | ORAL | 6 refills | Status: AC

## 2024-05-01 MED ORDER — LUTETIUM LU 177 VIPIVOTIDE TET 1000 MBQ/ML IV SOLN
196.9500 | Freq: Once | INTRAVENOUS | Status: AC
  Administered 2024-05-01: 196.95 via INTRAVENOUS

## 2024-05-01 MED ORDER — SODIUM CHLORIDE 0.9 % IV BOLUS
1000.0000 mL | Freq: Once | INTRAVENOUS | Status: AC
  Administered 2024-05-01: 1000 mL via INTRAVENOUS

## 2024-05-01 NOTE — Written Directive (Addendum)
  PLUVICTO  THERAPY   RADIOPHARMACEUTICAL: Lutetium 177 vipivotide tetraxetan (Pluvicto)     PRESCRIBED DOSE FOR ADMINISTRATION:  200 mCi   ROUTE OFADMINISTRATION:  IV   DIAGNOSIS:  prostate cancer mets to bone, Prostate cancer metastatic to bone [facility])    REFERRING PHYSICIAN: Tina Pauletta BROCKS, MD   TREATMENT #: 1   ADDITIONAL PHYSICIAN COMMENTS/NOTES:  No LABS today AUTHORIZED USER SIGNATURE & TIME STAMP: Norleen GORMAN Boxer, MD   05/01/24    11:08 AM

## 2024-05-01 NOTE — Patient Instructions (Addendum)
 Consider [facility] for cost on medications

## 2024-05-01 NOTE — Progress Notes (Signed)
   Study?*  Template?* Transcript     First treatment    Comparison exam psma PET scan 03/14/2004    Patient presents for initial treatment. Cbc and cmp reviewed. Psa elevated to 12.    Patient reports some bone pain particularly in the left hip area.    Patient advised of radiation safety related to foley catheter.  Final Report      EXAM: NUCLEAR MEDICINE PLUVICTO INJECTION 05/01/2024 03:47:50 PM   TECHNIQUE: Patient presented to nuclear medicine for treatment of metastatic prostate cancer. PSMA PET scan was reviewed. The patient's most recent labs (CBC and CMP) were reviewed and it was determined to proceed with Lu-177 Pluvicto. Written informed consent was obtained from the patient. Written instructions for radiation safety were provided. The radiopharmaceutical was injected intravenously according to local protocol. Patient presents for initial treatment. PSA elevated to 12. Patient advised of radiation safety related to Foley catheter.   RADIOPHARMACEUTICAL: 196.95 mCi Lu-177 PLUVICTO   COMPARISON: PSMA PET scan 03/14/2004.   CLINICAL HISTORY: Malignant neoplasm of prostate (HCC); Secondary malignant neoplasm of bone and bone marrow; Secondary malignant neoplasm of bone and bone marrow.     FINDINGS:   Current Infusion: First treatment.   Planned Infusions: 6   Patient reports some bone pain particularly in the left hip area.   IMPRESSION: First of six planned Lu-177 Pluvicto treatments for metastatic prostate cancer to bone completed. Infusion tolerated without complication. Next infusion planned in 6 weeks.    RADPAIR

## 2024-05-01 NOTE — Progress Notes (Signed)
 Careteam: Patient Care Team: Gil Greig BRAVO, NP as PCP - General (Adult Health Nurse Practitioner) Charmayne Molly, MD as Consulting Physician (Ophthalmology) Gil Greig BRAVO, NP as Nurse Practitioner (Adult Health Nurse Practitioner) Vertell Pont, RN as Oncology Nurse Navigator Pickenpack-Cousar, Fannie SAILOR, NP as Nurse Practitioner (Hospice and Palliative Medicine)  Seen by: Greig Gil, AGNP-C  PLACE OF SERVICE:  [facility]  Advanced Directive information    Allergies  Allergen Reactions   Celebrex [Celecoxib] Hives and Shortness Of Breath   Erythromycin Hives and Shortness Of Breath   Oxycontin  [Oxycodone  Hcl] Hives and Shortness Of Breath   Penicillins Hives and Shortness Of Breath   Tetracyclines & Related Hives and Shortness Of Breath   Tape Rash    Chief Complaint  Patient presents with   Follow-up    3 month follow up     HPI: Patient is a 78 y.o. male seen today for medical management of chronic conditions.   Discussed the use of AI scribe software for clinical note transcription with the patient, who gave verbal consent to proceed.  History of Present Illness    He is followed by oncology and urology due to prostate cancer with metastasis to the bone. He experiences sharp pain in his left side when standing up straight. Scheduled to start Pluvicto infusions later today.   He is currently wearing a catheter and has difficulty managing it, particularly when standing and drinking, as he has to bend over to prevent spillage. He experiences frequent infections before catheter changes, which are managed by [facility]. He had to cancel a recent appointment for catheter change due to scheduling conflicts and now has to wait another ten days for the procedure.  He underwent an abdominal aortic stent graft placement on September 11th and is currently on aspirin  and Plavix  for at least six months post-procedure. He experiences easy bruising, which he attributes to the blood  thinners.  He is on several medications including silodosin  and finasteride  for urinary retention.   He has a history of diabetes, with his last A1c checked in May at 6.0. He is concerned about frequent blood draws and prefers to minimize them.   Review of Systems:  Review of Systems  Constitutional: Negative.   HENT: Negative.    Respiratory: Negative.    Cardiovascular: Negative.   Gastrointestinal:  Negative for abdominal pain and blood in stool.  Genitourinary:  Negative for flank pain and hematuria.  Musculoskeletal:  Positive for back pain and joint pain. Negative for falls.  Skin: Negative.   Neurological:  Negative for dizziness, weakness and headaches.  Psychiatric/Behavioral:  Positive for depression. Negative for memory loss. The patient has insomnia. The patient is not nervous/anxious.     Past Medical History:  Diagnosis Date   AAA (abdominal aortic aneurysm)    Allergic rhinitis due to pollen    Anxiety state, unspecified    Asymptomatic varicose veins    Atherosclerosis of native arteries of the extremities, unspecified    Cataract 10/17/2016   L Eye   Contact dermatitis and other eczema due to other chemical products    Diaphragmatic hernia without mention of obstruction or gangrene    Elevated PSA    Flatulence, eructation, and gas pain    Headache(784.0)    Hemorrhoids, external    Herpes zoster with unspecified complication    Hyperpotassemia    Insomnia, unspecified    Lumbago    Lumbago    Myalgia and myositis, unspecified  Nocturia    Obesity, unspecified    Osteoarthrosis, unspecified whether generalized or localized, unspecified site    Other abnormal blood chemistry    Other headache syndromes(339.89)    Pre-diabetes    Pure hyperglyceridemia    Rash and other nonspecific skin eruption    Rotator cuff (capsule) sprain    Routine general medical examination at a health care facility    Tobacco use disorder    Unspecified essential  hypertension    Unspecified hypertrophic and atrophic condition of skin    Unspecified sleep apnea    Past Surgical History:  Procedure Laterality Date   ABDOMINAL AORTIC ENDOVASCULAR FENESTRATED STENT GRAFT Bilateral 03/20/2024   Procedure: ABDOMINAL AORTIC ENDOVASCULAR FENESTRATED STENT GRAFT, Stenting of Right Internal Iliac Artery,;  Surgeon: Pearline Norman RAMAN, MD;  Location: [facility] OR;  Service: Vascular;  Laterality: Bilateral;   CYSTOSCOPY N/A 08/22/2022   Procedure: PHYLLIS;  Surgeon: Rosalind Zachary NOVAK, MD;  Location: [facility];  Service: Urology;  Laterality: N/A;   DENTAL SURGERY  05/10/2021   Per patient   HEMORRHOID SURGERY     INSERTION OF ILIAC STENT Bilateral 03/20/2024   Procedure: INSERTION, STENT, ARTERY, ILIAC;  Surgeon: Pearline Norman RAMAN, MD;  Location: [facility] OR;  Service: Vascular;  Laterality: Bilateral;   PROSTATE BIOPSY     TRANSURETHRAL RESECTION OF PROSTATE N/A 08/22/2022   Procedure: TRANSURETHRAL RESECTION OF THE PROSTATE (TURP);  Surgeon: Rosalind Zachary NOVAK, MD;  Location: [facility];  Service: Urology;  Laterality: N/A;   tumorectomy Left    Removed tumor from his chest.   VARICOSE VEIN SURGERY     Social History:   reports that he quit smoking about 8 years ago. His smoking use included cigarettes. He started smoking about 63 years ago. He has a 82.5 pack-year smoking history. He has never used smokeless tobacco. He reports that he does not drink alcohol and does not use drugs.  Family History  Problem Relation Age of Onset   Diabetes Mother    Heart disease Mother    Stroke Mother    Stroke Father    Leukemia Maternal Aunt        dx >50   Breast cancer Paternal Aunt        dx >50   Stomach cancer Cousin        d. 26; pat male cousin    Medications: Patient's Medications  New Prescriptions   No medications on file  Previous Medications   ACETAMINOPHEN  (TYLENOL ) 325 MG TABLET    Take 650 mg by mouth every 6 (six) hours  as needed (pain and sleep aid).   ASPIRIN  EC 81 MG TABLET    Take 1 tablet (81 mg total) by mouth daily at 6 (six) AM. Swallow whole.   CALCIUM -VITAMIN D (OSCAL WITH D) 500-5 MG-MCG TABLET    Take 1 tablet by mouth daily at 6 (six) AM.   CETIRIZINE  (ZYRTEC ) 10 MG TABLET    Take 1 tablet (10 mg total) by mouth daily.   CLOPIDOGREL  (PLAVIX ) 75 MG TABLET    Take 1 tablet (75 mg total) by mouth daily.   DAROLUTAMIDE  (NUBEQA ) 300 MG TABLET    Take 2 tablets (600 mg total) by mouth 2 (two) times daily with a meal.   FINASTERIDE  (PROSCAR ) 5 MG TABLET    TAKE 1 TABLET(5 MG) BY MOUTH DAILY   HYDROCODONE -ACETAMINOPHEN  (NORCO/VICODIN) 5-325 MG TABLET    Take 1 tablet by mouth every 6 (six)  hours as needed for moderate pain (pain score 4-6).   LOSARTAN  (COZAAR ) 100 MG TABLET    TAKE 1 TABLET BY MOUTH DAILY   SILODOSIN  (RAPAFLO ) 8 MG CAPS CAPSULE    TAKE 1 CAPSULE(8 MG) BY MOUTH DAILY WITH BREAKFAST   SIMVASTATIN  (ZOCOR ) 20 MG TABLET    TAKE 1 TABLET(20 MG) BY MOUTH DAILY AT 6 PM   TRAZODONE  (DESYREL ) 150 MG TABLET    Take 1 tablet (150 mg total) by mouth at bedtime.  Modified Medications   No medications on file  Discontinued Medications   No medications on file    Physical Exam:  Vitals:   05/01/24 0826  BP: 130/72  Pulse: 62  Temp: (!) 97.4 F (36.3 C)  SpO2: 96%  Weight: 210 lb 12.8 oz (95.6 kg)  Height: 5' 9 (1.753 m)   Body mass index is 31.13 kg/m. Wt Readings from Last 3 Encounters:  05/01/24 210 lb 12.8 oz (95.6 kg)  04/18/24 208 lb (94.3 kg)  04/14/24 206 lb 12.8 oz (93.8 kg)    Physical Exam Vitals reviewed.  Constitutional:      General: He is not in acute distress. HENT:     Head: Normocephalic.  Eyes:     General:        Right eye: No discharge.        Left eye: No discharge.  Cardiovascular:     Rate and Rhythm: Normal rate and regular rhythm.     Pulses: Normal pulses.     Heart sounds: Normal heart sounds.  Pulmonary:     Effort: Pulmonary effort is normal.      Breath sounds: Normal breath sounds.  Abdominal:     General: Bowel sounds are normal. There is no distension.     Palpations: Abdomen is soft.     Tenderness: There is no abdominal tenderness.  Musculoskeletal:     Cervical back: Neck supple.     Right lower leg: No edema.     Left lower leg: No edema.  Skin:    General: Skin is warm.     Capillary Refill: Capillary refill takes less than 2 seconds.  Neurological:     General: No focal deficit present.     Mental Status: He is alert and oriented to person, place, and time.     Gait: Gait normal.  Psychiatric:        Mood and Affect: Mood normal.     Labs reviewed: Basic Metabolic Panel: Recent Labs    03/20/24 1510 03/21/24 0510 03/24/24 0957 04/30/24 1341  NA 135 137 135 139  K 4.3 3.6 3.8 5.0  CL 104 104 102 104  CO2 24 23 26 29   GLUCOSE 146* 112* 184* 120*  BUN 9 9 12 18   CREATININE 0.57* 0.64 0.65 0.74  CALCIUM  8.0* 7.8* 8.2* 9.8  MG 1.6*  --   --   --    Liver Function Tests: Recent Labs    03/03/24 0804 03/24/24 0957 04/30/24 1341  AST 16 13* 17  ALT 13 11 11   ALKPHOS 60 56 74  BILITOT 0.4 0.7 0.5  PROT 6.7 6.8 7.7  ALBUMIN  4.2 3.7 4.2   Recent Labs    01/04/24 1152  LIPASE 27   No results for input(s): AMMONIA in the last 8760 hours. CBC: Recent Labs    03/03/24 0804 03/20/24 1510 03/21/24 0510 03/24/24 0957 04/30/24 1341  WBC 4.1   < > 6.3 7.0 4.6  NEUTROABS 2.2  --   --  5.7 3.1  HGB 12.9*   < > 10.4* 11.2* 12.2*  HCT 38.5*   < > 30.7* 33.0* 36.2*  MCV 97.7   < > 98.4 97.1 96.3  PLT 179   < > 132* 164 219   < > = values in this interval not displayed.   Lipid Panel: Recent Labs    11/08/23 0912  CHOL 139  HDL 50  LDLCALC 65  TRIG 153*  CHOLHDL 2.8   TSH: No results for input(s): TSH in the last 8760 hours. A1C: Lab Results  Component Value Date   HGBA1C 6.0 (H) 11/08/2023     Assessment/Plan 1. Prostate cancer metastatic to bone [facility]) (Primary) -  followed by oncology and urology - 10/23 starting Pluvicto infusions  2. Infrarenal abdominal aortic aneurysm (AAA) without rupture - followed by Dr. Pearline - s/p AAA stent placement 03/20/2024 - cont asa and plavix   3. Urinary retention - ongoing - associated with BPH/ Prostate cancer - cont finasteride  and silodosin  - silodosin  (RAPAFLO ) 8 MG CAPS capsule; Take 1 capsule (8 mg total) by mouth daily with breakfast.  Dispense: 30 capsule; Refill: 6 - finasteride  (PROSCAR ) 5 MG tablet; Take 1 tablet (5 mg total) by mouth daily.  Dispense: 90 tablet; Refill: 2  4. Essential hypertension - controlled with losartan  - BUN/creat 18/0.74 04/30/2024 - losartan  (COZAAR ) 100 MG tablet; TAKE 1 TABLET BY MOUTH DAILY  Dispense: 90 tablet; Refill: 3  5. Mixed hyperlipidemia - not fasting  - cont simvastatin   - Lipid Panel; Future  6. Prediabetes - Last A1c 6.0 - diet controlled - Hemoglobin A1c  7. Situational depression - associated to cancer and caregiver for wife with dementia - no changes in moood - traZODone  (DESYREL ) 150 MG tablet; Take 1 tablet (150 mg total) by mouth at bedtime.  Dispense: 90 tablet; Refill: 2  9. Weight gain - TSH  Total time: 34 minutes. Greater than 50% of total time spent doing patient education regarding health maintenance, prostate cancer, urinary retention, HTN, HLD, prediabetes and depression including symptom/medication management.    Next appt: Visit date not found  Eschol Auxier Gil BODILY  [facility] [phone]

## 2024-05-02 ENCOUNTER — Ambulatory Visit: Payer: Self-pay | Admitting: Orthopedic Surgery

## 2024-05-07 DIAGNOSIS — Z08 Encounter for follow-up examination after completed treatment for malignant neoplasm: Secondary | ICD-10-CM | POA: Diagnosis not present

## 2024-05-07 DIAGNOSIS — Z85820 Personal history of malignant melanoma of skin: Secondary | ICD-10-CM | POA: Diagnosis not present

## 2024-05-07 DIAGNOSIS — L2989 Other pruritus: Secondary | ICD-10-CM | POA: Diagnosis not present

## 2024-05-07 DIAGNOSIS — L814 Other melanin hyperpigmentation: Secondary | ICD-10-CM | POA: Diagnosis not present

## 2024-05-07 DIAGNOSIS — L821 Other seborrheic keratosis: Secondary | ICD-10-CM | POA: Diagnosis not present

## 2024-05-07 DIAGNOSIS — D225 Melanocytic nevi of trunk: Secondary | ICD-10-CM | POA: Diagnosis not present

## 2024-05-07 DIAGNOSIS — Z85828 Personal history of other malignant neoplasm of skin: Secondary | ICD-10-CM | POA: Diagnosis not present

## 2024-05-12 DIAGNOSIS — R338 Other retention of urine: Secondary | ICD-10-CM | POA: Diagnosis not present

## 2024-05-14 DIAGNOSIS — H1045 Other chronic allergic conjunctivitis: Secondary | ICD-10-CM | POA: Diagnosis not present

## 2024-05-14 DIAGNOSIS — J309 Allergic rhinitis, unspecified: Secondary | ICD-10-CM | POA: Diagnosis not present

## 2024-05-14 DIAGNOSIS — R21 Rash and other nonspecific skin eruption: Secondary | ICD-10-CM | POA: Diagnosis not present

## 2024-05-14 DIAGNOSIS — R634 Abnormal weight loss: Secondary | ICD-10-CM | POA: Diagnosis not present

## 2024-05-15 ENCOUNTER — Other Ambulatory Visit: Payer: Self-pay | Admitting: *Deleted

## 2024-05-15 DIAGNOSIS — C61 Malignant neoplasm of prostate: Secondary | ICD-10-CM

## 2024-05-15 NOTE — Progress Notes (Unsigned)
 [facility] OFFICE PROGRESS NOTE  Patient Care Team: Gil Greig BRAVO, NP as PCP - General (Adult Health Nurse Practitioner) Charmayne Molly, MD as Consulting Physician (Ophthalmology) Gil Greig BRAVO, NP as Nurse Practitioner (Adult Health Nurse Practitioner) Vertell Pont, RN as Oncology Nurse Navigator Pickenpack-Cousar, Fannie SAILOR, NP as Nurse Practitioner [facility] Palliative [facility])  David [name] is a 78 y.o. man with history of hypertension, hyperlipidemia here for follow-up prostate cancer.    Current Diagnosis: mCRPC Initial diagnosis: 08/2022. mHSPC with lymph node, bone, lung metastases. From TURP Gleason score 4+5=9 (grade group5)  Previous treatment: Had one dose of docetaxel . Given the AAA, discuss potential rupture which we cannot predict. After discussion will use lower dose also with his age.  Current Treatment: ADT, darolutamide  (started 06/29/2023). Genetic testing for metastatic prostate cancer: neg. 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   Somatic testing, no actionable mutation.  Variant of SF3B1, TP53, CTNNB1. pMMR. TMB 3.7. No HRR mutation.   Getting ADT through [facility] and also monthly Xgeva  and catheter exchange there.   10/23 started Pluvicto  Clinically stable. Grade 1 fatigue after first treatment.  Assessment & Plan Prostate cancer metastatic to bone [facility]) Continue ADT (last in June 2025, 6 mo. Dose at [facility]) Continue darolutamide  (06/29/23). Supply via mail He is on low-dose simvastatin , no added toxicity. Continue Pluvicto. Next on 12/11 and 1/22 Return to see me with PSA, testosterone  on 1/16 at 8:30 with lab at 8 History of radiation completed on L hip/pelvis 30 Gy/10 fx Essential hypertension On losartan  100 mg daily  Mixed hyperlipidemia On simvastatin . No toxicities with daro. At risk for side effect of medication baseline bone mineral density: 04/06/2023 Right Femur Neck  T= -1.8 osteopenia   calcium  and vitamin D3. Refilled today. prevent diabetes He has no teeth. He get Xgeva  at [facility] monthly Recommend exercises and activities as able Limit alcohol consumption and avoid smoking Monitoring control blood pressure Normocytic anemia Stable. Will not need transfusion Osteopenia of right hip Refilled calcium  / vit D today. Left hip pain Pain before the fall and able to ambulate. Acetaminophen  1000 mg up to 3 times a day as needed. If no improved will consider palliative radiation If signs of bruising or bleeding report to ED as he is on ASA/plavix .  Orders Placed This Encounter  Procedures   CBC with Differential (Cancer Center Only)    Standing Status:   Future    Expiration Date:   05/16/2025   CMP (Cancer Center only)    Standing Status:   Future    Expiration Date:   05/16/2025   Testosterone     Standing Status:   Future    Expiration Date:   05/16/2025   PSA    Standing Status:   Future    Expiration Date:   05/16/2025   Sample to Blood Bank    Standing Status:   Standing    Number of Occurrences:   33    Expiration Date:   05/16/2025   ABO/Rh    Standing Status:   Future    Expiration Date:   05/16/2025     [name], MD  INTERVAL HISTORY: Patient returns for follow-up. Some fatigue after first treatment. No other changes. Able to tolerate darolutamide . Taking vit D/Calcium . Fall this morning due to back pain. Able to get up and drive here. He did not know if he can take Tylenol  and I told him yes and daily  limit of 3000 mg maximum. He reports otherwise well and can drive home.  Oncology History  Prostate cancer metastatic to bone [facility])  08/2022 Surgery   TURP for BPH: Prostatic adenocarcinoma, Gleason score 4+5=9 (grade group5) involves 80% of the resected tissue.  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     08/2022 Initial Diagnosis   Prostate cancer metastatic to bone (HCC)   09/28/2022 Tumor Marker   PSA 33.5   10/30/2022 Imaging   PSMA PET IMPRESSION: 1. There is diffuse radiotracer uptake throughout the prostate gland compatible with residual/recurrent prostate cancer. 2. Bilateral tracer avid pelvic lymph nodes compatible with nodal metastasis. 3. Multifocal tracer avid bone metastases. 4. Solitary tracer avid pulmonary nodule within the right upper lobe is identified. 5. 5.2 cm infrarenal abdominal aortic aneurysm. Recommend follow-up every 6 months and vascular consultation. This recommendation follows ACR consensus guidelines: White Paper of the ACR Incidental Findings Committee II on Vascular Findings. J Am Coll Radiol 2013; 10:789-794. Aortic aneurysm NOS (ICD10-I71.9). 6.  Aortic Atherosclerosis (ICD10-I70.0).   11/2022 -  Chemotherapy   started Orgovynx and converted to Eligard .   03/02/2023 Tumor Marker   PSA 14.8 testosterone  19.7. alk phos 77   04/09/2023 Genetic Testing   Negative CustomNext-Cancer +RNAinsight Panel.  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  Report date is 04/09/2023.   The CustomNext-Cancer+RNAinsight panel offered by [facility] includes sequencing, rearrangement, and RNA analysis for the following 47 genes:  APC, ATM, AXIN2, BAP1, BARD1, BMPR1A, BRCA1, BRCA2, BRIP1, CDH1, CDK4, CDKN2A, CHEK2, CTNNA1, DICER1, EPCAM, FH, GREM1, HOXB13, KIT, MEN1, MLH1, MSH2, MSH3, MSH6, MUTYH, NF1, NTHL1, PALB2, PDGFRA, PMS2, POLD1, POLE, PTEN, RAD51C, RAD51D, SDHA, SDHB, SDHC, SDHD, SMAD4, SMARCA4, STK11, TP53, TSC1, TSC2, VHL.     04/2023 -  Chemotherapy   Started Xtandi 160 mg.   06/29/2023 - 06/29/2023 Chemotherapy   Patient is on Treatment Plan : PROSTATE Docetaxel  (50) q14d        PHYSICAL EXAMINATION: ECOG PERFORMANCE STATUS: 3 - Symptomatic, >50% confined to bed  Vitals:   05/16/24 0927  BP: 129/73  Resp: 17  Temp: 97.6 F (36.4 C)   Filed Weights    05/16/24 0927  Weight: 215 lb (97.5 kg)    GENERAL: alert, no distress and comfortable SKIN: skin color normal and no jaundice  No bruising or petechiae around the lower back and hip LUNGS: normal breathing effort   Relevant data reviewed during this visit included labs.  New labs ordered.

## 2024-05-16 ENCOUNTER — Ambulatory Visit: Payer: Self-pay

## 2024-05-16 ENCOUNTER — Inpatient Hospital Stay

## 2024-05-16 VITALS — BP 129/73 | Temp 97.6°F | Resp 17 | Ht 69.0 in | Wt 215.0 lb

## 2024-05-16 DIAGNOSIS — E782 Mixed hyperlipidemia: Secondary | ICD-10-CM

## 2024-05-16 DIAGNOSIS — I10 Essential (primary) hypertension: Secondary | ICD-10-CM | POA: Diagnosis not present

## 2024-05-16 DIAGNOSIS — D649 Anemia, unspecified: Secondary | ICD-10-CM

## 2024-05-16 DIAGNOSIS — C7951 Secondary malignant neoplasm of bone: Secondary | ICD-10-CM

## 2024-05-16 DIAGNOSIS — M549 Dorsalgia, unspecified: Secondary | ICD-10-CM | POA: Insufficient documentation

## 2024-05-16 DIAGNOSIS — R5383 Other fatigue: Secondary | ICD-10-CM | POA: Insufficient documentation

## 2024-05-16 DIAGNOSIS — Z87891 Personal history of nicotine dependence: Secondary | ICD-10-CM | POA: Diagnosis not present

## 2024-05-16 DIAGNOSIS — M85851 Other specified disorders of bone density and structure, right thigh: Secondary | ICD-10-CM | POA: Insufficient documentation

## 2024-05-16 DIAGNOSIS — M25552 Pain in left hip: Secondary | ICD-10-CM

## 2024-05-16 DIAGNOSIS — Z79891 Long term (current) use of opiate analgesic: Secondary | ICD-10-CM | POA: Insufficient documentation

## 2024-05-16 DIAGNOSIS — W19XXXA Unspecified fall, initial encounter: Secondary | ICD-10-CM | POA: Insufficient documentation

## 2024-05-16 DIAGNOSIS — C61 Malignant neoplasm of prostate: Secondary | ICD-10-CM | POA: Insufficient documentation

## 2024-05-16 DIAGNOSIS — Z9189 Other specified personal risk factors, not elsewhere classified: Secondary | ICD-10-CM | POA: Diagnosis not present

## 2024-05-16 LAB — CMP (CANCER CENTER ONLY)
ALT: 8 U/L (ref 0–44)
AST: 14 U/L — ABNORMAL LOW (ref 15–41)
Albumin: 4.1 g/dL (ref 3.5–5.0)
Alkaline Phosphatase: 57 U/L (ref 38–126)
Anion gap: 6 (ref 5–15)
BUN: 17 mg/dL (ref 8–23)
CO2: 26 mmol/L (ref 22–32)
Calcium: 9.4 mg/dL (ref 8.9–10.3)
Chloride: 106 mmol/L (ref 98–111)
Creatinine: 0.68 mg/dL (ref 0.61–1.24)
GFR, Estimated: >60 mL/min (ref >60)
Glucose, Bld: 132 mg/dL — ABNORMAL HIGH (ref 70–99)
Potassium: 4.8 mmol/L (ref 3.5–5.1)
Sodium: 138 mmol/L (ref 135–145)
Total Bilirubin: 0.5 mg/dL (ref 0.0–1.2)
Total Protein: 7.5 g/dL (ref 6.5–8.1)

## 2024-05-16 LAB — CBC WITH DIFFERENTIAL (CANCER CENTER ONLY)
Abs Immature Granulocytes: 0.04 K/uL (ref 0.00–0.07)
Basophils Absolute: 0 K/uL (ref 0.0–0.1)
Basophils Relative: 1 %
Eosinophils Absolute: 0.1 K/uL (ref 0.0–0.5)
Eosinophils Relative: 3 %
HCT: 36.8 % — ABNORMAL LOW (ref 39.0–52.0)
Hemoglobin: 12.1 g/dL — ABNORMAL LOW (ref 13.0–17.0)
Immature Granulocytes: 1 %
Lymphocytes Relative: 15 %
Lymphs Abs: 0.7 K/uL (ref 0.7–4.0)
MCH: 31.6 pg (ref 26.0–34.0)
MCHC: 32.9 g/dL (ref 30.0–36.0)
MCV: 96.1 fL (ref 80.0–100.0)
Monocytes Absolute: 0.5 K/uL (ref 0.1–1.0)
Monocytes Relative: 11 %
Neutro Abs: 3.1 K/uL (ref 1.7–7.7)
Neutrophils Relative %: 69 %
Platelet Count: 208 K/uL (ref 150–400)
RBC: 3.83 MIL/uL — ABNORMAL LOW (ref 4.22–5.81)
RDW: 13.9 % (ref 11.5–15.5)
WBC Count: 4.4 K/uL (ref 4.0–10.5)
nRBC: 0 % (ref 0.0–0.2)

## 2024-05-16 LAB — PSA: Prostatic Specific Antigen: 9.19 ng/mL — ABNORMAL HIGH (ref 0.00–4.00)

## 2024-05-16 MED ORDER — OYSTER SHELL CALCIUM/D3 500-5 MG-MCG PO TABS: 1.0000 | ORAL_TABLET | Freq: Two times a day (BID) | ORAL | 1 refills | Status: AC

## 2024-05-16 NOTE — Assessment & Plan Note (Addendum)
 Pain before the fall and able to ambulate. Acetaminophen  1000 mg up to 3 times a day as needed. If no improved will consider palliative radiation If signs of bruising or bleeding report to ED as he is on ASA/plavix .

## 2024-05-16 NOTE — Assessment & Plan Note (Addendum)
 On losartan 100 mg daily

## 2024-05-16 NOTE — Assessment & Plan Note (Addendum)
 baseline bone mineral density: 04/06/2023 Right Femur Neck  T= -1.8 osteopenia  calcium  and vitamin D3. Refilled today. prevent diabetes He has no teeth. He get Xgeva  at [facility] monthly Recommend exercises and activities as able Limit alcohol consumption and avoid smoking Monitoring control blood pressure

## 2024-05-16 NOTE — Telephone Encounter (Signed)
-----   Message from [name] sent at 05/16/2024  3:25 PM EST ----- David [name] please let him know good news. PSA is coming down. Thanks.  ----- Message ----- From: Rebecka, Lab In [place] Sent: 05/16/2024   9:20 AM EST To: [name], MD

## 2024-05-16 NOTE — Assessment & Plan Note (Addendum)
 On simvastatin. No toxicities with daro.

## 2024-05-16 NOTE — Telephone Encounter (Signed)
 Notified of message below

## 2024-05-16 NOTE — Assessment & Plan Note (Addendum)
 Continue ADT (last in June 2025, 6 mo. Dose at [facility]) Continue darolutamide  (06/29/23). Supply via mail He is on low-dose simvastatin , no added toxicity. Continue Pluvicto. Next on 12/11 and 1/22 Return to see me with PSA, testosterone  on 1/16 at 8:30 with lab at 8 History of radiation completed on L hip/pelvis 30 Gy/10 fx

## 2024-05-16 NOTE — Assessment & Plan Note (Signed)
 Stable. Will not need transfusion

## 2024-05-19 ENCOUNTER — Encounter: Admitting: Nurse Practitioner

## 2024-05-22 ENCOUNTER — Other Ambulatory Visit: Payer: Self-pay | Admitting: *Deleted

## 2024-05-22 DIAGNOSIS — C61 Malignant neoplasm of prostate: Secondary | ICD-10-CM

## 2024-05-22 MED ORDER — DAROLUTAMIDE 300 MG PO TABS: 600.0000 mg | ORAL_TABLET | Freq: Two times a day (BID) | ORAL | 11 refills | Status: AC

## 2024-05-30 ENCOUNTER — Ambulatory Visit: Admitting: Vascular Surgery

## 2024-06-02 ENCOUNTER — Other Ambulatory Visit (HOSPITAL_COMMUNITY): Payer: Self-pay

## 2024-06-09 DIAGNOSIS — Z7902 Long term (current) use of antithrombotics/antiplatelets: Secondary | ICD-10-CM | POA: Diagnosis not present

## 2024-06-09 DIAGNOSIS — Z6830 Body mass index (BMI) 30.0-30.9, adult: Secondary | ICD-10-CM | POA: Diagnosis not present

## 2024-06-09 DIAGNOSIS — R32 Unspecified urinary incontinence: Secondary | ICD-10-CM | POA: Diagnosis not present

## 2024-06-09 DIAGNOSIS — M1990 Unspecified osteoarthritis, unspecified site: Secondary | ICD-10-CM | POA: Diagnosis not present

## 2024-06-09 DIAGNOSIS — F4320 Adjustment disorder, unspecified: Secondary | ICD-10-CM | POA: Diagnosis not present

## 2024-06-09 DIAGNOSIS — F325 Major depressive disorder, single episode, in full remission: Secondary | ICD-10-CM | POA: Diagnosis not present

## 2024-06-09 DIAGNOSIS — Z7982 Long term (current) use of aspirin: Secondary | ICD-10-CM | POA: Diagnosis not present

## 2024-06-09 DIAGNOSIS — Z880 Allergy status to penicillin: Secondary | ICD-10-CM | POA: Diagnosis not present

## 2024-06-09 DIAGNOSIS — C61 Malignant neoplasm of prostate: Secondary | ICD-10-CM | POA: Diagnosis not present

## 2024-06-09 DIAGNOSIS — Z8589 Personal history of malignant neoplasm of other organs and systems: Secondary | ICD-10-CM | POA: Diagnosis not present

## 2024-06-09 DIAGNOSIS — I2510 Atherosclerotic heart disease of native coronary artery without angina pectoris: Secondary | ICD-10-CM | POA: Diagnosis not present

## 2024-06-09 DIAGNOSIS — N181 Chronic kidney disease, stage 1: Secondary | ICD-10-CM | POA: Diagnosis not present

## 2024-06-09 DIAGNOSIS — I129 Hypertensive chronic kidney disease with stage 1 through stage 4 chronic kidney disease, or unspecified chronic kidney disease: Secondary | ICD-10-CM | POA: Diagnosis not present

## 2024-06-09 DIAGNOSIS — Z85828 Personal history of other malignant neoplasm of skin: Secondary | ICD-10-CM | POA: Diagnosis not present

## 2024-06-09 DIAGNOSIS — E669 Obesity, unspecified: Secondary | ICD-10-CM | POA: Diagnosis not present

## 2024-06-09 DIAGNOSIS — Z881 Allergy status to other antibiotic agents status: Secondary | ICD-10-CM | POA: Diagnosis not present

## 2024-06-09 DIAGNOSIS — E785 Hyperlipidemia, unspecified: Secondary | ICD-10-CM | POA: Diagnosis not present

## 2024-06-09 DIAGNOSIS — N400 Enlarged prostate without lower urinary tract symptoms: Secondary | ICD-10-CM | POA: Diagnosis not present

## 2024-06-12 ENCOUNTER — Telehealth: Payer: Self-pay

## 2024-06-12 DIAGNOSIS — R338 Other retention of urine: Secondary | ICD-10-CM | POA: Diagnosis not present

## 2024-06-12 NOTE — Telephone Encounter (Signed)
 Oral Oncology Patient Advocate Encounter   Received notification that patient is due for re-enrollment for assistance for Nubeqa  through [employer].   Re-enrollment process has been initiated and will be submitted upon completion of necessary documents. Re-enrollment submitted via online portal.  [employer] phone number [phone].   I will continue to follow until final determination.  Lucie Lamer, CPhT [place]  [facility] Oncology Pharmacy Patient Advocate Specialist [address] [place] Phone: [phone]  Fax: [phone] [email or web address]

## 2024-06-16 NOTE — Telephone Encounter (Signed)
 Oral Oncology Patient Advocate Encounter   Received notification re-enrollment for assistance for Nubeqa  through [employer]  has been approved. Patient may continue to receive their medication at $0 from this program.    [employer] phone number [phone].    Effective dates: 07/10/24 through 07/09/25   Lucie Lamer, CPhT [place]  [facility] Oncology Pharmacy Patient Advocate Specialist [facility] [place] Phone: [phone]  Fax: [phone] [email or web address]

## 2024-06-18 NOTE — Written Directive (Addendum)
°  PLUVICTO   THERAPY   RADIOPHARMACEUTICAL: Lutetium 177 vipivotide tetraxetan (Pluvicto )     PRESCRIBED DOSE FOR ADMINISTRATION:  200 mCi   ROUTE OFADMINISTRATION:  IV   DIAGNOSIS:  prostate cancer mets to bone, Prostate cancer metastatic to bone [facility])    REFERRING PHYSICIAN: Tina Pauletta BROCKS, MD   TREATMENT #: 2   ADDITIONAL PHYSICIAN COMMENTS/NOTES: LABS  ordered   AUTHORIZED USER SIGNATURE & TIME STAMP: Norleen GORMAN Boxer, MD   06/19/2024    8:25 AM

## 2024-06-19 ENCOUNTER — Ambulatory Visit (HOSPITAL_COMMUNITY): Admission: RE | Admit: 2024-06-19 | Discharge: 2024-06-19 | Disposition: A | Source: Ambulatory Visit

## 2024-06-19 VITALS — BP 154/76 | HR 64

## 2024-06-19 DIAGNOSIS — C61 Malignant neoplasm of prostate: Secondary | ICD-10-CM

## 2024-06-19 LAB — CBC WITH DIFFERENTIAL/PLATELET
Abs Immature Granulocytes: 0.03 K/uL (ref 0.00–0.07)
Basophils Absolute: 0.1 K/uL (ref 0.0–0.1)
Basophils Relative: 1 %
Eosinophils Absolute: 0.1 K/uL (ref 0.0–0.5)
Eosinophils Relative: 3 %
HCT: 38.5 % — ABNORMAL LOW (ref 39.0–52.0)
Hemoglobin: 12.4 g/dL — ABNORMAL LOW (ref 13.0–17.0)
Immature Granulocytes: 1 %
Lymphocytes Relative: 20 %
Lymphs Abs: 0.8 K/uL (ref 0.7–4.0)
MCH: 32.3 pg (ref 26.0–34.0)
MCHC: 32.2 g/dL (ref 30.0–36.0)
MCV: 100.3 fL — ABNORMAL HIGH (ref 80.0–100.0)
Monocytes Absolute: 0.5 K/uL (ref 0.1–1.0)
Monocytes Relative: 11 %
Neutro Abs: 2.8 K/uL (ref 1.7–7.7)
Neutrophils Relative %: 64 %
Platelets: 206 K/uL (ref 150–400)
RBC: 3.84 MIL/uL — ABNORMAL LOW (ref 4.22–5.81)
RDW: 14.3 % (ref 11.5–15.5)
WBC: 4.3 K/uL (ref 4.0–10.5)
nRBC: 0 % (ref 0.0–0.2)

## 2024-06-19 LAB — PSA: Prostatic Specific Antigen: 14.49 ng/mL — ABNORMAL HIGH (ref 0.00–4.00)

## 2024-06-19 MED ORDER — SODIUM CHLORIDE 0.9 % IV SOLN: INTRAVENOUS | Status: AC

## 2024-06-19 MED ORDER — LUTETIUM LU 177 VIPIVOTIDE TET 1000 MBQ/ML IV SOLN
204.6200 | Freq: Once | INTRAVENOUS | Status: AC
  Administered 2024-06-19: 204.62 via INTRAVENOUS

## 2024-06-19 MED ORDER — SODIUM CHLORIDE 0.9 % IV BOLUS: 1000.0000 mL | Freq: Once | INTRAVENOUS | Status: DC

## 2024-06-19 NOTE — Progress Notes (Signed)
Tolerated Pluvicto treatment well

## 2024-07-09 ENCOUNTER — Other Ambulatory Visit: Payer: Self-pay

## 2024-07-09 ENCOUNTER — Other Ambulatory Visit (HOSPITAL_COMMUNITY): Payer: Self-pay | Admitting: Adult Health

## 2024-07-09 DIAGNOSIS — C61 Malignant neoplasm of prostate: Secondary | ICD-10-CM

## 2024-07-21 ENCOUNTER — Telehealth: Payer: Self-pay

## 2024-07-21 NOTE — Telephone Encounter (Signed)
 No answer. Arland Legions BSN RN

## 2024-07-27 NOTE — Assessment & Plan Note (Addendum)
 Continue ADT (supposed to get in Dec 2025, 6 mo. Dose at [facility]) Continue darolutamide  (06/29/23). Supply via mail He is on low-dose simvastatin , no added toxicity. Continue Pluvicto .  Post C2. Next on 1/22, 3/5 Return to see me with PSA, testosterone  on 3/2 at 8:30 with lab at 8 History of radiation completed on L hip/pelvis 30 Gy/10 fx

## 2024-07-27 NOTE — Progress Notes (Unsigned)
 [facility] OFFICE PROGRESS NOTE  Patient Care Team: Gil Greig BRAVO, NP as PCP - General (Adult Health Nurse Practitioner) Charmayne Molly, MD as Consulting Physician (Ophthalmology) Gil Greig BRAVO, NP as Nurse Practitioner (Adult Health Nurse Practitioner) Vertell Pont, RN as Oncology Nurse Navigator Pickenpack-Cousar, Fannie SAILOR, NP as Nurse Practitioner [facility] and Palliative Medicine)  Assessment & Plan Prostate cancer metastatic to bone [facility]) Continue ADT (last in June 2025, 6 mo. Dose at [facility]) Continue darolutamide  (06/29/23). Supply via mail He is on low-dose simvastatin , no added toxicity. Continue Pluvicto .  Post C2. Next on 1/22, 3/5 Return to see me with PSA, testosterone  on 3/2 at 8:30 with lab at 8 History of radiation completed on L hip/pelvis 30 Gy/10 fx At risk for side effect of medication baseline bone mineral density: 04/06/2023 Right Femur Neck  T= -1.8 osteopenia  calcium  and vitamin D3. Refilled today. prevent diabetes He has no teeth. He get Xgeva  at [facility] monthly Recommend exercises and activities as able Limit alcohol consumption and avoid smoking Monitoring control blood pressure  No orders of the defined types were placed in this encounter.    [name], MD  INTERVAL HISTORY: Patient returns for follow-up.  Oncology History  Prostate cancer metastatic to bone [facility])  08/2022 Surgery   TURP for BPH: Prostatic adenocarcinoma, Gleason score 4+5=9 (grade group5) involves 80% of the resected tissue.  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    08/2022 Initial Diagnosis   Prostate cancer metastatic to bone (HCC)   09/28/2022 Tumor Marker   PSA 33.5   10/30/2022 Imaging   PSMA PET IMPRESSION: 1. There is diffuse radiotracer uptake throughout the prostate gland compatible with  residual/recurrent prostate cancer. 2. Bilateral tracer avid pelvic lymph nodes compatible with nodal metastasis. 3. Multifocal tracer avid bone metastases. 4. Solitary tracer avid pulmonary nodule within the right upper lobe is identified. 5. 5.2 cm infrarenal abdominal aortic aneurysm. Recommend follow-up every 6 months and vascular consultation. This recommendation follows ACR consensus guidelines: White Paper of the ACR Incidental Findings Committee II on Vascular Findings. J Am Coll Radiol 2013; 10:789-794. Aortic aneurysm NOS (ICD10-I71.9). 6.  Aortic Atherosclerosis (ICD10-I70.0).   11/2022 -  Chemotherapy   started Orgovynx and converted to Eligard .   03/02/2023 Tumor Marker   PSA 14.8 testosterone  19.7. alk phos 77   04/09/2023 Genetic Testing   Negative CustomNext-Cancer +RNAinsight Panel.  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  Report date is 04/09/2023.   The CustomNext-Cancer+RNAinsight panel offered by [facility] includes sequencing, rearrangement, and RNA analysis for the following 47 genes:  APC, ATM, AXIN2, BAP1, BARD1, BMPR1A, BRCA1, BRCA2, BRIP1, CDH1, CDK4, CDKN2A, CHEK2, CTNNA1, DICER1, EPCAM, FH, GREM1, HOXB13, KIT, MEN1, MLH1, MSH2, MSH3, MSH6, MUTYH, NF1, NTHL1, PALB2, PDGFRA, PMS2, POLD1, POLE, PTEN, RAD51C, RAD51D, SDHA, SDHB, SDHC, SDHD, SMAD4, SMARCA4, STK11, TP53, TSC1, TSC2, VHL.     04/2023 -  Chemotherapy   Started Xtandi 160 mg.   06/29/2023 - 06/29/2023 Chemotherapy   Patient is on Treatment Plan : PROSTATE Docetaxel  (50) q14d        PHYSICAL EXAMINATION: ECOG PERFORMANCE STATUS: {CHL ONC ECOG PS:[phone]}  There were no vitals filed for this visit. There were no vitals filed for this visit.  GENERAL: alert, no distress and comfortable SKIN: skin color normal and no jaundice or bruising or petechiae on exposed skin EYES:  normal, sclera clear OROPHARYNX: no exudate  NECK: No palpable mass LYMPH:  no palpable cervical, axillary lymphadenopathy   LUNGS: clear to auscultation and no wheeze or rales with normal breathing effort HEART: regular rate & rhythm  ABDOMEN: abdomen soft, non-tender and nondistended. Musculoskeletal: no edema NEURO: no focal motor/sensory deficits  Relevant data reviewed during this visit included labs.  New labs ordered.

## 2024-07-27 NOTE — Assessment & Plan Note (Addendum)
 baseline bone mineral density: 04/06/2023 Right Femur Neck  T= -1.8 osteopenia  calcium  and vitamin D3. Refilled today. prevent diabetes He has no teeth. He get Xgeva  at [facility] monthly Recommend exercises and activities as able Limit alcohol consumption and avoid smoking Monitoring control blood pressure

## 2024-07-28 ENCOUNTER — Inpatient Hospital Stay

## 2024-07-28 ENCOUNTER — Encounter: Payer: Self-pay | Admitting: Nurse Practitioner

## 2024-07-28 ENCOUNTER — Inpatient Hospital Stay: Admitting: Nurse Practitioner

## 2024-07-28 VITALS — BP 154/84 | HR 86 | Temp 98.1°F | Resp 17 | Wt 209.4 lb

## 2024-07-28 DIAGNOSIS — R530 Neoplastic (malignant) related fatigue: Secondary | ICD-10-CM

## 2024-07-28 DIAGNOSIS — K5900 Constipation, unspecified: Secondary | ICD-10-CM | POA: Diagnosis not present

## 2024-07-28 DIAGNOSIS — Z923 Personal history of irradiation: Secondary | ICD-10-CM | POA: Insufficient documentation

## 2024-07-28 DIAGNOSIS — Z87891 Personal history of nicotine dependence: Secondary | ICD-10-CM | POA: Diagnosis not present

## 2024-07-28 DIAGNOSIS — R5383 Other fatigue: Secondary | ICD-10-CM | POA: Diagnosis not present

## 2024-07-28 DIAGNOSIS — Z9189 Other specified personal risk factors, not elsewhere classified: Secondary | ICD-10-CM

## 2024-07-28 DIAGNOSIS — G4701 Insomnia due to medical condition: Secondary | ICD-10-CM | POA: Diagnosis not present

## 2024-07-28 DIAGNOSIS — C7800 Secondary malignant neoplasm of unspecified lung: Secondary | ICD-10-CM | POA: Insufficient documentation

## 2024-07-28 DIAGNOSIS — Z79899 Other long term (current) drug therapy: Secondary | ICD-10-CM | POA: Diagnosis not present

## 2024-07-28 DIAGNOSIS — Z9079 Acquired absence of other genital organ(s): Secondary | ICD-10-CM | POA: Diagnosis not present

## 2024-07-28 DIAGNOSIS — C61 Malignant neoplasm of prostate: Secondary | ICD-10-CM

## 2024-07-28 DIAGNOSIS — R351 Nocturia: Secondary | ICD-10-CM | POA: Diagnosis not present

## 2024-07-28 DIAGNOSIS — M85851 Other specified disorders of bone density and structure, right thigh: Secondary | ICD-10-CM | POA: Insufficient documentation

## 2024-07-28 DIAGNOSIS — I7143 Infrarenal abdominal aortic aneurysm, without rupture: Secondary | ICD-10-CM | POA: Insufficient documentation

## 2024-07-28 DIAGNOSIS — N3000 Acute cystitis without hematuria: Secondary | ICD-10-CM | POA: Diagnosis not present

## 2024-07-28 DIAGNOSIS — D649 Anemia, unspecified: Secondary | ICD-10-CM

## 2024-07-28 DIAGNOSIS — R197 Diarrhea, unspecified: Secondary | ICD-10-CM | POA: Diagnosis not present

## 2024-07-28 DIAGNOSIS — C7951 Secondary malignant neoplasm of bone: Secondary | ICD-10-CM | POA: Insufficient documentation

## 2024-07-28 DIAGNOSIS — N3020 Other chronic cystitis without hematuria: Secondary | ICD-10-CM | POA: Insufficient documentation

## 2024-07-28 DIAGNOSIS — Z515 Encounter for palliative care: Secondary | ICD-10-CM

## 2024-07-28 LAB — CMP (CANCER CENTER ONLY)
ALT: 20 U/L (ref 0–44)
AST: 29 U/L (ref 15–41)
Albumin: 4.3 g/dL (ref 3.5–5.0)
Alkaline Phosphatase: 97 U/L (ref 38–126)
Anion gap: 12 (ref 5–15)
BUN: 17 mg/dL (ref 8–23)
CO2: 23 mmol/L (ref 22–32)
Calcium: 9 mg/dL (ref 8.9–10.3)
Chloride: 102 mmol/L (ref 98–111)
Creatinine: 0.71 mg/dL (ref 0.61–1.24)
GFR, Estimated: >60 mL/min
Glucose, Bld: 154 mg/dL — ABNORMAL HIGH (ref 70–99)
Potassium: 3.6 mmol/L (ref 3.5–5.1)
Sodium: 138 mmol/L (ref 135–145)
Total Bilirubin: 0.3 mg/dL (ref 0.0–1.2)
Total Protein: 7.7 g/dL (ref 6.5–8.1)

## 2024-07-28 LAB — CBC WITH DIFFERENTIAL (CANCER CENTER ONLY)
Abs Immature Granulocytes: 0.04 K/uL (ref 0.00–0.07)
Basophils Absolute: 0 K/uL (ref 0.0–0.1)
Basophils Relative: 1 %
Eosinophils Absolute: 0.1 K/uL (ref 0.0–0.5)
Eosinophils Relative: 3 %
HCT: 36.1 % — ABNORMAL LOW (ref 39.0–52.0)
Hemoglobin: 12.3 g/dL — ABNORMAL LOW (ref 13.0–17.0)
Immature Granulocytes: 1 %
Lymphocytes Relative: 20 %
Lymphs Abs: 0.8 K/uL (ref 0.7–4.0)
MCH: 33.2 pg (ref 26.0–34.0)
MCHC: 34.1 g/dL (ref 30.0–36.0)
MCV: 97.3 fL (ref 80.0–100.0)
Monocytes Absolute: 0.4 K/uL (ref 0.1–1.0)
Monocytes Relative: 10 %
Neutro Abs: 2.6 K/uL (ref 1.7–7.7)
Neutrophils Relative %: 65 %
Platelet Count: 226 K/uL (ref 150–400)
RBC: 3.71 MIL/uL — ABNORMAL LOW (ref 4.22–5.81)
RDW: 14.3 % (ref 11.5–15.5)
WBC Count: 4 K/uL (ref 4.0–10.5)
nRBC: 0 % (ref 0.0–0.2)

## 2024-07-28 LAB — SAMPLE TO BLOOD BANK

## 2024-07-28 LAB — PSA: Prostatic Specific Antigen: 26 ng/mL — ABNORMAL HIGH (ref 0.00–4.00)

## 2024-07-28 LAB — ABO/RH: ABO/RH(D): A NEG

## 2024-07-28 MED ORDER — SULFAMETHOXAZOLE-TRIMETHOPRIM 800-160 MG PO TABS: 1.0000 | ORAL_TABLET | Freq: Two times a day (BID) | ORAL | 0 refills | Status: DC

## 2024-07-28 NOTE — Progress Notes (Signed)
 "    Palliative Medicine [facility]  [phone] [phone]   Name: David [name]. Date: 07/28/2024 MRN: [id number]  DOB: [birth year]  Patient Care Team: Gil Greig BRAVO, NP as PCP - General (Adult Health Nurse Practitioner) Charmayne Molly, MD as Consulting Physician (Ophthalmology) Gil Greig BRAVO, NP as Nurse Practitioner (Adult Health Nurse Practitioner) Vertell Pont, RN as Oncology Nurse Navigator Pickenpack-Cousar, Fannie SAILOR, NP as Nurse Practitioner [facility] and Palliative Medicine)    INTERVAL HISTORY: David [name]. is a 79 y.o. male with oncologic medical history including metastatic prostate cancer with lymph node, lung, and bone involvement.  Palliative is seeing patient for symptom management and goals of care.   SOCIAL HISTORY:     reports that he quit smoking about 9 years ago. His smoking use included cigarettes. He started smoking about 64 years ago. He has a 82.5 pack-year smoking history. He has never used smokeless tobacco. He reports that he does not drink alcohol and does not use drugs.  [facility] DIRECTIVES:  None on file   CODE STATUS: Full code  PAST MEDICAL HISTORY: Past Medical History:  Diagnosis Date   AAA (abdominal aortic aneurysm)    Allergic rhinitis due to pollen    Anxiety state, unspecified    Asymptomatic varicose veins    Atherosclerosis of native arteries of the extremities, unspecified    Cataract 10/17/2016   L Eye   Contact dermatitis and other eczema due to other chemical products    Diaphragmatic hernia without mention of obstruction or gangrene    Elevated PSA    Flatulence, eructation, and gas pain    Headache(784.0)    Hemorrhoids, external    Herpes zoster with unspecified complication    Hyperpotassemia    Insomnia, unspecified    Lumbago    Lumbago    Myalgia and myositis, unspecified    Nocturia    Obesity, unspecified    Osteoarthrosis, unspecified whether generalized or  localized, unspecified site    Other abnormal blood chemistry    Other headache syndromes(339.89)    Pre-diabetes    Pure hyperglyceridemia    Rash and other nonspecific skin eruption    Rotator cuff (capsule) sprain    Routine general medical examination at a health care facility    Tobacco use disorder    Unspecified essential hypertension    Unspecified hypertrophic and atrophic condition of skin    Unspecified sleep apnea     ALLERGIES:  is allergic to celebrex [celecoxib], erythromycin, oxycontin  [oxycodone  hcl], penicillins, tetracyclines & related, and tape.  MEDICATIONS:  Current Outpatient Medications  Medication Sig Dispense Refill   acetaminophen  (TYLENOL ) 325 MG tablet Take 650 mg by mouth every 6 (six) hours as needed (pain and sleep aid).     aspirin  EC 81 MG tablet Take 1 tablet (81 mg total) by mouth daily at 6 (six) AM. Swallow whole. 30 tablet 12   calcium -vitamin D (OSCAL WITH D) 500-5 MG-MCG tablet Take 1 tablet by mouth 2 (two) times daily. 180 tablet 1   cetirizine  (ZYRTEC ) 10 MG tablet Take 1 tablet (10 mg total) by mouth daily. 30 tablet 11   clopidogrel  (PLAVIX ) 75 MG tablet Take 1 tablet (75 mg total) by mouth daily. 30 tablet 11   darolutamide  (NUBEQA ) 300 MG tablet Take 2 tablets (600 mg total) by mouth 2 (two) times daily with a meal. 120 tablet 11   finasteride  (PROSCAR ) 5 MG tablet Take 1 tablet (5 mg  total) by mouth daily. 90 tablet 2   losartan  (COZAAR ) 100 MG tablet TAKE 1 TABLET BY MOUTH DAILY 90 tablet 3   ondansetron  (ZOFRAN ) 4 MG tablet Take 1 tablet (4 mg total) by mouth every 8 (eight) hours as needed for nausea or vomiting. 20 tablet 0   silodosin  (RAPAFLO ) 8 MG CAPS capsule Take 1 capsule (8 mg total) by mouth daily with breakfast. 30 capsule 6   simvastatin  (ZOCOR ) 20 MG tablet TAKE 1 TABLET(20 MG) BY MOUTH DAILY AT 6 PM 90 tablet 1   sulfamethoxazole -trimethoprim  (BACTRIM  DS) 800-160 MG tablet Take 1 tablet by mouth 2 (two) times daily. 14  tablet 0   traZODone  (DESYREL ) 150 MG tablet Take 1 tablet (150 mg total) by mouth at bedtime. 90 tablet 2   No current facility-administered medications for this visit.    VITAL SIGNS: There were no vitals taken for this visit. There were no vitals filed for this visit.   Estimated body mass index is 30.93 kg/m as calculated from the following:   Height as of 05/16/24: 5' 9 (1.753 m).   Weight as of an earlier encounter on 07/28/24: 209 lb 7 oz (95 kg).   PERFORMANCE STATUS (ECOG) : 1 - Symptomatic but completely ambulatory  Physical Exam General: NAD Cardiovascular: regular rate and rhythm Pulmonary: normal breathing pattern  Extremities: no edema, no joint deformities Skin: no rashes Neurological: AAOX 4  IMPRESSION: Discussed the use of AI scribe software for clinical note transcription with the patient, who gave verbal consent to proceed.  History of Present Illness Lauri Till. is a 79 year old male with metastatic prostate cancer who presents for a follow-up visit. No family present. He states he is doing well overall. Occasional fatigue however remains active.   Appetite is good. Some days are better than others. Weight today 209.7lbs. No significant issues with constipation, or diarrhea, and he hasn't needed to take anything for constipation in a couple of months indicating improved bowel pattern.   He experiences sleep disturbances due to the need to get up three to four times a night to drain his catheter, which holds 900 mL. This frequent interruption causes him to feel tired in the morning. He is taking trazodone  75 mg at bedtime to aid sleep but is cautious about taking a full dose due to the risk of the catheter bag overfilling.   He experiences a burning sensation approximately once a month and has a prescription for Bactrim  DS, one tablet twice a day. He prefers paper prescriptions to ensure timely access to medication.  All symptoms are well managed. No  unmet palliative needs at this time.   All questions answered and support provided.  Assessment & Plan Prostate cancer Prostate cancer is being actively managed with Plavicto treatment per Dr. 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 Current catheter bag is [id number] mL, which is insufficient for overnight use. He declines a larger bag due to discomfort with the size and tubing. - Continue current catheter management with [id number] mL bag.  Pain  Intermittent burning sensation occurring approximately once a month. Pain management includes Tylenol  as needed, with caution due to concurrent use of enteric aspirin  and Plavix . - Continue Tylenol  as needed for mild pain.  Chronic insomnia Related to nocturia and catheter management. Currently managed with trazodone  75 mg  at bedtime, which is effective but requires careful monitoring due to concurrent medications. - Continue trazodone  75 mg at bedtime.  Constipation Well-managed with no recent need for medication. Occasional diarrhea reported but not requiring intervention. - Continue current management without additional medication.  I will plan to see patient back in 6-8 weeks. Sooner if needed.   Patient expressed understanding and was in agreement with this plan. He also understands that He can call the clinic at any time with any questions, concerns, or complaints.   Any controlled substances utilized were prescribed in the context of palliative care. PDMP has been reviewed.   I personally spent a total of 35 minutes in the care of the patient today including preparing to see the patient, getting/reviewing separately obtained history, performing a medically appropriate exam/evaluation, counseling and educating, and documenting clinical information in the  EHR. Visit consisted of counseling and education dealing with the complex and emotionally intense issues of symptom management and palliative care in the setting of serious and potentially life-threatening illness.  Levon Borer, AGPCNP-BC  Palliative Medicine Team/[facility]    "

## 2024-07-28 NOTE — Assessment & Plan Note (Addendum)
 Symptoms of burning sensation that is recurring with previous UTI with a catheter Bactrim  DS twice daily for 7 days Follow-up with urology as scheduled next week.

## 2024-07-29 LAB — TESTOSTERONE: Testosterone: 13 ng/dL — ABNORMAL LOW (ref 264–916)

## 2024-07-30 ENCOUNTER — Telehealth: Payer: Self-pay | Admitting: *Deleted

## 2024-07-30 NOTE — Written Directive (Cosign Needed)
" °  PLUVICTO   THERAPY   RADIOPHARMACEUTICAL: Lutetium 177 vipivotide tetraxetan (Pluvicto )     PRESCRIBED DOSE FOR ADMINISTRATION:  200 mCi   ROUTE OFADMINISTRATION:  IV   DIAGNOSIS:  Prostate Cancer Metastatic to bone   REFERRING PHYSICIAN: Pauletta Chihuahua, MD   TREATMENT #:3   ADDITIONAL PHYSICIAN COMMENTS/NOTES:   AUTHORIZED USER SIGNATURE & TIME STAMP: Norleen GORMAN Boxer, MD   07/31/24    8:11 AM   "

## 2024-07-30 NOTE — Telephone Encounter (Signed)
 Received PC from this patient, he states he began noticing bloody urine in his catheter bag last night & that has continued today. [facility] maintains his catheter. He is asking if he should stop the Bactrim  that Dr Tina recently prescribed for him.  Patient denies pain or fever, advised him to continue taking the bactrim  & to contact [facility] about his hematuria.  Patient states he cannot get in touch with anyone & can't get through when calling his urologist, he is asking if I can call for him.  PC to [facility], triage RN informed of patient's complaint, she states she will contact the patient.

## 2024-07-31 ENCOUNTER — Ambulatory Visit (HOSPITAL_COMMUNITY)
Admission: RE | Admit: 2024-07-31 | Discharge: 2024-07-31 | Disposition: A | Discharge status: Home / Self Care | Source: Ambulatory Visit

## 2024-07-31 DIAGNOSIS — C61 Malignant neoplasm of prostate: Secondary | ICD-10-CM | POA: Insufficient documentation

## 2024-07-31 DIAGNOSIS — C7951 Secondary malignant neoplasm of bone: Secondary | ICD-10-CM | POA: Insufficient documentation

## 2024-07-31 MED ORDER — SODIUM CHLORIDE 0.9 % IV BOLUS: 1000.0000 mL | Freq: Once | INTRAVENOUS | Status: DC

## 2024-07-31 MED ORDER — LUTETIUM LU 177 VIPIVOTIDE TET 1000 MBQ/ML IV SOLN
205.0000 | Freq: Once | INTRAVENOUS | Status: AC
  Administered 2024-07-31: 205 via INTRAVENOUS

## 2024-07-31 NOTE — Progress Notes (Signed)
 EXAM: NUCLEAR MEDICINE PLUVICTO  INJECTION 07/31/2024 09:56:20 AM TECHNIQUE: Patient presented to nuclear medicine for treatment of metastatic prostate cancer. PSMA PET scan was reviewed. The patient's most recent labs were reviewed and it was determined to proceed with Lu-177 Pluvicto . Written informed consent was obtained from the patient. Written instructions for radiation safety were provided. The radiopharmaceutical was injected intravenously according to local protocol. Treatment number 3 of 6. Patient reports no adverse interval effects other than recent hematuria. Patient has a chronic Foley catheter. Oncology and urology are aware of hematuria. Patient's CBC and renal function are normal. PSA has elevated to 26 from 14.49. This will be monitored after the 3rd treatment. RADIOPHARMACEUTICAL: 204.8 mCi Lu-177 PLUVICTO  COMPARISON: None available. CLINICAL HISTORY: prostate cancer mets to bone Prostate cancer with metastases to bone. FINDINGS: Current Infusion: 3 Planned Infusions: 6 IMPRESSION: Treatment 3 of 6 planned Lu-177 Pluvicto  treatments. Patient tolerated the infusion well and will return in 6 weeks for the next planned infusion. Recent hematuria; oncology and urology are aware. PSA increased to 26 from 14.49 and will be monitored.

## 2024-07-31 NOTE — Progress Notes (Signed)
 Tolerated Pluvicto  well.  Called Urologu they are to call patient around 11am to discuss the blood in his urine.  Pt informed to home phone

## 2024-08-07 ENCOUNTER — Other Ambulatory Visit: Payer: Self-pay | Admitting: Urology

## 2024-08-13 NOTE — Patient Instructions (Signed)
 SURGICAL WAITING ROOM VISITATION  Patients having surgery or a procedure may have no more than 2 support people in the waiting area - these visitors may rotate.    Children ages 32 and under will not be able to visit patients in [facility] under most circumstances.   Visitors with respiratory illnesses are discouraged from visiting and should remain at home.  If the patient needs to stay at the hospital during part of their recovery, the visitor guidelines for inpatient rooms apply. Pre-op nurse will coordinate an appropriate time for 1 support person to accompany patient in pre-op.  This support person may not rotate.    Please refer to the [facility] website for the visitor guidelines for Inpatients (after your surgery is over and you are in a regular room).    Your procedure is scheduled on: 08/18/24   Report to [facility] Main Entrance    Report to admitting at 12:15 PM   Call this number if you have problems the morning of surgery [phone]   Do not eat food or drink liquids :After Midnight.          If you have questions, please contact your surgeons office.   FOLLOW BOWEL PREP AND ANY ADDITIONAL PRE OP INSTRUCTIONS YOU RECEIVED FROM YOUR SURGEON'S OFFICE!!!     Oral Hygiene is also important to reduce your risk of infection.                                    Remember - BRUSH YOUR TEETH THE MORNING OF SURGERY WITH YOUR REGULAR TOOTHPASTE  DENTURES WILL BE REMOVED PRIOR TO SURGERY PLEASE DO NOT APPLY Poly grip OR ADHESIVES!!!   Stop all vitamins and herbal supplements 7 days before surgery.   Take these medicines the morning of surgery with A SIP OF WATER :  Tylenol  Finasteride  (Proscar ) Ondansetron  (Zofran )             You may not have any metal on your body including jewelry, and body piercing             Do not wear lotions, powders, cologne, or deodorant              Men may shave face and neck.   Do not bring valuables to the hospital.  [place] IS NOT             RESPONSIBLE   FOR VALUABLES.   Contacts, glasses, dentures or bridgework may not be worn into surgery.  DO NOT BRING YOUR HOME MEDICATIONS TO THE HOSPITAL. PHARMACY WILL DISPENSE MEDICATIONS LISTED ON YOUR MEDICATION LIST TO YOU DURING YOUR ADMISSION IN THE HOSPITAL!    Patients discharged on the day of surgery will not be allowed to drive home.  Someone NEEDS to stay with you for the first 24 hours after anesthesia.              Please read over the following fact sheets you were given: IF YOU HAVE QUESTIONS ABOUT YOUR PRE-OP INSTRUCTIONS PLEASE CALL [phone] [place].   If you received a COVID test during your pre-op visit  it is requested that you wear a mask when out in public, stay away from anyone that may not be feeling well and notify your surgeon if you develop symptoms. If you test positive for Covid or have been in contact with anyone that has tested positive in the last  10 days please notify you surgeon.    [place] - Preparing for Surgery Before surgery, you can play an important role.  Because skin is not sterile, your skin needs to be as free of germs as possible.  You can reduce the number of germs on your skin by washing with CHG (chlorahexidine gluconate) soap before surgery.  CHG is an antiseptic cleaner which kills germs and bonds with the skin to continue killing germs even after washing. Please DO NOT use if you have an allergy to CHG or antibacterial soaps.  If your skin becomes reddened/irritated stop using the CHG and inform your nurse when you arrive at Short Stay. Do not shave (including legs and underarms) for at least 48 hours prior to the first CHG shower.  You may shave your face/neck.  Please follow these instructions carefully:  1.  Shower with CHG Soap the night before surgery and the morning of surgery.  2.  If you choose to wash your hair, wash your hair first as usual with your normal  shampoo.  3.  After you shampoo,  rinse your hair and body thoroughly to remove the shampoo.                             4.  Use CHG as you would any other liquid soap.  You can apply chg directly to the skin and wash.  Gently with a scrungie or clean washcloth.  5.  Apply the CHG Soap to your body ONLY FROM THE NECK DOWN.   Do   not use on face/ open                           Wound or open sores. Avoid contact with eyes, ears mouth and   genitals (private parts).                       Wash face,  Genitals (private parts) with your normal soap.             6.  Wash thoroughly, paying special attention to the area where your    surgery  will be performed.  7.  Thoroughly rinse your body with warm water  from the neck down.  8.  DO NOT shower/wash with your normal soap after using and rinsing off the CHG Soap.                9.  Pat yourself dry with a clean towel.            10.  Wear clean pajamas.            11.  Place clean sheets on your bed the night of your first shower and do not  sleep with pets. Day of Surgery : Do not apply any lotions/deodorants the morning of surgery.  Please wear clean clothes to the hospital/surgery center.  FAILURE TO FOLLOW THESE INSTRUCTIONS MAY RESULT IN THE CANCELLATION OF YOUR SURGERY  PATIENT SIGNATURE_________________________________  NURSE SIGNATURE__________________________________  ________________________________________________________________________

## 2024-08-13 NOTE — Progress Notes (Signed)
 Date of COVID positive in last 90 days:  PCP - Greig Cluster, NP Cardiologist - n/a Oncology- Pauletta Chihuahua, MD  PET- 03/14/24 Epic Chest x-ray - 03/20/24 Epic EKG - 03/06/24 Epic Stress Test - N/A ECHO - N/A Cardiac Cath - N/A Pacemaker/ICD device last checked:N/A Spinal Cord Stimulator:N/A  Bowel Prep - N/A  Sleep Study - N/A CPAP -   Fasting Blood Sugar - preDM Checks Blood Sugar _____ times a day  Last dose of GLP1 agonist-  N/A GLP1 instructions:  Do not take after     Last dose of SGLT-2 inhibitors-  N/A SGLT-2 instructions:  Do not take after     Blood Thinner Instructions: Plavix , 08/11/24 1030 Aspirin  Instructions: ASA 81 Last Dose:  Activity level: Can go up a flight of stairs and perform activities of daily living without stopping and without symptoms of chest pain or shortness of breath.  Anesthesia review: HTN, AAA, anemia, sleep apnea, metastatic prostate cancer   Patient denies shortness of breath, fever, cough and chest pain at PAT appointment  Patient verbalized understanding of instructions that were given to them at the PAT appointment. Patient was also instructed that they will need to review over the PAT instructions again at home before surgery.

## 2024-08-14 ENCOUNTER — Other Ambulatory Visit: Payer: Self-pay

## 2024-08-14 ENCOUNTER — Encounter (HOSPITAL_COMMUNITY): Payer: Self-pay

## 2024-08-14 ENCOUNTER — Encounter (HOSPITAL_COMMUNITY): Admission: RE | Admit: 2024-08-14 | Discharge: 2024-08-14 | Attending: Urology

## 2024-08-14 HISTORY — DX: Claustrophobia: F40.240

## 2024-08-14 HISTORY — DX: Respiratory tuberculosis unspecified: A15.9

## 2024-08-15 NOTE — Progress Notes (Signed)
 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  Hx of AAA and a right common iliac artery aneurysm s/p  2 vessel fenestrated endovascular aortic repair with a iliac branch device on 03/20/2024 with Dr. Pearline.  Had follow up appointment and CTA on 04/14/24. Per Dr Pearline: The CTA today demonstrates widely patent visceral segment and excellent seal without evidence of a type I or III endoleak.  There may be a small type II endoleak from the IMA and lumbar arteries.  The excluded aneurysms are similar in size compared to preop. Will plan for follow-up in 6 months with a repeat CTA abdomen pelvis.  He has stage IV prostrate cancer. Followed by Oncology and Palliative care. Last seen by Dr. Tina with Oncology on 07/28/24. On ADT, Xgeva , darolutamide , pluvicto . Seen by Palliative care on 07/28/24 as well. Has a chronic foley which causes discomfort. All other issues stable.  LD Plavix : 2/2  VS: BP (!) 149/70   Pulse 96   Temp 37 C (Oral)   Resp 16   Ht 5' 9 (1.753 m)   Wt 95 kg   SpO2 97%   BMI 30.93 kg/m   PROVIDERS: [place], Amy E, NP   LABS: Labs reviewed: Acceptable for surgery. Stable anemia (all labs ordered are listed, but only abnormal results are displayed)  Labs Reviewed - No data to display   CTA Abdomen/Pelvis 04/14/24:   IMPRESSION: 1. Interval placement of an endovascular stent graft  with fenestrations for treatment of an abdominal aortic aneurysm and right common iliac artery aneurysm. A type 2 endoleak is present within the abdominal component which is facilitated by the IMA and lumbar arteries. The abdominal aortic aneurysm is estimated at 5.1 x 5.1 cm using 3D measurement techniques which serves as the new baseline. 2. Post treatment changes in the right common iliac artery without evidence of endoleak at that site. The right common iliac artery measures 4.2 cm. 3. Low-attenuation densities are present in the liver which are of uncertain significance and detailed above. Recommend continued attention on imaging follow-up. 4. Similar appearance of bony metastatic disease.   EKG: 03/06/2024: Sinus rhythm with Premature atrial complexes with Abberant conduction Otherwise normal ECG When compared with ECG of 22-Aug-2022 09:13, No significant change since last tracing Confirmed by Croitoru, Mihai [phone]) on 03/06/2024 9:19:05 PM     CV: N/A  Past Medical History:  Diagnosis Date   AAA (abdominal aortic aneurysm)    Allergic rhinitis due to pollen    Anxiety state, unspecified    Asymptomatic varicose veins    Atherosclerosis of native arteries of the extremities, unspecified    Cataract 10/17/2016   L Eye   Claustrophobia    Contact dermatitis and other eczema due to other chemical products    Diaphragmatic hernia without mention of obstruction or gangrene    Elevated PSA    Flatulence, eructation, and gas pain  Headache(784.0)    Hemorrhoids, external    Herpes zoster with unspecified complication    Hyperpotassemia    Insomnia, unspecified    Lumbago    Lumbago    Myalgia and myositis, unspecified    Nocturia    Obesity, unspecified    Osteoarthrosis, unspecified whether generalized or localized, unspecified site    Other abnormal blood chemistry    Other headache syndromes(339.89)    Pre-diabetes    Pure hyperglyceridemia    Rash and other  nonspecific skin eruption    Rotator cuff (capsule) sprain    Routine general medical examination at a health care facility    Tobacco use disorder    Tuberculosis    in [employer], was treated   Unspecified essential hypertension    Unspecified hypertrophic and atrophic condition of skin    Unspecified sleep apnea     Past Surgical History:  Procedure Laterality Date   ABDOMINAL AORTIC ENDOVASCULAR FENESTRATED STENT GRAFT Bilateral 03/20/2024   Procedure: ABDOMINAL AORTIC ENDOVASCULAR FENESTRATED STENT GRAFT, Stenting of Right Internal Iliac Artery,;  Surgeon: Pearline Norman RAMAN, MD;  Location: [facility];  Service: Vascular;  Laterality: Bilateral;   CYSTOSCOPY N/A 08/22/2022   Procedure: PHYLLIS;  Surgeon: Rosalind Zachary NOVAK, MD;  Location: [facility];  Service: Urology;  Laterality: N/A;   DENTAL SURGERY  05/10/2021   Per patient   HEMORRHOID SURGERY     INSERTION OF ILIAC STENT Bilateral 03/20/2024   Procedure: INSERTION, STENT, ARTERY, ILIAC;  Surgeon: Pearline Norman RAMAN, MD;  Location: [facility];  Service: Vascular;  Laterality: Bilateral;   PROSTATE BIOPSY     TRANSURETHRAL RESECTION OF PROSTATE N/A 08/22/2022   Procedure: TRANSURETHRAL RESECTION OF THE PROSTATE (TURP);  Surgeon: Rosalind Zachary NOVAK, MD;  Location[facility];  Service: Urology;  Laterality: N/A;   tumorectomy Left    Removed tumor from his chest.   VARICOSE VEIN SURGERY      MEDICATIONS:  acetaminophen  (TYLENOL ) 500 MG tablet   aspirin  EC 81 MG tablet   calcium -vitamin D (OSCAL WITH D) 500-5 MG-MCG tablet   cetirizine  (ZYRTEC ) 10 MG tablet   clopidogrel  (PLAVIX ) 75 MG tablet   darolutamide  (NUBEQA ) 300 MG tablet   finasteride  (PROSCAR ) 5 MG tablet   losartan  (COZAAR ) 100 MG tablet   Multiple Vitamins-Minerals (MULTIVITAMIN WITH MINERALS) tablet   ondansetron  (ZOFRAN ) 4 MG tablet   silodosin  (RAPAFLO ) 8 MG CAPS capsule   simvastatin  (ZOCOR ) 20 MG tablet   sulfamethoxazole -trimethoprim   (BACTRIM  DS) 800-160 MG tablet   traZODone  (DESYREL ) 150 MG tablet   No current facility-administered medications for this encounter.   Burnard CHRISTELLA Odis DEVONNA [facility] Pre-Surgical Testing [facility] Phone [phone] 08/15/2024 9:11 AM        "

## 2024-08-15 NOTE — Anesthesia Preprocedure Evaluation (Signed)
"                                    Anesthesia Evaluation    Airway        Dental   Pulmonary former smoker          Cardiovascular hypertension,      Neuro/Psych    GI/Hepatic   Endo/Other    Renal/GU      Musculoskeletal   Abdominal   Peds  Hematology   Anesthesia Other Findings   Reproductive/Obstetrics                              Anesthesia Physical Anesthesia Plan  ASA:   Anesthesia Plan:    Post-op Pain Management:    Induction:   PONV Risk Score and Plan:   Airway Management Planned:   Additional Equipment:   Intra-op Plan:   Post-operative Plan:   Informed Consent:   Plan Discussed with:   Anesthesia Plan Comments: (See PAT note from 2/5)         Anesthesia Quick Evaluation  "

## 2024-08-18 ENCOUNTER — Ambulatory Visit (HOSPITAL_COMMUNITY): Admission: RE | Admit: 2024-08-18 | Source: Home / Self Care | Admitting: Urology

## 2024-08-18 ENCOUNTER — Encounter (HOSPITAL_COMMUNITY): Payer: Self-pay | Admitting: Medical

## 2024-08-18 ENCOUNTER — Encounter (HOSPITAL_COMMUNITY): Admission: RE | Payer: Self-pay | Source: Home / Self Care

## 2024-09-08 ENCOUNTER — Inpatient Hospital Stay

## 2024-09-11 ENCOUNTER — Other Ambulatory Visit (HOSPITAL_COMMUNITY)

## 2024-10-23 ENCOUNTER — Other Ambulatory Visit (HOSPITAL_COMMUNITY)

## 2024-10-24 ENCOUNTER — Ambulatory Visit: Admitting: Vascular Surgery

## 2024-10-30 ENCOUNTER — Ambulatory Visit: Payer: Self-pay | Admitting: Orthopedic Surgery

## 2024-11-13 ENCOUNTER — Ambulatory Visit: Payer: Self-pay | Admitting: Orthopedic Surgery

## 2024-12-04 ENCOUNTER — Encounter (HOSPITAL_COMMUNITY)

## 2024-12-18 ENCOUNTER — Encounter (HOSPITAL_COMMUNITY)
